# Patient Record
Sex: Female | Born: 1940 | Race: White | Hispanic: No | Smoking: Former smoker
Health system: Southern US, Community
[De-identification: ages and names within clinical notes are randomized; demographics above are authoritative.]

## PROBLEM LIST (undated history)

## (undated) DIAGNOSIS — D696 Thrombocytopenia, unspecified: Secondary | ICD-10-CM

## (undated) DIAGNOSIS — R0989 Other specified symptoms and signs involving the circulatory and respiratory systems: Secondary | ICD-10-CM

## (undated) DIAGNOSIS — B353 Tinea pedis: Secondary | ICD-10-CM

## (undated) DIAGNOSIS — L408 Other psoriasis: Secondary | ICD-10-CM

## (undated) DIAGNOSIS — B354 Tinea corporis: Secondary | ICD-10-CM

## (undated) DIAGNOSIS — R7989 Other specified abnormal findings of blood chemistry: Secondary | ICD-10-CM

## (undated) DIAGNOSIS — J029 Acute pharyngitis, unspecified: Secondary | ICD-10-CM

## (undated) DIAGNOSIS — H9190 Unspecified hearing loss, unspecified ear: Secondary | ICD-10-CM

## (undated) DIAGNOSIS — K5732 Diverticulitis of large intestine without perforation or abscess without bleeding: Secondary | ICD-10-CM

## (undated) DIAGNOSIS — M1990 Unspecified osteoarthritis, unspecified site: Secondary | ICD-10-CM

## (undated) DIAGNOSIS — R1011 Right upper quadrant pain: Secondary | ICD-10-CM

## (undated) DIAGNOSIS — R945 Abnormal results of liver function studies: Secondary | ICD-10-CM

## (undated) DIAGNOSIS — M7660 Achilles tendinitis, unspecified leg: Secondary | ICD-10-CM

## (undated) DIAGNOSIS — E119 Type 2 diabetes mellitus without complications: Secondary | ICD-10-CM

## (undated) DIAGNOSIS — J0100 Acute maxillary sinusitis, unspecified: Secondary | ICD-10-CM

## (undated) DIAGNOSIS — IMO0002 Reserved for concepts with insufficient information to code with codable children: Secondary | ICD-10-CM

## (undated) DIAGNOSIS — I6529 Occlusion and stenosis of unspecified carotid artery: Secondary | ICD-10-CM

## (undated) DIAGNOSIS — E7800 Pure hypercholesterolemia, unspecified: Secondary | ICD-10-CM

## (undated) DIAGNOSIS — M62830 Muscle spasm of back: Secondary | ICD-10-CM

## (undated) DIAGNOSIS — M75100 Unspecified rotator cuff tear or rupture of unspecified shoulder, not specified as traumatic: Secondary | ICD-10-CM

## (undated) DIAGNOSIS — R51 Headache: Secondary | ICD-10-CM

## (undated) DIAGNOSIS — J329 Chronic sinusitis, unspecified: Secondary | ICD-10-CM

## (undated) DIAGNOSIS — I10 Essential (primary) hypertension: Secondary | ICD-10-CM

## (undated) DIAGNOSIS — R0609 Other forms of dyspnea: Secondary | ICD-10-CM

## (undated) DIAGNOSIS — R609 Edema, unspecified: Secondary | ICD-10-CM

## (undated) DIAGNOSIS — D649 Anemia, unspecified: Secondary | ICD-10-CM

## (undated) DIAGNOSIS — B372 Candidiasis of skin and nail: Secondary | ICD-10-CM

## (undated) HISTORY — DX: Unspecified rotator cuff tear or rupture of unspecified shoulder, not specified as traumatic: M75.100

## (undated) HISTORY — DX: Chronic sinusitis, unspecified: J32.9

## (undated) HISTORY — DX: Anemia, unspecified: D64.9

## (undated) HISTORY — DX: Acute maxillary sinusitis, unspecified: J01.00

## (undated) HISTORY — DX: Thrombocytopenia, unspecified: D69.6

## (undated) HISTORY — DX: Other specified symptoms and signs involving the circulatory and respiratory systems: R09.89

## (undated) HISTORY — DX: Other forms of dyspnea: R06.09

## (undated) HISTORY — DX: Headache: R51

## (undated) HISTORY — DX: Reserved for concepts with insufficient information to code with codable children: IMO0002

## (undated) HISTORY — DX: Candidiasis of skin and nail: B37.2

## (undated) HISTORY — DX: Unspecified osteoarthritis, unspecified site: M19.90

## (undated) HISTORY — DX: Achilles tendinitis, unspecified leg: M76.60

## (undated) HISTORY — DX: Acute pharyngitis, unspecified: J02.9

## (undated) HISTORY — DX: Tinea pedis: B35.3

## (undated) HISTORY — DX: Tinea corporis: B35.4

## (undated) HISTORY — DX: Muscle spasm of back: M62.830

## (undated) HISTORY — PX: TONSILLECTOMY: SUR1361

## (undated) HISTORY — DX: Essential (primary) hypertension: I10

## (undated) HISTORY — DX: Pure hypercholesterolemia, unspecified: E78.00

## (undated) HISTORY — DX: Other psoriasis: L40.8

## (undated) HISTORY — DX: Abnormal results of liver function studies: R94.5

## (undated) HISTORY — DX: Diverticulitis of large intestine without perforation or abscess without bleeding: K57.32

## (undated) HISTORY — DX: Other specified abnormal findings of blood chemistry: R79.89

## (undated) HISTORY — PX: CATARACT EXTRACTION, BILATERAL: SHX1313

## (undated) HISTORY — DX: Edema, unspecified: R60.9

## (undated) HISTORY — DX: Type 2 diabetes mellitus without complications: E11.9

## (undated) HISTORY — DX: Right upper quadrant pain: R10.11

## (undated) HISTORY — DX: Occlusion and stenosis of unspecified carotid artery: I65.29

---

## 1962-02-15 HISTORY — PX: APPENDECTOMY: SHX54

## 1970-02-15 HISTORY — PX: VAGINAL HYSTERECTOMY: SUR661

## 1976-02-16 HISTORY — PX: NOSE SURGERY: SHX723

## 1980-02-16 HISTORY — PX: CHOLECYSTECTOMY: SHX55

## 1990-02-15 HISTORY — PX: PARATHYROIDECTOMY: SHX19

## 2003-02-16 HISTORY — PX: CAROTID ENDARTERECTOMY: SUR193

## 2006-03-04 DIAGNOSIS — IMO0002 Reserved for concepts with insufficient information to code with codable children: Secondary | ICD-10-CM | POA: Insufficient documentation

## 2007-02-16 HISTORY — PX: CAROTID ENDARTERECTOMY: SUR193

## 2007-02-16 HISTORY — PX: COLONOSCOPY: SHX174

## 2007-11-02 ENCOUNTER — Ambulatory Visit: Payer: Self-pay | Admitting: Surgery

## 2007-12-12 IMAGING — CR DG CHEST 2V
2 series · 2 of 2 positions shown · non-contrast
Comparison: 

CLINICAL DATA: Pre admit for carotid stenosis

CHEST - 2 VIEW

[view not recorded (1 of 2)]
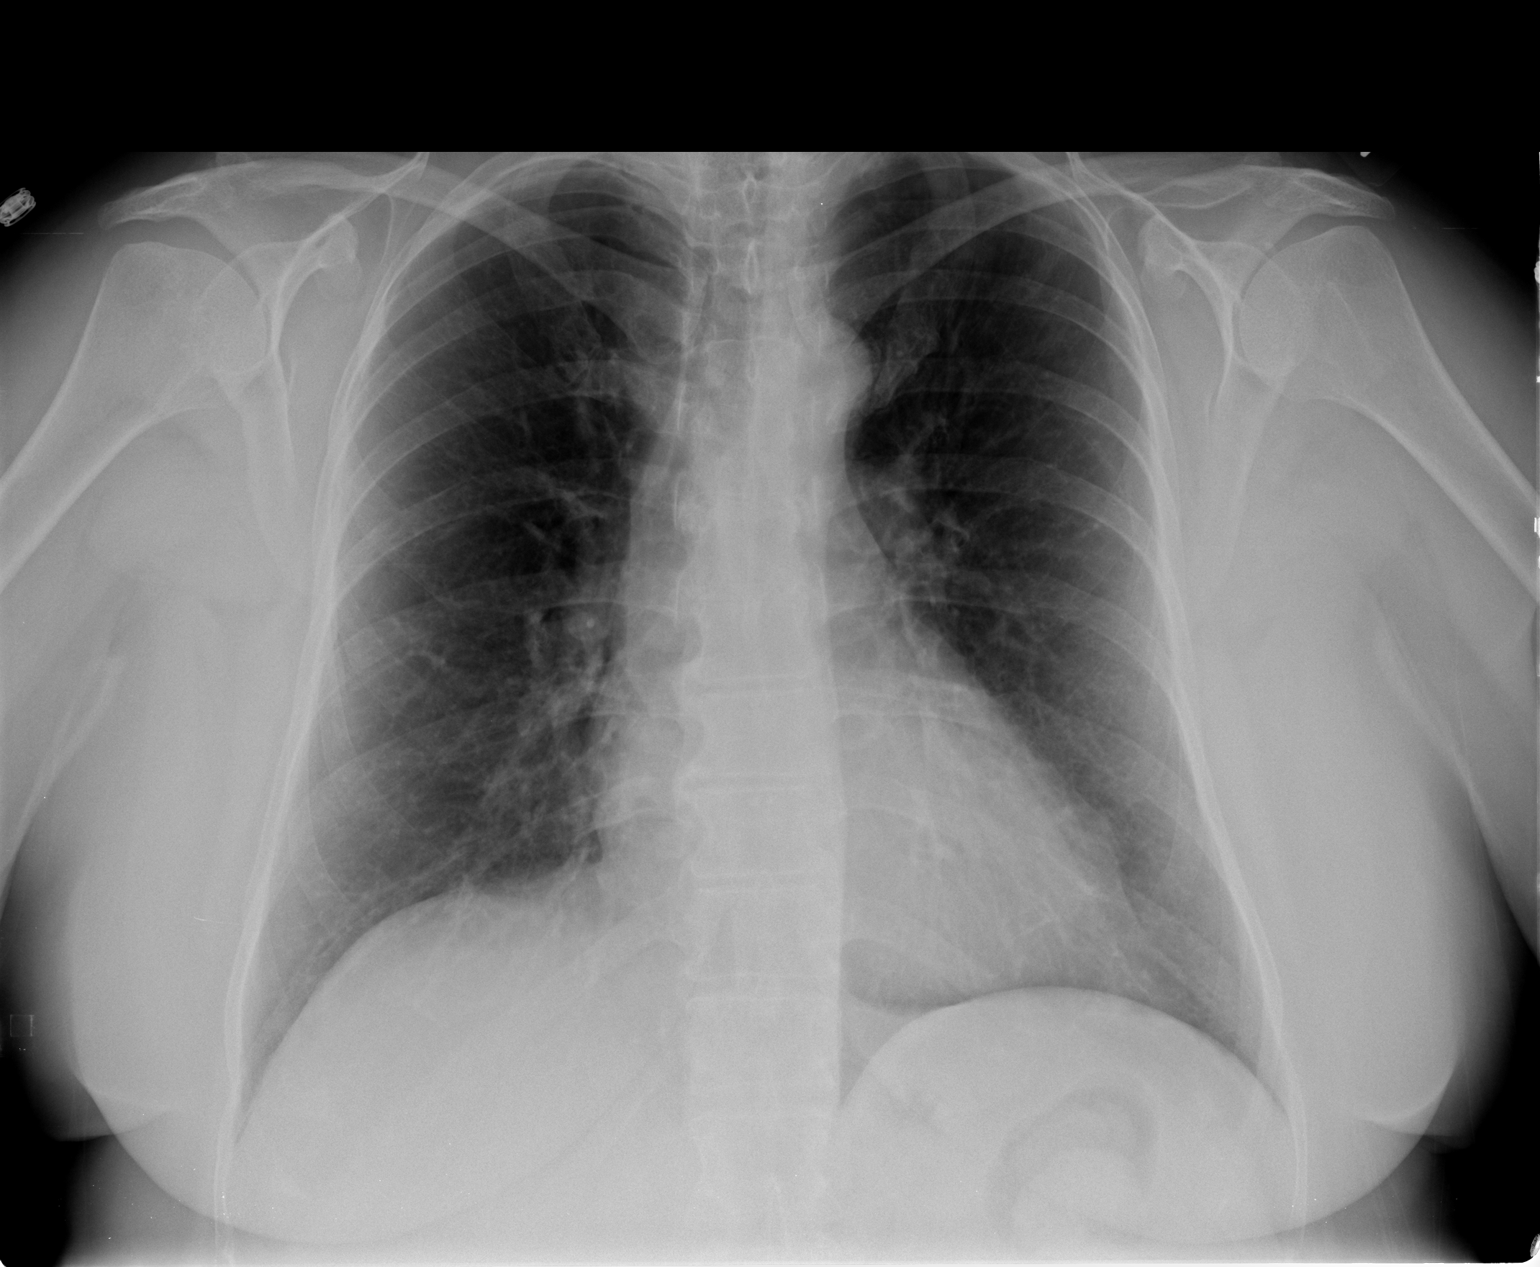

[view not recorded (2 of 2)]
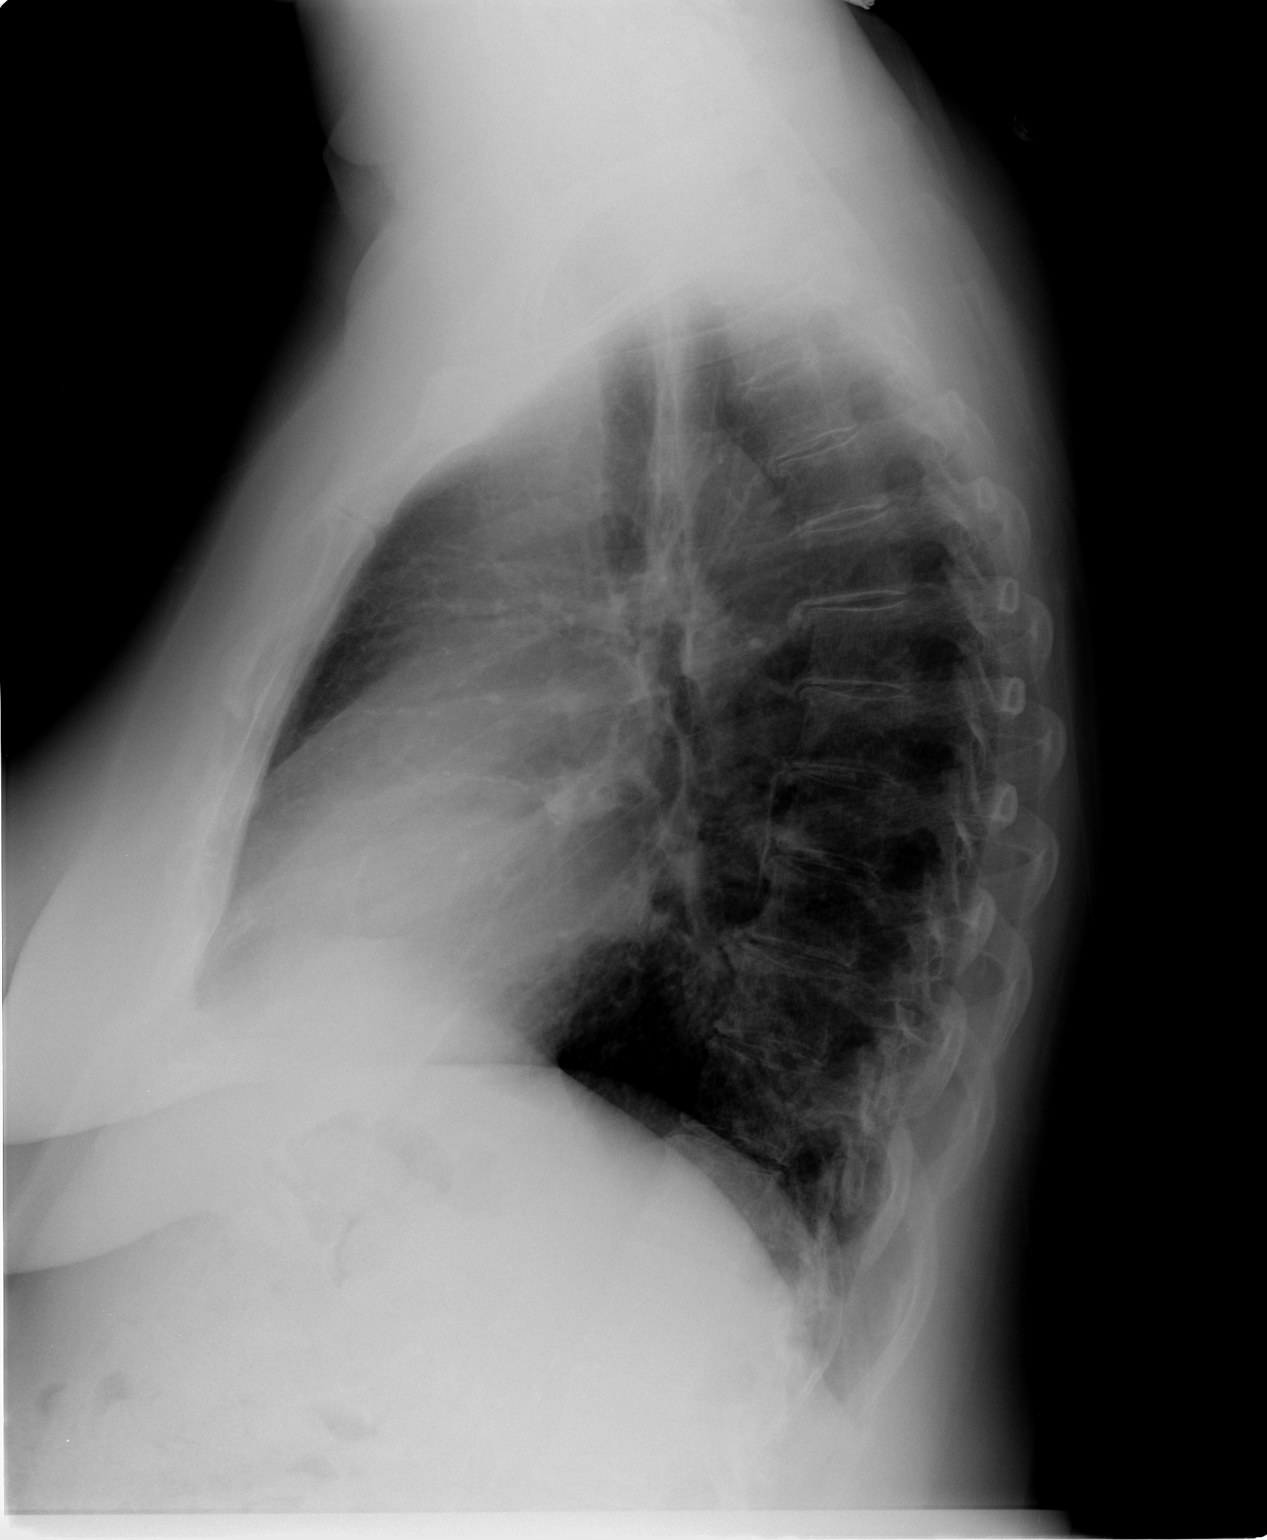

[2 of 2 positions shown; findings below may reference images not displayed]

FINDINGS: Heart size upper normal.  Lungs clear.  Osseous
structures intact with mild degenerative changes of the T-spine.
IMPRESSION: No active disease.

## 2007-12-15 ENCOUNTER — Inpatient Hospital Stay (HOSPITAL_COMMUNITY): Admission: RE | Admit: 2007-12-15 | Discharge: 2007-12-16 | Payer: Self-pay | Admitting: *Deleted

## 2007-12-15 ENCOUNTER — Encounter (INDEPENDENT_AMBULATORY_CARE_PROVIDER_SITE_OTHER): Payer: Self-pay | Admitting: *Deleted

## 2007-12-15 ENCOUNTER — Ambulatory Visit: Payer: Self-pay | Admitting: *Deleted

## 2008-01-04 ENCOUNTER — Ambulatory Visit: Payer: Self-pay | Admitting: *Deleted

## 2008-07-11 ENCOUNTER — Ambulatory Visit: Payer: Self-pay | Admitting: *Deleted

## 2008-12-16 ENCOUNTER — Ambulatory Visit: Payer: Self-pay | Admitting: Surgery

## 2009-03-06 HISTORY — PX: OTHER SURGICAL HISTORY: SHX169

## 2009-03-13 ENCOUNTER — Ambulatory Visit (HOSPITAL_COMMUNITY): Admission: RE | Admit: 2009-03-13 | Discharge: 2009-03-13 | Payer: Self-pay | Admitting: Internal Medicine

## 2009-03-13 IMAGING — XA IR US GUIDE VASC ACCESS RIGHT
1 series · 1 of 1 positions shown · non-contrast
Comparison: none

Clinical Data/Indication: Iron deficiency anemia

TUNNEL POWER PORT PLACEMENT WITH SUBCUTANEOUS POCKET UTILIZING
ULTRASOUND & FLOUROSCOPY
Sedation: Versed 2 mg, Fentanyl 50 mcg.
Total Moderate Sedation Time: 30 minutes.
Fluoroscopy Time: 0.6 minutes.
Procedure:  After written informed consent was obtained, patient
was placed in the supine position on angiographic table. The right
neck and chest was prepped and draped in a sterile fashion.
Lidocaine was utilized for local anesthesia.  The  vein was noted
to be patent initially with ultrasound.  Under sonographic
guidance, a micropuncture needle was inserted into the right IJ
vein (Ultrasound and fluoroscopic image documentation was
performed). The needle was removed over an 018 wire which was
exchanged for a Amplatz.  This was advanced into the IVC.  An 8-
French dilator was advanced over the Amplatz.
A small incision was made in the right upper chest over the
anterior right second rib.  Utilizing blunt dissection, a
subcutaneous pocket was created in the caudal direction. The pocket
was irrigated with a copious amount of sterile normal saline.  The
port catheter was tunneled from the chest incision, and out the
neck incision.  The reservoir was inserted into the subcutaneous
pocket and secured with two 3-0 Ethilon stitches.  A peel-away
sheath was advanced over the Amplatz wire.  The port catheter was
cut to measure length and inserted through the peel-away sheath.
The peel-away sheath was removed.  The chest incision was closed
with 3-0 Vicryl interrupted stitches for the subcutaneous tissue
and a running of 4-0 Vicryl subcuticular stitch for the skin.  The
neck incision was closed with a 4-0 Vicryl subcuticular stitch.
Derma-bond was applied to both surgical incisions.  The port
reservoir was flushed and instilled with heparinized saline.  No
complications.

[Series 1: fl  angio · 1 of 1 slices shown]
[im 1/1]
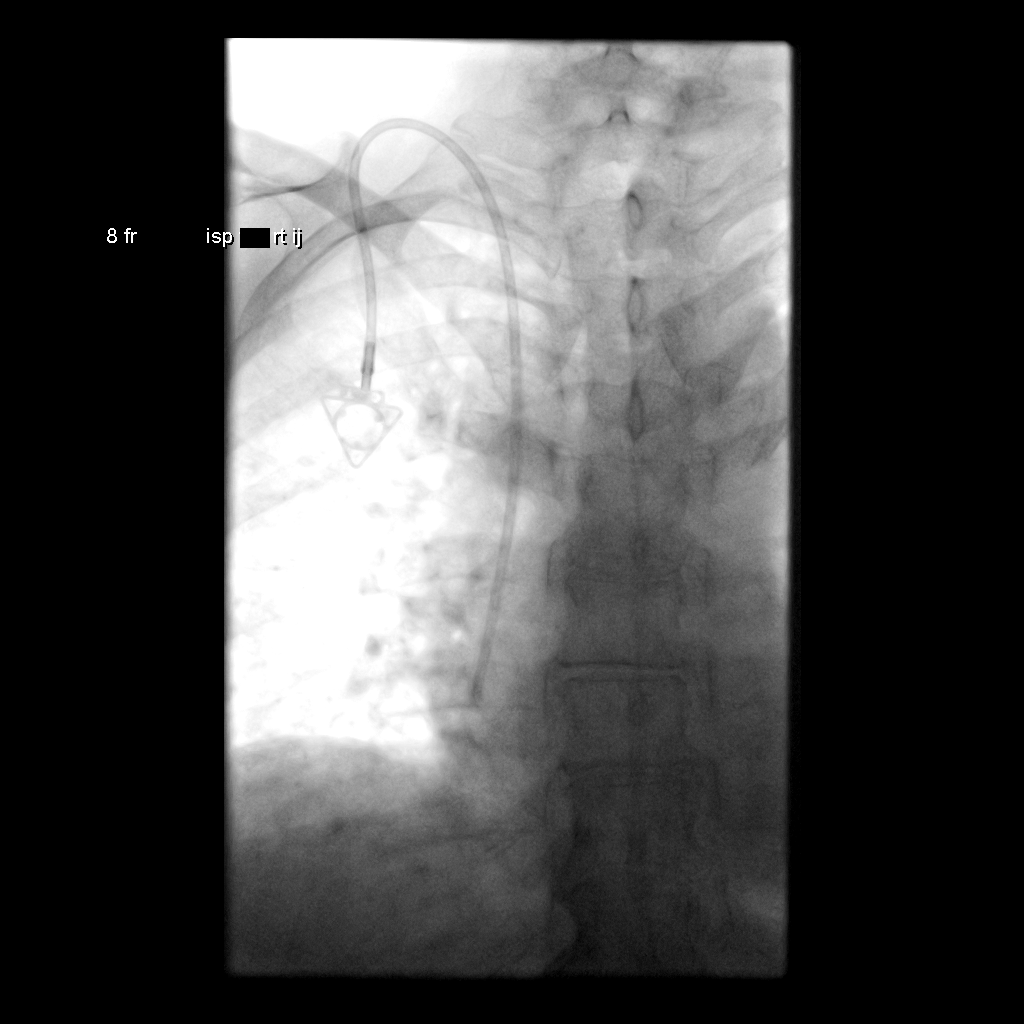

[1 of 1 positions shown; findings below may reference images not displayed]

FINDINGS: A right IJ vein Port-A-Cath is in place with its tip at
the cavoatrial junction.
IMPRESSION: Successful 8 French right internal jugular vein power port
placement with its tip at the SVC/RA junction.

## 2009-08-15 ENCOUNTER — Ambulatory Visit: Payer: Self-pay | Admitting: Vascular Surgery

## 2010-06-30 NOTE — Procedures (Signed)
CAROTID DUPLEX EXAM   INDICATION:  Follow-up evaluation of known carotid artery disease.   HISTORY:  Diabetes:  Yes  Cardiac:  No  Hypertension:  Medically controlled.  Smoking:  No  Previous Surgery:  Right carotid endarterectomy on December 15, 2007, by  Dr. Amedeo Plenty.  Left carotid endarterectomy in April of 2005.  CV History:  The patient had an episode of right eye amaurosis fugax in  September of 2009.  Amaurosis Fugax No, Paresthesias No, Hemiparesis No                                       RIGHT             LEFT  Brachial systolic pressure:         148               156  Brachial Doppler waveforms:         Triphasic         Triphasic  Vertebral direction of flow:        Antegrade         Antegrade  DUPLEX VELOCITIES (cm/sec)  CCA peak systolic                   66                892  ECA peak systolic                   159               119  ICA peak systolic                   79                91  ICA end diastolic                   28                34  PLAQUE MORPHOLOGY:                  None.             Mixed.  PLAQUE AMOUNT:                      None.             Minimal.  PLAQUE LOCATION:                    None.             Distal CCA and  proximal ICA.   IMPRESSION:  1. No right internal carotid artery stenosis status post      endarterectomy.  2. A 20-39% left internal carotid artery stenosis status post      endarterectomy.   ___________________________________________  P. Drucie Opitz, M.D.   MC/MEDQ  D:  07/11/2008  T:  07/11/2008  Job:  [id number]

## 2010-06-30 NOTE — H&P (Signed)
NAMEJULIANNE, Jeanette Chang NO.:  [id number]   MEDICAL RECORD NO.:  [id number]          PATIENT TYPE:  INP   LOCATION:  6712                         FACILITY:  [facility]   PHYSICIAN:  Dorothea Glassman, M.D.    DATE OF BIRTH:  12-23-1940   DATE OF ADMISSION:  12/15/2007  DATE OF DISCHARGE:                              HISTORY & PHYSICAL   ADMITTING PHYSICIAN:  Dorothea Glassman, MD   PRIMARY CARE PHYSICIAN:  Monico Blitz, MD, [place], [place].   ADMISSION DIAGNOSIS:  Severe right internal carotid artery stenosis.   HISTORY:  Jeanette Chang is a 70 year old female who underwent left  carotid endarterectomy in [place], [place], in 2005.  She recently  suffered an episode of right eye visual disturbance approximately 6  weeks ago, lasting for approximately 10 minutes.  She underwent a  carotid Doppler in [place] revealing a severe right internal carotid artery  stenosis.  Repeat Doppler in the [facility] office verified velocities of  316/104 cm/sec.  No history of documented stroke.  No further visual  disturbance.  No sensory or motor deficit.  She is on aspirin 81 mg  daily.   Risk factors for atherosclerotic disease include type 2 diabetes,  hypertension, hyperlipidemia, and family history.  She has a history of  smoking and having discontinued in 1994.   PAST MEDICAL HISTORY:  1. Type 2 diabetes.  2. Hypertension.  3. Hyperlipidemia.  4. Left carotid endarterectomy.   MEDICATIONS:  1. Metformin HCl 1000 mg b.i.d.  2. Zetia 10 mg daily.  3. Glimepiride 4 mg daily.  4. Aspirin 81 mg daily.  5. Atenolol 50 mg daily.  6. Lotrel 5/40 one capsule daily.  7. Fexofenadine 180 mg p.r.n.   SOCIAL HISTORY:  The patient is married.  She has 5 children. who are  all now grown.  She is a retired [employer] manager.  Does not smoke at present.  Discontinued tobacco use in 1994.  No regular alcohol use.   REVIEW OF SYSTEMS:  The patient does note occasional headaches and  arthritic joint  discomfort.   FAMILY HISTORY:  Mother died at age 85 of an MI.  Father died at age 63  of an MI, he had a history of hypertension and morbid obesity.   PHYSICAL EXAMINATION:  GENERAL:  Well-appearing 70 year old female.  Moderate obesity.  Alert and oriented in no distress.  VITAL SIGNS:  BP 120/70, pulse 80 per minute, respirations are 18 per  minute.  HEENT:  Unremarkable.  NECK:  Supple.  No thyromegaly or adenopathy.  CHEST:  Equal air entry bilaterally.  Distant breath sounds.  No rales  or rhonchi.  Clear to percussion.  CARDIOVASCULAR:  No carotid bruits.  Normal heart sounds without  murmurs.  Regular rate and rhythm.  No gallops or rubs.  ABDOMEN:  Soft, nontender.  No masses, organomegaly.  Normal bowel  sounds without bruits.  EXTREMITIES:  No ankle edema.  Full range of motion.  NEUROLOGIC:  Cranial nerves intact.  Strength equal bilaterally.  1+  reflexes.  Normal gait.  Normal sensation.  SKIN:  No rash or ulceration.   IMPRESSION:  1. Moderate-to-severe right internal carotid artery stenosis with      recent symptoms of amaurosis fugax.  2. Hypertension.  3. Hyperlipidemia.  4. Type 2 diabetes.   RECOMMENDATION:  The patient is scheduled to undergo right carotid  endarterectomy on December 15, 2007, at [facility].  Aspirin  increased to 325 mg daily.   Chronic medical conditions including diabetes, hypertension,  hyperlipidemia, well controlled.      Dorothea Glassman, M.D.  Electronically Signed     PGH/MEDQ  D:  12/15/2007  T:  12/16/2007  Job:  [id number]   cc:   Monico Blitz, MD

## 2010-06-30 NOTE — Procedures (Signed)
CAROTID DUPLEX EXAM   INDICATION:  Followup known carotid disease.   HISTORY:  Diabetes:  Yes.  Cardiac:  No.  Hypertension:  Yes.  Smoking:  Previously.  Previous Surgery:  Right carotid endarterectomy 11/2007, left carotid  endarterectomy 05/2003.  CV History:  Asymptomatic.  Amaurosis Fugax No, Paresthesias No, Hemiparesis No                                       RIGHT             LEFT  Brachial systolic pressure:         200               192  Brachial Doppler waveforms:         Normal            Normal  Vertebral direction of flow:        Antegrade         Antegrade  DUPLEX VELOCITIES (cm/sec)  CCA peak systolic                   92                92  ECA peak systolic                   94                037  ICA peak systolic                   79                72  ICA end diastolic                   18                25  PLAQUE MORPHOLOGY:  PLAQUE AMOUNT:                      None              None  PLAQUE LOCATION:   IMPRESSION:  1. Patent right and left carotids post carotid endarterectomy with no      evidence of narrowing or restenosis.  2. Antegrade flow in bilateral vertebral arteries.   ___________________________________________  Rosetta Posner, M.D.   NT/MEDQ  D:  08/15/2009  T:  08/15/2009  Job:  [id number]

## 2010-06-30 NOTE — Consult Note (Signed)
VASCULAR SURGERY CONSULTATION   Jeanette Chang, Jeanette Chang  DOB:  [birth year]                                       11/02/2007  CHART#:[id number]   REFERRING PHYSICIAN:  Monico Blitz, MD, [place], [place].   REFERRAL DIAGNOSIS:  Severe right internal carotid artery stenosis.   HISTORY:  The patient is a 70 year old female who underwent left carotid  endarterectomy in [place], [place], in 2005.  She suffered an  episode of right eye visual disturbance approximately 1 week ago lasting  less than 10 minutes.  She underwent a carotid Doppler evaluation at  [facility] revealing a severe right internal carotid artery  stenosis.  Repeat Doppler in this office verifies velocities of 316/104  cm/sec with moderate to severe right internal carotid artery plaque.  No  history of documented stroke.  No further visual disturbance.  She is on  aspirin 81 mg daily.   Risk factors for atherosclerotic vascular disease include type 2  diabetes, hypertension, hyperlipidemia and family history.  She also has  a history of smoking having discontinued in 1994.   PAST MEDICAL HISTORY:  1. Type 2 diabetes.  2. Hypertension.  3. Hyperlipidemia.  4. Status post left carotid endarterectomy.   MEDICATIONS:  1. Metformin HCl 1000 mg b.i.d.  2. Zetia 10 mg daily.  3. Glimepiride 4 mg daily.  4. Aspirin 81 mg daily.  5. Atenolol 50 mg daily.  6. Lotrel 5/40 one capsule daily.  7. Fexofenadine 180 mg p.r.n.   SOCIAL HISTORY:  The patient is married.  She has five children who are  grown.  She is a retired [employer] manager.  Does not smoke at present.  Discontinued  tobacco use in 1994.  No regular alcohol use.   FAMILY HISTORY:  Mother died, age 20, of an MI.  Father died, age 52, of  an MI, he had a history of hypertension and morbid obesity.   REVIEW OF SYSTEMS:  Refer to patient encounter form.  The patient does  note occasional headaches and arthritic joint discomfort.   PHYSICAL  EXAM:  General:  A well-appearing 70 year old female.  No acute  distress, alert and oriented.  Vital signs:  BP is 125/78 left arm,  133/84 right arm, heart rate is 79 per minute and regular, respirations  are 18 per minute.  HEENT:  Mouth and throat are clear.  Normocephalic.  Neck:  Supple.  No thyromegaly or adenopathy.  Chest:  Equal entry  bilaterally with distant breath sounds.  No rales or rhonchi.  Clear to  percussion.  Cardiovascular:  No carotid bruits.  Normal heart sounds  without murmurs.  Regular rate and rhythm.  No gallops or rubs.  Abdomen:  Soft, nontender.  No masses or organomegaly.  Normal bowel  sounds without bruits.  Extremities:  No ankle edema.  Full range of  motion.  Neurological:  Cranial nerves intact.  Strength equal  bilaterally.  1+ reflexes.  Normal gait.  Normal sensation.  Skin:  No  rash or ulceration.   IMPRESSION:  1. Moderate to severe right internal carotid artery stenosis with      recent symptoms of amaurosis fugax.  2. Hypertension.  3. Hyperlipidemia.  4. Type 2 diabetes.   RECOMMENDATIONS:  The patient will be scheduled to undergo right carotid  endarterectomy for reduction of further stroke risk.  She will increase  aspirin to 325 mg daily.   Chronic medical conditions including diabetes, hypertension and  hyperlipidemia well-controlled.   Dorothea Glassman, M.D.  Electronically Signed  PGH/MEDQ  D:  11/02/2007  T:  11/03/2007  Job:  [id number]

## 2010-06-30 NOTE — Assessment & Plan Note (Signed)
OFFICE VISIT   Jeanette Chang, Jeanette Chang  DOB:  10-May-1940                                       08/15/2009  CHART#:[id number]   This is a former patient of Dr. Amedeo Plenty.   CHIEF COMPLAINT:  Followup bilateral carotid endarterectomies.   HISTORY OF PRESENT ILLNESS:  The patient is a 70 year old woman who had  a left carotid endarterectomy in April of 2005 in [place] and a right  carotid endarterectomy in October of 2009 by Dr. Amedeo Plenty.  She states that  at the time of the left carotid she had left facial droop and had some  difficulty with her speech.  She is right hand dominant.  She has not  had any issues of TIA, amaurosis, facial droop or speech difficulties  since both carotids have been done and she had full recovery after her  left carotid endarterectomy.  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   PAST MEDICAL HISTORY:  She has a history of diabetes, high blood  pressure and high cholesterol.  She has some difficulty with anemia.  Otherwise her history is unchanged.   Her medications were reviewed today.   Allergies are to penicillin, codeine.  She was also taken off of  lovastatin for increases in her LFTs.   Vascular labs showed ICA peak systolic over end diastolic volumes of  02/40 and 72/25 right and left respectively.   PHYSICAL EXAM:  This is a pleasant well-developed, well-nourished woman  in no acute distress.  She is alert and oriented times 3.  Heart rate is  67, blood pressure 168/92 and sats were 98%.  Neurologically she was  intact.  She is alert and oriented times 3.  She had good and equal  strength of bilateral upper and lower extremities.  She had no facial  droop and no tongue deviation.   ASSESSMENT:  Stable status post right bilateral carotid endarterectomies  with low end-diastolic velocities and no symptoms.   PLAN:  Follow up in 1 year with carotid studies.   Wray Kearns, PA-C   Palmer. Scot Dock, M.D.  Electronically Signed   RR/MEDQ  D:  08/15/2009  T:  08/15/2009  Job:  [id number]

## 2010-06-30 NOTE — Procedures (Signed)
CAROTID DUPLEX EXAM   INDICATION:  Follow up of carotid disease.   HISTORY:  Diabetes:  Yes  Cardiac:  No  Hypertension:  Yes  Smoking:  No  Previous Surgery:  Right carotid endarterectomy October 2009, left  carotid endarterectomy April 2005  CV History:  Amaurosis Fugax No, Paresthesias No, Hemiparesis No                                       RIGHT             LEFT  Brachial systolic pressure:         142               139  Brachial Doppler waveforms:         Triphasic         Triphasic  Vertebral direction of flow:        Antegrade         Antegrade  DUPLEX VELOCITIES (cm/sec)  CCA peak systolic                   61                30  ECA peak systolic                   134               78  ICA peak systolic                   43                37  ICA end diastolic                   0.9               13  PLAQUE MORPHOLOGY:                  None              Mixed  PLAQUE AMOUNT:                      None              Mild  PLAQUE LOCATION:                    None              ICA   IMPRESSION:  A 20% to 39% stenosis in the left internal carotid artery.   ___________________________________________  V. Leia Alf, MD   CJ/MEDQ  D:  12/16/2008  T:  12/17/2008  Job:  [id number]

## 2010-06-30 NOTE — Procedures (Signed)
CAROTID DUPLEX EXAM   INDICATION:  Follow-up evaluation of known carotid artery disease.   HISTORY:  Diabetes:  Yes.  Cardiac:  No.  Hypertension:  Medically controlled.  Smoking:  No.  Previous Surgery:  Dr. Autumn Patty in [place] performed a left  carotid endarterectomy on the patient in April, 2005.  CV History:  Right eye amaurosis fugax one week ago, which lasted for  less than 10 minutes.  Amaurosis Fugax Yes, Paresthesias No, Hemiparesis No.                                       RIGHT             LEFT  Brachial systolic pressure:         150               154  Brachial Doppler waveforms:         Triphasic         Triphasic  Vertebral direction of flow:        Antegrade  DUPLEX VELOCITIES (cm/sec)  CCA peak systolic                   98  ECA peak systolic                   017  ICA peak systolic                   510  ICA end diastolic                   104  PLAQUE MORPHOLOGY:                  Calcified  PLAQUE AMOUNT:                      Moderate-to-severe  PLAQUE LOCATION:                    Proximal ICA   IMPRESSION:  1. Distal right internal carotid artery peak systolic velocity 52      cm/s, end-diastolic velocity 10 cm/s.  2. 60-79% right internal carotid artery stenosis (high end of range).   ___________________________________________  P. Drucie Opitz, M.D.   MC/MEDQ  D:  11/02/2007  T:  11/02/2007  Job:  [id number]

## 2010-06-30 NOTE — Op Note (Signed)
NAMERACQUELLE, HYSER              ACCOUNT NO.:  [id number]   MEDICAL RECORD NO.:  [id number]          PATIENT TYPE:  INP   LOCATION:  8756                         FACILITY:  [facility]   PHYSICIAN:  Dorothea Glassman, M.D.    DATE OF BIRTH:  1940/09/09   DATE OF PROCEDURE:  12/15/2007  DATE OF DISCHARGE:                               OPERATIVE REPORT   SURGEON:  Dorothea Glassman, MD   ASSISTANT:  Jacinta Shoe, PA   ANESTHETIC:  General endotracheal.   PREOPERATIVE DIAGNOSIS:  Severe right internal carotid artery stenosis.   POSTOPERATIVE DIAGNOSIS:  Severe right internal carotid artery stenosis.   PROCEDURES:  Right carotid endarterectomy and Dacron patch angioplasty.   CLINICAL NOTE:  Jeanette Chang is a 70 year old female with history of  left carotid endarterectomy carried out in [place].  She has now  relocated to [facility] and recent Doppler evaluation revealed a severe  right internal carotid artery stenosis.  Brought to the operating room  at this time for right carotid endarterectomy.  Risks of the operative  procedure reviewed with the patient, major bleeding and mortality of 1-  2%.  Complications include, but are not limited to MI, CVA, cranial  nerve injury, and death.   OPERATIVE PROCEDURE:  The patient was brought to the operating room in  stable condition.  Placed under general endotracheal anesthesia.  Foley  catheter arterial line in place.  Right neck prepped and draped in a  sterile fashion.   Curvilinear skin incision was made along the anterior border of the  right sternomastoid muscle.  Subcutaneous tissue divided with  electrocautery.  Platysma divided.  Deep dissection carried along the  anterior border of sternomastoid to expose the carotid vessels.  The  carotid bifurcation exposed.  Facial vein ligated with 2-0 silk and  divided.  The common carotid artery mobilized down the omohyoid muscle  and encircled with vessel loop.  The origin of the superior  thyroid and  external carotid were freed encircled with vessel loops.  The internal  carotid artery followed distally up to the posterior belly of the  digastric muscle.  The hypoglossal nerve identified, retracted, and  preserved.  The distal internal carotid artery encircled with a vessel  loop.   Evaluation of the carotid bifurcation revealed a plaque in the bulb  extending into the origin of the right internal carotid artery.  The  vessels were noted to be relatively small in caliber.   The patient was administered 7000 units of heparin intravenously.  Adequate circulation time permitted.  The carotid vessels controlled  with clamps.  Longitudinal arteriotomy made in the distal common carotid  artery.  The arteriotomy extended across the carotid bulb and up into  the internal carotid artery.  A large bulky plaque was present in the  bulb and this was resulting in a severe stenosis of the origin of the  right internal carotid artery.  Shunt was inserted.   The endarterectomy elevator used to remove the plaque.  The plaque  raised down and the common carotid artery was divided transversely with  L[place]  scissors.  The superior thyroid and external carotid were  endarterectomized using an eversion technique.  The distal internal  carotid artery plaque feathered out well.  Fragments of plaque removed  with fine forceps.  Site irrigated with heparin-saline solution.   A patch angioplasty endarterectomy site was then carried out with a  Finesse Dacron patch using running 6-0 Prolene suture.  At completion of  this, the shunt was removed.  All vessels were well flushed.  Initial  antegrade flow directed up the external carotid artery, the internal  carotid artery then released.  Excellent pulse and Doppler signal into  the distal internal carotid artery.   The patient tolerated the procedure well.  There were no apparent  complications.  The patient administered 50 mg of protamine   intravenously.  Adequate hemostasis was obtained.  Sponges and  instrument counts were correct.   Sternomastoid fascia closed with running 2-0 Vicryl suture.  Platysma  closed with running 3-0 Vicryl suture.  Skin closed with 4-0 Monocryl.  Dermabond applied.   Anesthesia reversed in the operating room.  The patient awakened  readily.  Neurologically intact.  Transferred to recovery room in stable  condition.      Dorothea Glassman, M.D.  Electronically Signed     PGH/MEDQ  D:  12/15/2007  T:  12/16/2007  Job:  [id number]

## 2010-06-30 NOTE — Assessment & Plan Note (Signed)
OFFICE VISIT   Jeanette, Chang  DOB:  11/16/1940                                       01/04/2008  CHART#:[id number]   The patient underwent right carotid endarterectomy for severe stenosis  December 15, 2007, at [facility].  Since discharge, no major  complaints.  Right neck incision healing well.  BP is 136/81, pulse 76,  respirations 16, temperature 97.6.  Cranial nerves intact.  Strength  equal bilaterally.  No carotid bruits.   The patient doing well following surgery.  We will plan follow-up in 6  months with carotid Doppler evaluation.   Dorothea Glassman, M.D.  Electronically Signed   PGH/MEDQ  D:  01/04/2008  T:  01/05/2008  Job:  [id number]   cc:   Monico Blitz, MD

## 2010-08-18 ENCOUNTER — Other Ambulatory Visit (INDEPENDENT_AMBULATORY_CARE_PROVIDER_SITE_OTHER): Payer: Medicare Other

## 2010-08-18 DIAGNOSIS — I6529 Occlusion and stenosis of unspecified carotid artery: Secondary | ICD-10-CM

## 2010-08-18 DIAGNOSIS — Z48812 Encounter for surgical aftercare following surgery on the circulatory system: Secondary | ICD-10-CM

## 2010-08-27 NOTE — Procedures (Unsigned)
CAROTID DUPLEX EXAM  INDICATION:  Bilateral carotid endarterectomies.  HISTORY: Diabetes:  Yes. Cardiac:  No. Hypertension:  Yes. Smoking:  Previous. Previous Surgery:  Right carotid endarterectomy on 12/15/2007, left carotid endarterectomy in April 2005 in [place]. CV History:  Currently asymptomatic. Amaurosis Fugax No, Paresthesias No, Hemiparesis No.                                      RIGHT             LEFT Brachial systolic pressure:         142               144 Brachial Doppler waveforms:         Normal            Normal Vertebral direction of flow:        Antegrade         Antegrade DUPLEX VELOCITIES (cm/sec) CCA peak systolic                   87                92 ECA peak systolic                   101               240 ICA peak systolic                   99                67 ICA end diastolic                   19                18 PLAQUE MORPHOLOGY:                  Heterogenous PLAQUE AMOUNT:                      Mild              None PLAQUE LOCATION:                    CCA  IMPRESSION: 1. Patent bilateral carotid endarterectomy sites with no     hemodynamically significant stenosis of the bilateral internal     carotid arteries. 2. No significant change noted when compared to the previous     examination on 08/15/2009.  ___________________________________________ Rosetta Posner, M.D.  CH/MEDQ  D:  08/20/2010  T:  08/20/2010  Job:  [id number]

## 2010-09-24 ENCOUNTER — Encounter (INDEPENDENT_AMBULATORY_CARE_PROVIDER_SITE_OTHER): Payer: Self-pay

## 2010-09-24 ENCOUNTER — Encounter (INDEPENDENT_AMBULATORY_CARE_PROVIDER_SITE_OTHER): Payer: Self-pay | Admitting: *Deleted

## 2010-10-29 ENCOUNTER — Ambulatory Visit (INDEPENDENT_AMBULATORY_CARE_PROVIDER_SITE_OTHER): Payer: Medicare Other | Admitting: Internal Medicine

## 2010-11-17 LAB — COMPREHENSIVE METABOLIC PANEL
ALT: 47 — ABNORMAL HIGH
Albumin: 4.2
Calcium: 9.7
Chloride: 107
Creatinine, Ser: 0.81
GFR calc Af Amer: >60
Glucose, Bld: 69 — ABNORMAL LOW
Potassium: 4.3

## 2010-11-17 LAB — GLUCOSE, CAPILLARY
Glucose-Capillary: 188 — ABNORMAL HIGH
Glucose-Capillary: 97
Glucose-Capillary: 99

## 2010-11-17 LAB — URINALYSIS, ROUTINE W REFLEX MICROSCOPIC
Glucose, UA: NEGATIVE
Hgb urine dipstick: NEGATIVE
Nitrite: NEGATIVE
Protein, ur: NEGATIVE
Specific Gravity, Urine: 1.02
pH: 5.5

## 2010-11-17 LAB — CBC
HCT: 29.2 — ABNORMAL LOW
Hemoglobin: 9.8 — ABNORMAL LOW
MCHC: 32.6
MCV: 87.7
Platelets: 119 — ABNORMAL LOW
Platelets: 68 — ABNORMAL LOW
RBC: 4.64
RDW: 16.5 — ABNORMAL HIGH
WBC: 6.1

## 2010-11-17 LAB — BASIC METABOLIC PANEL
Calcium: 8.2 — ABNORMAL LOW
Creatinine, Ser: 0.78
GFR calc Af Amer: >60
GFR calc non Af Amer: >60
Glucose, Bld: 129 — ABNORMAL HIGH
Sodium: 140

## 2010-11-17 LAB — PROTIME-INR: INR: 1

## 2011-07-07 ENCOUNTER — Encounter: Payer: Medicare Other | Admitting: Internal Medicine

## 2011-07-07 DIAGNOSIS — D696 Thrombocytopenia, unspecified: Secondary | ICD-10-CM

## 2011-07-07 DIAGNOSIS — Z452 Encounter for adjustment and management of vascular access device: Secondary | ICD-10-CM

## 2011-07-07 DIAGNOSIS — D693 Immune thrombocytopenic purpura: Secondary | ICD-10-CM

## 2011-07-07 DIAGNOSIS — D649 Anemia, unspecified: Secondary | ICD-10-CM

## 2011-08-17 ENCOUNTER — Encounter: Payer: Self-pay | Admitting: Neurosurgery

## 2011-08-17 DIAGNOSIS — Z452 Encounter for adjustment and management of vascular access device: Secondary | ICD-10-CM

## 2011-08-17 DIAGNOSIS — D696 Thrombocytopenia, unspecified: Secondary | ICD-10-CM

## 2011-08-18 ENCOUNTER — Other Ambulatory Visit: Payer: Self-pay | Admitting: *Deleted

## 2011-08-18 ENCOUNTER — Ambulatory Visit (INDEPENDENT_AMBULATORY_CARE_PROVIDER_SITE_OTHER): Payer: Medicare Other | Admitting: Neurosurgery

## 2011-08-18 ENCOUNTER — Ambulatory Visit (INDEPENDENT_AMBULATORY_CARE_PROVIDER_SITE_OTHER): Payer: Medicare Other | Admitting: *Deleted

## 2011-08-18 ENCOUNTER — Encounter: Payer: Self-pay | Admitting: Neurosurgery

## 2011-08-18 VITALS — BP 120/75 | HR 76 | Resp 16 | Ht 63.0 in | Wt 172.5 lb

## 2011-08-18 DIAGNOSIS — I6529 Occlusion and stenosis of unspecified carotid artery: Secondary | ICD-10-CM

## 2011-08-18 DIAGNOSIS — Z48812 Encounter for surgical aftercare following surgery on the circulatory system: Secondary | ICD-10-CM

## 2011-08-18 NOTE — Progress Notes (Signed)
[facility] [place] Carotid Office Note  CC: Annual carotid duplex Referring Physician: Early  History of Present Illness: 71 year old female patient of Dr. Donnetta Hutching who status post right CEA in 2009 and left CEA 2005. The patient reports no signs or symptoms of CVA, TIA, amaurosis fugax or any neural deficit. The patient also denies any new medical diagnoses or recent surgeries.  Past Medical History  Diagnosis Date  . Abnormal LFTs   . RUQ pain   . Thrombocytopenia, unspecified   . Other psoriasis   . Other specified disorders of rotator cuff syndrome of shoulder and allied disorders   . Anemia, unspecified   . 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 Unspecified essential hypertension   . Osteoarthrosis, unspecified whether generalized or localized, unspecified site   . Type II or unspecified type diabetes mellitus without mention of complication, not stated as uncontrolled   . Pure hypercholesterolemia   . Spasm of back muscles   . Diverticulitis of colon (without mention of hemorrhage)   . Dermatophytosis of the body   . Urinary incontinence   . Malaise and fatigue   . Acute pharyngitis   . Pain in joint   . Acute maxillary sinusitis   . Candidiasis of skin and nails   . Other dyspnea and respiratory abnormality   . Unspecified sinusitis (chronic)   . Dermatophytosis of foot   . Edema   . Achilles bursitis or tendinitis   . Headache     ROS: [x]  Positive   [ ]  Denies    General: [ ]  Weight loss, [ ]  Fever, [ ]  chills Neurologic: [ ]  Dizziness, [ ]  Blackouts, [ ]  Seizure [ ]  Stroke, [ ]  "Mini stroke", [ ]  Slurred speech, [ ]  Temporary blindness; [ ]  weakness in arms or legs, [ ]  Hoarseness Cardiac: [ ]  Chest pain/pressure, [ ]  Shortness of breath at rest [ ]  Shortness of breath with exertion, [ ]  Atrial fibrillation or irregular heartbeat Vascular: [ ]  Pain in legs with walking, [ ]  Pain in legs at rest, [ ]   Pain in legs at night,  [ ]  Non-healing ulcer, [ ]  Blood clot in vein/DVT,   Pulmonary: [ ]  Home oxygen, [ ]  Productive cough, [ ]  Coughing up blood, [ ]  Asthma,  [ ]  Wheezing Musculoskeletal:  [ ]  Arthritis, [ ]  Low back pain, [ ]  Joint pain Hematologic: [ ]  Easy Bruising, [ ]  Anemia; [ ]  Hepatitis Gastrointestinal: [ ]  Blood in stool, [ ]  Gastroesophageal Reflux/heartburn, [ ]  Trouble swallowing Urinary: [ ]  chronic Kidney disease, [ ]  on HD - [ ]  MWF or [ ]  TTHS, [ ]  Burning with urination, [ ]  Difficulty urinating Skin: [ ]  Rashes, [ ]  Wounds Psychological: [ ]  Anxiety, [ ]  Depression   Social History History  Substance Use Topics  . Smoking status: Former Smoker    Types: Cigarettes  . Smokeless tobacco: Former Systems developer    Quit date: 12/16/1992  . Alcohol Use: 0.6 oz/week    1 Glasses of wine per week    Family History Family History  Problem Relation Age of Onset  . Heart attack Mother   . Cancer Mother   . Heart attack Father   . Cancer Sister     Allergies  Allergen Reactions  . Actos (Pioglitazone Hydrochloride)   . Amaryl   . Codeine   . Niacin And Related   . Penicillins   .  Red Yeast Rice   . Sanctura (Trospium Chloride)   . Toviaz (Fesoterodine Fumarate)   . Ultram (Tramadol Hcl)     Current Outpatient Prescriptions  Medication Sig Dispense Refill  . amLODipine (NORVASC) 2.5 MG tablet Take 2.5 mg by mouth daily.      Marland Kitchen aspirin 81 MG tablet Take 81 mg by mouth daily.        Marland Kitchen atenolol (TENORMIN) 50 MG tablet Take 50 mg by mouth daily.        . clobetasol ointment (TEMOVATE) 0.05 % Apply 0.05 % topically as needed.      . doxepin (SINEQUAN) 10 MG capsule Take 10 mg by mouth at bedtime.      Marland Kitchen glimepiride (AMARYL) 4 MG tablet Take 4 mg by mouth daily.      Marland Kitchen lisinopril (PRINIVIL,ZESTRIL) 20 MG tablet Take 20 mg by mouth daily.        . metFORMIN (GLUCOPHAGE) 500 MG tablet Take 500 mg by mouth 2 (two) times daily with a meal.        . Omega-3 Fat  Ac-Cholecalciferol (MINICAPS VITAMIN-D/OMEGA-3) [phone] MG-UNIT CAPS Take 1,000 mg by mouth daily.      Marland Kitchen triamcinolone cream (KENALOG) 0.1 % Apply 0.1 % topically Twice daily.      . fish oil-omega-3 fatty acids 1000 MG capsule Take 2 g by mouth daily.        . Misc Natural Products (OSTEO BI-FLEX ADV DOUBLE ST PO) Take by mouth.        Marland Kitchen omeprazole (PRILOSEC) 20 MG capsule Take 20 mg by mouth daily.        . pantoprazole (PROTONIX) 40 MG tablet Take 40 mg by mouth daily.          Physical Examination  Filed Vitals:   08/18/11 1339  BP: 120/75  Pulse: 76  Resp:     Body mass index is 30.56 kg/(m^2).  General:  WDWN in NAD Gait: Normal HEENT: WNL Eyes: Pupils equal Pulmonary: normal non-labored breathing , without Rales, rhonchi,  wheezing Cardiac: RRR, without  Murmurs, rubs or gallops; Abdomen: soft, NT, no masses Skin: no rashes, ulcers noted  Vascular Exam Pulses: 3+ radial pulses bilaterally Carotid bruits: Carotid pulses to auscultation no bruits are heard Extremities without ischemic changes, no Gangrene , no cellulitis; no open wounds;  Musculoskeletal: no muscle wasting or atrophy   Neurologic: A&O X 3; Appropriate Affect ; SENSATION: normal; MOTOR FUNCTION:  moving all extremities equally. Speech is fluent/normal  Non-Invasive Vascular Imaging CAROTID DUPLEX 08/18/2011  Right ICA 0 - 19% stenosis Left ICA 0 - 19% stenosis   ASSESSMENT/PLAN: Asymptomatic patient status post bilateral CEA with minimal residual plaque. The patient will followup here in one year with repeat carotid duplex, her questions were encouraged and answered, she is in agreement with this plan. Everlean Patterson ANP   Clinic MD: Bridgett Larsson on call

## 2011-08-31 NOTE — Procedures (Unsigned)
CAROTID DUPLEX EXAM  INDICATION:  Follow up bilateral CEA.  HISTORY: Diabetes:  Yes. Cardiac:  No. Hypertension:  Yes. Smoking:  Previous. Previous Surgery:  Right CEA 11/2007; left CEA 05/2003. CV History: Amaurosis Fugax No, Paresthesias No, Hemiparesis No                                      RIGHT             LEFT Brachial systolic pressure:         118               126 Brachial Doppler waveforms:         WNL               WNL Vertebral direction of flow:        Antegrade         Antegrade DUPLEX VELOCITIES (cm/sec) CCA peak systolic                   101               90 ECA peak systolic                   144               93 ICA peak systolic                   78                64 ICA end diastolic                   23                21 PLAQUE MORPHOLOGY:                  Heterogeneous     Heterogeneous PLAQUE AMOUNT:                      Minimal           Minimal PLAQUE LOCATION:                    CEA/ECA           CEA/ECA   IMPRESSION: 1. Patent bilateral carotid endarterectomy with minimal plaque     observed. 2. Bilateral vertebral arteries are within normal limits.        ___________________________________________ Rosetta Posner, M.D.  LT/MEDQ  D:  08/18/2011  T:  08/18/2011  Job:  [id number]

## 2011-09-29 DIAGNOSIS — Z452 Encounter for adjustment and management of vascular access device: Secondary | ICD-10-CM

## 2011-09-29 DIAGNOSIS — D696 Thrombocytopenia, unspecified: Secondary | ICD-10-CM

## 2011-11-03 DIAGNOSIS — Z452 Encounter for adjustment and management of vascular access device: Secondary | ICD-10-CM

## 2011-11-03 DIAGNOSIS — D473 Essential (hemorrhagic) thrombocythemia: Secondary | ICD-10-CM

## 2011-12-22 DIAGNOSIS — D693 Immune thrombocytopenic purpura: Secondary | ICD-10-CM

## 2011-12-22 DIAGNOSIS — D696 Thrombocytopenia, unspecified: Secondary | ICD-10-CM

## 2011-12-22 DIAGNOSIS — D649 Anemia, unspecified: Secondary | ICD-10-CM

## 2011-12-22 DIAGNOSIS — Z452 Encounter for adjustment and management of vascular access device: Secondary | ICD-10-CM

## 2012-02-02 DIAGNOSIS — D696 Thrombocytopenia, unspecified: Secondary | ICD-10-CM

## 2012-02-02 DIAGNOSIS — Z452 Encounter for adjustment and management of vascular access device: Secondary | ICD-10-CM

## 2012-03-09 DIAGNOSIS — D693 Immune thrombocytopenic purpura: Secondary | ICD-10-CM

## 2012-03-09 DIAGNOSIS — Z452 Encounter for adjustment and management of vascular access device: Secondary | ICD-10-CM

## 2012-03-29 DIAGNOSIS — D696 Thrombocytopenia, unspecified: Secondary | ICD-10-CM

## 2012-03-29 DIAGNOSIS — Z452 Encounter for adjustment and management of vascular access device: Secondary | ICD-10-CM

## 2012-04-11 ENCOUNTER — Other Ambulatory Visit: Payer: Self-pay | Admitting: Internal Medicine

## 2012-04-14 ENCOUNTER — Other Ambulatory Visit (HOSPITAL_COMMUNITY): Payer: Self-pay | Admitting: Diagnostic Radiology

## 2012-04-14 ENCOUNTER — Ambulatory Visit
Admission: RE | Admit: 2012-04-14 | Discharge: 2012-04-14 | Disposition: A | Discharge status: Home / Self Care | Payer: Medicare Other | Source: Ambulatory Visit | Attending: Internal Medicine | Admitting: Internal Medicine

## 2012-04-25 DIAGNOSIS — D696 Thrombocytopenia, unspecified: Secondary | ICD-10-CM

## 2012-04-25 DIAGNOSIS — K739 Chronic hepatitis, unspecified: Secondary | ICD-10-CM

## 2012-04-27 ENCOUNTER — Encounter: Payer: Medicare Other | Admitting: Internal Medicine

## 2012-05-10 DIAGNOSIS — Z452 Encounter for adjustment and management of vascular access device: Secondary | ICD-10-CM

## 2012-05-10 DIAGNOSIS — D696 Thrombocytopenia, unspecified: Secondary | ICD-10-CM

## 2012-08-22 ENCOUNTER — Ambulatory Visit: Payer: Medicare Other | Admitting: Neurosurgery

## 2012-08-22 ENCOUNTER — Other Ambulatory Visit (INDEPENDENT_AMBULATORY_CARE_PROVIDER_SITE_OTHER): Payer: Medicare Other | Admitting: *Deleted

## 2012-08-22 DIAGNOSIS — I6529 Occlusion and stenosis of unspecified carotid artery: Secondary | ICD-10-CM

## 2012-08-22 DIAGNOSIS — Z48812 Encounter for surgical aftercare following surgery on the circulatory system: Secondary | ICD-10-CM

## 2012-08-23 ENCOUNTER — Other Ambulatory Visit: Payer: Self-pay | Admitting: *Deleted

## 2012-08-23 DIAGNOSIS — Z48812 Encounter for surgical aftercare following surgery on the circulatory system: Secondary | ICD-10-CM

## 2012-08-24 ENCOUNTER — Encounter: Payer: Self-pay | Admitting: Vascular Surgery

## 2013-08-24 HISTORY — PX: TUMOR EXCISION: SHX421

## 2013-08-28 ENCOUNTER — Other Ambulatory Visit (HOSPITAL_COMMUNITY): Payer: Medicare Other

## 2013-08-28 ENCOUNTER — Ambulatory Visit: Payer: Medicare Other | Admitting: Vascular Surgery

## 2013-09-11 ENCOUNTER — Other Ambulatory Visit (HOSPITAL_COMMUNITY): Payer: Medicare Other

## 2013-09-11 ENCOUNTER — Ambulatory Visit: Payer: Medicare Other | Admitting: Vascular Surgery

## 2013-10-01 ENCOUNTER — Encounter: Payer: Self-pay | Admitting: Vascular Surgery

## 2013-10-01 ENCOUNTER — Other Ambulatory Visit: Payer: Self-pay | Admitting: Vascular Surgery

## 2013-10-01 DIAGNOSIS — I6529 Occlusion and stenosis of unspecified carotid artery: Secondary | ICD-10-CM

## 2013-10-01 DIAGNOSIS — Z48812 Encounter for surgical aftercare following surgery on the circulatory system: Secondary | ICD-10-CM

## 2013-10-02 ENCOUNTER — Encounter: Payer: Self-pay | Admitting: Vascular Surgery

## 2013-10-02 ENCOUNTER — Ambulatory Visit (INDEPENDENT_AMBULATORY_CARE_PROVIDER_SITE_OTHER): Payer: Medicare Other | Admitting: Vascular Surgery

## 2013-10-02 ENCOUNTER — Ambulatory Visit (HOSPITAL_COMMUNITY)
Admission: RE | Admit: 2013-10-02 | Discharge: 2013-10-02 | Disposition: A | Discharge status: Home / Self Care | Payer: Medicare Other | Source: Ambulatory Visit | Attending: Vascular Surgery | Admitting: Vascular Surgery

## 2013-10-02 VITALS — BP 141/50 | HR 66 | Resp 16 | Ht 63.0 in | Wt 160.0 lb

## 2013-10-02 DIAGNOSIS — Z48812 Encounter for surgical aftercare following surgery on the circulatory system: Secondary | ICD-10-CM | POA: Insufficient documentation

## 2013-10-02 DIAGNOSIS — I6529 Occlusion and stenosis of unspecified carotid artery: Secondary | ICD-10-CM

## 2013-10-02 NOTE — Progress Notes (Signed)
Patient name: Jeanette Chang MRN: [id number] DOB: 1940/03/05 Sex: female   Referred by: Manuella Ghazi  Reason for referral:  Chief Complaint  Patient presents with  . Re-evaluation    1 yr  CAROTID  f/u with vascular lab duplex.      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 She is status post left carotid endarterectomy in the [place] and right carotid endarterectomy by Dr. Amedeo Plenty in 2009. She has remained asymptomatic from this. 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 She denies any focal neurologic deficits.  Past Medical History  Diagnosis Date  . Abnormal LFTs   . RUQ pain   . Thrombocytopenia, unspecified   . Other psoriasis   . Other specified disorders of rotator cuff syndrome of shoulder and allied disorders   . Anemia, unspecified   . 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 Unspecified essential hypertension   . Osteoarthrosis, unspecified whether generalized or localized, unspecified site   . Type II or unspecified type diabetes mellitus without mention of complication, not stated as uncontrolled   . Pure hypercholesterolemia   . Spasm of back muscles   . Diverticulitis of colon (without mention of hemorrhage)   . Dermatophytosis of the body   . Urinary incontinence   . Malaise and fatigue   . Acute pharyngitis   . Pain in joint   . Acute maxillary sinusitis   . Candidiasis of skin and nails   . Other dyspnea and respiratory abnormality   . Unspecified sinusitis (chronic)   . Dermatophytosis of foot   . Edema   . Achilles bursitis or tendinitis   . ZYSAYTKZ(601.0)     Past Surgical History  Procedure Laterality Date  . Tonsillectomy    . Parathyroidectomy  1992  . Colonoscopy  09  . Nose surgery  1978  . Appendectomy  1964  . Vaginal hysterectomy  1972  . Cholecystectomy  1982    Gall Bladder    . Carotid endarterectomy  2005    Left CEA  . Carotid endarterectomy  2009    Right CEA  . Power port  03/06/2009    Right upper chest   . Tumor excision Right August 24, 2013    [facility] Dr. Ellen Henri, ENT    History   Social History  . Marital Status: Married    Spouse Name: N/A    Number of Children: N/A  . Years of Education: N/A   Occupational History  . Not on file.   Social History Main Topics  . Smoking status: Former Smoker    Types: Cigarettes  . Smokeless tobacco: Former Systems developer    Quit date: 12/16/1992  . Alcohol Use: 0.6 oz/week    1 Glasses of wine per week  . Drug Use: No  . Sexual Activity: Not on file   Other Topics Concern  . Not on file   Social History Narrative  . No narrative on file    Family History  Problem Relation Age of Onset  . Heart attack Mother   . Cancer Mother   . Heart attack Father   . Cancer Sister     Allergies as of 10/02/2013 - Review Complete 10/02/2013  Allergen Reaction Noted  . Actos [pioglitazone hydrochloride]  09/24/2010  . Amaryl  09/24/2010  . Codeine  09/24/2010  .  Niacin and related  09/24/2010  . Penicillins  09/24/2010  . Red yeast rice  09/24/2010  . Sanctura [trospium chloride]  09/24/2010  . Toviaz [fesoterodine fumarate]  09/24/2010  . Ultram [tramadol hcl]  09/24/2010    Current Outpatient Prescriptions on File Prior to Visit  Medication Sig Dispense Refill  . amLODipine (NORVASC) 2.5 MG tablet Take 2.5 mg by mouth daily.      Marland Kitchen aspirin 81 MG tablet Take 81 mg by mouth daily.        Marland Kitchen atenolol (TENORMIN) 50 MG tablet Take 50 mg by mouth daily.        . clobetasol ointment (TEMOVATE) 0.05 % Apply 0.05 % topically as needed.      . doxepin (SINEQUAN) 10 MG capsule Take 10 mg by mouth at bedtime.      . fish oil-omega-3 fatty acids 1000 MG capsule Take 2 g by mouth daily.        Marland Kitchen glimepiride (AMARYL) 4 MG tablet Take 4 mg by mouth daily.      Marland Kitchen lisinopril (PRINIVIL,ZESTRIL)  20 MG tablet Take 20 mg by mouth daily.        . metFORMIN (GLUCOPHAGE) 500 MG tablet Take 500 mg by mouth 2 (two) times daily with a meal.        . Misc Natural Products (OSTEO BI-FLEX ADV DOUBLE ST PO) Take by mouth.        . Omega-3 Fat Ac-Cholecalciferol (MINICAPS VITAMIN-D/OMEGA-3) [phone] MG-UNIT CAPS Take 1,000 mg by mouth daily.      . pantoprazole (PROTONIX) 40 MG tablet Take 40 mg by mouth daily.        Marland Kitchen triamcinolone cream (KENALOG) 0.1 % Apply 0.1 % topically Twice daily.      Marland Kitchen omeprazole (PRILOSEC) 20 MG capsule Take 20 mg by mouth daily.         No current facility-administered medications on file prior to visit.       PHYSICAL EXAMINATION:  General: The patient is a well-nourished female, in no acute distress. 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  There is no significant extremity pain. Neurologic: No focal weakness or paresthesias are detected, Skin: There are no ulcer or rashes noted. Psychiatric: The patient has normal affect. Cardiovascular: There is a regular rate and rhythm without significant murmur appreciated. Carotid arteries without bruits bilaterally. Well-healed left neck incision. Recent right neck incision tumor removal  [facility] Vascular Lab Studies:  Ordered and Independently Reviewed widely patent endarterectomy bilaterally with no evidence of recurrent stenosis  Impression and Plan:  Stable ultrasound followup after extensive prior carotid surgery. We'll continue usual activity we will see her on a yearly basis repeat carotid duplex. She'll notify should she develop any neurologic deficits    Khayden Herzberg [facility] [place] Office: [phone]

## 2013-10-03 NOTE — Addendum Note (Signed)
Addended by: Mena Goes on: 10/03/2013 09:44 AM   Modules accepted: Orders

## 2014-06-20 DIAGNOSIS — K766 Portal hypertension: Secondary | ICD-10-CM | POA: Insufficient documentation

## 2014-10-08 ENCOUNTER — Encounter (HOSPITAL_COMMUNITY): Payer: Medicare Other

## 2014-10-08 ENCOUNTER — Ambulatory Visit: Payer: Medicare Other | Admitting: Family

## 2014-10-28 ENCOUNTER — Encounter: Payer: Self-pay | Admitting: Family

## 2014-10-29 ENCOUNTER — Encounter: Payer: Self-pay | Admitting: Family

## 2014-10-29 ENCOUNTER — Ambulatory Visit (INDEPENDENT_AMBULATORY_CARE_PROVIDER_SITE_OTHER): Payer: Medicare Other | Admitting: Family

## 2014-10-29 ENCOUNTER — Ambulatory Visit (HOSPITAL_COMMUNITY)
Admission: RE | Admit: 2014-10-29 | Discharge: 2014-10-29 | Disposition: A | Discharge status: Home / Self Care | Payer: Medicare Other | Source: Ambulatory Visit | Attending: Family | Admitting: Family

## 2014-10-29 VITALS — BP 133/69 | HR 60 | Temp 97.4°F | Resp 16 | Ht 63.5 in | Wt 160.0 lb

## 2014-10-29 DIAGNOSIS — Z87891 Personal history of nicotine dependence: Secondary | ICD-10-CM

## 2014-10-29 DIAGNOSIS — I6523 Occlusion and stenosis of bilateral carotid arteries: Secondary | ICD-10-CM | POA: Diagnosis not present

## 2014-10-29 DIAGNOSIS — Z9889 Other specified postprocedural states: Secondary | ICD-10-CM

## 2014-10-29 DIAGNOSIS — Z48812 Encounter for surgical aftercare following surgery on the circulatory system: Secondary | ICD-10-CM | POA: Diagnosis not present

## 2014-10-29 NOTE — Progress Notes (Signed)
Filed Vitals:   10/29/14 1022 10/29/14 1025 10/29/14 1026  BP: 153/63 136/60 133/69  Pulse: 60 60 60  Temp: 97.4 F (36.3 C)    Resp: 16    Height: 5' 3.5" (1.613 m)    Weight: 160 lb (72.576 kg)    SpO2: 98%

## 2014-10-29 NOTE — Patient Instructions (Signed)
Stroke Prevention Some medical conditions and behaviors are associated with an increased chance of having a stroke. You may prevent a stroke by making healthy choices and managing medical conditions. HOW CAN I REDUCE MY RISK OF HAVING A STROKE?   Stay physically active. Get at least 30 minutes of activity on most or all days.  Do not smoke. It may also be helpful to avoid exposure to secondhand smoke.  Limit alcohol use. Moderate alcohol use is considered to be:  No more than 2 drinks per day for men.  No more than 1 drink per day for nonpregnant women.  Eat healthy foods. This involves:  Eating 5 or more servings of fruits and vegetables a day.  Making dietary changes that address high blood pressure (hypertension), high cholesterol, diabetes, or obesity.  Manage your cholesterol levels.  Making food choices that are high in fiber and low in saturated fat, trans fat, and cholesterol may control cholesterol levels.  Take any prescribed medicines to control cholesterol as directed by your health care provider.  Manage your diabetes.  Controlling your carbohydrate and sugar intake is recommended to manage diabetes.  Take any prescribed medicines to control diabetes as directed by your health care provider.  Control your hypertension.  Making food choices that are low in salt (sodium), saturated fat, trans fat, and cholesterol is recommended to manage hypertension.  Take any prescribed medicines to control hypertension as directed by your health care provider.  Maintain a healthy weight.  Reducing calorie intake and making food choices that are low in sodium, saturated fat, trans fat, and cholesterol are recommended to manage weight.  Stop drug abuse.  Avoid taking birth control pills.  Talk to your health care provider about the risks of taking birth control pills if you are over 35 years old, smoke, get migraines, or have ever had a blood clot.  Get evaluated for sleep  disorders (sleep apnea).  Talk to your health care provider about getting a sleep evaluation if you snore a lot or have excessive sleepiness.  Take medicines only as directed by your health care provider.  For some people, aspirin or blood thinners (anticoagulants) are helpful in reducing the risk of forming abnormal blood clots that can lead to stroke. If you have the irregular heart rhythm of atrial fibrillation, you should be on a blood thinner unless there is a good reason you cannot take them.  Understand all your medicine instructions.  Make sure that other conditions (such as anemia or atherosclerosis) are addressed. SEEK IMMEDIATE MEDICAL CARE IF:   You have sudden weakness or numbness of the face, arm, or leg, especially on one side of the body.  Your face or eyelid droops to one side.  You have sudden confusion.  You have trouble speaking (aphasia) or understanding.  You have sudden trouble seeing in one or both eyes.  You have sudden trouble walking.  You have dizziness.  You have a loss of balance or coordination.  You have a sudden, severe headache with no known cause.  You have new chest pain or an irregular heartbeat. Any of these symptoms may represent a serious problem that is an emergency. Do not wait to see if the symptoms will go away. Get medical help at once. Call your local emergency services (911 in [place] Do not drive yourself to the hospital. Document Released: 03/11/2004 Document Revised: 06/18/2013 Document Reviewed: 08/04/2012 [employer] Patient Information 2015 [employer]. This information is not intended to replace advice given   to you by your health care provider. Make sure you discuss any questions you have with your health care provider.  

## 2014-10-29 NOTE — Progress Notes (Signed)
Established Carotid Patient   History of Present Illness  Jeanette Chang is a 74 y.o. female patient of Dr. Donnetta Hutching who is status post left carotid endarterectomy in [place] in 2005, and right carotid endarterectomy by Dr. Amedeo Plenty in 2009. She has remained asymptomatic from this. July of 2015 she had a right neck tumor resection and had a slow recovery from this, she has mild aphasia and partial loss of right eye vision since this which she states is improving.  She states she may have had a TIA years ago as manifested by a headache, but she also reports recurrent right sided headaches. She denies any history of hemiparesis.  She has a history of recurrent benign colon polyps. Her mother had a history of colon cancer. Her main concerns are bursitis in her right shoulder and arthritis in both hips.  Her hips pain limits her walking, she denies claudication type symptoms, denies non healing wounds.   Dr. 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   She also has psoriasis.   The patient denies New Medical or Surgical History.  Pt Diabetic: yes, states her home glucose readings are 70-200, possibly she thinks her last A1C was 7.0 Pt smoker: former smoker, quit in 1994  Pt meds include: Statin : no, statins cause severe headaches and severe weakness ASA: yes Other anticoagulants/antiplatelets: no   Past Medical History  Diagnosis Date  . Abnormal LFTs   . RUQ pain   . Thrombocytopenia, unspecified   . Other psoriasis   . Other specified disorders of rotator cuff syndrome of shoulder and allied disorders   . Anemia, unspecified   . 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 Unspecified essential hypertension   . Osteoarthrosis, unspecified whether generalized or localized, unspecified site   . Type II or unspecified type diabetes mellitus without mention of complication, not stated as uncontrolled   . Pure hypercholesterolemia   . Spasm of  back muscles   . Diverticulitis of colon (without mention of hemorrhage)   . Dermatophytosis of the body   . Urinary incontinence   . Malaise and fatigue   . Acute pharyngitis   . Pain in joint   . Acute maxillary sinusitis   . Candidiasis of skin and nails   . Other dyspnea and respiratory abnormality   . Unspecified sinusitis (chronic)   . Dermatophytosis of foot   . Edema   . Achilles bursitis or tendinitis   . [id number](785.8)     Social History Social History  Substance Use Topics  . Smoking status: Former Smoker    Types: Cigarettes  . Smokeless tobacco: Former Systems developer    Quit date: 12/16/1992  . Alcohol Use: 0.6 oz/week    1 Glasses of wine per week    Family History Family History  Problem Relation Age of Onset  . Heart attack Mother   . Cancer Mother   . Heart attack Father   . Cancer Sister     Surgical History Past Surgical History  Procedure Laterality Date  . Tonsillectomy    . Parathyroidectomy  1992  . Colonoscopy  09  . Nose surgery  1978  . Appendectomy  1964  . Vaginal hysterectomy  1972  . Cholecystectomy  1982    Gall Bladder  . Carotid endarterectomy  2005    Left CEA  . Carotid endarterectomy  2009    Right CEA  . Power port  03/06/2009    Right  upper chest   . Tumor excision Right August 24, 2013    [facility] Dr. Ellen Henri, ENT    Allergies  Allergen Reactions  . Actos [Pioglitazone Hydrochloride]   . Amaryl   . Codeine   . Niacin And Related   . Penicillins   . Red Yeast Rice   . Sanctura [Trospium Chloride]   . Statins Other (See Comments)    Severe headache,  Hips and leg weakness that cause patient not to be able to function as per patient.  Lisbeth Ply [Fesoterodine Fumarate]   . Ultram [Tramadol Hcl]     Current Outpatient Prescriptions  Medication Sig Dispense Refill  . aspirin 81 MG tablet Take 81 mg by mouth daily.      Marland Kitchen atenolol (TENORMIN) 50 MG tablet Take 50 mg by mouth daily.      .  Canagliflozin (INVOKANA) 300 MG TABS Take by mouth daily.    . clobetasol ointment (TEMOVATE) 0.05 % Apply 0.05 % topically as needed.    Marland Kitchen glimepiride (AMARYL) 4 MG tablet Take 4 mg by mouth daily.    Marland Kitchen lisinopril (PRINIVIL,ZESTRIL) 20 MG tablet Take 20 mg by mouth daily.      . metFORMIN (GLUCOPHAGE) 500 MG tablet Take 500 mg by mouth 2 (two) times daily with a meal.      . triamcinolone cream (KENALOG) 0.1 % Apply 0.1 % topically Twice daily.    Marland Kitchen amLODipine (NORVASC) 2.5 MG tablet Take 2.5 mg by mouth daily.    Marland Kitchen doxepin (SINEQUAN) 10 MG capsule Take 10 mg by mouth at bedtime.    . fish oil-omega-3 fatty acids 1000 MG capsule Take 2 g by mouth daily.      . Misc Natural Products (OSTEO BI-FLEX ADV DOUBLE ST PO) Take by mouth.      . Omega-3 Fat Ac-Cholecalciferol (MINICAPS VITAMIN-D/OMEGA-3) [phone] MG-UNIT CAPS Take 1,000 mg by mouth daily.    Marland Kitchen omeprazole (PRILOSEC) 20 MG capsule Take 20 mg by mouth daily.      . pantoprazole (PROTONIX) 40 MG tablet Take 40 mg by mouth daily.       No current facility-administered medications for this visit.    Review of Systems : See HPI for pertinent positives and negatives.  Physical Examination  Filed Vitals:   10/29/14 1022 10/29/14 1025 10/29/14 1026  BP: 153/63 136/60 133/69  Pulse: 60 60 60  Temp: 97.4 F (36.3 C)    Resp: 16    Height: 5' 3.5" (1.613 m)    Weight: 160 lb (72.576 kg)    SpO2: 98%     Body mass index is 27.89 kg/(m^2).  General: WDWN female in NAD GAIT: normal, slightly antalgic Eyes: PERRLA Pulmonary:  Non-labored, CTAB, no  Rales, no rhonchi, & no wheezing.  Cardiac: regular rhythm,  no detected murmur.  VASCULAR EXAM Carotid Bruits Right Left   Negative Negative    Aorta is not palpable. Radial pulses are 2+ palpable and equal.  LE Pulses Right Left       POPLITEAL  not palpable    not palpable       POSTERIOR TIBIAL  not palpable   not palpable        DORSALIS PEDIS      ANTERIOR TIBIAL  palpable   palpable     Gastrointestinal: soft, nontender, BS WNL, no r/g, small incisional hernia (from cholecystectomy).  Musculoskeletal: no muscle atrophy/wasting. M/S 5/5 in upper extremities, 4/5 in LE's, extremities without ischemic changes. Psoriasis patches on elbows. Feet are dry, pruritis, slightly irritated appearing on soles and left toes.  Neurologic: A&O X 3; Appropriate Affect, Speech is normal CN 2-12 intact, pain and light touch intact in extremities, motor exam as listed above.   Non-Invasive Vascular Imaging CAROTID DUPLEX 10/29/2014   CEREBROVASCULAR DUPLEX EVALUATION    INDICATION: Carotid artery disease    PREVIOUS INTERVENTION(S): Right carotid endarterectomy 11/2007; Left carotid endarterectomy 05/2003; Right neck tumor removal 08/2013    DUPLEX EXAM: Carotid duplex    RIGHT  LEFT  Peak Systolic Velocities (cm/s) End Diastolic Velocities (cm/s) Plaque LOCATION Peak Systolic Velocities (cm/s) End Diastolic Velocities (cm/s) Plaque  139 13 - CCA PROXIMAL 108 18 HT  87 17 HT CCA MID 96 22 -  67 12 HT CCA DISTAL 79 17 HT  92 8 - ECA 133 11 -  92 19 - ICA PROXIMAL 87 18 -  73 22 - ICA MID 90 25 -  84 25 - ICA DISTAL 105 30 -    N/A ICA / CCA Ratio (PSV) NA  Antegrade Vertebral Flow Antegrade  - Brachial Systolic Pressure (mmHg) -  Triphasic Brachial Artery Waveforms Triphasic    Plaque Morphology:  HM = Homogeneous, HT = Heterogeneous, CP = Calcific Plaque, SP = Smooth Plaque, IP = Irregular Plaque  ADDITIONAL FINDINGS:     IMPRESSION: 1. Patent bilateral carotid endarterectomy without evidence for restenosis.    Compared to the previous exam:  No significant change since exam of 10/02/2013      Assessment: Trinidy Masterson is a 74 y.o. female who is status post left carotid endarterectomy in [place] in 2005, and right carotid  endarterectomy by Dr. Amedeo Plenty in 2009.  July of 2015 she had a right neck tumor resection and had a slow recovery from this, she has mild aphasia and partial loss of right eye vision since this which she states is improving.  She states she may have had a TIA years ago as manifested by a headache, but she also has recurrent right sided headaches. She denies any history of hemiparesis. Today's carotid Duplex suggests patent bilateral carotid endarterectomy without evidence for restenosis. No significant change since exam of 10/02/2013.    Plan: Follow-up in 1 year with Carotid Duplex.   I discussed in depth with the patient the nature of atherosclerosis, and emphasized the importance of maximal medical management including strict control of blood pressure, blood glucose, and lipid levels, obtaining regular exercise, and continued cessation of smoking.  The patient is aware that without maximal medical management the underlying atherosclerotic disease process will progress, limiting the benefit of any interventions. The patient was given information about stroke prevention and what symptoms should prompt the patient to seek immediate medical care. Thank you for allowing [place] to participate in this patient's care.  Clemon Chambers, RN, MSN, FNP-C [facility] Office: [phone]  Clinic Physician: Bridgett Larsson  10/29/2014 10:38 AM

## 2015-01-02 DIAGNOSIS — R5382 Chronic fatigue, unspecified: Secondary | ICD-10-CM

## 2015-01-02 DIAGNOSIS — D8989 Other specified disorders involving the immune mechanism, not elsewhere classified: Secondary | ICD-10-CM | POA: Insufficient documentation

## 2015-01-02 DIAGNOSIS — G9332 Myalgic encephalomyelitis/chronic fatigue syndrome: Secondary | ICD-10-CM | POA: Insufficient documentation

## 2015-01-02 DIAGNOSIS — M1990 Unspecified osteoarthritis, unspecified site: Secondary | ICD-10-CM | POA: Insufficient documentation

## 2015-02-27 DIAGNOSIS — M5416 Radiculopathy, lumbar region: Secondary | ICD-10-CM | POA: Diagnosis not present

## 2015-02-27 DIAGNOSIS — Z6827 Body mass index (BMI) 27.0-27.9, adult: Secondary | ICD-10-CM | POA: Diagnosis not present

## 2015-03-05 ENCOUNTER — Other Ambulatory Visit: Payer: Self-pay | Admitting: Neurosurgery

## 2015-03-05 DIAGNOSIS — M5416 Radiculopathy, lumbar region: Secondary | ICD-10-CM

## 2015-03-06 DIAGNOSIS — I10 Essential (primary) hypertension: Secondary | ICD-10-CM | POA: Insufficient documentation

## 2015-03-06 DIAGNOSIS — D509 Iron deficiency anemia, unspecified: Secondary | ICD-10-CM | POA: Insufficient documentation

## 2015-03-06 DIAGNOSIS — I7090 Unspecified atherosclerosis: Secondary | ICD-10-CM | POA: Insufficient documentation

## 2015-03-06 DIAGNOSIS — E119 Type 2 diabetes mellitus without complications: Secondary | ICD-10-CM | POA: Insufficient documentation

## 2015-03-06 DIAGNOSIS — K769 Liver disease, unspecified: Secondary | ICD-10-CM | POA: Insufficient documentation

## 2015-03-07 ENCOUNTER — Ambulatory Visit
Admission: RE | Admit: 2015-03-07 | Discharge: 2015-03-07 | Disposition: A | Discharge status: Home / Self Care | Payer: Medicare Other | Source: Ambulatory Visit | Attending: Neurosurgery | Admitting: Neurosurgery

## 2015-03-07 DIAGNOSIS — M5416 Radiculopathy, lumbar region: Secondary | ICD-10-CM

## 2015-03-07 DIAGNOSIS — M47817 Spondylosis without myelopathy or radiculopathy, lumbosacral region: Secondary | ICD-10-CM | POA: Diagnosis not present

## 2015-03-07 IMAGING — XA DG EPIDURAL/NERVE ROOT
1 series · 1 of 1 positions shown · non-contrast
Comparison: none

CLINICAL DATA: Lumbosacral spondylosis without myelopathy

[Series 1: ortho standard · 1 of 1 slices shown]
[im 1/1]
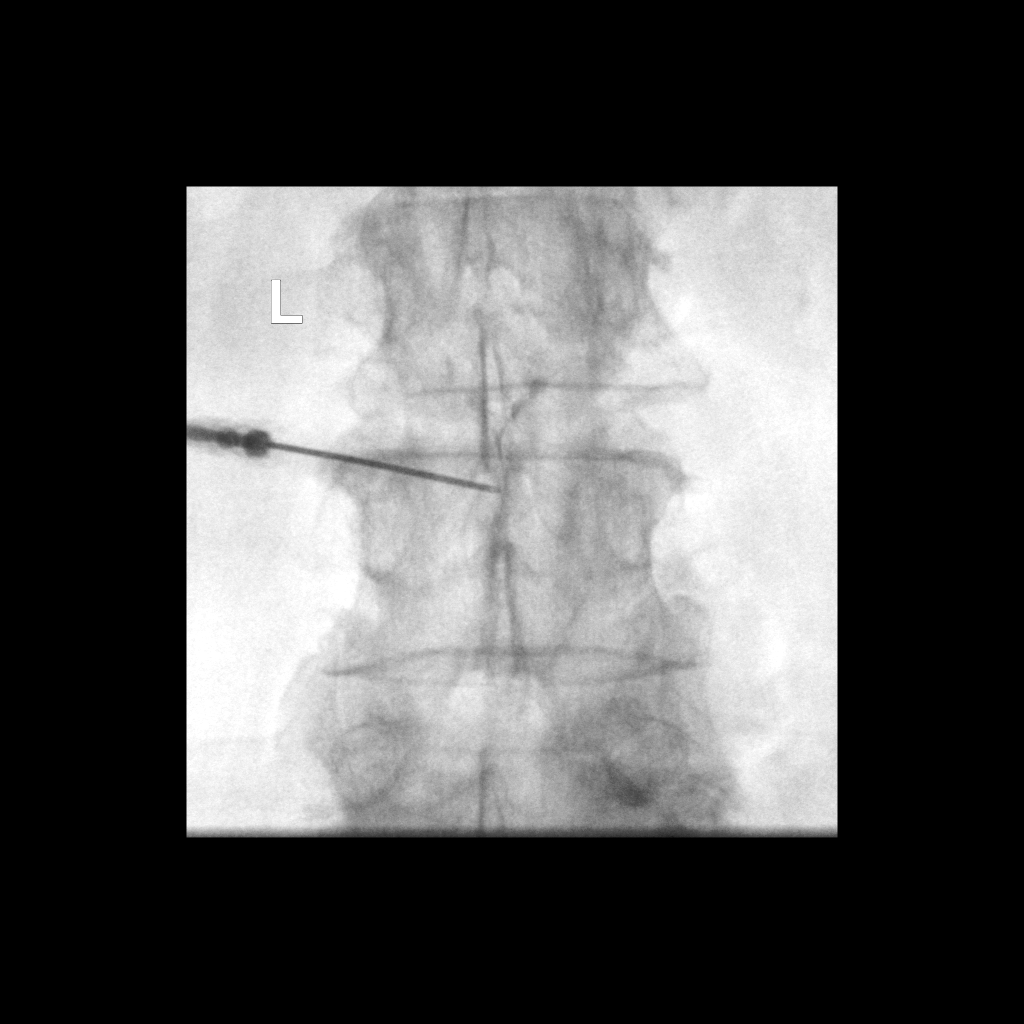

[1 of 1 positions shown; findings below may reference images not displayed]

FLUOROSCOPY TIME:  21 seconds

PROCEDURE:
LUMBAR EPIDURAL

The procedure, risks, benefits, and alternatives were explained to
the patient. Questions regarding the procedure were encouraged and
answered. The patient understands and consents to the procedure.

LUMBAR EPIDURAL INJECTION: An interlaminar approach was performed on
the left at L3-4. The overlying skin was cleansed and anesthetized.
A 20 gauge Crawford epidural needle was advanced using
loss-of-resistance technique.

DIAGNOSTIC EPIDURAL INJECTION: Injection of Omnipaque 180 shows a
good epidural pattern with spread above and below the level of
needle placement, primarily on the left. No vascular opacification
is seen.

THERAPEUTIC EPIDURAL INJECTION: 120 mg of Depo-Medrol mixed with 4
cc 1% lidocaine were instilled. The procedure was well-tolerated,
and the patient was discharged thirty minutes following the
injection in good condition.

COMPLICATIONS:
None
IMPRESSION: Technically successful epidural injection on the left at L3-4 # 1.

## 2015-03-07 MED ORDER — METHYLPREDNISOLONE ACETATE 40 MG/ML INJ SUSP (RADIOLOG
120.0000 mg | Freq: Once | INTRAMUSCULAR | Status: AC
  Administered 2015-03-07: 120 mg via EPIDURAL

## 2015-03-07 MED ORDER — IOHEXOL 180 MG/ML  SOLN
1.0000 mL | Freq: Once | INTRAMUSCULAR | Status: AC | PRN
  Administered 2015-03-07: 1 mL via EPIDURAL

## 2015-03-07 NOTE — Discharge Instructions (Signed)

## 2015-03-24 DIAGNOSIS — M5416 Radiculopathy, lumbar region: Secondary | ICD-10-CM | POA: Diagnosis not present

## 2015-03-24 DIAGNOSIS — Z6827 Body mass index (BMI) 27.0-27.9, adult: Secondary | ICD-10-CM | POA: Diagnosis not present

## 2015-04-18 DIAGNOSIS — E7800 Pure hypercholesterolemia, unspecified: Secondary | ICD-10-CM | POA: Diagnosis not present

## 2015-04-18 DIAGNOSIS — I639 Cerebral infarction, unspecified: Secondary | ICD-10-CM | POA: Diagnosis not present

## 2015-04-18 DIAGNOSIS — E1142 Type 2 diabetes mellitus with diabetic polyneuropathy: Secondary | ICD-10-CM | POA: Diagnosis not present

## 2015-04-18 DIAGNOSIS — Z87891 Personal history of nicotine dependence: Secondary | ICD-10-CM | POA: Diagnosis not present

## 2015-04-18 DIAGNOSIS — Z6829 Body mass index (BMI) 29.0-29.9, adult: Secondary | ICD-10-CM | POA: Diagnosis not present

## 2015-05-06 DIAGNOSIS — Z85828 Personal history of other malignant neoplasm of skin: Secondary | ICD-10-CM | POA: Diagnosis not present

## 2015-05-06 DIAGNOSIS — L400 Psoriasis vulgaris: Secondary | ICD-10-CM | POA: Diagnosis not present

## 2015-05-09 DIAGNOSIS — D696 Thrombocytopenia, unspecified: Secondary | ICD-10-CM | POA: Diagnosis not present

## 2015-05-09 DIAGNOSIS — R5382 Chronic fatigue, unspecified: Secondary | ICD-10-CM | POA: Diagnosis not present

## 2015-05-09 DIAGNOSIS — E611 Iron deficiency: Secondary | ICD-10-CM | POA: Diagnosis not present

## 2015-05-09 DIAGNOSIS — D732 Chronic congestive splenomegaly: Secondary | ICD-10-CM | POA: Diagnosis not present

## 2015-05-09 DIAGNOSIS — K769 Liver disease, unspecified: Secondary | ICD-10-CM | POA: Diagnosis not present

## 2015-05-21 DIAGNOSIS — M5416 Radiculopathy, lumbar region: Secondary | ICD-10-CM | POA: Diagnosis not present

## 2015-05-21 DIAGNOSIS — Z6827 Body mass index (BMI) 27.0-27.9, adult: Secondary | ICD-10-CM | POA: Diagnosis not present

## 2015-05-28 ENCOUNTER — Other Ambulatory Visit: Payer: Self-pay | Admitting: Neurosurgery

## 2015-05-28 DIAGNOSIS — M5416 Radiculopathy, lumbar region: Secondary | ICD-10-CM

## 2015-05-30 ENCOUNTER — Other Ambulatory Visit: Payer: Medicare Other

## 2015-06-03 ENCOUNTER — Ambulatory Visit
Admission: RE | Admit: 2015-06-03 | Discharge: 2015-06-03 | Disposition: A | Discharge status: Home / Self Care | Payer: Medicare Other | Source: Ambulatory Visit | Attending: Neurosurgery | Admitting: Neurosurgery

## 2015-06-03 DIAGNOSIS — M5416 Radiculopathy, lumbar region: Secondary | ICD-10-CM

## 2015-06-03 DIAGNOSIS — M47817 Spondylosis without myelopathy or radiculopathy, lumbosacral region: Secondary | ICD-10-CM | POA: Diagnosis not present

## 2015-06-03 IMAGING — XA DG EPIDUROGRAM S+I
1 series · 1 of 1 positions shown · non-contrast
Comparison: none

CLINICAL DATA: Lumbosacral spondylosis without myelopathy

[Series 1: ortho standard · 1 of 1 slices shown]
[im 1/1]
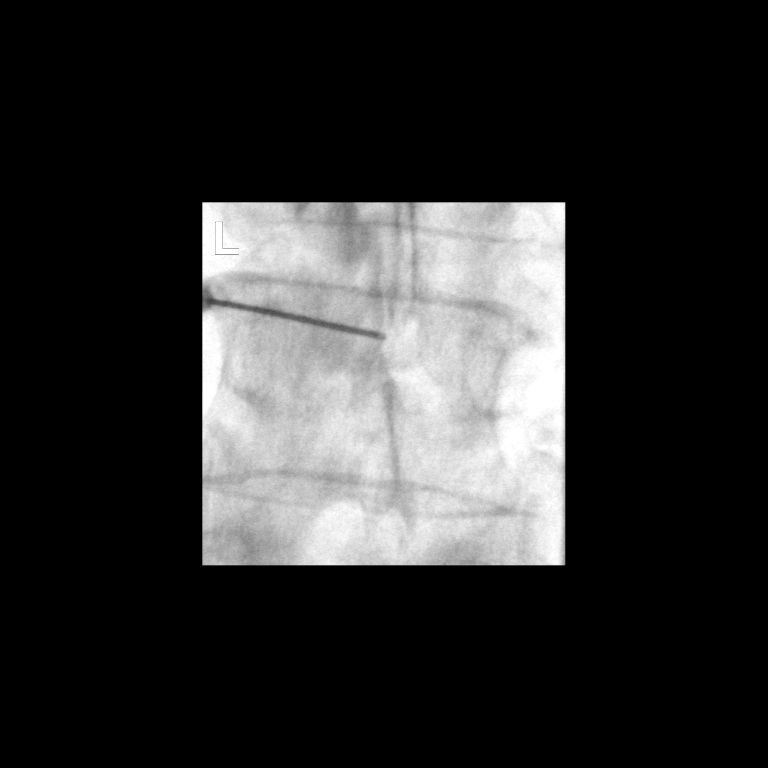

[1 of 1 positions shown; findings below may reference images not displayed]

FLUOROSCOPY TIME:  13 seconds

PROCEDURE:
LUMBAR EPIDURAL

The procedure, risks, benefits, and alternatives were explained to
the patient. Questions regarding the procedure were encouraged and
answered. The patient understands and consents to the procedure.

LUMBAR EPIDURAL INJECTION: An interlaminar approach was performed on
the left at L3-4. The overlying skin was cleansed and anesthetized.
A 20 gauge epidural needle was advanced using loss-of-resistance
technique.

DIAGNOSTIC EPIDURAL INJECTION: Injection of Omnipaque 180 shows a
good epidural pattern with spread above and below the level of
needle placement, primarily on the left. No vascular opacification
is seen.

THERAPEUTIC EPIDURAL INJECTION: 120 mg of Depo-Medrol mixed with 4
cc 1% lidocaine were instilled. The procedure was well-tolerated,
and the patient was discharged thirty minutes following the
injection in good condition.

COMPLICATIONS:
None
IMPRESSION: Technically successful epidural injection on the left at L3-4.

## 2015-06-03 MED ORDER — METHYLPREDNISOLONE ACETATE 40 MG/ML INJ SUSP (RADIOLOG
120.0000 mg | Freq: Once | INTRAMUSCULAR | Status: AC
  Administered 2015-06-03: 120 mg via EPIDURAL

## 2015-06-03 MED ORDER — IOHEXOL 180 MG/ML  SOLN
1.0000 mL | Freq: Once | INTRAMUSCULAR | Status: AC | PRN
  Administered 2015-06-03: 1 mL via EPIDURAL

## 2015-06-06 DIAGNOSIS — E1142 Type 2 diabetes mellitus with diabetic polyneuropathy: Secondary | ICD-10-CM | POA: Diagnosis not present

## 2015-06-06 DIAGNOSIS — I10 Essential (primary) hypertension: Secondary | ICD-10-CM | POA: Diagnosis not present

## 2015-06-06 DIAGNOSIS — E7800 Pure hypercholesterolemia, unspecified: Secondary | ICD-10-CM | POA: Diagnosis not present

## 2015-06-06 DIAGNOSIS — M545 Low back pain: Secondary | ICD-10-CM | POA: Diagnosis not present

## 2015-06-11 DIAGNOSIS — E119 Type 2 diabetes mellitus without complications: Secondary | ICD-10-CM | POA: Diagnosis not present

## 2015-06-11 DIAGNOSIS — I10 Essential (primary) hypertension: Secondary | ICD-10-CM | POA: Diagnosis not present

## 2015-06-30 DIAGNOSIS — I10 Essential (primary) hypertension: Secondary | ICD-10-CM | POA: Diagnosis not present

## 2015-06-30 DIAGNOSIS — E119 Type 2 diabetes mellitus without complications: Secondary | ICD-10-CM | POA: Diagnosis not present

## 2015-07-22 ENCOUNTER — Other Ambulatory Visit: Payer: Self-pay | Admitting: Neurosurgery

## 2015-07-22 DIAGNOSIS — M5416 Radiculopathy, lumbar region: Secondary | ICD-10-CM

## 2015-07-30 ENCOUNTER — Ambulatory Visit
Admission: RE | Admit: 2015-07-30 | Discharge: 2015-07-30 | Disposition: A | Discharge status: Home / Self Care | Payer: Medicare Other | Source: Ambulatory Visit | Attending: Neurosurgery | Admitting: Neurosurgery

## 2015-07-30 DIAGNOSIS — M5416 Radiculopathy, lumbar region: Secondary | ICD-10-CM

## 2015-07-30 IMAGING — XA DG EPIDUROGRAM S+I
1 series · 1 of 1 positions shown · non-contrast
Comparison: none

CLINICAL DATA: Lumbosacral spondylosis without myelopathy. Lumbar
radiculopathy. Left-sided low back pain radiating into the left
lower extremity. Prior epidural injection provided essentially
complete relief with pain returning 2 weeks ago.

[Series 1: ortho standard · 1 of 1 slices shown]
[im 1/1]
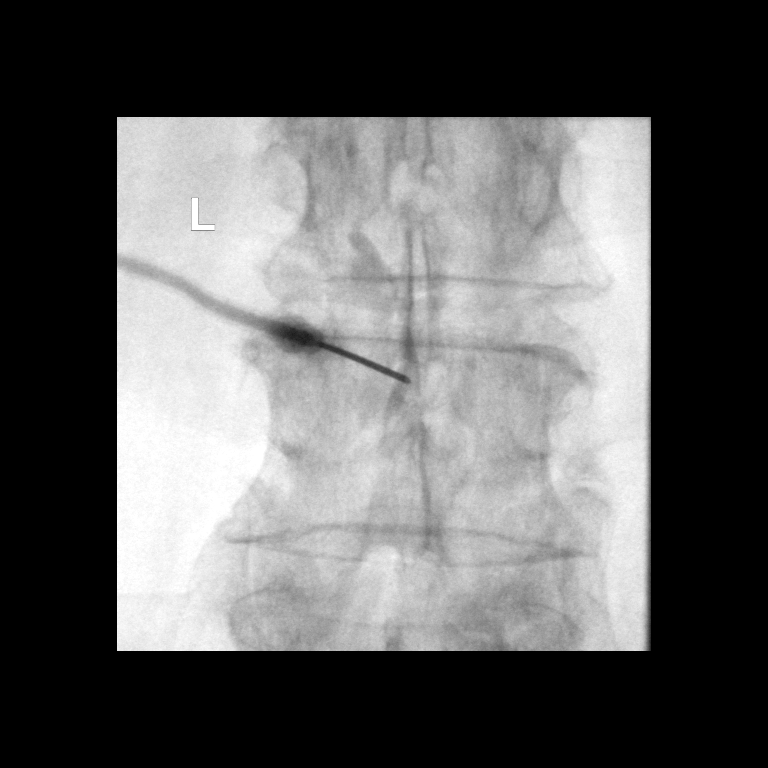

[1 of 1 positions shown; findings below may reference images not displayed]

FLUOROSCOPY TIME:  Radiation Exposure Index (as provided by the
fluoroscopic device): 18.87 microGray*m^2

Fluoroscopy Time (in minutes and seconds):  10 seconds

PROCEDURE:
The procedure, risks, benefits, and alternatives were explained to
the patient. Questions regarding the procedure were encouraged and
answered. The patient understands and consents to the procedure.

LUMBAR EPIDURAL INJECTION:

An interlaminar approach was performed on the left at L3-4. The
overlying skin was cleansed and anesthetized. A 3.5 inch 20 gauge
epidural needle was advanced using loss-of-resistance technique.

DIAGNOSTIC EPIDURAL INJECTION:

Injection of Isovue-M 200 shows a good epidural pattern with spread
above and below the level of needle placement, primarily on the
left. No vascular opacification is seen.

THERAPEUTIC EPIDURAL INJECTION:

120 mg of Depo-Medrol mixed with 3 mL of 1% lidocaine were
instilled. The procedure was well-tolerated, and the patient was
discharged thirty minutes following the injection in good condition.

COMPLICATIONS:
None
IMPRESSION: Technically successful lumbar interlaminar epidural injection on the
left at L3-4.

## 2015-07-30 MED ORDER — METHYLPREDNISOLONE ACETATE 40 MG/ML INJ SUSP (RADIOLOG
120.0000 mg | Freq: Once | INTRAMUSCULAR | Status: AC
  Administered 2015-07-30: 120 mg via EPIDURAL

## 2015-07-30 MED ORDER — IOPAMIDOL (ISOVUE-M 200) INJECTION 41%
1.0000 mL | Freq: Once | INTRAMUSCULAR | Status: AC
  Administered 2015-07-30: 1 mL via EPIDURAL

## 2015-07-30 NOTE — Discharge Instructions (Signed)

## 2015-08-06 DIAGNOSIS — M545 Low back pain: Secondary | ICD-10-CM | POA: Diagnosis not present

## 2015-08-06 DIAGNOSIS — E1142 Type 2 diabetes mellitus with diabetic polyneuropathy: Secondary | ICD-10-CM | POA: Diagnosis not present

## 2015-08-06 DIAGNOSIS — E7800 Pure hypercholesterolemia, unspecified: Secondary | ICD-10-CM | POA: Diagnosis not present

## 2015-08-06 DIAGNOSIS — I10 Essential (primary) hypertension: Secondary | ICD-10-CM | POA: Diagnosis not present

## 2015-08-21 DIAGNOSIS — E119 Type 2 diabetes mellitus without complications: Secondary | ICD-10-CM | POA: Diagnosis not present

## 2015-08-21 DIAGNOSIS — I10 Essential (primary) hypertension: Secondary | ICD-10-CM | POA: Diagnosis not present

## 2015-09-10 DIAGNOSIS — R5383 Other fatigue: Secondary | ICD-10-CM | POA: Diagnosis not present

## 2015-09-10 DIAGNOSIS — Z299 Encounter for prophylactic measures, unspecified: Secondary | ICD-10-CM | POA: Diagnosis not present

## 2015-09-10 DIAGNOSIS — Z7189 Other specified counseling: Secondary | ICD-10-CM | POA: Diagnosis not present

## 2015-09-10 DIAGNOSIS — Z6827 Body mass index (BMI) 27.0-27.9, adult: Secondary | ICD-10-CM | POA: Diagnosis not present

## 2015-09-10 DIAGNOSIS — R21 Rash and other nonspecific skin eruption: Secondary | ICD-10-CM | POA: Diagnosis not present

## 2015-09-10 DIAGNOSIS — E7800 Pure hypercholesterolemia, unspecified: Secondary | ICD-10-CM | POA: Diagnosis not present

## 2015-09-10 DIAGNOSIS — E1142 Type 2 diabetes mellitus with diabetic polyneuropathy: Secondary | ICD-10-CM | POA: Diagnosis not present

## 2015-09-10 DIAGNOSIS — Z1389 Encounter for screening for other disorder: Secondary | ICD-10-CM | POA: Diagnosis not present

## 2015-09-10 DIAGNOSIS — Z1211 Encounter for screening for malignant neoplasm of colon: Secondary | ICD-10-CM | POA: Diagnosis not present

## 2015-09-10 DIAGNOSIS — Z0000 Encounter for general adult medical examination without abnormal findings: Secondary | ICD-10-CM | POA: Diagnosis not present

## 2015-09-11 DIAGNOSIS — R5383 Other fatigue: Secondary | ICD-10-CM | POA: Diagnosis not present

## 2015-09-11 DIAGNOSIS — E559 Vitamin D deficiency, unspecified: Secondary | ICD-10-CM | POA: Diagnosis not present

## 2015-09-11 DIAGNOSIS — E1142 Type 2 diabetes mellitus with diabetic polyneuropathy: Secondary | ICD-10-CM | POA: Diagnosis not present

## 2015-09-11 DIAGNOSIS — E7800 Pure hypercholesterolemia, unspecified: Secondary | ICD-10-CM | POA: Diagnosis not present

## 2015-09-24 DIAGNOSIS — H25813 Combined forms of age-related cataract, bilateral: Secondary | ICD-10-CM | POA: Diagnosis not present

## 2015-09-24 DIAGNOSIS — H353131 Nonexudative age-related macular degeneration, bilateral, early dry stage: Secondary | ICD-10-CM | POA: Diagnosis not present

## 2015-09-24 DIAGNOSIS — E113293 Type 2 diabetes mellitus with mild nonproliferative diabetic retinopathy without macular edema, bilateral: Secondary | ICD-10-CM | POA: Diagnosis not present

## 2015-10-23 DIAGNOSIS — E2839 Other primary ovarian failure: Secondary | ICD-10-CM | POA: Diagnosis not present

## 2015-10-27 DIAGNOSIS — R5383 Other fatigue: Secondary | ICD-10-CM | POA: Diagnosis not present

## 2015-10-27 DIAGNOSIS — D509 Iron deficiency anemia, unspecified: Secondary | ICD-10-CM | POA: Diagnosis not present

## 2015-10-30 ENCOUNTER — Other Ambulatory Visit: Payer: Self-pay | Admitting: Neurosurgery

## 2015-10-30 ENCOUNTER — Encounter: Payer: Self-pay | Admitting: Family

## 2015-10-30 DIAGNOSIS — M5416 Radiculopathy, lumbar region: Secondary | ICD-10-CM

## 2015-11-04 ENCOUNTER — Ambulatory Visit (HOSPITAL_COMMUNITY)
Admission: RE | Admit: 2015-11-04 | Discharge: 2015-11-04 | Disposition: A | Discharge status: Home / Self Care | Payer: Medicare Other | Source: Ambulatory Visit | Attending: Family | Admitting: Family

## 2015-11-04 ENCOUNTER — Ambulatory Visit (INDEPENDENT_AMBULATORY_CARE_PROVIDER_SITE_OTHER): Payer: Medicare Other | Admitting: Family

## 2015-11-04 ENCOUNTER — Encounter: Payer: Self-pay | Admitting: Family

## 2015-11-04 VITALS — BP 124/80 | HR 73 | Temp 97.5°F | Resp 16 | Ht 63.5 in | Wt 153.0 lb

## 2015-11-04 DIAGNOSIS — Z48812 Encounter for surgical aftercare following surgery on the circulatory system: Secondary | ICD-10-CM | POA: Insufficient documentation

## 2015-11-04 DIAGNOSIS — Z9889 Other specified postprocedural states: Secondary | ICD-10-CM | POA: Insufficient documentation

## 2015-11-04 DIAGNOSIS — Z87891 Personal history of nicotine dependence: Secondary | ICD-10-CM | POA: Insufficient documentation

## 2015-11-04 DIAGNOSIS — B353 Tinea pedis: Secondary | ICD-10-CM | POA: Diagnosis not present

## 2015-11-04 DIAGNOSIS — I6523 Occlusion and stenosis of bilateral carotid arteries: Secondary | ICD-10-CM

## 2015-11-04 DIAGNOSIS — L400 Psoriasis vulgaris: Secondary | ICD-10-CM | POA: Diagnosis not present

## 2015-11-04 DIAGNOSIS — Z85828 Personal history of other malignant neoplasm of skin: Secondary | ICD-10-CM | POA: Diagnosis not present

## 2015-11-04 LAB — VAS US CAROTID
LCCADDIAS: -17 cm/s
LCCAPSYS: 85 cm/s
LEFT ECA DIAS: -10 cm/s
LICADDIAS: -22 cm/s
LICADSYS: -84 cm/s
LICAPDIAS: -16 cm/s
LICAPSYS: -72 cm/s
Left CCA dist sys: -76 cm/s
Left CCA prox dias: 15 cm/s
RCCAPSYS: 116 cm/s
RIGHT CCA MID DIAS: -8 cm/s
RIGHT ECA DIAS: -7 cm/s
Right CCA prox dias: 11 cm/s
Right cca dist sys: -91 cm/s

## 2015-11-04 NOTE — Progress Notes (Signed)
Chief Complaint: Follow up Extracranial Carotid Artery Stenosis   History of Present Illness  Jeanette Chang is a 75 y.o. female patient of Dr. Donnetta Hutching who is status post left carotid endarterectomy in [place] in 2005, and right carotid endarterectomy by Dr. Amedeo Plenty in 2009. She has remained asymptomatic from this. July of 2015 she had a right neck tumor resection and had a slow recovery from this, she has mild aphasia and partial loss of right eye vision since this which she states is improving.  She states she may have had a TIA years ago as manifested by a headache, but she also reports recurrent right sided headaches. She denies any history of hemiparesis.  She has a history of recurrent benign colon polyps. Her mother had a history of colon cancer. Her main concerns are bursitis in her right shoulder and arthritis in both hips.  Her hips pain limits her walking, she denies claudication type symptoms, denies non healing wounds.  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   Dr. 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   She also has psoriasis.   Pt Diabetic: yes, states her fasting home glucose readings are 70-120, possibly she thinks her last A1C was 7.0 Pt smoker: former smoker, quit in 1994  Pt meds include: Statin : no, statins cause severe headaches and severe weakness ASA: yes Other anticoagulants/antiplatelets: no    Past Medical History:  Diagnosis Date  . Abnormal LFTs   . Achilles bursitis or tendinitis   . Acute maxillary sinusitis   . Acute pharyngitis   . Anemia, unspecified   . Candidiasis of skin and nails   . Dermatophytosis of foot   . Dermatophytosis of the body   . Diverticulitis of colon (without mention of hemorrhage)   . Edema   . Headache(784.0)   . Malaise and fatigue   . 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  Osteoarthrosis, unspecified whether generalized or localized, unspecified site   . Other dyspnea and respiratory abnormality   . Other psoriasis   . Other specified disorders of rotator cuff syndrome of shoulder and allied disorders   . Pain in joint   . Pure hypercholesterolemia   . RUQ pain   . Spasm of back muscles   . Thrombocytopenia, unspecified ([place])   . Type II or unspecified type diabetes mellitus without mention of complication, not stated as uncontrolled   . Unspecified essential hypertension   . Unspecified sinusitis (chronic)   . Urinary incontinence     Social History Social History  Substance Use Topics  . Smoking status: Former Smoker    Types: Cigarettes  . Smokeless tobacco: Former Systems developer    Quit date: 12/16/1992  . Alcohol use 0.6 oz/week    1 Glasses of wine per week    Family History Family History  Problem Relation Age of Onset  . Heart attack Mother   . Cancer Mother   . Heart attack Father   . Cancer Sister     Surgical History Past Surgical History:  Procedure Laterality Date  . APPENDECTOMY  1964  . CAROTID ENDARTERECTOMY  2005   Left CEA  . CAROTID ENDARTERECTOMY  2009   Right CEA  . CHOLECYSTECTOMY  1982   Gall Bladder  . COLONOSCOPY  09  . NOSE SURGERY  1978  . PARATHYROIDECTOMY  1992  . Power  port  03/06/2009   Right upper chest   . TONSILLECTOMY    . TUMOR EXCISION Right August 24, 2013   [facility] Dr. Ellen Henri, ENT  . VAGINAL HYSTERECTOMY  1972    Allergies  Allergen Reactions  . Codeine Nausea And Vomiting and Rash  . Niacin And Related Other (See Comments)    Headaches, myalgias  . Red Yeast Rice Other (See Comments)    Headaches, myalgias  . Actos [Pioglitazone Hydrochloride]   . Amaryl   . Sanctura [Trospium Chloride]   . Statins Other (See Comments)    Severe headache,  Hips and leg weakness that cause patient not to be able to function as per patient.  Lisbeth Ply [Fesoterodine Fumarate]   .  Penicillins Rash  . Ultram [Tramadol Hcl] Nausea And Vomiting    Current Outpatient Prescriptions  Medication Sig Dispense Refill  . acetaminophen (TYLENOL) 500 MG tablet Take by mouth.    Marland Kitchen aspirin 81 MG tablet Take 81 mg by mouth daily.      Marland Kitchen atenolol (TENORMIN) 50 MG tablet Take 50 mg by mouth daily.      . Canagliflozin (INVOKANA) 300 MG TABS Take by mouth daily.    . clobetasol ointment (TEMOVATE) 0.05 % Apply 0.05 % topically as needed.    . folic acid (FOLVITE) 1 MG tablet Take by mouth.    Marland Kitchen glimepiride (AMARYL) 4 MG tablet Take 4 mg by mouth daily.    Marland Kitchen HYDROcodone-acetaminophen (NORCO) 7.5-325 MG tablet Take by mouth.    Marland Kitchen lisinopril (PRINIVIL,ZESTRIL) 40 MG tablet Take 40 mg by mouth daily.    . metFORMIN (GLUCOPHAGE) 500 MG tablet Take 500 mg by mouth 2 (two) times daily with a meal.      . Multiple Vitamins-Minerals (PRESERVISION AREDS 2+MULTI VIT PO) Take by mouth.    . Omega-3 Fat Ac-Cholecalciferol (MINICAPS VITAMIN-D/OMEGA-3) [phone] MG-UNIT CAPS Take 1,000 mg by mouth daily.    Marland Kitchen triamcinolone cream (KENALOG) 0.1 % Apply 0.1 % topically Twice daily.    Marland Kitchen amLODipine (NORVASC) 2.5 MG tablet Take 2.5 mg by mouth daily.    Marland Kitchen doxepin (SINEQUAN) 10 MG capsule Take 10 mg by mouth at bedtime.    . fish oil-omega-3 fatty acids 1000 MG capsule Take 2 g by mouth daily.      . Misc Natural Products (OSTEO BI-FLEX ADV DOUBLE ST PO) Take by mouth.      Marland Kitchen omeprazole (PRILOSEC) 20 MG capsule Take 20 mg by mouth daily.      . pantoprazole (PROTONIX) 40 MG tablet Take 40 mg by mouth daily.       No current facility-administered medications for this visit.     Review of Systems : See HPI for pertinent positives and negatives.  Physical Examination  Vitals:   11/04/15 1530 11/04/15 1539  BP: 120/80 124/80  Pulse: 73   Resp: 16   Temp: 97.5 F (36.4 C)   TempSrc: Oral   SpO2: 98%   Weight: 153 lb (69.4 kg)   Height: 5' 3.5" (1.613 m)    Body mass index is 26.68  kg/m.  General: WDWN female in NAD GAIT: normal, slightly antalgic Eyes: PERRLA Pulmonary: Respirations are non-labored, CTAB, good air movement in all fields.   Cardiac: regular rhythm, no detected murmur.  VASCULAR EXAM Carotid Bruits Right Left   Negative Negative    Aorta is not palpable. Radial pulses are 2+ palpable and equal.  LE Pulses Right Left       POPLITEAL  not palpable  not palpable       POSTERIOR TIBIAL  1+ palpable  2+ palpable       DORSALIS PEDIS      ANTERIOR TIBIAL  2+ palpable  2+ palpable    Gastrointestinal: soft, nontender, BS WNL, no r/g, small incisional hernia (from cholecystectomy).  Musculoskeletal: no muscle atrophy/wasting. M/S 5/5 in upper extremities, 4/5 in LE's, extremities without ischemic changes. Psoriasis patches on elbows. Left foot is dry, pruritic, slightly irritated appearing on the sole and left toes.  Neurologic: A&O X 3; Appropriate Affect, Speech is normal and loud, loquacious.  CN 2-12 intact, pain and light touch intact in extremities, motor exam as listed above.    Assessment: Jeanette Chang is a 75 y.o. female who is status post left carotid endarterectomy in [place] in 2005, and right carotid endarterectomy by Dr. Amedeo Plenty in 2009.  July of 2015 she had a right neck tumor resection and had a slow recovery from this, she has mild aphasia and partial loss of right eye vision since this which she states is improving.  She states she may have had a TIA years ago as manifested by a headache, but she also has recurrent right sided headaches. She denies any history of hemiparesis. She has had no subsequent neurological events.   DATA Today's carotid Duplex suggests patent bilateral carotid endarterectomy without evidence for restenosis. No significant change since exams of 10/02/2013 and  10/29/14.   Plan: Follow-up in 1 year with Carotid Duplex scan.   I discussed in depth with the patient the nature of atherosclerosis, and emphasized the importance of maximal medical management including strict control of blood pressure, blood glucose, and lipid levels, obtaining regular exercise, and continued cessation of smoking.  The patient is aware that without maximal medical management the underlying atherosclerotic disease process will progress, limiting the benefit of any interventions. The patient was given information about stroke prevention and what symptoms should prompt the patient to seek immediate medical care. Thank you for allowing [place] to participate in this patient's care.  Clemon Chambers, RN, MSN, FNP-C [facility] [place] Office: [phone]  Clinic Physician: Early  11/04/15 3:43 PM

## 2015-11-04 NOTE — Patient Instructions (Signed)
Stroke Prevention Some medical conditions and behaviors are associated with an increased chance of having a stroke. You may prevent a stroke by making healthy choices and managing medical conditions. HOW CAN I REDUCE MY RISK OF HAVING A STROKE?   Stay physically active. Get at least 30 minutes of activity on most or all days.  Do not smoke. It may also be helpful to avoid exposure to secondhand smoke.  Limit alcohol use. Moderate alcohol use is considered to be:  No more than 2 drinks per day for men.  No more than 1 drink per day for nonpregnant women.  Eat healthy foods. This involves:  Eating 5 or more servings of fruits and vegetables a day.  Making dietary changes that address high blood pressure (hypertension), high cholesterol, diabetes, or obesity.  Manage your cholesterol levels.  Making food choices that are high in fiber and low in saturated fat, trans fat, and cholesterol may control cholesterol levels.  Take any prescribed medicines to control cholesterol as directed by your health care provider.  Manage your diabetes.  Controlling your carbohydrate and sugar intake is recommended to manage diabetes.  Take any prescribed medicines to control diabetes as directed by your health care provider.  Control your hypertension.  Making food choices that are low in salt (sodium), saturated fat, trans fat, and cholesterol is recommended to manage hypertension.  Ask your health care provider if you need treatment to lower your blood pressure. Take any prescribed medicines to control hypertension as directed by your health care provider.  If you are 18-39 years of age, have your blood pressure checked every 3-5 years. If you are 40 years of age or older, have your blood pressure checked every year.  Maintain a healthy weight.  Reducing calorie intake and making food choices that are low in sodium, saturated fat, trans fat, and cholesterol are recommended to manage  weight.  Stop drug abuse.  Avoid taking birth control pills.  Talk to your health care provider about the risks of taking birth control pills if you are over 35 years old, smoke, get migraines, or have ever had a blood clot.  Get evaluated for sleep disorders (sleep apnea).  Talk to your health care provider about getting a sleep evaluation if you snore a lot or have excessive sleepiness.  Take medicines only as directed by your health care provider.  For some people, aspirin or blood thinners (anticoagulants) are helpful in reducing the risk of forming abnormal blood clots that can lead to stroke. If you have the irregular heart rhythm of atrial fibrillation, you should be on a blood thinner unless there is a good reason you cannot take them.  Understand all your medicine instructions.  Make sure that other conditions (such as anemia or atherosclerosis) are addressed. SEEK IMMEDIATE MEDICAL CARE IF:   You have sudden weakness or numbness of the face, arm, or leg, especially on one side of the body.  Your face or eyelid droops to one side.  You have sudden confusion.  You have trouble speaking (aphasia) or understanding.  You have sudden trouble seeing in one or both eyes.  You have sudden trouble walking.  You have dizziness.  You have a loss of balance or coordination.  You have a sudden, severe headache with no known cause.  You have new chest pain or an irregular heartbeat. Any of these symptoms may represent a serious problem that is an emergency. Do not wait to see if the symptoms will   go away. Get medical help at once. Call your local emergency services (911 in [place] Do not drive yourself to the hospital.   This information is not intended to replace advice given to you by your health care provider. Make sure you discuss any questions you have with your health care provider.   Document Released: 03/11/2004 Document Revised: 02/22/2014 Document Reviewed:  08/04/2012 Elsevier Interactive Patient Education 2016 [employer]  

## 2015-11-06 ENCOUNTER — Ambulatory Visit
Admission: RE | Admit: 2015-11-06 | Discharge: 2015-11-06 | Disposition: A | Discharge status: Home / Self Care | Payer: Medicare Other | Source: Ambulatory Visit | Attending: Neurosurgery | Admitting: Neurosurgery

## 2015-11-06 DIAGNOSIS — M47817 Spondylosis without myelopathy or radiculopathy, lumbosacral region: Secondary | ICD-10-CM | POA: Diagnosis not present

## 2015-11-06 DIAGNOSIS — M5416 Radiculopathy, lumbar region: Secondary | ICD-10-CM

## 2015-11-06 IMAGING — XA Imaging study
1 series · 1 of 1 positions shown · non-contrast
Comparison: none

CLINICAL DATA: Spondylosis without myelopathy. Left hip, thigh and
knee pain. Good response to previous L3-4 injections. Specific
request for repeat left L3-4 epidural.

[Series 1: ortho standard · 1 of 1 slices shown]
[im 1/1]
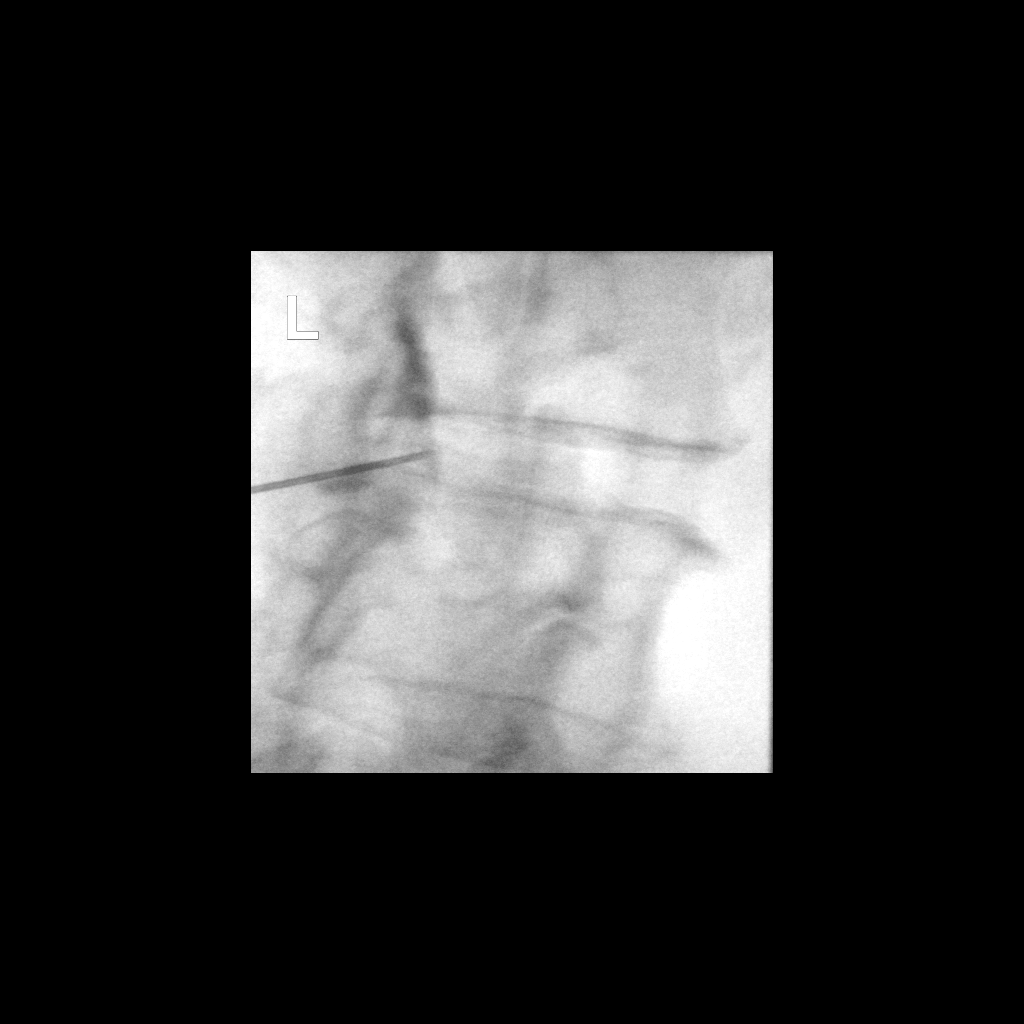

[1 of 1 positions shown; findings below may reference images not displayed]

FLUOROSCOPY TIME:  0 minutes 24 seconds. 34.17 micro gray meter
squared

PROCEDURE:
The procedure, risks, benefits, and alternatives were explained to
the patient. Questions regarding the procedure were encouraged and
answered. The patient understands and consents to the procedure.

LUMBAR EPIDURAL INJECTION:

An interlaminar approach was performed on left at L3-4. The
overlying skin was cleansed and anesthetized. A 20 gauge epidural
needle was advanced using loss-of-resistance technique.

DIAGNOSTIC EPIDURAL INJECTION:

Injection of Isovue-M 200 shows a good epidural pattern with spread
above and below the level of needle placement, primarily on the left
no vascular opacification is seen.

THERAPEUTIC EPIDURAL INJECTION:

One hundred twenty Mg of Depo-Medrol mixed with 2.5 cc 1% lidocaine
were instilled. The procedure was well-tolerated, and the patient
was discharged thirty minutes following the injection in good
condition.

COMPLICATIONS:
None
IMPRESSION: Technically successful epidural injection on the left at L3-4 .

## 2015-11-06 MED ORDER — IOPAMIDOL (ISOVUE-M 200) INJECTION 41%
1.0000 mL | Freq: Once | INTRAMUSCULAR | Status: AC
  Administered 2015-11-06: 1 mL via EPIDURAL

## 2015-11-06 MED ORDER — METHYLPREDNISOLONE ACETATE 40 MG/ML INJ SUSP (RADIOLOG
120.0000 mg | Freq: Once | INTRAMUSCULAR | Status: AC
  Administered 2015-11-06: 120 mg via EPIDURAL

## 2015-11-06 NOTE — Discharge Instructions (Signed)

## 2015-11-19 DIAGNOSIS — B353 Tinea pedis: Secondary | ICD-10-CM | POA: Diagnosis not present

## 2015-12-04 DIAGNOSIS — Z23 Encounter for immunization: Secondary | ICD-10-CM | POA: Diagnosis not present

## 2015-12-11 DIAGNOSIS — E7800 Pure hypercholesterolemia, unspecified: Secondary | ICD-10-CM | POA: Diagnosis not present

## 2015-12-11 DIAGNOSIS — Z299 Encounter for prophylactic measures, unspecified: Secondary | ICD-10-CM | POA: Diagnosis not present

## 2015-12-11 DIAGNOSIS — Z6827 Body mass index (BMI) 27.0-27.9, adult: Secondary | ICD-10-CM | POA: Diagnosis not present

## 2015-12-11 DIAGNOSIS — E1142 Type 2 diabetes mellitus with diabetic polyneuropathy: Secondary | ICD-10-CM | POA: Diagnosis not present

## 2015-12-11 DIAGNOSIS — I10 Essential (primary) hypertension: Secondary | ICD-10-CM | POA: Diagnosis not present

## 2015-12-11 DIAGNOSIS — M545 Low back pain: Secondary | ICD-10-CM | POA: Diagnosis not present

## 2015-12-12 DIAGNOSIS — I10 Essential (primary) hypertension: Secondary | ICD-10-CM | POA: Diagnosis not present

## 2015-12-12 DIAGNOSIS — E119 Type 2 diabetes mellitus without complications: Secondary | ICD-10-CM | POA: Diagnosis not present

## 2016-01-13 DIAGNOSIS — E119 Type 2 diabetes mellitus without complications: Secondary | ICD-10-CM | POA: Diagnosis not present

## 2016-01-13 DIAGNOSIS — I10 Essential (primary) hypertension: Secondary | ICD-10-CM | POA: Diagnosis not present

## 2016-01-15 DIAGNOSIS — E1142 Type 2 diabetes mellitus with diabetic polyneuropathy: Secondary | ICD-10-CM | POA: Diagnosis not present

## 2016-01-15 DIAGNOSIS — Z299 Encounter for prophylactic measures, unspecified: Secondary | ICD-10-CM | POA: Diagnosis not present

## 2016-01-15 DIAGNOSIS — Z6827 Body mass index (BMI) 27.0-27.9, adult: Secondary | ICD-10-CM | POA: Diagnosis not present

## 2016-01-15 DIAGNOSIS — I10 Essential (primary) hypertension: Secondary | ICD-10-CM | POA: Diagnosis not present

## 2016-01-15 DIAGNOSIS — H8390 Unspecified disease of inner ear, unspecified ear: Secondary | ICD-10-CM | POA: Diagnosis not present

## 2016-01-22 NOTE — Addendum Note (Signed)
Addended by: Lianne Cure A on: 01/22/2016 08:58 AM   Modules accepted: Orders

## 2016-02-10 DIAGNOSIS — I10 Essential (primary) hypertension: Secondary | ICD-10-CM | POA: Diagnosis not present

## 2016-02-10 DIAGNOSIS — E119 Type 2 diabetes mellitus without complications: Secondary | ICD-10-CM | POA: Diagnosis not present

## 2016-02-17 DIAGNOSIS — Z1231 Encounter for screening mammogram for malignant neoplasm of breast: Secondary | ICD-10-CM | POA: Diagnosis not present

## 2016-03-08 DIAGNOSIS — I10 Essential (primary) hypertension: Secondary | ICD-10-CM | POA: Diagnosis not present

## 2016-03-08 DIAGNOSIS — E119 Type 2 diabetes mellitus without complications: Secondary | ICD-10-CM | POA: Diagnosis not present

## 2016-03-17 DIAGNOSIS — E1142 Type 2 diabetes mellitus with diabetic polyneuropathy: Secondary | ICD-10-CM | POA: Diagnosis not present

## 2016-03-17 DIAGNOSIS — I639 Cerebral infarction, unspecified: Secondary | ICD-10-CM | POA: Diagnosis not present

## 2016-03-17 DIAGNOSIS — Z299 Encounter for prophylactic measures, unspecified: Secondary | ICD-10-CM | POA: Diagnosis not present

## 2016-03-17 DIAGNOSIS — D473 Essential (hemorrhagic) thrombocythemia: Secondary | ICD-10-CM | POA: Diagnosis not present

## 2016-03-17 DIAGNOSIS — D696 Thrombocytopenia, unspecified: Secondary | ICD-10-CM | POA: Diagnosis not present

## 2016-03-17 DIAGNOSIS — Z87891 Personal history of nicotine dependence: Secondary | ICD-10-CM | POA: Diagnosis not present

## 2016-03-17 DIAGNOSIS — Z713 Dietary counseling and surveillance: Secondary | ICD-10-CM | POA: Diagnosis not present

## 2016-03-17 DIAGNOSIS — Z6828 Body mass index (BMI) 28.0-28.9, adult: Secondary | ICD-10-CM | POA: Diagnosis not present

## 2016-04-12 DIAGNOSIS — E119 Type 2 diabetes mellitus without complications: Secondary | ICD-10-CM | POA: Diagnosis not present

## 2016-04-12 DIAGNOSIS — I10 Essential (primary) hypertension: Secondary | ICD-10-CM | POA: Diagnosis not present

## 2016-05-12 IMAGING — US US ABDOMEN COMPLETE
1 series · 14 of 25 positions shown · non-contrast
Comparison: CT [DATE].

CLINICAL DATA: Thrombocytopenia.  Fatty liver.

EXAM:
ABDOMEN ULTRASOUND COMPLETE

[Series 1: us abdomen complete · 0.19mm/px · 14 of 105 slices shown]
[im 1/105]
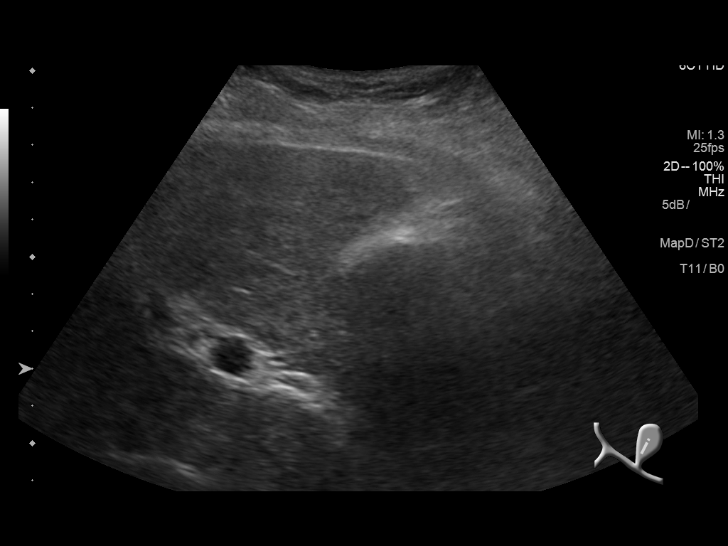
[im 9/105]
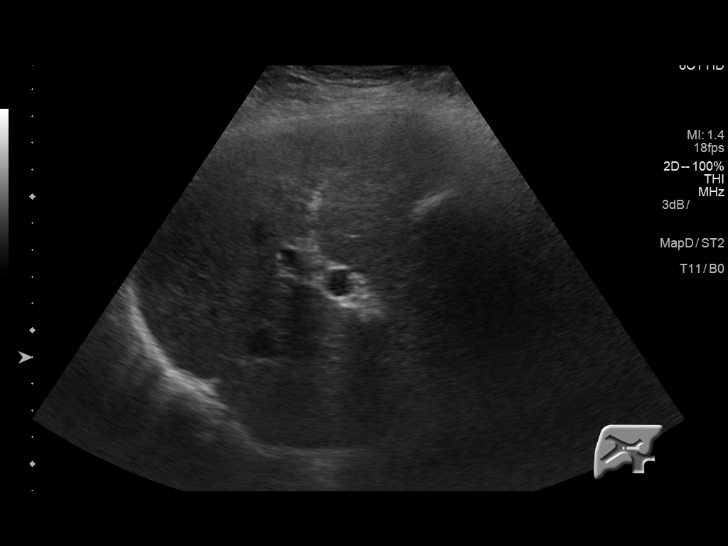
[im 18/105]
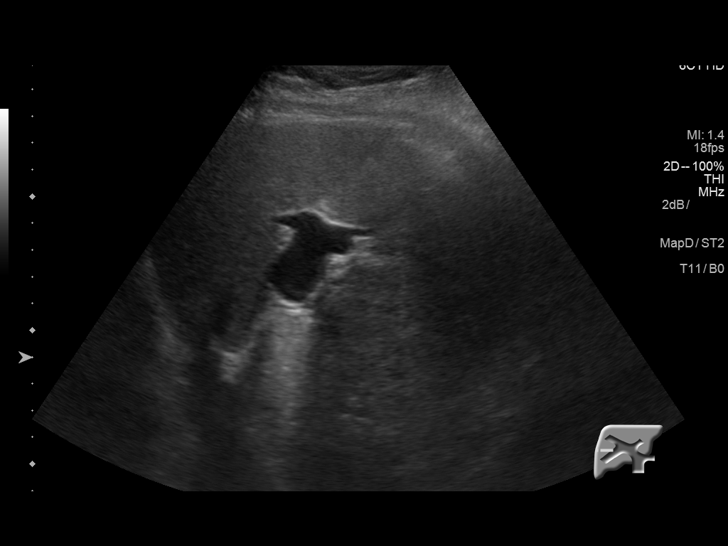
[im 27/105]
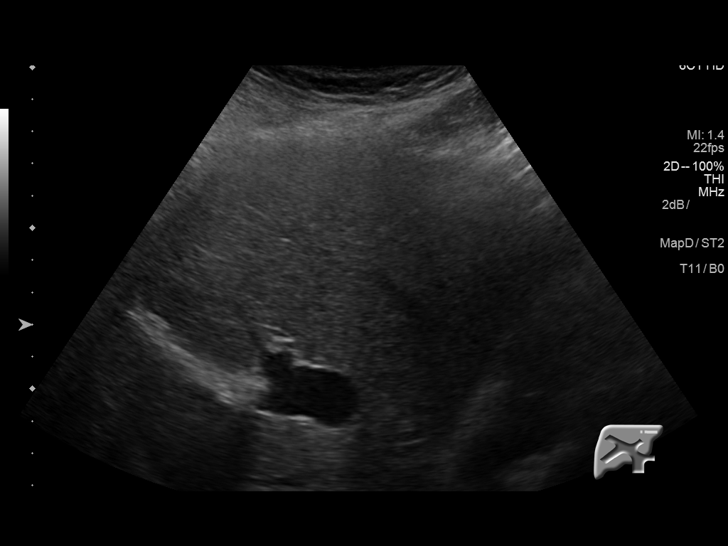
[im 35/105]
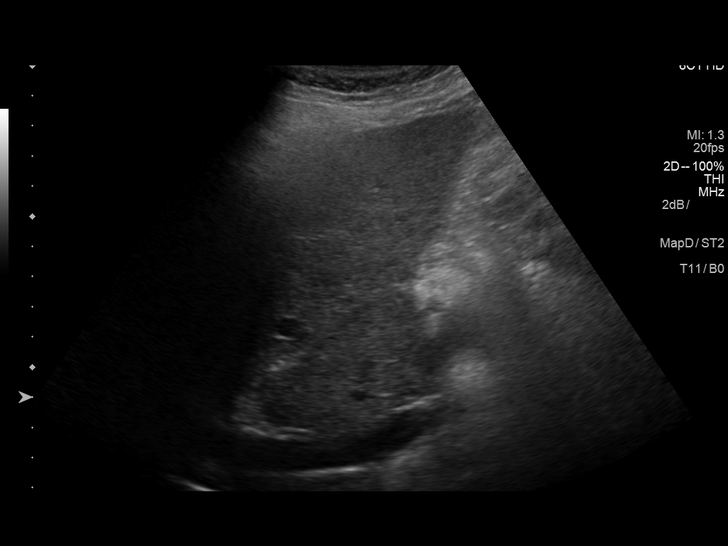
[im 40/105]
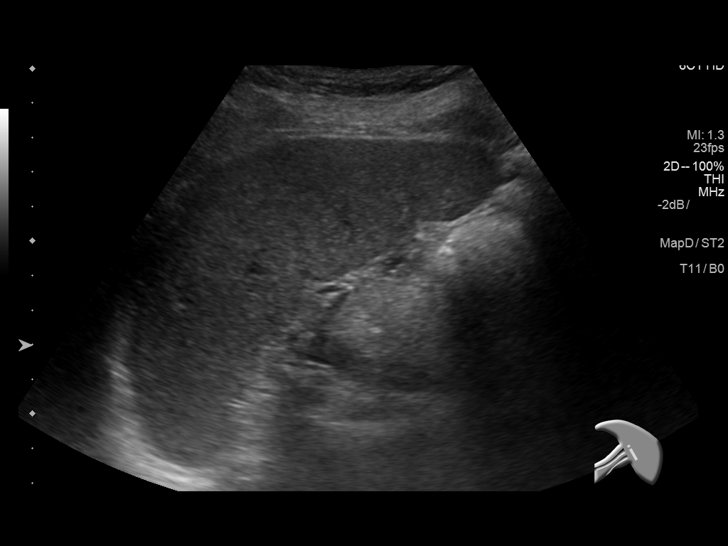
[im 48/105]
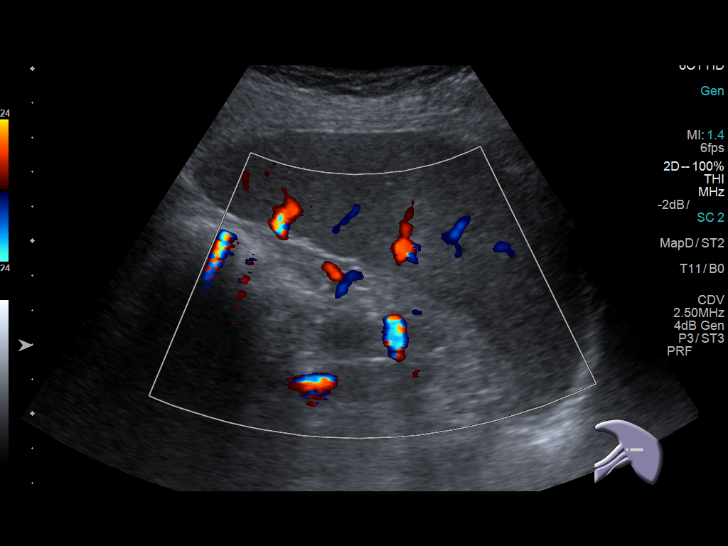
[im 57/105]
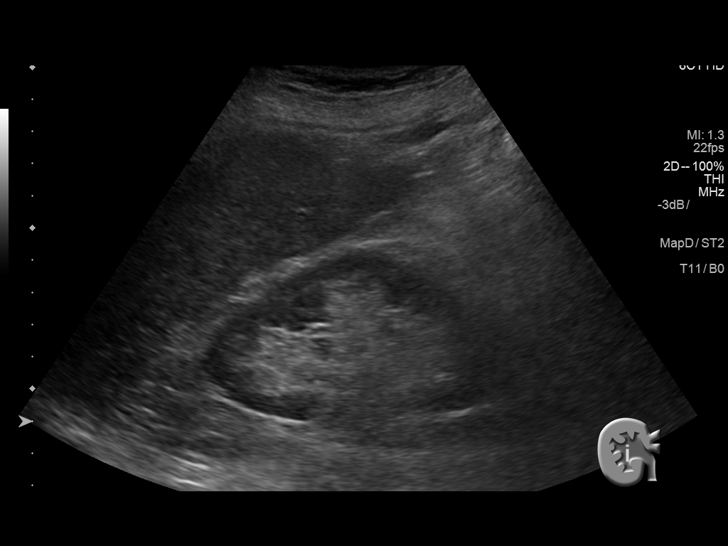
[im 66/105]
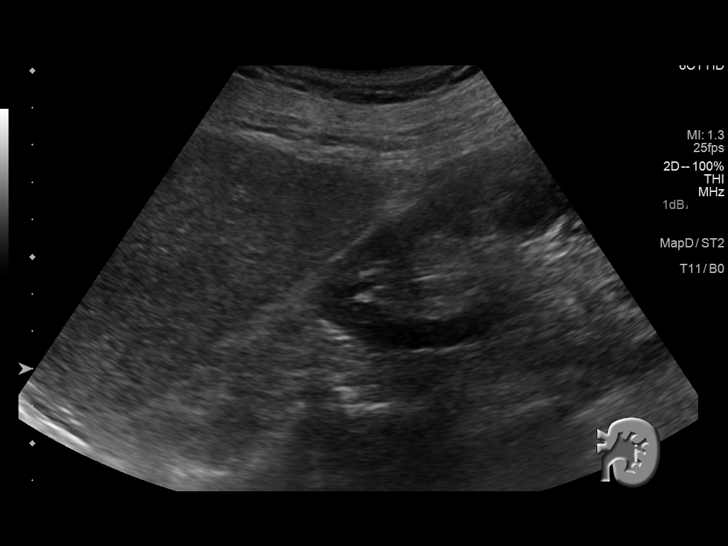
[im 70/105]
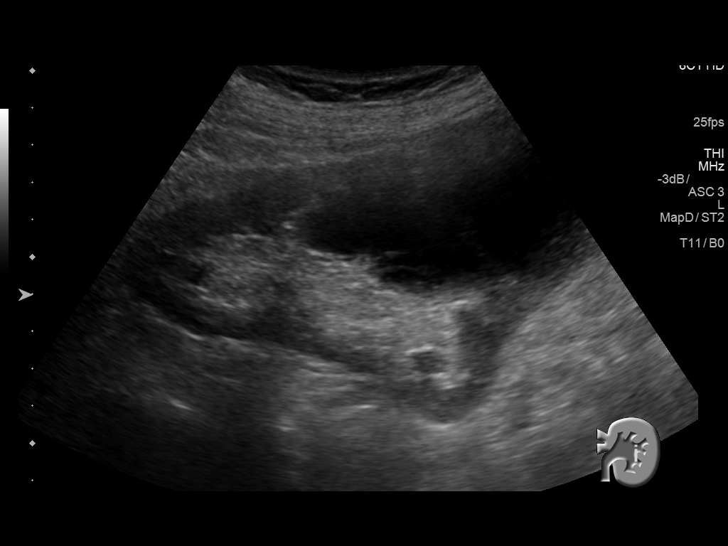
[im 79/105]
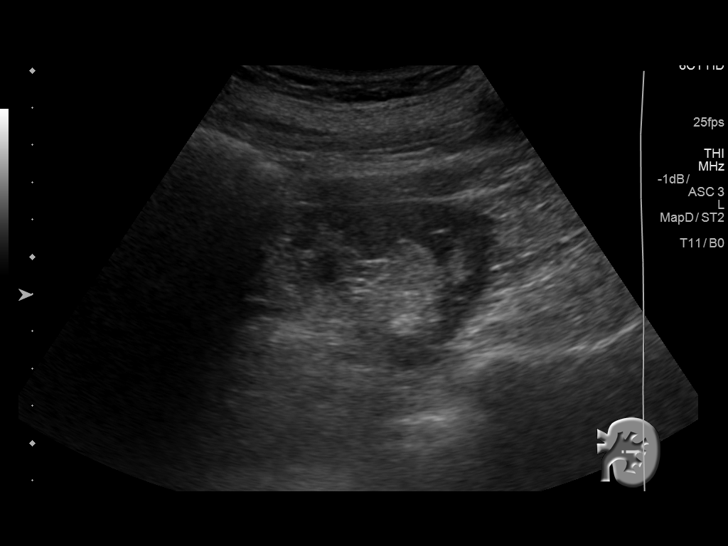
[im 87/105]
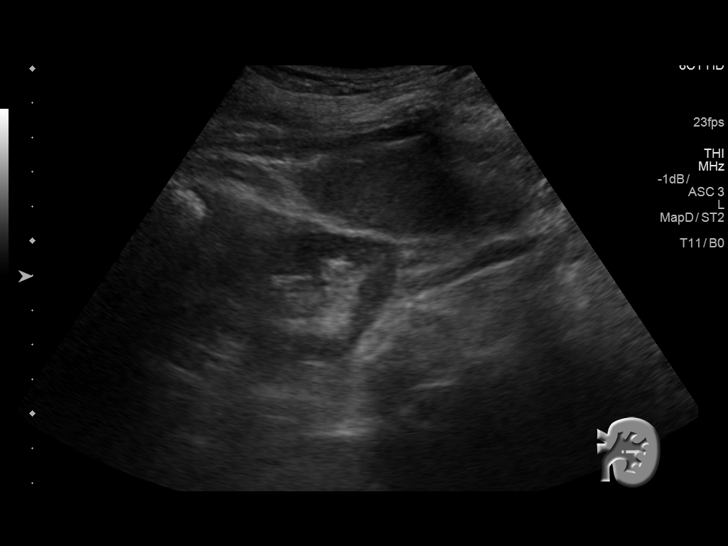
[im 96/105]
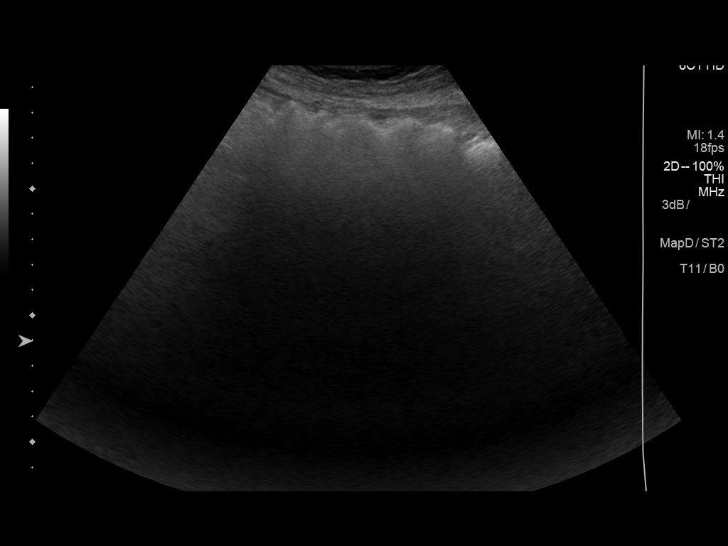
[im 105/105]
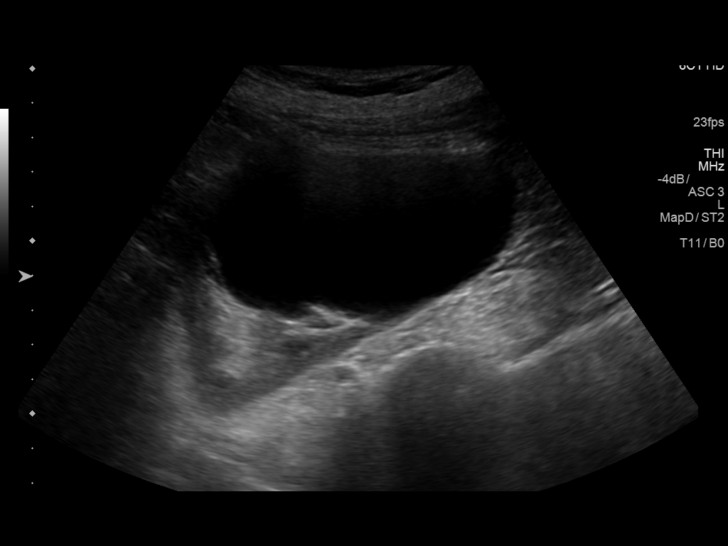

[14 of 25 positions shown; findings below may reference images not displayed]

FINDINGS: Gallbladder: Cholecystectomy.

Common bile duct: Diameter: 4.6 mm

Liver: Increased echogenicity consistent fatty infiltration and/or
hepatocellular disease. Portal vein is patent on color Doppler
imaging with normal direction of blood flow towards the liver.

IVC: No abnormality visualized.

Pancreas: Visualized portion unremarkable.

Spleen: Size and appearance within normal limits.

Right Kidney: Length: 10.2 cm. Echogenicity within normal limits. No
mass or hydronephrosis visualized.

Left Kidney: Length: 10.6 cm. Echogenicity within normal limits. No
hydronephrosis visualized. Large 12.4 cm exophytic cyst with mild
septation appears unchanged from prior exam.

Abdominal aorta: No aneurysm visualized.

Other findings: None.
IMPRESSION: 1. Cholecystectomy. No biliary distention. Slightly echogenic liver
suggesting fatty infiltration.

2. Large exophytic left renal cyst with mild septation, unchanged
from prior CT.

## 2016-05-19 DIAGNOSIS — L570 Actinic keratosis: Secondary | ICD-10-CM | POA: Diagnosis not present

## 2016-05-19 DIAGNOSIS — Z85828 Personal history of other malignant neoplasm of skin: Secondary | ICD-10-CM | POA: Diagnosis not present

## 2016-05-19 DIAGNOSIS — L400 Psoriasis vulgaris: Secondary | ICD-10-CM | POA: Diagnosis not present

## 2016-05-19 DIAGNOSIS — D485 Neoplasm of uncertain behavior of skin: Secondary | ICD-10-CM | POA: Diagnosis not present

## 2016-05-20 DIAGNOSIS — E1165 Type 2 diabetes mellitus with hyperglycemia: Secondary | ICD-10-CM | POA: Diagnosis not present

## 2016-05-20 DIAGNOSIS — E7800 Pure hypercholesterolemia, unspecified: Secondary | ICD-10-CM | POA: Diagnosis not present

## 2016-05-20 DIAGNOSIS — Z87891 Personal history of nicotine dependence: Secondary | ICD-10-CM | POA: Diagnosis not present

## 2016-05-20 DIAGNOSIS — Z6827 Body mass index (BMI) 27.0-27.9, adult: Secondary | ICD-10-CM | POA: Diagnosis not present

## 2016-05-20 DIAGNOSIS — I639 Cerebral infarction, unspecified: Secondary | ICD-10-CM | POA: Diagnosis not present

## 2016-05-20 DIAGNOSIS — I6529 Occlusion and stenosis of unspecified carotid artery: Secondary | ICD-10-CM | POA: Diagnosis not present

## 2016-05-20 DIAGNOSIS — D473 Essential (hemorrhagic) thrombocythemia: Secondary | ICD-10-CM | POA: Diagnosis not present

## 2016-05-20 DIAGNOSIS — Z299 Encounter for prophylactic measures, unspecified: Secondary | ICD-10-CM | POA: Diagnosis not present

## 2016-05-20 DIAGNOSIS — D696 Thrombocytopenia, unspecified: Secondary | ICD-10-CM | POA: Diagnosis not present

## 2016-05-20 DIAGNOSIS — L409 Psoriasis, unspecified: Secondary | ICD-10-CM | POA: Diagnosis not present

## 2016-05-20 DIAGNOSIS — E1142 Type 2 diabetes mellitus with diabetic polyneuropathy: Secondary | ICD-10-CM | POA: Diagnosis not present

## 2016-05-20 DIAGNOSIS — I10 Essential (primary) hypertension: Secondary | ICD-10-CM | POA: Diagnosis not present

## 2016-06-01 ENCOUNTER — Other Ambulatory Visit: Payer: Self-pay | Admitting: Physician Assistant

## 2016-06-01 DIAGNOSIS — M5416 Radiculopathy, lumbar region: Secondary | ICD-10-CM

## 2016-06-02 ENCOUNTER — Ambulatory Visit
Admission: RE | Admit: 2016-06-02 | Discharge: 2016-06-02 | Disposition: A | Discharge status: Home / Self Care | Payer: Medicare Other | Source: Ambulatory Visit | Attending: Physician Assistant | Admitting: Physician Assistant

## 2016-06-02 DIAGNOSIS — M5416 Radiculopathy, lumbar region: Secondary | ICD-10-CM

## 2016-06-02 DIAGNOSIS — M5126 Other intervertebral disc displacement, lumbar region: Secondary | ICD-10-CM | POA: Diagnosis not present

## 2016-06-02 IMAGING — XA Imaging study
1 series · 1 of 1 positions shown · non-contrast
Comparison: none

CLINICAL DATA: Lumbosacral spondylosis without myelopathy. LEFT leg
radicular symptoms. LEFT L3-4 disc protrusion.

[Series 1: ortho standard · 1 of 1 slices shown]
[im 1/1]
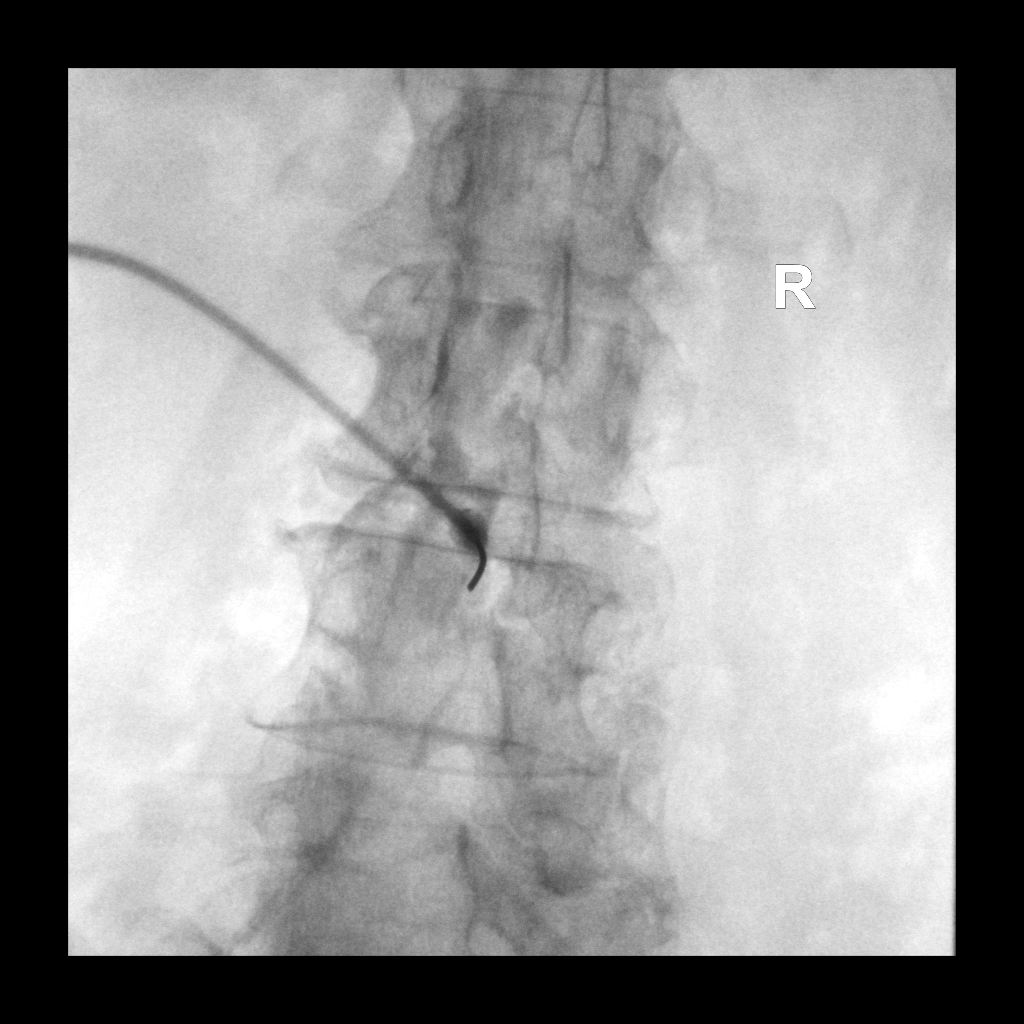

[1 of 1 positions shown; findings below may reference images not displayed]

FLUOROSCOPY TIME:  13 seconds corresponding to a Dose Area Product
of 34.4 sent ?Gy*m2

PROCEDURE:
The procedure, risks, benefits, and alternatives were explained to
the patient. Questions regarding the procedure were encouraged and
answered. The patient understands and consents to the procedure.

LUMBAR EPIDURAL INJECTION:

An interlaminar approach was performed on the LEFT at L3-L4. The
overlying skin was cleansed and anesthetized. A 20 gauge epidural
needle was advanced using loss-of-resistance technique.

DIAGNOSTIC EPIDURAL INJECTION:

Injection of Isovue-M 200 shows a good epidural pattern with spread
above and below the level of needle placement, primarily on the
LEFT; no vascular opacification is seen.

THERAPEUTIC EPIDURAL INJECTION:

120.0 Mg of Depo-Medrol mixed with 2 mL 1% lidocaine were instilled.
The procedure was well-tolerated, and the patient was discharged
thirty minutes following the injection in good condition.

COMPLICATIONS:
None.
IMPRESSION: Technically successful epidural injection on the LEFT L3-4 # 1.

## 2016-06-02 MED ORDER — METHYLPREDNISOLONE ACETATE 40 MG/ML INJ SUSP (RADIOLOG
120.0000 mg | Freq: Once | INTRAMUSCULAR | Status: AC
  Administered 2016-06-02: 120 mg via EPIDURAL

## 2016-06-02 MED ORDER — IOPAMIDOL (ISOVUE-M 200) INJECTION 41%
1.0000 mL | Freq: Once | INTRAMUSCULAR | Status: AC
  Administered 2016-06-02: 1 mL via EPIDURAL

## 2016-06-02 NOTE — Discharge Instructions (Signed)

## 2016-06-07 DIAGNOSIS — Z6826 Body mass index (BMI) 26.0-26.9, adult: Secondary | ICD-10-CM | POA: Diagnosis not present

## 2016-06-07 DIAGNOSIS — I10 Essential (primary) hypertension: Secondary | ICD-10-CM | POA: Diagnosis not present

## 2016-06-07 DIAGNOSIS — M5416 Radiculopathy, lumbar region: Secondary | ICD-10-CM | POA: Diagnosis not present

## 2016-06-09 DIAGNOSIS — E119 Type 2 diabetes mellitus without complications: Secondary | ICD-10-CM | POA: Diagnosis not present

## 2016-06-09 DIAGNOSIS — I10 Essential (primary) hypertension: Secondary | ICD-10-CM | POA: Diagnosis not present

## 2016-06-17 DIAGNOSIS — E1165 Type 2 diabetes mellitus with hyperglycemia: Secondary | ICD-10-CM | POA: Diagnosis not present

## 2016-06-17 DIAGNOSIS — Z299 Encounter for prophylactic measures, unspecified: Secondary | ICD-10-CM | POA: Diagnosis not present

## 2016-06-17 DIAGNOSIS — I639 Cerebral infarction, unspecified: Secondary | ICD-10-CM | POA: Diagnosis not present

## 2016-06-17 DIAGNOSIS — I6529 Occlusion and stenosis of unspecified carotid artery: Secondary | ICD-10-CM | POA: Diagnosis not present

## 2016-06-17 DIAGNOSIS — I10 Essential (primary) hypertension: Secondary | ICD-10-CM | POA: Diagnosis not present

## 2016-06-17 DIAGNOSIS — D473 Essential (hemorrhagic) thrombocythemia: Secondary | ICD-10-CM | POA: Diagnosis not present

## 2016-06-17 DIAGNOSIS — E7800 Pure hypercholesterolemia, unspecified: Secondary | ICD-10-CM | POA: Diagnosis not present

## 2016-06-17 DIAGNOSIS — Z6827 Body mass index (BMI) 27.0-27.9, adult: Secondary | ICD-10-CM | POA: Diagnosis not present

## 2016-06-17 DIAGNOSIS — E1142 Type 2 diabetes mellitus with diabetic polyneuropathy: Secondary | ICD-10-CM | POA: Diagnosis not present

## 2016-06-17 DIAGNOSIS — L409 Psoriasis, unspecified: Secondary | ICD-10-CM | POA: Diagnosis not present

## 2016-06-17 DIAGNOSIS — M1712 Unilateral primary osteoarthritis, left knee: Secondary | ICD-10-CM | POA: Diagnosis not present

## 2016-09-17 DIAGNOSIS — Z6827 Body mass index (BMI) 27.0-27.9, adult: Secondary | ICD-10-CM | POA: Diagnosis not present

## 2016-09-17 DIAGNOSIS — Z7189 Other specified counseling: Secondary | ICD-10-CM | POA: Diagnosis not present

## 2016-09-17 DIAGNOSIS — E559 Vitamin D deficiency, unspecified: Secondary | ICD-10-CM | POA: Diagnosis not present

## 2016-09-17 DIAGNOSIS — I10 Essential (primary) hypertension: Secondary | ICD-10-CM | POA: Diagnosis not present

## 2016-09-17 DIAGNOSIS — E1165 Type 2 diabetes mellitus with hyperglycemia: Secondary | ICD-10-CM | POA: Diagnosis not present

## 2016-09-17 DIAGNOSIS — Z79899 Other long term (current) drug therapy: Secondary | ICD-10-CM | POA: Diagnosis not present

## 2016-09-17 DIAGNOSIS — Z299 Encounter for prophylactic measures, unspecified: Secondary | ICD-10-CM | POA: Diagnosis not present

## 2016-09-17 DIAGNOSIS — Z0000 Encounter for general adult medical examination without abnormal findings: Secondary | ICD-10-CM | POA: Diagnosis not present

## 2016-09-17 DIAGNOSIS — E1142 Type 2 diabetes mellitus with diabetic polyneuropathy: Secondary | ICD-10-CM | POA: Diagnosis not present

## 2016-09-17 DIAGNOSIS — E7800 Pure hypercholesterolemia, unspecified: Secondary | ICD-10-CM | POA: Diagnosis not present

## 2016-09-17 DIAGNOSIS — R5383 Other fatigue: Secondary | ICD-10-CM | POA: Diagnosis not present

## 2016-09-17 DIAGNOSIS — Z1389 Encounter for screening for other disorder: Secondary | ICD-10-CM | POA: Diagnosis not present

## 2016-09-20 DIAGNOSIS — Z79899 Other long term (current) drug therapy: Secondary | ICD-10-CM | POA: Diagnosis not present

## 2016-09-20 DIAGNOSIS — D649 Anemia, unspecified: Secondary | ICD-10-CM | POA: Diagnosis not present

## 2016-09-20 DIAGNOSIS — E559 Vitamin D deficiency, unspecified: Secondary | ICD-10-CM | POA: Diagnosis not present

## 2016-09-20 DIAGNOSIS — R5383 Other fatigue: Secondary | ICD-10-CM | POA: Diagnosis not present

## 2016-09-20 DIAGNOSIS — E7800 Pure hypercholesterolemia, unspecified: Secondary | ICD-10-CM | POA: Diagnosis not present

## 2016-09-27 DIAGNOSIS — D473 Essential (hemorrhagic) thrombocythemia: Secondary | ICD-10-CM | POA: Diagnosis not present

## 2016-09-27 DIAGNOSIS — Z299 Encounter for prophylactic measures, unspecified: Secondary | ICD-10-CM | POA: Diagnosis not present

## 2016-09-27 DIAGNOSIS — I6529 Occlusion and stenosis of unspecified carotid artery: Secondary | ICD-10-CM | POA: Diagnosis not present

## 2016-09-27 DIAGNOSIS — E1165 Type 2 diabetes mellitus with hyperglycemia: Secondary | ICD-10-CM | POA: Diagnosis not present

## 2016-09-27 DIAGNOSIS — I10 Essential (primary) hypertension: Secondary | ICD-10-CM | POA: Diagnosis not present

## 2016-09-27 DIAGNOSIS — Z6827 Body mass index (BMI) 27.0-27.9, adult: Secondary | ICD-10-CM | POA: Diagnosis not present

## 2016-09-27 DIAGNOSIS — I639 Cerebral infarction, unspecified: Secondary | ICD-10-CM | POA: Diagnosis not present

## 2016-09-27 DIAGNOSIS — E1142 Type 2 diabetes mellitus with diabetic polyneuropathy: Secondary | ICD-10-CM | POA: Diagnosis not present

## 2016-09-27 DIAGNOSIS — E7800 Pure hypercholesterolemia, unspecified: Secondary | ICD-10-CM | POA: Diagnosis not present

## 2016-09-27 DIAGNOSIS — D509 Iron deficiency anemia, unspecified: Secondary | ICD-10-CM | POA: Diagnosis not present

## 2016-10-07 DIAGNOSIS — D509 Iron deficiency anemia, unspecified: Secondary | ICD-10-CM | POA: Diagnosis not present

## 2016-10-07 DIAGNOSIS — E119 Type 2 diabetes mellitus without complications: Secondary | ICD-10-CM | POA: Diagnosis not present

## 2016-10-07 DIAGNOSIS — D696 Thrombocytopenia, unspecified: Secondary | ICD-10-CM | POA: Diagnosis not present

## 2016-10-07 DIAGNOSIS — D709 Neutropenia, unspecified: Secondary | ICD-10-CM | POA: Diagnosis not present

## 2016-10-07 DIAGNOSIS — Z6826 Body mass index (BMI) 26.0-26.9, adult: Secondary | ICD-10-CM | POA: Diagnosis not present

## 2016-10-07 DIAGNOSIS — Z87891 Personal history of nicotine dependence: Secondary | ICD-10-CM | POA: Diagnosis not present

## 2016-10-07 DIAGNOSIS — I10 Essential (primary) hypertension: Secondary | ICD-10-CM | POA: Diagnosis not present

## 2016-10-21 DIAGNOSIS — H25813 Combined forms of age-related cataract, bilateral: Secondary | ICD-10-CM | POA: Diagnosis not present

## 2016-10-21 DIAGNOSIS — E119 Type 2 diabetes mellitus without complications: Secondary | ICD-10-CM | POA: Diagnosis not present

## 2016-10-21 DIAGNOSIS — H353131 Nonexudative age-related macular degeneration, bilateral, early dry stage: Secondary | ICD-10-CM | POA: Diagnosis not present

## 2016-10-22 DIAGNOSIS — D696 Thrombocytopenia, unspecified: Secondary | ICD-10-CM | POA: Diagnosis not present

## 2016-10-22 DIAGNOSIS — D709 Neutropenia, unspecified: Secondary | ICD-10-CM | POA: Diagnosis not present

## 2016-10-27 DIAGNOSIS — E1165 Type 2 diabetes mellitus with hyperglycemia: Secondary | ICD-10-CM | POA: Diagnosis not present

## 2016-10-27 DIAGNOSIS — Z299 Encounter for prophylactic measures, unspecified: Secondary | ICD-10-CM | POA: Diagnosis not present

## 2016-10-27 DIAGNOSIS — E1142 Type 2 diabetes mellitus with diabetic polyneuropathy: Secondary | ICD-10-CM | POA: Diagnosis not present

## 2016-10-27 DIAGNOSIS — I639 Cerebral infarction, unspecified: Secondary | ICD-10-CM | POA: Diagnosis not present

## 2016-10-27 DIAGNOSIS — I10 Essential (primary) hypertension: Secondary | ICD-10-CM | POA: Diagnosis not present

## 2016-10-27 DIAGNOSIS — D473 Essential (hemorrhagic) thrombocythemia: Secondary | ICD-10-CM | POA: Diagnosis not present

## 2016-10-27 DIAGNOSIS — Z6827 Body mass index (BMI) 27.0-27.9, adult: Secondary | ICD-10-CM | POA: Diagnosis not present

## 2016-10-27 DIAGNOSIS — I6529 Occlusion and stenosis of unspecified carotid artery: Secondary | ICD-10-CM | POA: Diagnosis not present

## 2016-10-27 DIAGNOSIS — E7800 Pure hypercholesterolemia, unspecified: Secondary | ICD-10-CM | POA: Diagnosis not present

## 2016-11-03 DIAGNOSIS — I10 Essential (primary) hypertension: Secondary | ICD-10-CM | POA: Diagnosis not present

## 2016-11-03 DIAGNOSIS — E119 Type 2 diabetes mellitus without complications: Secondary | ICD-10-CM | POA: Diagnosis not present

## 2016-11-10 DIAGNOSIS — L400 Psoriasis vulgaris: Secondary | ICD-10-CM | POA: Diagnosis not present

## 2016-11-10 DIAGNOSIS — Z85828 Personal history of other malignant neoplasm of skin: Secondary | ICD-10-CM | POA: Diagnosis not present

## 2016-11-16 ENCOUNTER — Ambulatory Visit (HOSPITAL_COMMUNITY)
Admission: RE | Admit: 2016-11-16 | Discharge: 2016-11-16 | Disposition: A | Discharge status: Home / Self Care | Payer: Medicare Other | Source: Ambulatory Visit | Attending: Family | Admitting: Family

## 2016-11-16 ENCOUNTER — Ambulatory Visit (INDEPENDENT_AMBULATORY_CARE_PROVIDER_SITE_OTHER): Payer: Medicare Other | Admitting: Family

## 2016-11-16 ENCOUNTER — Encounter: Payer: Self-pay | Admitting: Family

## 2016-11-16 VITALS — BP 131/69 | HR 69 | Temp 98.6°F | Resp 20 | Ht 63.5 in | Wt 147.0 lb

## 2016-11-16 DIAGNOSIS — Z9889 Other specified postprocedural states: Secondary | ICD-10-CM | POA: Insufficient documentation

## 2016-11-16 DIAGNOSIS — I6523 Occlusion and stenosis of bilateral carotid arteries: Secondary | ICD-10-CM

## 2016-11-16 DIAGNOSIS — Z87891 Personal history of nicotine dependence: Secondary | ICD-10-CM | POA: Diagnosis not present

## 2016-11-16 LAB — VAS US CAROTID
LEFT ECA DIAS: -17 cm/s
LICADDIAS: -25 cm/s
LICAPDIAS: 18 cm/s
Left CCA dist dias: -16 cm/s
Left CCA dist sys: -62 cm/s
Left CCA prox dias: 18 cm/s
Left CCA prox sys: 108 cm/s
Left ICA dist sys: -75 cm/s
Left ICA prox sys: 88 cm/s
RCCAPDIAS: -17 cm/s
RIGHT CCA MID DIAS: 21 cm/s
RIGHT ECA DIAS: -13 cm/s
Right CCA prox sys: -110 cm/s
Right cca dist sys: -88 cm/s

## 2016-11-16 NOTE — Patient Instructions (Signed)
Stroke Prevention Some medical conditions and behaviors are associated with an increased chance of having a stroke. You may prevent a stroke by making healthy choices and managing medical conditions. How can I reduce my risk of having a stroke?  Stay physically active. Get at least 30 minutes of activity on most or all days.  Do not smoke. It may also be helpful to avoid exposure to secondhand smoke.  Limit alcohol use. Moderate alcohol use is considered to be: ? No more than 2 drinks per day for men. ? No more than 1 drink per day for nonpregnant women.  Eat healthy foods. This involves: ? Eating 5 or more servings of fruits and vegetables a day. ? Making dietary changes that address high blood pressure (hypertension), high cholesterol, diabetes, or obesity.  Manage your cholesterol levels. ? Making food choices that are high in fiber and low in saturated fat, trans fat, and cholesterol may control cholesterol levels. ? Take any prescribed medicines to control cholesterol as directed by your health care provider.  Manage your diabetes. ? Controlling your carbohydrate and sugar intake is recommended to manage diabetes. ? Take any prescribed medicines to control diabetes as directed by your health care provider.  Control your hypertension. ? Making food choices that are low in salt (sodium), saturated fat, trans fat, and cholesterol is recommended to manage hypertension. ? Ask your health care provider if you need treatment to lower your blood pressure. Take any prescribed medicines to control hypertension as directed by your health care provider. ? If you are 18-39 years of age, have your blood pressure checked every 3-5 years. If you are 40 years of age or older, have your blood pressure checked every year.  Maintain a healthy weight. ? Reducing calorie intake and making food choices that are low in sodium, saturated fat, trans fat, and cholesterol are recommended to manage  weight.  Stop drug abuse.  Avoid taking birth control pills. ? Talk to your health care provider about the risks of taking birth control pills if you are over 35 years old, smoke, get migraines, or have ever had a blood clot.  Get evaluated for sleep disorders (sleep apnea). ? Talk to your health care provider about getting a sleep evaluation if you snore a lot or have excessive sleepiness.  Take medicines only as directed by your health care provider. ? For some people, aspirin or blood thinners (anticoagulants) are helpful in reducing the risk of forming abnormal blood clots that can lead to stroke. If you have the irregular heart rhythm of atrial fibrillation, you should be on a blood thinner unless there is a good reason you cannot take them. ? Understand all your medicine instructions.  Make sure that other conditions (such as anemia or atherosclerosis) are addressed. Get help right away if:  You have sudden weakness or numbness of the face, arm, or leg, especially on one side of the body.  Your face or eyelid droops to one side.  You have sudden confusion.  You have trouble speaking (aphasia) or understanding.  You have sudden trouble seeing in one or both eyes.  You have sudden trouble walking.  You have dizziness.  You have a loss of balance or coordination.  You have a sudden, severe headache with no known cause.  You have new chest pain or an irregular heartbeat. Any of these symptoms may represent a serious problem that is an emergency. Do not wait to see if the symptoms will go away.   Get medical help at once. Call your local emergency services (911 in [place] Do not drive yourself to the hospital. This information is not intended to replace advice given to you by your health care provider. Make sure you discuss any questions you have with your health care provider. Document Released: 03/11/2004 Document Revised: 07/10/2015 Document Reviewed: 08/04/2012 Elsevier  Interactive Patient Education  2017 [employer]     Preventing Cerebrovascular Disease Arteries are blood vessels that carry blood that contains oxygen from the heart to all parts of the body. Cerebrovascular disease affects arteries that supply the brain. Any condition that blocks or disrupts blood flow to the brain can cause cerebrovascular disease. Brain cells that lose blood supply start to die within minutes (stroke). Stroke is the main danger of cerebrovascular disease. Atherosclerosis and high blood pressure are common causes of cerebrovascular disease. Atherosclerosis is narrowing and hardening of an artery that results when fat, cholesterol, calcium, or other substances (plaque) build up inside an artery. Plaque reduces blood flow through the artery. High blood pressure increases the risk of bleeding inside the brain. Making diet and lifestyle changes to prevent atherosclerosis and high blood pressure lowers your risk of cerebrovascular disease. What nutrition changes can be made?  Eat more fruits, vegetables, and whole grains.  Reduce how much saturated fat you eat. To do this, eat less red meat and fewer full-fat dairy products.  Eat healthy proteins instead of red meat. Healthy proteins include: ? Fish. Eat fish that contains heart-healthy omega-3 fatty acids, twice a week. Examples include salmon, albacore tuna, mackerel, and herring. ? Chicken. ? Nuts. ? Low-fat or nonfat yogurt.  Avoid processed meats, like bacon and lunchmeat.  Avoid foods that contain: ? A lot of sugar, such as sweets and drinks with added sugar. ? A lot of salt (sodium). Avoid adding extra salt to your food, as told by your health care provider. ? Trans fats, such as margarine and baked goods. Trans fats may be listed as "partially hydrogenated oils" on food labels.  Check food labels to see how much sodium, sugar, and trans fats are in foods.  Use vegetable oils that contain low amounts of  saturated fat, such as olive oil or canola oil. What lifestyle changes can be made?  Drink alcohol in moderation. This means no more than 1 drink a day for nonpregnant women and 2 drinks a day for men. One drink equals 12 oz of beer, 5 oz of wine, or 1 oz of hard liquor.  If you are overweight, ask your health care provider to recommend a weight-loss plan for you. Losing 5-10 lb (2.2-4.5 kg) can reduce your risk of diabetes, atherosclerosis, and high blood pressure.  Exercise for 30?60 minutes on most days, or as much as told by your health care provider. ? Do moderate-intensity exercise, such as brisk walking, bicycling, and water aerobics. Ask your health care provider which activities are safe for you.  Do not use any products that contain nicotine or tobacco, such as cigarettes and e-cigarettes. If you need help quitting, ask your health care provider. Why are these changes important? Making these changes lowers your risk of many diseases that can cause cerebrovascular disease and stroke. Stroke is a leading cause of death and disability. Making these changes also improves your overall health and quality of life. What can I do to lower my risk? The following factors make you more likely to develop cerebrovascular disease:  Being overweight.  Smoking.  Being physically inactive.    Eating a high-fat diet.  Having certain health conditions, such as: ? Diabetes. ? High blood pressure. ? Heart disease. ? Atherosclerosis. ? High cholesterol. ? Sickle cell disease.  Talk with your health care provider about your risk for cerebrovascular disease. Work with your health care provider to control diseases that you have that may contribute to cerebrovascular disease. Your health care provider may prescribe medicines to help prevent major causes of cerebrovascular disease. Where to find more information: Learn more about preventing cerebrovascular disease from:  National Heart, Lung, and  Blood Institute: www.nhlbi.nih.gov/health/health-topics/topics/stroke  Centers for Disease Control and Prevention: cdc.gov/stroke/about.htm  Summary  Cerebrovascular disease can lead to a stroke.  Atherosclerosis and high blood pressure are major causes of cerebrovascular disease.  Making diet and lifestyle changes can reduce your risk of cerebrovascular disease.  Work with your health care provider to get your risk factors under control to reduce your risk of cerebrovascular disease. This information is not intended to replace advice given to you by your health care provider. Make sure you discuss any questions you have with your health care provider. Document Released: 02/16/2015 Document Revised: 08/22/2015 Document Reviewed: 02/16/2015 Elsevier Interactive Patient Education  2018 [employer]  

## 2016-11-16 NOTE — Progress Notes (Signed)
Chief Complaint: Follow up Extracranial Carotid Artery Stenosis   History of Present Illness  Jeanette Chang is a 76 y.o. female patient of Dr. Donnetta Hutching who isstatus post left carotid endarterectomy in [place] in 2005, and right carotid endarterectomy by Dr. Amedeo Plenty in 2009. She has remained asymptomatic from this. July of 2015 she had a right neck tumor resection and had a slow recovery from this, she has mild aphasia and partial loss of right eye vision since this which she states is improving.  She states she may have had a TIA years ago as manifested by a headache, but she also reports recurrent right sided headaches. She denies any history of hemiparesis.  She has a history of recurrent benign colon polyps. Her mother had a history of colon cancer. Her main concerns are bursitis in her right shoulder and arthritis in both hips.  Her hips pain limits her walking, she denies claudication type symptoms, denies non healing wounds.  She has known HNP with radiculopathy sx's, states ESI's have helped her pain significantly.  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   She sees Dr. Tasia Catchings and Dr. Lonia Chimera at [facility] for thrombocytopenia, leukopenia, and IDA.   She also has psoriasis.   Pt Diabetic: yes, states her fasting home glucose readings are 70-120, possibly she thinks her last A1C was 6.5 Pt smoker: former smoker, quit in 1994  Pt meds include: Statin : no, statins cause severe headaches and severe weakness ASA: yes Other anticoagulants/antiplatelets: no   Past Medical History:  Diagnosis Date  . Abnormal LFTs   . Achilles bursitis or tendinitis   . Acute maxillary sinusitis   . Acute pharyngitis   . Anemia, unspecified   . Candidiasis of skin and nails   . Dermatophytosis of foot   . Dermatophytosis of the body   . Diverticulitis of colon (without mention of hemorrhage)(562.11)   . Edema   . Headache(784.0)   . Malaise and fatigue   . Occlusion and  stenosis of carotid artery without mention of cerebral infarction   . Osteoarthrosis, unspecified whether generalized or localized, unspecified site   . Other dyspnea and respiratory abnormality   . Other psoriasis   . Other specified disorders of rotator cuff syndrome of shoulder and allied disorders   . Pain in joint   . Pure hypercholesterolemia   . RUQ pain   . Spasm of back muscles   . Thrombocytopenia, unspecified ([place])   . Type II or unspecified type diabetes mellitus without mention of complication, not stated as uncontrolled   . Unspecified essential hypertension   . Unspecified sinusitis (chronic)   . Urinary incontinence     Social History Social History  Substance Use Topics  . Smoking status: Former Smoker    Types: Cigarettes  . Smokeless tobacco: Former Systems developer    Quit date: 12/16/1992  . Alcohol use 0.6 oz/week    1 Glasses of wine per week    Family History Family History  Problem Relation Age of Onset  . Heart attack Mother   . Cancer Mother   . Heart attack Father   . Cancer Sister     Surgical History Past Surgical History:  Procedure Laterality Date  . APPENDECTOMY  1964  . CAROTID ENDARTERECTOMY  2005   Left CEA  . CAROTID ENDARTERECTOMY  2009   Right CEA  . CHOLECYSTECTOMY  1982   Gall Bladder  . COLONOSCOPY  09  . NOSE SURGERY  1978  .  PARATHYROIDECTOMY  1992  . Power port  03/06/2009   Right upper chest   . TONSILLECTOMY    . TUMOR EXCISION Right August 24, 2013   [facility] Dr. Ellen Henri, ENT  . VAGINAL HYSTERECTOMY  1972    Allergies  Allergen Reactions  . Codeine Nausea And Vomiting and Rash  . Niacin And Related Other (See Comments)    Headaches, myalgias  . Red Yeast Rice Other (See Comments)    Headaches, myalgias  . Actos [Pioglitazone Hydrochloride]   . Amaryl   . Sanctura [Trospium Chloride]   . Statins Other (See Comments)    Severe headache,  Hips and leg weakness that cause patient not to be able  to function as per patient.  Lisbeth Ply [Fesoterodine Fumarate]   . Penicillins Rash  . Ultram [Tramadol Hcl] Nausea And Vomiting    Current Outpatient Prescriptions  Medication Sig Dispense Refill  . aspirin 81 MG tablet Take 81 mg by mouth daily.      Marland Kitchen atenolol (TENORMIN) 50 MG tablet Take 50 mg by mouth daily.      . Canagliflozin (INVOKANA) 300 MG TABS Take by mouth daily.    . clobetasol ointment (TEMOVATE) 0.05 % Apply 0.05 % topically as needed.    . folic acid (FOLVITE) 1 MG tablet Take by mouth.    Marland Kitchen glimepiride (AMARYL) 4 MG tablet Take 4 mg by mouth daily.    Marland Kitchen lisinopril (PRINIVIL,ZESTRIL) 40 MG tablet Take 40 mg by mouth daily.    . metFORMIN (GLUCOPHAGE) 500 MG tablet Take 500 mg by mouth 2 (two) times daily with a meal.      . acetaminophen (TYLENOL) 500 MG tablet Take by mouth.    Marland Kitchen amLODipine (NORVASC) 2.5 MG tablet Take 2.5 mg by mouth daily.    Marland Kitchen HYDROcodone-acetaminophen (NORCO) 7.5-325 MG tablet Take by mouth.    . triamcinolone cream (KENALOG) 0.1 % Apply 0.1 % topically Twice daily.     No current facility-administered medications for this visit.     Review of Systems : See HPI for pertinent positives and negatives.  Physical Examination  Vitals:   11/16/16 1104 11/16/16 1109  BP: (!) 144/72 131/69  Pulse: 69   Resp: 20   Temp: 98.6 F (37 C)   TempSrc: Oral   SpO2: 95%   Weight: 147 lb (66.7 kg)   Height: 5' 3.5" (1.613 m)    Body mass index is 25.63 kg/m.  General: WDWN female in NAD GAIT:normal, slightly antalgic Eyes: PERRLA Pulmonary: Respirations are non-labored, CTAB, good air movement in all fields.   Cardiac: regular rhythm, no detected murmur.  VASCULAR EXAM Carotid Bruits Right Left   Negative Negative   Abdominal aortic pulse is not palpable. Radial pulses are 2+ palpable and equal.   LE Pulses Right Left  POPLITEAL not palpable not palpable  POSTERIOR TIBIAL 2+ palpable 2+  palpable  DORSALIS PEDIS ANTERIOR TIBIAL 1+ palpable 1+ palpable    Gastrointestinal: soft, nontender, BS WNL, no r/g, small incisional hernia (from cholecystectomy).  Musculoskeletal: no muscle atrophy/wasting. M/S 5/5 in upper extremities, 4/5 in LE's, extremities without ischemic changes. Psoriasis patches on elbows. Left foot is dry, pruritic, slightly irritated appearing on the sole and left toes.  Neurologic: A&O X 3; Appropriate Affect, speech is normal and loud, loquacious. CN 2-12 intact, pain and light touch intact in extremities, motor exam as listed. above    Assessment: Jeanette Chang is a 76 y.o. female wwho  isstatus post left carotid endarterectomy in [place] in 2005, and right carotid endarterectomy by Dr. Amedeo Plenty in 2009. July of 2015 she had a right neck tumor resection and had a slow recovery from this, she has mild aphasia and partial loss of right eye vision since this which she states is improving.  She states she may have had a TIA years ago as manifested by a headache, but she also has recurrent right sided headaches. She denies any history of hemiparesis. She has had no subsequent neurological events.   DATA Carotid Duplex (11/16/16): Patent bilateral carotid endarterectomy without evidence for restenosis. No significant stenosis of the bilateral ECA or CCA.  Bilateral vertebral artery flow is antegrade.  Bilateral subclavian artery waveforms are normal.  No significant change since exams of 10/02/2013, 10/29/14, and 11/04/15.   Plan: Follow-up in 18 months with Carotid Duplex scan.   I discussed in depth with the patient the nature of atherosclerosis, and emphasized the importance of maximal medical management including strict control of blood pressure, blood glucose, and lipid levels, obtaining regular exercise, and continued cessation of smoking.  The patient is aware that without maximal medical management the underlying atherosclerotic  disease process will progress, limiting the benefit of any interventions. The patient was given information about stroke prevention and what symptoms should prompt the patient to seek immediate medical care. Thank you for allowing [place] to participate in this patient's care.  Clemon Chambers, RN, MSN, FNP-C [facility] Office: [phone]  Clinic Physician: Early  11/16/16 11:25 AM

## 2016-11-24 DIAGNOSIS — E119 Type 2 diabetes mellitus without complications: Secondary | ICD-10-CM | POA: Diagnosis not present

## 2016-11-24 DIAGNOSIS — I10 Essential (primary) hypertension: Secondary | ICD-10-CM | POA: Diagnosis not present

## 2016-11-29 DIAGNOSIS — Z23 Encounter for immunization: Secondary | ICD-10-CM | POA: Diagnosis not present

## 2016-12-02 DIAGNOSIS — D709 Neutropenia, unspecified: Secondary | ICD-10-CM | POA: Diagnosis not present

## 2016-12-02 DIAGNOSIS — D696 Thrombocytopenia, unspecified: Secondary | ICD-10-CM | POA: Diagnosis not present

## 2016-12-06 DIAGNOSIS — R5382 Chronic fatigue, unspecified: Secondary | ICD-10-CM | POA: Diagnosis not present

## 2016-12-06 DIAGNOSIS — D509 Iron deficiency anemia, unspecified: Secondary | ICD-10-CM | POA: Diagnosis not present

## 2016-12-06 DIAGNOSIS — E1169 Type 2 diabetes mellitus with other specified complication: Secondary | ICD-10-CM | POA: Diagnosis not present

## 2016-12-06 DIAGNOSIS — E538 Deficiency of other specified B group vitamins: Secondary | ICD-10-CM | POA: Diagnosis not present

## 2016-12-06 DIAGNOSIS — Z6827 Body mass index (BMI) 27.0-27.9, adult: Secondary | ICD-10-CM | POA: Diagnosis not present

## 2016-12-06 DIAGNOSIS — K769 Liver disease, unspecified: Secondary | ICD-10-CM | POA: Diagnosis not present

## 2016-12-06 DIAGNOSIS — D72819 Decreased white blood cell count, unspecified: Secondary | ICD-10-CM | POA: Diagnosis not present

## 2016-12-06 DIAGNOSIS — M1990 Unspecified osteoarthritis, unspecified site: Secondary | ICD-10-CM | POA: Diagnosis not present

## 2016-12-06 DIAGNOSIS — D696 Thrombocytopenia, unspecified: Secondary | ICD-10-CM | POA: Diagnosis not present

## 2016-12-06 DIAGNOSIS — R161 Splenomegaly, not elsewhere classified: Secondary | ICD-10-CM | POA: Diagnosis not present

## 2016-12-09 DIAGNOSIS — I2510 Atherosclerotic heart disease of native coronary artery without angina pectoris: Secondary | ICD-10-CM | POA: Diagnosis not present

## 2016-12-09 DIAGNOSIS — K760 Fatty (change of) liver, not elsewhere classified: Secondary | ICD-10-CM | POA: Diagnosis not present

## 2016-12-09 DIAGNOSIS — R161 Splenomegaly, not elsewhere classified: Secondary | ICD-10-CM | POA: Diagnosis not present

## 2016-12-09 DIAGNOSIS — I700 Atherosclerosis of aorta: Secondary | ICD-10-CM | POA: Diagnosis not present

## 2016-12-19 DIAGNOSIS — E538 Deficiency of other specified B group vitamins: Secondary | ICD-10-CM | POA: Insufficient documentation

## 2016-12-20 DIAGNOSIS — M160 Bilateral primary osteoarthritis of hip: Secondary | ICD-10-CM | POA: Diagnosis not present

## 2016-12-20 DIAGNOSIS — E611 Iron deficiency: Secondary | ICD-10-CM | POA: Diagnosis not present

## 2016-12-20 DIAGNOSIS — E538 Deficiency of other specified B group vitamins: Secondary | ICD-10-CM | POA: Diagnosis not present

## 2016-12-20 DIAGNOSIS — Z7982 Long term (current) use of aspirin: Secondary | ICD-10-CM | POA: Diagnosis not present

## 2016-12-20 DIAGNOSIS — K7460 Unspecified cirrhosis of liver: Secondary | ICD-10-CM | POA: Diagnosis not present

## 2016-12-20 DIAGNOSIS — E1151 Type 2 diabetes mellitus with diabetic peripheral angiopathy without gangrene: Secondary | ICD-10-CM | POA: Diagnosis not present

## 2016-12-20 DIAGNOSIS — G2581 Restless legs syndrome: Secondary | ICD-10-CM | POA: Diagnosis not present

## 2016-12-20 DIAGNOSIS — R161 Splenomegaly, not elsewhere classified: Secondary | ICD-10-CM | POA: Diagnosis not present

## 2016-12-20 DIAGNOSIS — I10 Essential (primary) hypertension: Secondary | ICD-10-CM | POA: Diagnosis not present

## 2016-12-20 DIAGNOSIS — Z6827 Body mass index (BMI) 27.0-27.9, adult: Secondary | ICD-10-CM | POA: Diagnosis not present

## 2016-12-20 DIAGNOSIS — N3943 Post-void dribbling: Secondary | ICD-10-CM | POA: Diagnosis not present

## 2016-12-20 DIAGNOSIS — I679 Cerebrovascular disease, unspecified: Secondary | ICD-10-CM | POA: Diagnosis not present

## 2016-12-20 DIAGNOSIS — D72819 Decreased white blood cell count, unspecified: Secondary | ICD-10-CM | POA: Diagnosis not present

## 2016-12-20 DIAGNOSIS — I6529 Occlusion and stenosis of unspecified carotid artery: Secondary | ICD-10-CM | POA: Diagnosis not present

## 2016-12-20 DIAGNOSIS — D696 Thrombocytopenia, unspecified: Secondary | ICD-10-CM | POA: Diagnosis not present

## 2016-12-20 DIAGNOSIS — Z87891 Personal history of nicotine dependence: Secondary | ICD-10-CM | POA: Diagnosis not present

## 2016-12-20 DIAGNOSIS — E1142 Type 2 diabetes mellitus with diabetic polyneuropathy: Secondary | ICD-10-CM | POA: Diagnosis not present

## 2016-12-20 DIAGNOSIS — R5382 Chronic fatigue, unspecified: Secondary | ICD-10-CM | POA: Diagnosis not present

## 2016-12-20 DIAGNOSIS — K7581 Nonalcoholic steatohepatitis (NASH): Secondary | ICD-10-CM | POA: Diagnosis not present

## 2016-12-20 DIAGNOSIS — Z7984 Long term (current) use of oral hypoglycemic drugs: Secondary | ICD-10-CM | POA: Diagnosis not present

## 2016-12-20 DIAGNOSIS — L409 Psoriasis, unspecified: Secondary | ICD-10-CM | POA: Diagnosis not present

## 2016-12-20 DIAGNOSIS — Z1159 Encounter for screening for other viral diseases: Secondary | ICD-10-CM | POA: Diagnosis not present

## 2016-12-20 DIAGNOSIS — Z136 Encounter for screening for cardiovascular disorders: Secondary | ICD-10-CM | POA: Diagnosis not present

## 2016-12-20 DIAGNOSIS — Z7289 Other problems related to lifestyle: Secondary | ICD-10-CM | POA: Diagnosis not present

## 2016-12-22 DIAGNOSIS — E119 Type 2 diabetes mellitus without complications: Secondary | ICD-10-CM | POA: Diagnosis not present

## 2016-12-22 DIAGNOSIS — I10 Essential (primary) hypertension: Secondary | ICD-10-CM | POA: Diagnosis not present

## 2016-12-27 ENCOUNTER — Other Ambulatory Visit (HOSPITAL_COMMUNITY)
Admission: RE | Admit: 2016-12-27 | Discharge: 2016-12-27 | Disposition: A | Discharge status: Home / Self Care | Payer: Medicare Other | Source: Ambulatory Visit | Attending: Hematology and Oncology | Admitting: Hematology and Oncology

## 2016-12-27 DIAGNOSIS — D7589 Other specified diseases of blood and blood-forming organs: Secondary | ICD-10-CM | POA: Diagnosis not present

## 2016-12-27 DIAGNOSIS — R161 Splenomegaly, not elsewhere classified: Secondary | ICD-10-CM | POA: Diagnosis not present

## 2016-12-27 DIAGNOSIS — D696 Thrombocytopenia, unspecified: Secondary | ICD-10-CM | POA: Diagnosis not present

## 2016-12-27 DIAGNOSIS — N6011 Diffuse cystic mastopathy of right breast: Secondary | ICD-10-CM | POA: Insufficient documentation

## 2016-12-27 DIAGNOSIS — D72819 Decreased white blood cell count, unspecified: Secondary | ICD-10-CM | POA: Diagnosis not present

## 2016-12-27 DIAGNOSIS — R921 Mammographic calcification found on diagnostic imaging of breast: Secondary | ICD-10-CM | POA: Diagnosis not present

## 2016-12-27 IMAGING — US US ABDOMEN COMPLETE
1 series · 13 of 25 positions shown · non-contrast
Comparison: Abdominal ultrasound [DATE] and abdominal
and pelvic CT scan [DATE] and abdominal ultrasound [DATE].

CLINICAL DATA: Nonalcoholic steatohepatitis, cirrhosis, previous
cholecystectomy.

EXAM:
ABDOMEN ULTRASOUND COMPLETE

[Series 1: us abdomen complete · 0.22mm/px · 13 of 112 slices shown]
[im 1/112]
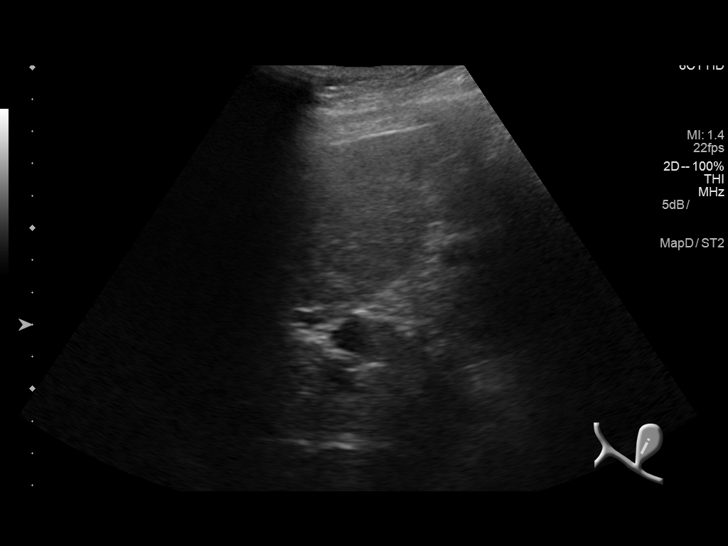
[im 10/112]
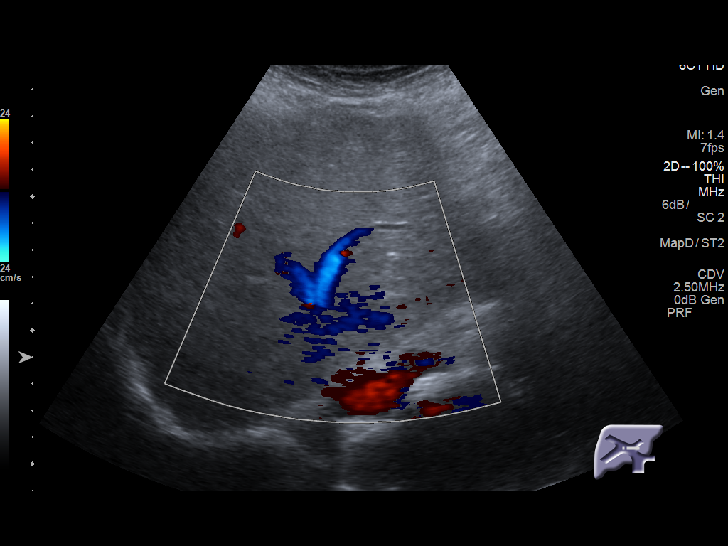
[im 19/112]
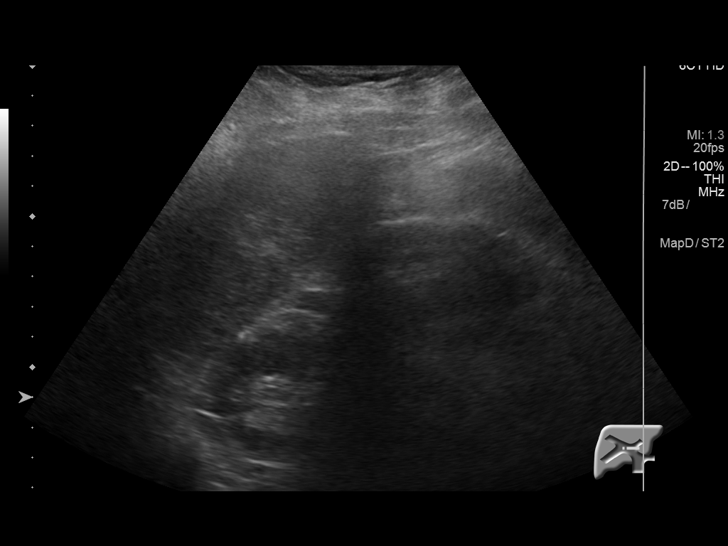
[im 28/112]
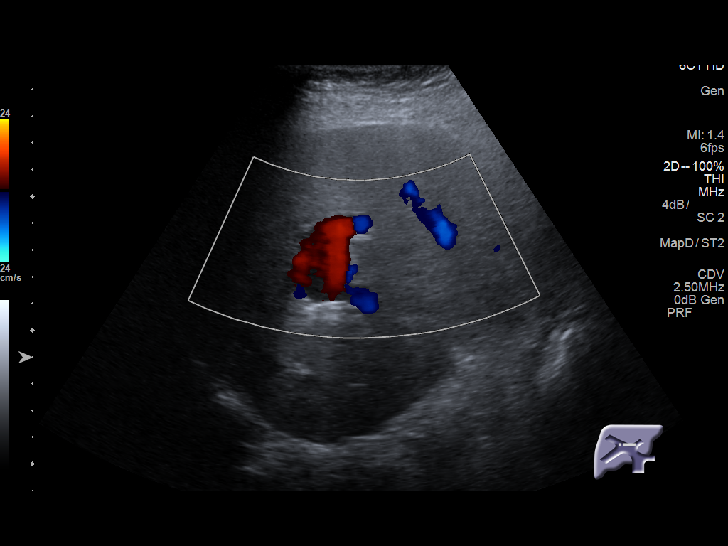
[im 38/112]
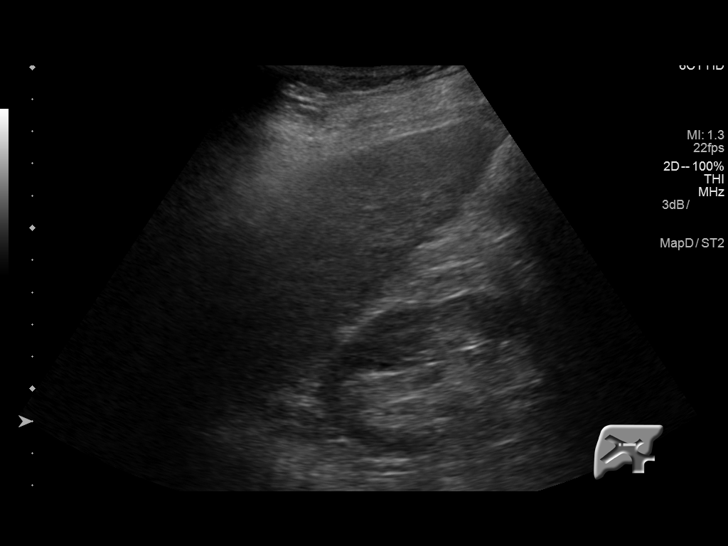
[im 47/112]
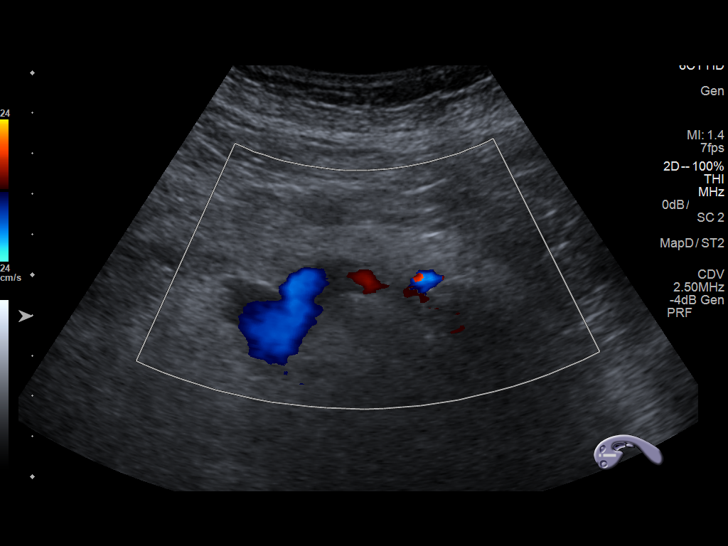
[im 56/112]
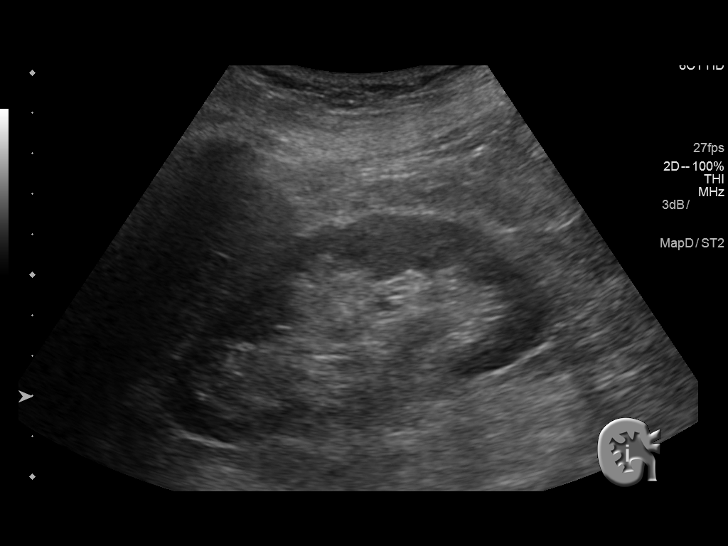
[im 65/112]
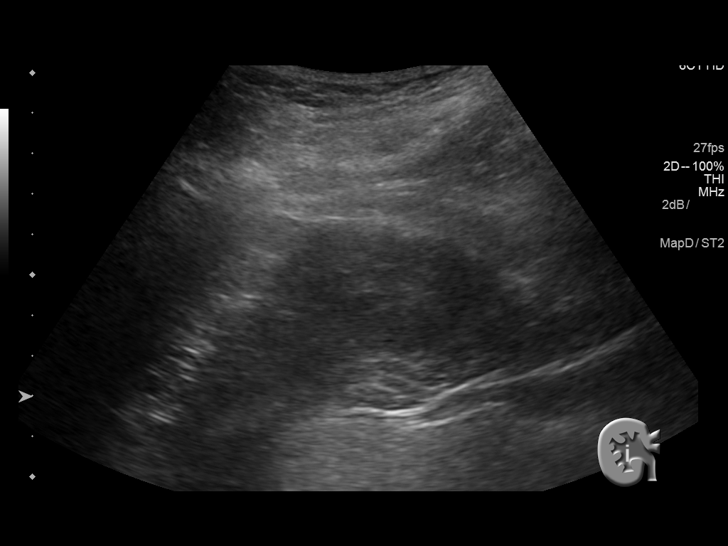
[im 75/112]
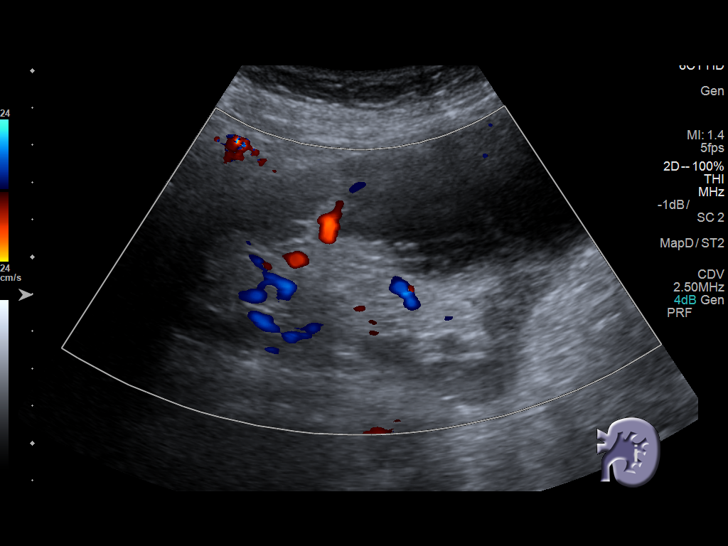
[im 84/112]
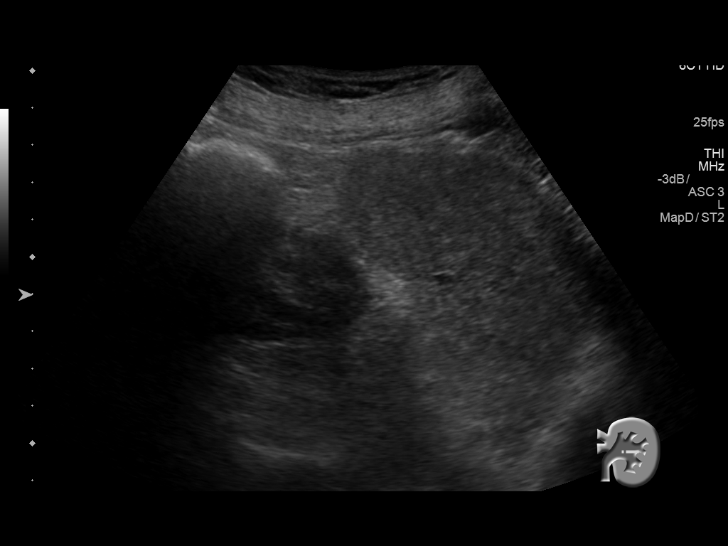
[im 93/112]
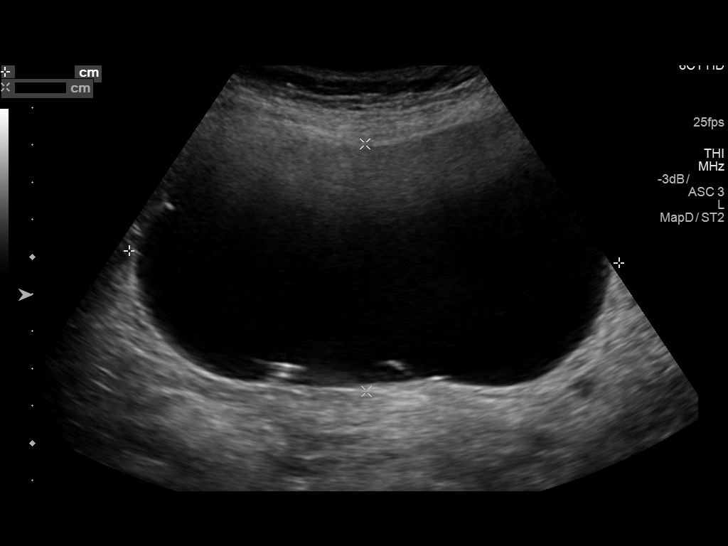
[im 102/112]
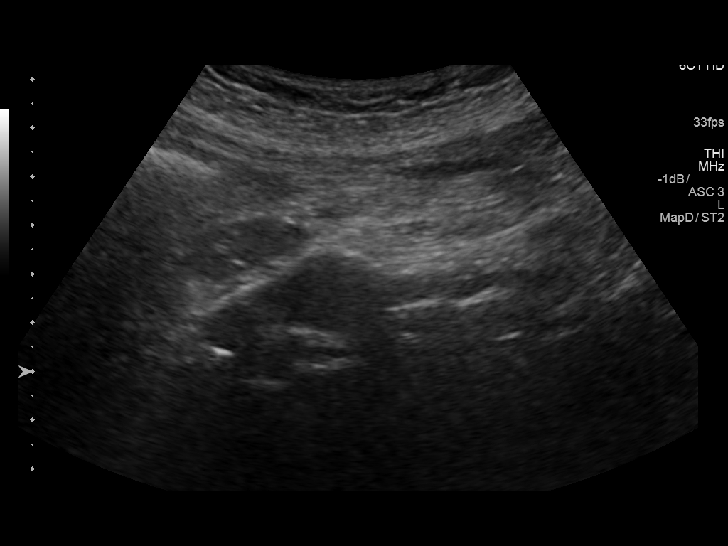
[im 112/112]
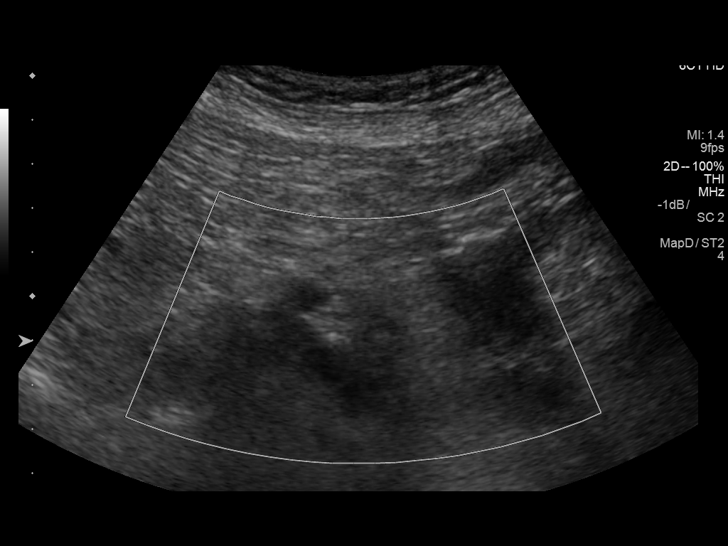

[13 of 25 positions shown; findings below may reference images not displayed]

FINDINGS: Gallbladder: The gallbladder is surgically absent.

Common bile duct: Diameter: 3.8 mm

Liver: The hepatic echotexture is increased diffusely. The surface
contour is minimally irregular. There is no discrete mass nor ductal
dilation. Portal vein is patent on color Doppler imaging with normal
direction of blood flow towards the liver.

IVC: Visualization of the IVC is limited.

Pancreas: Visualization of the pancreatic head and tail is limited.
The pancreatic body is grossly normal.

Spleen: The spleen exhibits normal echotexture and measures 11.5 cm
in length.

Right Kidney: Length: 10.4 cm. Echogenicity within normal limits. No
mass or hydronephrosis visualized.

Left Kidney: Length: 10.4 cm. There is a large cyst arising from the
mid and lower pole of the left kidney. A few internal echoes are
observed. This is similar to that seen on the previous study. The
cyst measures 3.2 x 6.6 x 11.1 cm. There is no hydronephrosis.

Abdominal aorta: The abdominal aorta exhibits a tapering caliber
measuring 2.4 cm proximally and 2.1 cm distally.

Other findings: None.
IMPRESSION: Increased hepatic echotexture compatible with hepatocellular
disease. Subtle surface contour irregularity is consistent with
cirrhosis. No suspicious hepatic masses are observed. No ascites or
splenomegaly.

Large partially septated cystic structure arising from the mid and
lower pole of the left kidney appears stable.

## 2017-01-07 DIAGNOSIS — I10 Essential (primary) hypertension: Secondary | ICD-10-CM | POA: Diagnosis not present

## 2017-01-07 DIAGNOSIS — E7800 Pure hypercholesterolemia, unspecified: Secondary | ICD-10-CM | POA: Diagnosis not present

## 2017-01-07 DIAGNOSIS — Z6826 Body mass index (BMI) 26.0-26.9, adult: Secondary | ICD-10-CM | POA: Diagnosis not present

## 2017-01-07 DIAGNOSIS — E1165 Type 2 diabetes mellitus with hyperglycemia: Secondary | ICD-10-CM | POA: Diagnosis not present

## 2017-01-07 DIAGNOSIS — E538 Deficiency of other specified B group vitamins: Secondary | ICD-10-CM | POA: Diagnosis not present

## 2017-01-07 DIAGNOSIS — Z299 Encounter for prophylactic measures, unspecified: Secondary | ICD-10-CM | POA: Diagnosis not present

## 2017-01-11 DIAGNOSIS — D509 Iron deficiency anemia, unspecified: Secondary | ICD-10-CM | POA: Diagnosis not present

## 2017-01-12 DIAGNOSIS — E538 Deficiency of other specified B group vitamins: Secondary | ICD-10-CM | POA: Diagnosis not present

## 2017-01-12 DIAGNOSIS — K7460 Unspecified cirrhosis of liver: Secondary | ICD-10-CM | POA: Diagnosis not present

## 2017-01-12 DIAGNOSIS — E1169 Type 2 diabetes mellitus with other specified complication: Secondary | ICD-10-CM | POA: Diagnosis not present

## 2017-01-12 DIAGNOSIS — D696 Thrombocytopenia, unspecified: Secondary | ICD-10-CM | POA: Diagnosis not present

## 2017-01-12 DIAGNOSIS — K769 Liver disease, unspecified: Secondary | ICD-10-CM | POA: Diagnosis not present

## 2017-01-12 DIAGNOSIS — Z6827 Body mass index (BMI) 27.0-27.9, adult: Secondary | ICD-10-CM | POA: Diagnosis not present

## 2017-01-12 DIAGNOSIS — D509 Iron deficiency anemia, unspecified: Secondary | ICD-10-CM | POA: Diagnosis not present

## 2017-01-12 DIAGNOSIS — K219 Gastro-esophageal reflux disease without esophagitis: Secondary | ICD-10-CM | POA: Diagnosis not present

## 2017-01-12 DIAGNOSIS — R5382 Chronic fatigue, unspecified: Secondary | ICD-10-CM | POA: Diagnosis not present

## 2017-01-13 ENCOUNTER — Encounter (HOSPITAL_COMMUNITY): Payer: Self-pay

## 2017-01-19 DIAGNOSIS — D509 Iron deficiency anemia, unspecified: Secondary | ICD-10-CM | POA: Diagnosis not present

## 2017-02-02 ENCOUNTER — Encounter (INDEPENDENT_AMBULATORY_CARE_PROVIDER_SITE_OTHER): Payer: Self-pay | Admitting: Internal Medicine

## 2017-02-03 LAB — CHROMOSOME ANALYSIS, BONE MARROW

## 2017-02-07 DIAGNOSIS — I10 Essential (primary) hypertension: Secondary | ICD-10-CM | POA: Diagnosis not present

## 2017-02-07 DIAGNOSIS — E119 Type 2 diabetes mellitus without complications: Secondary | ICD-10-CM | POA: Diagnosis not present

## 2017-02-10 ENCOUNTER — Ambulatory Visit (INDEPENDENT_AMBULATORY_CARE_PROVIDER_SITE_OTHER): Payer: Medicare Other | Admitting: Internal Medicine

## 2017-02-10 ENCOUNTER — Encounter (INDEPENDENT_AMBULATORY_CARE_PROVIDER_SITE_OTHER): Payer: Self-pay

## 2017-02-10 ENCOUNTER — Encounter (INDEPENDENT_AMBULATORY_CARE_PROVIDER_SITE_OTHER): Payer: Self-pay | Admitting: *Deleted

## 2017-02-10 ENCOUNTER — Encounter (INDEPENDENT_AMBULATORY_CARE_PROVIDER_SITE_OTHER): Payer: Self-pay | Admitting: Internal Medicine

## 2017-02-10 VITALS — BP 130/62 | HR 66 | Temp 98.1°F | Ht 66.0 in | Wt 144.1 lb

## 2017-02-10 DIAGNOSIS — Z23 Encounter for immunization: Secondary | ICD-10-CM | POA: Diagnosis not present

## 2017-02-10 DIAGNOSIS — I6523 Occlusion and stenosis of bilateral carotid arteries: Secondary | ICD-10-CM

## 2017-02-10 DIAGNOSIS — K760 Fatty (change of) liver, not elsewhere classified: Secondary | ICD-10-CM

## 2017-02-10 DIAGNOSIS — D696 Thrombocytopenia, unspecified: Secondary | ICD-10-CM

## 2017-02-10 DIAGNOSIS — D508 Other iron deficiency anemias: Secondary | ICD-10-CM

## 2017-02-10 NOTE — Progress Notes (Addendum)
Subjective:    Patient ID: Jeanette Chang, female    DOB: [birth year], 76 y.o.   MRN: [id number]  HPI Referred by [facility]  (Dr Denyse Amass) for  IDA, GERD.  Hx of same and underwent Given's capsule in 2011. Her appetite  is good. She denies GERD. No weight loss. She says she can eat anything she wants.  She has a BM daily.  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 Followed the [facility] at [facility] [place].  Hx of diabetes since the 90s. Per Dr. Jacquiline Doe, has a hx of cirrhosis/NAFLD.  Recently underwent a bone marrow aspirate (per records) which revealed mildly hypercellular bone marrow with normal trilineage hematopoesis. Abundant  Megakaryocytes with generally normal morphology. No significant dyspoiesis or increase in immature elements identified. The findings are non specific and likely secondary in nature.   Received an iron transfusion 1st of this month. She states she get periodic iron infusions.         12/24/2016 Hepatitis B and C negative.   01/11/2017 Ferritin 17, H and H 11.5 and 37.7, MCV 92.2 Platelet ct 69.   12/09/2016 CT abdomen/pelvis with CM: There is moderate splenomegaly present. No splenic lesion is seen.  Some thickening the mucosa of the distal esophagus could be due to reflux esophagitis or possible small hernia. Some nodularity of the contours of the liver may indicate cirrhosis.  03/26/2014 Colonoscopy (Dr. Anthony Sar): family hx of colon cancer and personal hx of colon polyps. Normal colonoscopy.   07/20/2009 Small bowel Given Capsule: IDA and heme positive stool Antral gastritis with single gastric erosion. Multiple punctate telangiectasia scattered thru out the small bowel along with a scar.    Review of Systems Past Medical History:  Diagnosis Date  . Abnormal LFTs   . Achilles bursitis or tendinitis   . Acute maxillary sinusitis   . Acute pharyngitis   . Anemia, unspecified   . Candidiasis of  skin and nails   . Dermatophytosis of foot   . Dermatophytosis of the body   . Diverticulitis of colon (without mention of hemorrhage)(562.11)   . Edema   . Headache(784.0)   . Malaise and fatigue   . 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 Osteoarthrosis, unspecified whether generalized or localized, unspecified site   . Other dyspnea and respiratory abnormality   . Other psoriasis   . Other specified disorders of rotator cuff syndrome of shoulder and allied disorders   . Pain in joint   . Pure hypercholesterolemia   . RUQ pain   . Spasm of back muscles   . Thrombocytopenia, unspecified ([place])   . Type II or unspecified type diabetes mellitus without mention of complication, not stated as uncontrolled   . Unspecified essential hypertension   . Unspecified sinusitis (chronic)   . Urinary incontinence     Past Surgical History:  Procedure Laterality Date  . APPENDECTOMY  1964  . CAROTID ENDARTERECTOMY  2005   Left CEA  . CAROTID ENDARTERECTOMY  2009   Right CEA  . CHOLECYSTECTOMY  1982   Gall Bladder  . COLONOSCOPY  09  . NOSE SURGERY  1978  . PARATHYROIDECTOMY  1992  . Power port  03/06/2009   Right upper chest   . TONSILLECTOMY    . TUMOR EXCISION Right August 24, 2013   [facility] Dr. Ellen Henri, ENT  . VAGINAL HYSTERECTOMY  1972    Allergies  Allergen Reactions  . Codeine Nausea And Vomiting and Rash  . Niacin And Related Other (See Comments)    Headaches, myalgias  . Red Yeast Rice Other (See Comments)    Headaches, myalgias  . Actos [Pioglitazone Hydrochloride]   . Amaryl   . Sanctura [Trospium Chloride]   . Statins Other (See Comments)    Severe headache,  Hips and leg weakness that cause patient not to be able to function as per patient.  Lisbeth Ply [Fesoterodine Fumarate]   . Penicillins Rash  . Ultram [Tramadol Hcl] Nausea And Vomiting    Current Outpatient Medications on File Prior to  Visit  Medication Sig Dispense Refill  . acetaminophen (TYLENOL) 500 MG tablet Take by mouth.    Marland Kitchen aspirin 81 MG tablet Take 81 mg by mouth daily.      Marland Kitchen atenolol (TENORMIN) 50 MG tablet Take 50 mg by mouth daily.      . canagliflozin (INVOKANA) 300 MG TABS tablet Take 300 mg by mouth daily before breakfast.    . Canagliflozin (INVOKANA) 300 MG TABS Take by mouth daily.    . clobetasol ointment (TEMOVATE) 0.05 % Apply 0.05 % topically as needed.    . folic acid (FOLVITE) 1 MG tablet Take by mouth 2 (two) times daily.     Marland Kitchen glimepiride (AMARYL) 4 MG tablet Take 4 mg by mouth daily.    Marland Kitchen HYDROcodone-acetaminophen (NORCO) 7.5-325 MG tablet Take by mouth.    Marland Kitchen lisinopril (PRINIVIL,ZESTRIL) 40 MG tablet Take 40 mg by mouth daily.    . metFORMIN (GLUCOPHAGE) 500 MG tablet Take 500 mg by mouth 2 (two) times daily with a meal.       No current facility-administered medications on file prior to visit.         Objective:   Physical Exam Blood pressure 130/62, pulse 66, temperature 98.1 F (36.7 C), height 5' 6"  (1.676 m), weight 144 lb 1.6 oz (65.4 kg). Alert and oriented. Skin warm and dry. Oral mucosa is moist.   . Sclera anicteric, conjunctivae is pink. Thyroid not enlarged. No cervical lymphadenopathy. Lungs clear. Heart regular rate and rhythm.  Abdomen is soft. Bowel sounds are positive. No hepatomegaly. No abdominal masses felt. No tenderness.  No edema to lower extremities         Assessment & Plan:  IDA.  CBC, Iron studies. Thrombocytopenia. CBC. Hepatic function.  Further recommendations to follow.

## 2017-02-10 NOTE — Patient Instructions (Signed)
Labs, US abdomen.

## 2017-02-11 LAB — CBC WITH DIFFERENTIAL/PLATELET
Basophils Absolute: 21 cells/uL (ref 0–200)
Basophils Relative: 0.7 %
Eosinophils Absolute: 51 cells/uL (ref 15–500)
Eosinophils Relative: 1.7 %
HEMATOCRIT: 37.3 % (ref 35.0–45.0)
HEMOGLOBIN: 12.5 g/dL (ref 11.7–15.5)
LYMPHS ABS: 1257 {cells}/uL (ref 850–3900)
MCH: 29.2 pg (ref 27.0–33.0)
MCHC: 33.5 g/dL (ref 32.0–36.0)
MCV: 87.1 fL (ref 80.0–100.0)
MPV: 11.8 fL (ref 7.5–12.5)
Monocytes Relative: 6.3 %
NEUTROS PCT: 49.4 %
Neutro Abs: 1482 cells/uL — ABNORMAL LOW (ref 1500–7800)
Platelets: 70 10*3/uL — ABNORMAL LOW (ref 140–400)
RBC: 4.28 10*6/uL (ref 3.80–5.10)
RDW: 17 % — ABNORMAL HIGH (ref 11.0–15.0)
Total Lymphocyte: 41.9 %
WBC: 3 10*3/uL — AB (ref 3.8–10.8)
WBCMIX: 189 {cells}/uL — AB (ref 200–950)

## 2017-02-11 LAB — HEPATIC FUNCTION PANEL
AG RATIO: 1.6 (calc) (ref 1.0–2.5)
ALBUMIN MSPROF: 4.2 g/dL (ref 3.6–5.1)
ALT: 31 U/L — AB (ref 6–29)
AST: 40 U/L — ABNORMAL HIGH (ref 10–35)
Alkaline phosphatase (APISO): 73 U/L (ref 33–130)
BILIRUBIN INDIRECT: 0.4 mg/dL (ref 0.2–1.2)
Bilirubin, Direct: 0.1 mg/dL (ref 0.0–0.2)
GLOBULIN: 2.6 g/dL (ref 1.9–3.7)
TOTAL PROTEIN: 6.8 g/dL (ref 6.1–8.1)
Total Bilirubin: 0.5 mg/dL (ref 0.2–1.2)

## 2017-02-11 LAB — IRON, TOTAL/TOTAL IRON BINDING CAP
%SAT: 24 % (ref 11–50)
Iron: 91 ug/dL (ref 45–160)
TIBC: 374 ug/dL (ref 250–450)

## 2017-02-11 LAB — FERRITIN: Ferritin: 148 ng/mL (ref 20–288)

## 2017-02-17 ENCOUNTER — Ambulatory Visit (HOSPITAL_COMMUNITY)
Admission: RE | Admit: 2017-02-17 | Discharge: 2017-02-17 | Disposition: A | Discharge status: Home / Self Care | Payer: Medicare Other | Source: Ambulatory Visit | Attending: Internal Medicine | Admitting: Internal Medicine

## 2017-02-17 DIAGNOSIS — N281 Cyst of kidney, acquired: Secondary | ICD-10-CM | POA: Diagnosis not present

## 2017-02-17 DIAGNOSIS — K760 Fatty (change of) liver, not elsewhere classified: Secondary | ICD-10-CM | POA: Diagnosis not present

## 2017-02-17 DIAGNOSIS — R932 Abnormal findings on diagnostic imaging of liver and biliary tract: Secondary | ICD-10-CM | POA: Diagnosis not present

## 2017-02-17 DIAGNOSIS — Z9049 Acquired absence of other specified parts of digestive tract: Secondary | ICD-10-CM | POA: Diagnosis not present

## 2017-02-17 DIAGNOSIS — D696 Thrombocytopenia, unspecified: Secondary | ICD-10-CM | POA: Diagnosis not present

## 2017-02-21 DIAGNOSIS — D509 Iron deficiency anemia, unspecified: Secondary | ICD-10-CM | POA: Diagnosis not present

## 2017-02-21 DIAGNOSIS — E538 Deficiency of other specified B group vitamins: Secondary | ICD-10-CM | POA: Diagnosis not present

## 2017-02-21 DIAGNOSIS — D696 Thrombocytopenia, unspecified: Secondary | ICD-10-CM | POA: Diagnosis not present

## 2017-02-23 DIAGNOSIS — Z6826 Body mass index (BMI) 26.0-26.9, adult: Secondary | ICD-10-CM | POA: Diagnosis not present

## 2017-02-23 DIAGNOSIS — E538 Deficiency of other specified B group vitamins: Secondary | ICD-10-CM | POA: Diagnosis not present

## 2017-02-23 DIAGNOSIS — D509 Iron deficiency anemia, unspecified: Secondary | ICD-10-CM | POA: Diagnosis not present

## 2017-02-24 ENCOUNTER — Telehealth (INDEPENDENT_AMBULATORY_CARE_PROVIDER_SITE_OTHER): Payer: Self-pay | Admitting: Internal Medicine

## 2017-02-24 ENCOUNTER — Other Ambulatory Visit (INDEPENDENT_AMBULATORY_CARE_PROVIDER_SITE_OTHER): Payer: Self-pay | Admitting: Internal Medicine

## 2017-02-24 DIAGNOSIS — D508 Other iron deficiency anemias: Secondary | ICD-10-CM

## 2017-02-24 NOTE — Telephone Encounter (Signed)
Jeanette Chang, EGD. I have spoken with patient.

## 2017-02-24 NOTE — Telephone Encounter (Signed)
error 

## 2017-02-25 ENCOUNTER — Encounter (INDEPENDENT_AMBULATORY_CARE_PROVIDER_SITE_OTHER): Payer: Self-pay | Admitting: *Deleted

## 2017-02-25 DIAGNOSIS — D649 Anemia, unspecified: Secondary | ICD-10-CM | POA: Insufficient documentation

## 2017-02-25 NOTE — Telephone Encounter (Signed)
EGD sch'd 04/07/17 at 1255 (1150 ), patient aware, instructions mailed

## 2017-03-02 ENCOUNTER — Other Ambulatory Visit: Payer: Self-pay

## 2017-03-02 ENCOUNTER — Encounter (HOSPITAL_COMMUNITY): Payer: Self-pay | Admitting: *Deleted

## 2017-03-02 ENCOUNTER — Encounter (HOSPITAL_COMMUNITY)
Admission: RE | Disposition: A | Discharge status: Home / Self Care | Payer: Self-pay | Source: Ambulatory Visit | Attending: Internal Medicine

## 2017-03-02 ENCOUNTER — Ambulatory Visit (HOSPITAL_COMMUNITY)
Admission: RE | Admit: 2017-03-02 | Discharge: 2017-03-02 | Disposition: A | Discharge status: Home / Self Care | Payer: Medicare Other | Source: Ambulatory Visit | Attending: Internal Medicine | Admitting: Internal Medicine

## 2017-03-02 DIAGNOSIS — K760 Fatty (change of) liver, not elsewhere classified: Secondary | ICD-10-CM | POA: Diagnosis not present

## 2017-03-02 DIAGNOSIS — I10 Essential (primary) hypertension: Secondary | ICD-10-CM | POA: Diagnosis not present

## 2017-03-02 DIAGNOSIS — Z79899 Other long term (current) drug therapy: Secondary | ICD-10-CM | POA: Insufficient documentation

## 2017-03-02 DIAGNOSIS — E119 Type 2 diabetes mellitus without complications: Secondary | ICD-10-CM | POA: Diagnosis not present

## 2017-03-02 DIAGNOSIS — Z7982 Long term (current) use of aspirin: Secondary | ICD-10-CM | POA: Diagnosis not present

## 2017-03-02 DIAGNOSIS — I8510 Secondary esophageal varices without bleeding: Secondary | ICD-10-CM | POA: Diagnosis not present

## 2017-03-02 DIAGNOSIS — D649 Anemia, unspecified: Secondary | ICD-10-CM | POA: Insufficient documentation

## 2017-03-02 DIAGNOSIS — K7460 Unspecified cirrhosis of liver: Secondary | ICD-10-CM | POA: Diagnosis not present

## 2017-03-02 DIAGNOSIS — M1990 Unspecified osteoarthritis, unspecified site: Secondary | ICD-10-CM | POA: Diagnosis not present

## 2017-03-02 DIAGNOSIS — K766 Portal hypertension: Secondary | ICD-10-CM | POA: Diagnosis not present

## 2017-03-02 DIAGNOSIS — I8500 Esophageal varices without bleeding: Secondary | ICD-10-CM | POA: Diagnosis not present

## 2017-03-02 DIAGNOSIS — Z7984 Long term (current) use of oral hypoglycemic drugs: Secondary | ICD-10-CM | POA: Diagnosis not present

## 2017-03-02 DIAGNOSIS — Z87891 Personal history of nicotine dependence: Secondary | ICD-10-CM | POA: Insufficient documentation

## 2017-03-02 DIAGNOSIS — E7800 Pure hypercholesterolemia, unspecified: Secondary | ICD-10-CM | POA: Diagnosis not present

## 2017-03-02 DIAGNOSIS — D509 Iron deficiency anemia, unspecified: Secondary | ICD-10-CM | POA: Diagnosis not present

## 2017-03-02 DIAGNOSIS — Z880 Allergy status to penicillin: Secondary | ICD-10-CM | POA: Insufficient documentation

## 2017-03-02 DIAGNOSIS — K317 Polyp of stomach and duodenum: Secondary | ICD-10-CM | POA: Insufficient documentation

## 2017-03-02 DIAGNOSIS — K2950 Unspecified chronic gastritis without bleeding: Secondary | ICD-10-CM | POA: Diagnosis not present

## 2017-03-02 DIAGNOSIS — Z888 Allergy status to other drugs, medicaments and biological substances status: Secondary | ICD-10-CM | POA: Diagnosis not present

## 2017-03-02 DIAGNOSIS — D508 Other iron deficiency anemias: Secondary | ICD-10-CM

## 2017-03-02 DIAGNOSIS — K3189 Other diseases of stomach and duodenum: Secondary | ICD-10-CM | POA: Insufficient documentation

## 2017-03-02 DIAGNOSIS — K7581 Nonalcoholic steatohepatitis (NASH): Secondary | ICD-10-CM

## 2017-03-02 DIAGNOSIS — Z86010 Personal history of colonic polyps: Secondary | ICD-10-CM | POA: Insufficient documentation

## 2017-03-02 DIAGNOSIS — Z885 Allergy status to narcotic agent status: Secondary | ICD-10-CM | POA: Insufficient documentation

## 2017-03-02 HISTORY — PX: BIOPSY: SHX5522

## 2017-03-02 HISTORY — PX: POLYPECTOMY: SHX5525

## 2017-03-02 HISTORY — PX: ESOPHAGOGASTRODUODENOSCOPY: SHX5428

## 2017-03-02 LAB — GLUCOSE, CAPILLARY: GLUCOSE-CAPILLARY: 99 mg/dL (ref 65–99)

## 2017-03-02 SURGERY — EGD (ESOPHAGOGASTRODUODENOSCOPY)
Anesthesia: Moderate Sedation

## 2017-03-02 MED ORDER — MEPERIDINE HCL 50 MG/ML IJ SOLN
INTRAMUSCULAR | Status: AC
  Filled 2017-03-02: qty 1

## 2017-03-02 MED ORDER — MEPERIDINE HCL 50 MG/ML IJ SOLN
INTRAMUSCULAR | Status: DC | PRN
Start: 2017-03-02 — End: 2017-03-02
  Administered 2017-03-02 (×2): 25 mg via INTRAVENOUS

## 2017-03-02 MED ORDER — SODIUM CHLORIDE 0.9 % IV SOLN
INTRAVENOUS | Status: DC
  Administered 2017-03-02: 1000 mL via INTRAVENOUS

## 2017-03-02 MED ORDER — MIDAZOLAM HCL 5 MG/5ML IJ SOLN
INTRAMUSCULAR | Status: DC | PRN
  Administered 2017-03-02: 2 mg via INTRAVENOUS
  Administered 2017-03-02: 1 mg via INTRAVENOUS
  Administered 2017-03-02: 2 mg via INTRAVENOUS

## 2017-03-02 MED ORDER — MIDAZOLAM HCL 5 MG/5ML IJ SOLN
INTRAMUSCULAR | Status: AC
  Filled 2017-03-02: qty 10

## 2017-03-02 MED ORDER — STERILE WATER FOR IRRIGATION IR SOLN
Status: DC | PRN
  Administered 2017-03-02: 12:00:00

## 2017-03-02 MED ORDER — LIDOCAINE VISCOUS 2 % MT SOLN
OROMUCOSAL | Status: AC
  Filled 2017-03-02: qty 15

## 2017-03-02 MED ORDER — LIDOCAINE VISCOUS 2 % MT SOLN
OROMUCOSAL | Status: DC | PRN
  Administered 2017-03-02: 4 mL via OROMUCOSAL

## 2017-03-02 NOTE — Op Note (Signed)
[facility] Patient Name: Jeanette Chang Procedure Date: 03/02/2017 11:20 AM MRN: [id number] Date of Birth: 1940-02-16 Attending MD: Hildred Laser , MD CSN: [id number] Age: 77 Admit Type: Outpatient Procedure:                Upper GI endoscopy Indications:              Iron deficiency anemia Providers:                Hildred Laser, MD, Otis Peak B. Sharon Seller, RN, Nelma Rothman, Technician, Randa Spike, Technician Referring MD:             Henreitta Leber, MD and Monico Blitz, MD Medicines:                Lidocaine spray, Meperidine 50 mg IV, Midazolam 6                            mg IV Complications:            No immediate complications. Estimated Blood Loss:     Estimated blood loss: none. Estimated blood loss                            was minimal. Procedure:                Pre-Anesthesia Assessment:                           - Prior to the procedure, a History and Physical                            was performed, and patient medications and                            allergies were reviewed. The patient's tolerance of                            previous anesthesia was also reviewed. The risks                            and benefits of the procedure and the sedation                            options and risks were discussed with the patient.                            All questions were answered, and informed consent                            was obtained. Prior Anticoagulants: The patient                            last took aspirin 3 days prior to the procedure.  ASA Grade Assessment: II - A patient with mild                            systemic disease. After reviewing the risks and                            benefits, the patient was deemed in satisfactory                            condition to undergo the procedure.                           After obtaining informed consent, the endoscope was   passed under direct vision. Throughout the                            procedure, the patient's blood pressure, pulse, and                            oxygen saturations were monitored continuously. The                            [id number] ([id number]) scope was introduced through the                            mouth, and advanced to the second part of duodenum.                            The upper GI endoscopy was accomplished without                            difficulty. The patient tolerated the procedure                            well. Scope In: 12:02:35 PM Scope Out: 12:22:00 PM Total Procedure Duration: 0 hours 19 minutes 25 seconds  Findings:      The proximal esophagus and mid esophagus were normal.      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      The Z-line was regular and was found 38 cm from the incisors.      Mild portal hypertensive gastropathy was found in the entire examined       stomach.      Three small semi-sessile polyps with no bleeding and no stigmata of       recent bleeding were found in the gastric fundus and in the gastric       body. The polyp was removed with a hot snare. Resection and retrieval       were complete. The pathology specimen was placed into Bottle Number 2.      A single 10 mm semi-sessile polyp with no bleeding and no stigmata of       recent bleeding was found in the prepyloric region of the stomach. The       polyp was removed with a hot snare. Resection and retrieval were       complete. The pathology specimen was  placed into Bottle Number 1.      Patchy mucosal changes characterized by {skip}Raised white plaques. were       found in the gastric antrum and in the prepyloric region of the stomach.       Biopsies were taken with a cold forceps for histology. The pathology       specimen was placed into Bottle Number 3.      The duodenal bulb and second portion of the duodenum were normal. Impression:               - Normal proximal  esophagus and mid esophagus.                           - Grade I esophageal varix. Single column.                           - Z-line regular, 38 cm from the incisors.                           - Portal hypertensive gastropathy.                           - Three gastric polyps. Resected and retrieved.                            Polyp at gastric body with ulceration.                           - A single gastric polyp. Resected and retrieved.                           - {skip}Raised white plaques. mucosa in the antrum                            and prepyloric region of the stomach. Biopsied.                           - Normal duodenal bulb and second portion of the                            duodenum.                           Comment:She may be losing blood from UGI tract.                           Antral mucosal changes appear to be due to                            intestinal metaplasia. Moderate Sedation:      Moderate (conscious) sedation was administered by the endoscopy nurse       and supervised by the endoscopist. The following parameters were       monitored: oxygen saturation, heart rate, blood pressure, CO2       capnography and response to care. Total physician intraservice time was       71  minutes. Recommendation:           - Patient has a contact number available for                            emergencies. The signs and symptoms of potential                            delayed complications were discussed with the                            patient. Return to normal activities tomorrow.                            Written discharge instructions were provided to the                            patient.                           - Resume previous diet today.                           - Continue present medications.                           - No aspirin, ibuprofen, naproxen, or other                            non-steroidal anti-inflammatory drugs for 7 days                             after polyp removal.                           - Await pathology results.                           - Repeat upper endoscopy in 1 year.                           - AFP.                           - Return to GI office in 6 months. Procedure Code(s):        --- Professional ---                           [phone], Esophagogastroduodenoscopy, flexible,                            transoral; with removal of tumor(s), polyp(s), or                            other lesion(s) by snare technique                           99152, Moderate  sedation services provided by the                            same physician or other qualified health care                            professional performing the diagnostic or                            therapeutic service that the sedation supports,                            requiring the presence of an independent trained                            observer to assist in the monitoring of the                            patient's level of consciousness and physiological                            status; initial 15 minutes of intraservice time,                            patient age 26 years or older                           [phone], Moderate sedation services; each additional                            15 minutes intraservice time Diagnosis Code(s):        --- Professional ---                           I85.00, Esophageal varices without bleeding                           K76.6, Portal hypertension                           K31.89, Other diseases of stomach and duodenum                           K31.7, Polyp of stomach and duodenum                           D50.9, Iron deficiency anemia, unspecified CPT copyright 2016 American Medical Association. All rights reserved. The codes documented in this report are preliminary and upon coder review may  be revised to meet current compliance requirements. Hildred Laser, MD Hildred Laser, MD 03/02/2017 12:43:16 PM This  report has been signed electronically. Number of Addenda: 0

## 2017-03-02 NOTE — Discharge Instructions (Signed)
Moderate Conscious Sedation, Adult, Care After These instructions provide you with information about caring for yourself after your procedure. Your health care provider may also give you more specific instructions. Your treatment has been planned according to current medical practices, but problems sometimes occur. Call your health care provider if you have any problems or questions after your procedure. What can I expect after the procedure? After your procedure, it is common:  To feel sleepy for several hours.  To feel clumsy and have poor balance for several hours.  To have poor judgment for several hours.  To vomit if you eat too soon.  Follow these instructions at home: For at least 24 hours after the procedure:   Do not: ? Participate in activities where you could fall or become injured. ? Drive. ? Use heavy machinery. ? Drink alcohol. ? Take sleeping pills or medicines that cause drowsiness. ? Make important decisions or sign legal documents. ? Take care of children on your own.  Rest. Eating and drinking  Follow the diet recommended by your health care provider.  If you vomit: ? Drink water, juice, or soup when you can drink without vomiting. ? Make sure you have little or no nausea before eating solid foods. General instructions  Have a responsible adult stay with you until you are awake and alert.  Take over-the-counter and prescription medicines only as told by your health care provider.  If you smoke, do not smoke without supervision.  Keep all follow-up visits as told by your health care provider. This is important. Contact a health care provider if:  You keep feeling nauseous or you keep vomiting.  You feel light-headed.  You develop a rash.  You have a fever. Get help right away if:  You have trouble breathing. This information is not intended to replace advice given to you by your health care provider. Make sure you discuss any questions you have  with your health care provider. Document Released: 11/22/2012 Document Revised: 07/07/2015 Document Reviewed: 05/24/2015 Elsevier Interactive Patient Education  2018 [employer]. Esophagogastroduodenoscopy, Care After Refer to this sheet in the next few weeks. These instructions provide you with information about caring for yourself after your procedure. Your health care provider may also give you more specific instructions. Your treatment has been planned according to current medical practices, but problems sometimes occur. Call your health care provider if you have any problems or questions after your procedure. What can I expect after the procedure? After the procedure, it is common to have:  A sore throat.  Nausea.  Bloating.  Dizziness.  Fatigue.  Follow these instructions at home:  Do not eat or drink anything until the numbing medicine (local anesthetic) has worn off and your gag reflex has returned. You will know that the local anesthetic has worn off when you can swallow comfortably.  Do not drive for 24 hours if you received a medicine to help you relax (sedative).  If your health care provider took a tissue sample for testing during the procedure, make sure to get your test results. This is your responsibility. Ask your health care provider or the department performing the test when your results will be ready.  Keep all follow-up visits as told by your health care provider. This is important. Contact a health care provider if:  You cannot stop coughing.  You are not urinating.  You are urinating less than usual. Get help right away if:  You have trouble swallowing.  You cannot eat  or drink.  You have throat or chest pain that gets worse.  You are dizzy or light-headed.  You faint.  You have nausea or vomiting.  You have chills.  You have a fever.  You have severe abdominal pain.  You have black, tarry, or bloody stools. This information is not  intended to replace advice given to you by your health care provider. Make sure you discuss any questions you have with your health care provider. Document Released: 01/19/2012 Document Revised: 07/10/2015 Document Reviewed: 12/26/2014 Elsevier Interactive Patient Education  2018 [employer]. No aspirin or NSAIDs for 1 week. Resume other medications as before. Resume usual diet. No driving for 24 hours. Physician will call with results of blood test and biopsy.

## 2017-03-02 NOTE — H&P (Signed)
Jeanette Chang is an 77 y.o. female.   Chief Complaint: Patient is here for EGD. HPI: Female who has a history of iron deficiency anemia and was last evaluate 8 years ago.  She was found to have iron deficiency anemia again.  She has responded to iron infusion.  She denies melena or rectal bleeding hematemesis.  Her stool was guaiac negative.  She also denies vaginal bleeding or hematuria. She history of fatty liver.  Abdominopelvic CT from October 2018 revealed nodularity to liver and splenomegaly.  She was felt to have cirrhosis based on liver contour. He is on low-dose aspirin but does not take other OTC NSAIDs.  She denies heartburn or dysphagia.  She also denies abdominal pain. He has a history of colonic polyps.  She had colonoscopy in February 2016 by Dr. Anthony Sar and was normal. History is negative for CRC.  Past Medical History:  Diagnosis Date  . Cirrhosis secondary to NAFLD   .    Marland Kitchen    .    . Anemia, IDA   . Candidiasis of skin and nails   . Dermatophytosis of foot   . Dermatophytosis of the body   . Diverticulitis of colon (without mention of hemorrhage)(562.11)   . Edema   . Headache(784.0)   . Malaise and fatigue   . 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 Osteoarthrosis, unspecified whether generalized or localized, unspecified site   . Other dyspnea and respiratory abnormality   . Other psoriasis   . Other specified disorders of rotator cuff syndrome of shoulder and allied disorders   . Pain in joint   . Pure hypercholesterolemia   . RUQ pain   . Spasm of back muscles   . Thrombocytopenia, unspecified ([place])   . Type II or unspecified type diabetes mellitus without mention of complication, not stated as uncontrolled   . Unspecified essential hypertension   . Unspecified sinusitis (chronic)   . Urinary incontinence     Past Surgical History:  Procedure Laterality Date  . APPENDECTOMY  1964  . CAROTID ENDARTERECTOMY   2005   Left CEA  . CAROTID ENDARTERECTOMY  2009   Right CEA  . CHOLECYSTECTOMY  1982   Gall Bladder  . COLONOSCOPY  09  . NOSE SURGERY  1978  . PARATHYROIDECTOMY  1992  . Power port  03/06/2009   Right upper chest   . TONSILLECTOMY    . TUMOR EXCISION Right August 24, 2013   [facility] Dr. Ellen Henri, ENT  . VAGINAL HYSTERECTOMY  1972    Family History  Problem Relation Age of Onset  . Heart attack Mother   . Cancer Mother   . Heart attack Father   . Cancer Sister    Social History:  reports that she has quit smoking. Her smoking use included cigarettes. She quit smokeless tobacco use about 24 years ago. She reports that she drinks about 0.6 oz of alcohol per week. She reports that she does not use drugs.  Allergies:  Allergies  Allergen Reactions  . Codeine Nausea And Vomiting and Rash  . Monascus Purpureus Went Yeast Other (See Comments)    Headaches, myalgias  . Niacin And Related Other (See Comments)    Headaches, myalgias  . Red Yeast Rice Other (See Comments)    Headaches, myalgias  . Actos [Pioglitazone Hydrochloride] Other (See Comments)    Severe headache  . Amaryl Nausea Only and Other (See Comments)  Severe headache  . Sanctura [Trospium Chloride] Nausea Only and Other (See Comments)    Severe headache  . Statins Other (See Comments)    Severe headache,  Hips and leg weakness that cause patient not to be able to function as per patient.  Lisbeth Ply [Fesoterodine Fumarate] Other (See Comments)    Cramps, severe headache  . Penicillins Swelling, Rash and Other (See Comments)    Has patient had a PCN reaction causing immediate rash, facial/tongue/throat swelling, SOB or lightheadedness with hypotension: No Has patient had a PCN reaction causing severe rash involving mucus membranes or skin necrosis: No Has patient had a PCN reaction that required hospitalization: Yes Has patient had a PCN reaction occurring within the last 10 years: No If  all of the above answers are "NO", then may proceed with Cephalosporin use.   Marland Kitchen Ultram [Tramadol Hcl] Nausea And Vomiting    Medications Prior to Admission  Medication Sig Dispense Refill  . acetaminophen (TYLENOL) 500 MG tablet Take 1,000 mg by mouth every 6 (six) hours as needed for moderate pain or headache.     Marland Kitchen aspirin 81 MG tablet Take 81 mg by mouth at bedtime.     Marland Kitchen atenolol (TENORMIN) 50 MG tablet Take 50 mg by mouth daily.      Marland Kitchen augmented betamethasone dipropionate (DIPROLENE-AF) 0.05 % ointment Apply 1 application topically 2 (two) times daily as needed (for psoriasis).   0  . canagliflozin (INVOKANA) 300 MG TABS tablet Take 300 mg by mouth daily before breakfast.    . cyanocobalamin (,VITAMIN B-12,) 1000 MCG/ML injection Inject 1,000 mcg into the muscle every 30 (thirty) days.    . folic acid (FOLVITE) 1 MG tablet Take 1 mg by mouth 2 (two) times daily.     Marland Kitchen glimepiride (AMARYL) 4 MG tablet Take 4 mg by mouth daily.    Marland Kitchen HYDROcodone-acetaminophen (NORCO) 7.5-325 MG tablet Take 1 tablet by mouth daily as needed for severe pain.     Marland Kitchen lisinopril (PRINIVIL,ZESTRIL) 40 MG tablet Take 40 mg by mouth daily.    . metFORMIN (GLUCOPHAGE) 500 MG tablet Take 1,000 mg by mouth 2 (two) times daily with a meal.     . ZINC OXIDE EX Apply 1 application topically every other day.      Results for orders placed or performed during the hospital encounter of 03/02/17 (from the past 48 hour(s))  Glucose, capillary     Status: None   Collection Time: 03/02/17 10:23 AM  Result Value Ref Range   Glucose-Capillary 99 65 - 99 mg/dL   No results found.  ROS  Blood pressure (!) 168/78, pulse 66, temperature 97.6 F (36.4 C), temperature source Oral, resp. rate 16, height 5' 1"  (1.549 m), weight 140 lb 3.2 oz (63.6 kg), SpO2 99 %. Physical Exam  Constitutional: She appears well-developed and well-nourished.  HENT:  Mouth/Throat: Oropharynx is clear and moist.  Eyes: Conjunctivae are normal. No  scleral icterus.  Neck: No thyromegaly present.  Cardiovascular: Normal rate, regular rhythm and normal heart sounds.  No murmur heard. Respiratory: Effort normal and breath sounds normal.  GI:  Abdomen is full.  Soft.  Liver edge easily palpable and firm.  Spleen is not palpable.  Musculoskeletal: She exhibits no edema.  Lymphadenopathy:    She has no cervical adenopathy.  Neurological: She is alert.  Skin: Skin is warm and dry.     Assessment/Plan History of iron deficiency anemia. Cirrhosis secondary to NAFLD. Diagnostic EGD.  [employer]  Laural Golden, MD 03/02/2017, 11:49 AM

## 2017-03-08 ENCOUNTER — Encounter (HOSPITAL_COMMUNITY): Payer: Self-pay | Admitting: Internal Medicine

## 2017-03-18 ENCOUNTER — Other Ambulatory Visit (INDEPENDENT_AMBULATORY_CARE_PROVIDER_SITE_OTHER): Payer: Self-pay | Admitting: *Deleted

## 2017-03-18 DIAGNOSIS — K769 Liver disease, unspecified: Secondary | ICD-10-CM

## 2017-03-18 DIAGNOSIS — D508 Other iron deficiency anemias: Secondary | ICD-10-CM

## 2017-03-18 DIAGNOSIS — K7581 Nonalcoholic steatohepatitis (NASH): Secondary | ICD-10-CM

## 2017-03-18 DIAGNOSIS — D696 Thrombocytopenia, unspecified: Secondary | ICD-10-CM

## 2017-03-18 DIAGNOSIS — K7460 Unspecified cirrhosis of liver: Secondary | ICD-10-CM

## 2017-03-18 LAB — AFP TUMOR MARKER

## 2017-03-21 ENCOUNTER — Other Ambulatory Visit (HOSPITAL_COMMUNITY)
Admission: RE | Admit: 2017-03-21 | Discharge: 2017-03-21 | Disposition: A | Discharge status: Home / Self Care | Payer: Medicare Other | Source: Ambulatory Visit | Attending: Internal Medicine | Admitting: Internal Medicine

## 2017-03-21 DIAGNOSIS — D508 Other iron deficiency anemias: Secondary | ICD-10-CM | POA: Diagnosis not present

## 2017-03-21 NOTE — Progress Notes (Signed)
Patient is scheduled to return on 09/27/17 at 9:30. Patient is aware.

## 2017-03-22 DIAGNOSIS — R928 Other abnormal and inconclusive findings on diagnostic imaging of breast: Secondary | ICD-10-CM | POA: Diagnosis not present

## 2017-03-22 DIAGNOSIS — Z1231 Encounter for screening mammogram for malignant neoplasm of breast: Secondary | ICD-10-CM | POA: Diagnosis not present

## 2017-03-22 LAB — AFP TUMOR MARKER: AFP, SERUM, TUMOR MARKER: 2.6 ng/mL (ref 0.0–8.3)

## 2017-03-28 ENCOUNTER — Encounter (INDEPENDENT_AMBULATORY_CARE_PROVIDER_SITE_OTHER): Payer: Self-pay | Admitting: *Deleted

## 2017-04-01 DIAGNOSIS — D509 Iron deficiency anemia, unspecified: Secondary | ICD-10-CM | POA: Diagnosis not present

## 2017-04-01 DIAGNOSIS — K7460 Unspecified cirrhosis of liver: Secondary | ICD-10-CM | POA: Diagnosis not present

## 2017-04-01 DIAGNOSIS — D696 Thrombocytopenia, unspecified: Secondary | ICD-10-CM | POA: Diagnosis not present

## 2017-04-01 DIAGNOSIS — E538 Deficiency of other specified B group vitamins: Secondary | ICD-10-CM | POA: Diagnosis not present

## 2017-04-06 DIAGNOSIS — E538 Deficiency of other specified B group vitamins: Secondary | ICD-10-CM | POA: Diagnosis not present

## 2017-04-06 DIAGNOSIS — Z6826 Body mass index (BMI) 26.0-26.9, adult: Secondary | ICD-10-CM | POA: Diagnosis not present

## 2017-04-06 DIAGNOSIS — D500 Iron deficiency anemia secondary to blood loss (chronic): Secondary | ICD-10-CM | POA: Diagnosis not present

## 2017-04-06 DIAGNOSIS — R928 Other abnormal and inconclusive findings on diagnostic imaging of breast: Secondary | ICD-10-CM | POA: Diagnosis not present

## 2017-04-06 DIAGNOSIS — D732 Chronic congestive splenomegaly: Secondary | ICD-10-CM | POA: Diagnosis not present

## 2017-04-12 DIAGNOSIS — Z87891 Personal history of nicotine dependence: Secondary | ICD-10-CM | POA: Diagnosis not present

## 2017-04-12 DIAGNOSIS — D473 Essential (hemorrhagic) thrombocythemia: Secondary | ICD-10-CM | POA: Diagnosis not present

## 2017-04-12 DIAGNOSIS — Z6826 Body mass index (BMI) 26.0-26.9, adult: Secondary | ICD-10-CM | POA: Diagnosis not present

## 2017-04-12 DIAGNOSIS — I10 Essential (primary) hypertension: Secondary | ICD-10-CM | POA: Diagnosis not present

## 2017-04-12 DIAGNOSIS — I639 Cerebral infarction, unspecified: Secondary | ICD-10-CM | POA: Diagnosis not present

## 2017-04-12 DIAGNOSIS — D509 Iron deficiency anemia, unspecified: Secondary | ICD-10-CM | POA: Diagnosis not present

## 2017-04-12 DIAGNOSIS — E1142 Type 2 diabetes mellitus with diabetic polyneuropathy: Secondary | ICD-10-CM | POA: Diagnosis not present

## 2017-04-12 DIAGNOSIS — K7460 Unspecified cirrhosis of liver: Secondary | ICD-10-CM | POA: Diagnosis not present

## 2017-04-12 DIAGNOSIS — Z299 Encounter for prophylactic measures, unspecified: Secondary | ICD-10-CM | POA: Diagnosis not present

## 2017-04-12 DIAGNOSIS — E1165 Type 2 diabetes mellitus with hyperglycemia: Secondary | ICD-10-CM | POA: Diagnosis not present

## 2017-04-27 DIAGNOSIS — Z299 Encounter for prophylactic measures, unspecified: Secondary | ICD-10-CM | POA: Diagnosis not present

## 2017-04-27 DIAGNOSIS — E1165 Type 2 diabetes mellitus with hyperglycemia: Secondary | ICD-10-CM | POA: Diagnosis not present

## 2017-04-27 DIAGNOSIS — I10 Essential (primary) hypertension: Secondary | ICD-10-CM | POA: Diagnosis not present

## 2017-04-27 DIAGNOSIS — Z87891 Personal history of nicotine dependence: Secondary | ICD-10-CM | POA: Diagnosis not present

## 2017-04-27 DIAGNOSIS — E7800 Pure hypercholesterolemia, unspecified: Secondary | ICD-10-CM | POA: Diagnosis not present

## 2017-04-27 DIAGNOSIS — E1142 Type 2 diabetes mellitus with diabetic polyneuropathy: Secondary | ICD-10-CM | POA: Diagnosis not present

## 2017-04-27 DIAGNOSIS — Z6826 Body mass index (BMI) 26.0-26.9, adult: Secondary | ICD-10-CM | POA: Diagnosis not present

## 2017-04-27 DIAGNOSIS — D473 Essential (hemorrhagic) thrombocythemia: Secondary | ICD-10-CM | POA: Diagnosis not present

## 2017-04-27 DIAGNOSIS — Z6825 Body mass index (BMI) 25.0-25.9, adult: Secondary | ICD-10-CM | POA: Diagnosis not present

## 2017-04-27 DIAGNOSIS — J329 Chronic sinusitis, unspecified: Secondary | ICD-10-CM | POA: Diagnosis not present

## 2017-04-29 DIAGNOSIS — D500 Iron deficiency anemia secondary to blood loss (chronic): Secondary | ICD-10-CM | POA: Diagnosis not present

## 2017-05-04 DIAGNOSIS — D500 Iron deficiency anemia secondary to blood loss (chronic): Secondary | ICD-10-CM | POA: Diagnosis not present

## 2017-05-04 DIAGNOSIS — E538 Deficiency of other specified B group vitamins: Secondary | ICD-10-CM | POA: Diagnosis not present

## 2017-05-04 DIAGNOSIS — Z6826 Body mass index (BMI) 26.0-26.9, adult: Secondary | ICD-10-CM | POA: Diagnosis not present

## 2017-06-12 IMAGING — US ULTRASOUND ABDOMEN COMPLETE
1 series · 14 of 25 positions shown · non-contrast
Comparison: CT abdomen and pelvis [DATE]. Ultrasound of the
abdomen [DATE] and [DATE].

CLINICAL DATA: History of cirrhosis.

EXAM:
ABDOMEN ULTRASOUND COMPLETE

[Series 1: ultrasound abdomen complete · 0.14mm/px · 14 of 150 slices shown]
[im 1/150]
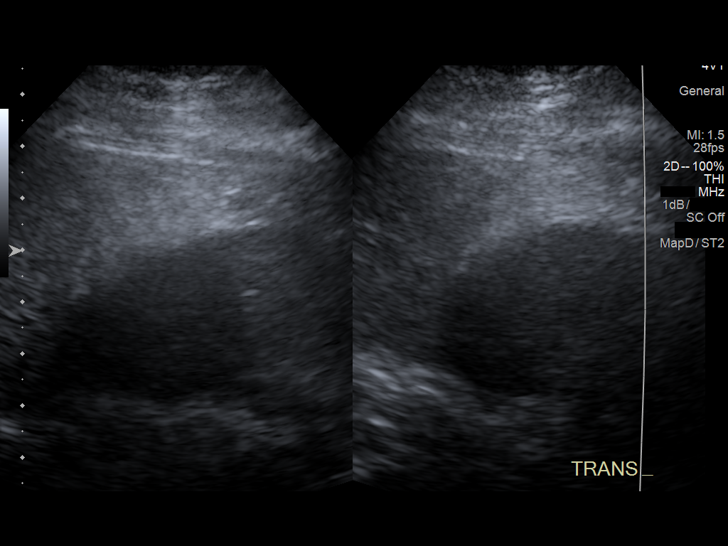
[im 13/150]
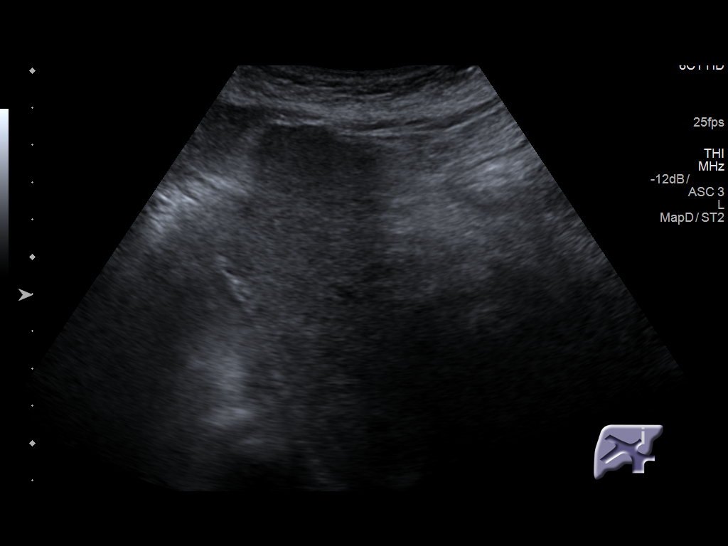
[im 25/150]
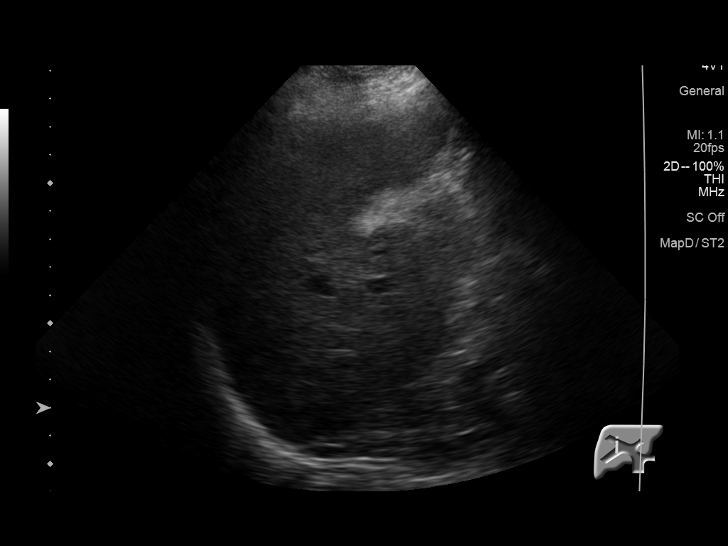
[im 38/150]
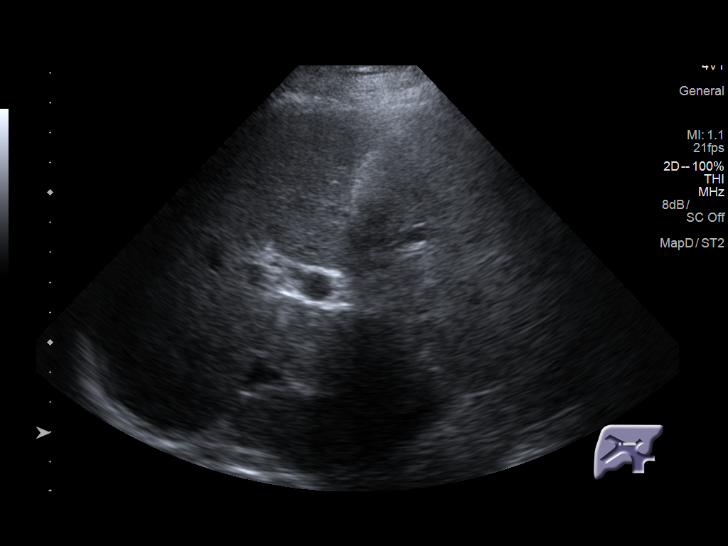
[im 50/150]
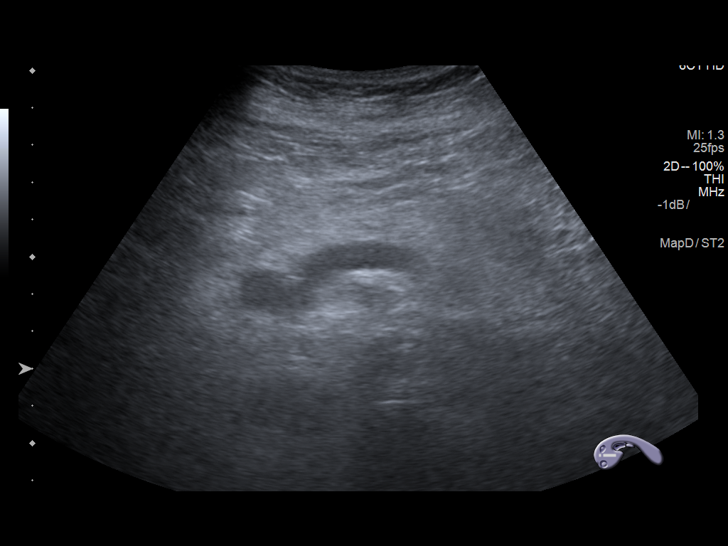
[im 56/150]
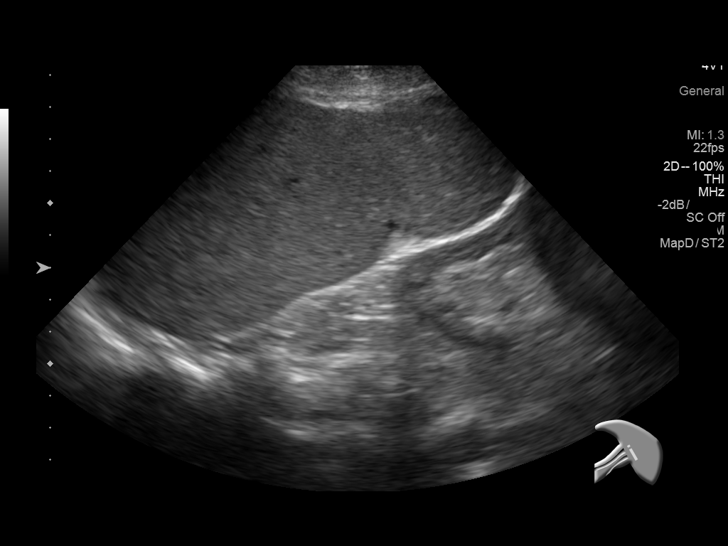
[im 69/150]
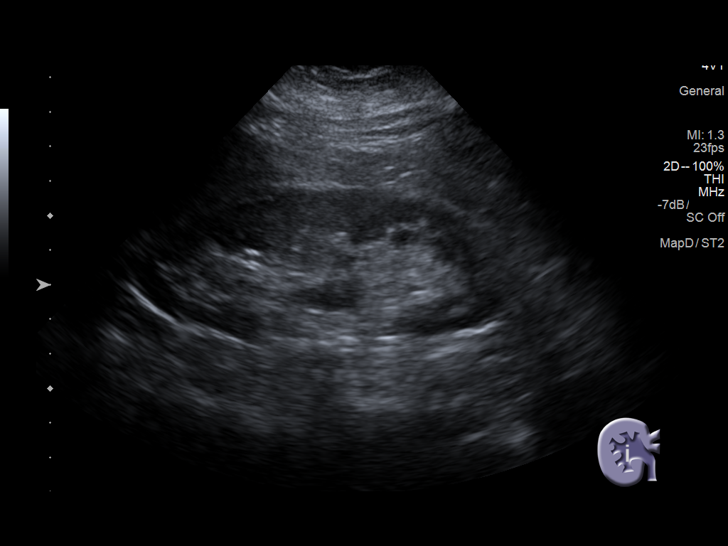
[im 81/150]
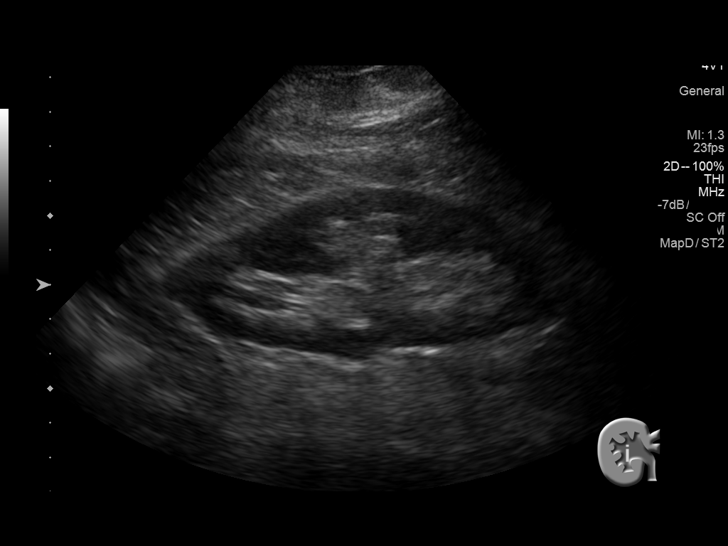
[im 94/150]
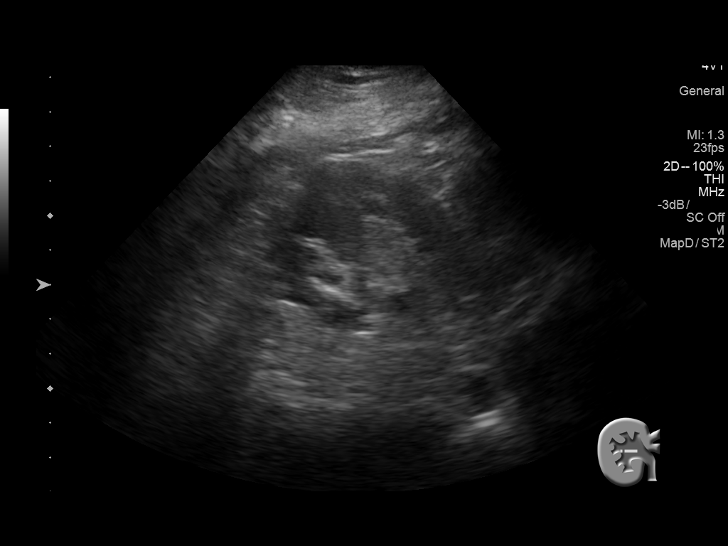
[im 100/150]
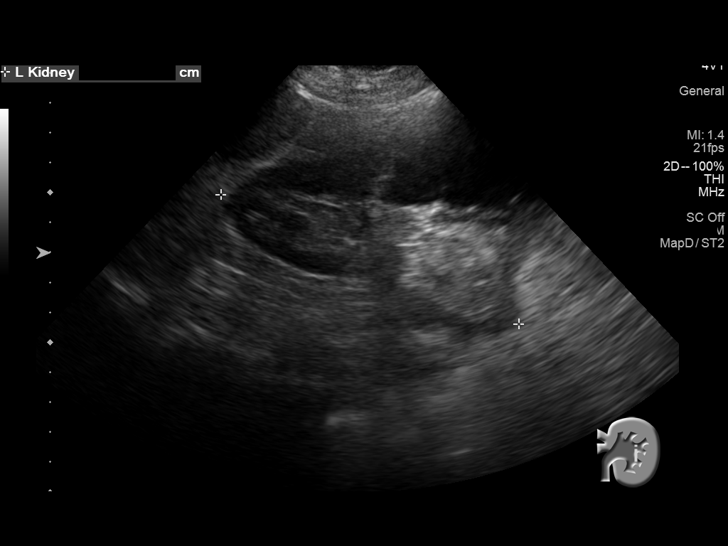
[im 112/150]
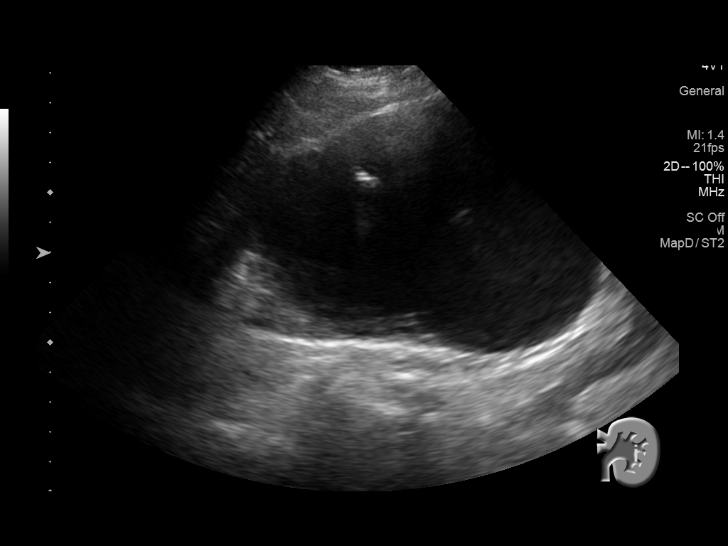
[im 125/150]
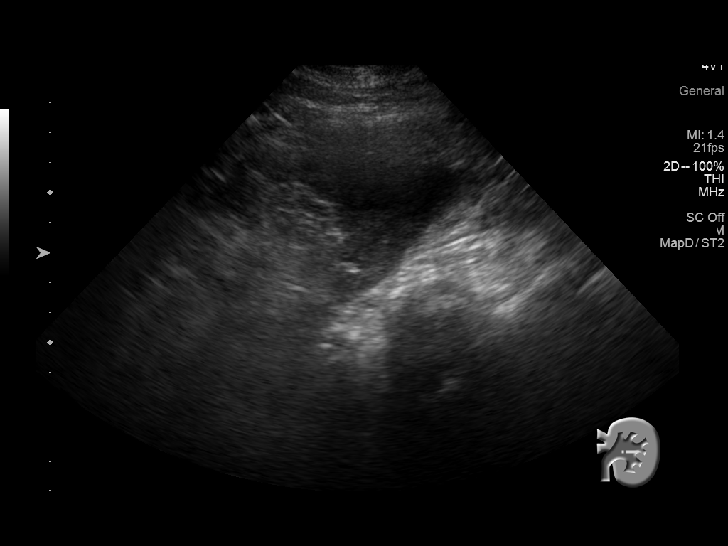
[im 137/150]
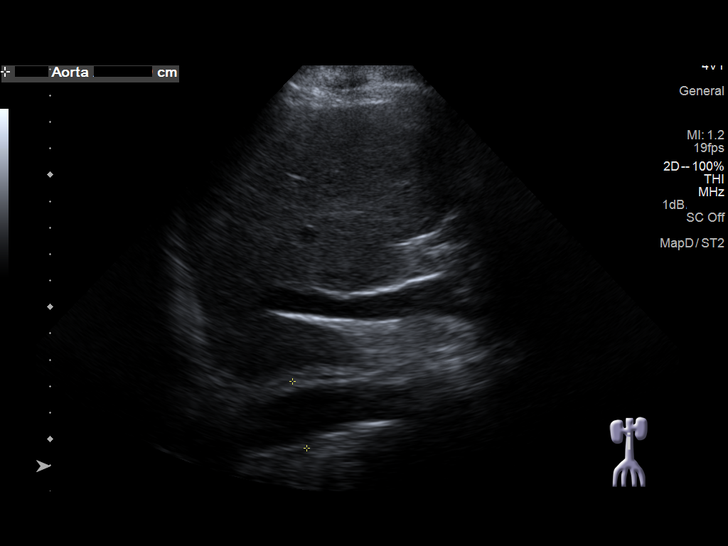
[im 150/150]
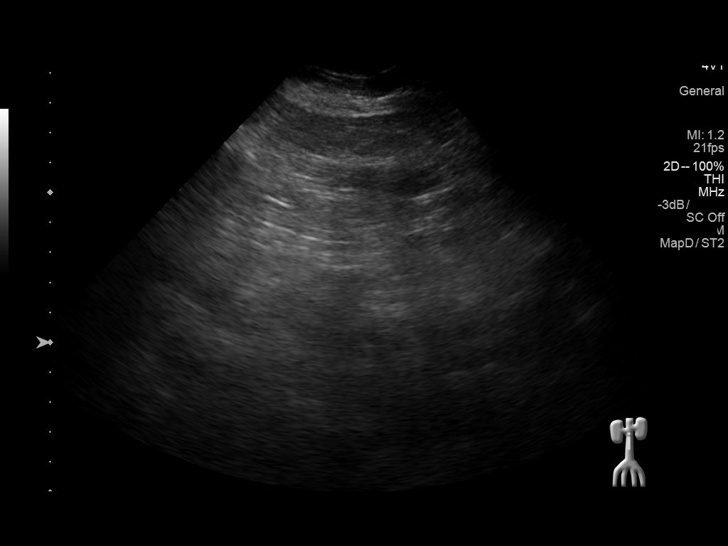

[14 of 25 positions shown; findings below may reference images not displayed]

FINDINGS: Gallbladder: Removed.

Common bile duct: Diameter: 0.8 cm.

Liver: No focal lesion. Nodular border of the liver consistent with
cirrhosis is noted. Portal vein is patent on color Doppler imaging
with normal direction of blood flow towards the liver.

IVC: No abnormality visualized.

Pancreas: Visualized portion unremarkable.

Spleen: Spleen volume is mildly increased at 488.4 cc. Normal is 411
cc. No focal lesion.

Right Kidney: Length: 11.4 cm. Echogenicity within normal limits. No
mass or hydronephrosis visualized.

Left Kidney: Length: 11.2 cm. Echogenicity within normal limits.
Large cystic lesion measuring 13 cm in diameter is unchanged. No
solid mass or hydronephrosis visualized.

Abdominal aorta: No aneurysm visualized.

Other findings: None.
IMPRESSION: Cirrhotic liver without focal lesion.  No ascites.

Mild splenomegaly is new since the most recent exam.

## 2017-06-13 DIAGNOSIS — E538 Deficiency of other specified B group vitamins: Secondary | ICD-10-CM | POA: Diagnosis not present

## 2017-06-13 DIAGNOSIS — D500 Iron deficiency anemia secondary to blood loss (chronic): Secondary | ICD-10-CM | POA: Diagnosis not present

## 2017-06-15 DIAGNOSIS — D500 Iron deficiency anemia secondary to blood loss (chronic): Secondary | ICD-10-CM | POA: Diagnosis not present

## 2017-06-15 DIAGNOSIS — E538 Deficiency of other specified B group vitamins: Secondary | ICD-10-CM | POA: Diagnosis not present

## 2017-06-15 DIAGNOSIS — D696 Thrombocytopenia, unspecified: Secondary | ICD-10-CM | POA: Diagnosis not present

## 2017-06-23 DIAGNOSIS — D509 Iron deficiency anemia, unspecified: Secondary | ICD-10-CM | POA: Diagnosis not present

## 2017-07-14 DIAGNOSIS — E1142 Type 2 diabetes mellitus with diabetic polyneuropathy: Secondary | ICD-10-CM | POA: Diagnosis not present

## 2017-07-14 DIAGNOSIS — D473 Essential (hemorrhagic) thrombocythemia: Secondary | ICD-10-CM | POA: Diagnosis not present

## 2017-07-14 DIAGNOSIS — K7460 Unspecified cirrhosis of liver: Secondary | ICD-10-CM | POA: Diagnosis not present

## 2017-07-14 DIAGNOSIS — Z6825 Body mass index (BMI) 25.0-25.9, adult: Secondary | ICD-10-CM | POA: Diagnosis not present

## 2017-07-14 DIAGNOSIS — E1165 Type 2 diabetes mellitus with hyperglycemia: Secondary | ICD-10-CM | POA: Diagnosis not present

## 2017-07-14 DIAGNOSIS — Z299 Encounter for prophylactic measures, unspecified: Secondary | ICD-10-CM | POA: Diagnosis not present

## 2017-07-14 DIAGNOSIS — I10 Essential (primary) hypertension: Secondary | ICD-10-CM | POA: Diagnosis not present

## 2017-07-25 DIAGNOSIS — D500 Iron deficiency anemia secondary to blood loss (chronic): Secondary | ICD-10-CM | POA: Diagnosis not present

## 2017-07-25 DIAGNOSIS — D696 Thrombocytopenia, unspecified: Secondary | ICD-10-CM | POA: Diagnosis not present

## 2017-07-25 DIAGNOSIS — E538 Deficiency of other specified B group vitamins: Secondary | ICD-10-CM | POA: Diagnosis not present

## 2017-07-27 DIAGNOSIS — Z636 Dependent relative needing care at home: Secondary | ICD-10-CM | POA: Insufficient documentation

## 2017-07-27 DIAGNOSIS — D500 Iron deficiency anemia secondary to blood loss (chronic): Secondary | ICD-10-CM | POA: Diagnosis not present

## 2017-07-27 DIAGNOSIS — Z6826 Body mass index (BMI) 26.0-26.9, adult: Secondary | ICD-10-CM | POA: Diagnosis not present

## 2017-07-27 DIAGNOSIS — K7460 Unspecified cirrhosis of liver: Secondary | ICD-10-CM | POA: Diagnosis not present

## 2017-07-27 DIAGNOSIS — E538 Deficiency of other specified B group vitamins: Secondary | ICD-10-CM | POA: Diagnosis not present

## 2017-09-01 DIAGNOSIS — D500 Iron deficiency anemia secondary to blood loss (chronic): Secondary | ICD-10-CM | POA: Diagnosis not present

## 2017-09-01 DIAGNOSIS — K7460 Unspecified cirrhosis of liver: Secondary | ICD-10-CM | POA: Diagnosis not present

## 2017-09-01 DIAGNOSIS — Z636 Dependent relative needing care at home: Secondary | ICD-10-CM | POA: Diagnosis not present

## 2017-09-01 DIAGNOSIS — E538 Deficiency of other specified B group vitamins: Secondary | ICD-10-CM | POA: Diagnosis not present

## 2017-09-27 ENCOUNTER — Encounter (INDEPENDENT_AMBULATORY_CARE_PROVIDER_SITE_OTHER): Payer: Self-pay | Admitting: Internal Medicine

## 2017-09-27 ENCOUNTER — Encounter (INDEPENDENT_AMBULATORY_CARE_PROVIDER_SITE_OTHER): Payer: Self-pay | Admitting: *Deleted

## 2017-09-27 ENCOUNTER — Ambulatory Visit (INDEPENDENT_AMBULATORY_CARE_PROVIDER_SITE_OTHER): Payer: Medicare Other | Admitting: Internal Medicine

## 2017-09-27 VITALS — BP 130/66 | HR 68 | Temp 98.1°F | Resp 18 | Ht 66.0 in | Wt 140.4 lb

## 2017-09-27 DIAGNOSIS — K7581 Nonalcoholic steatohepatitis (NASH): Secondary | ICD-10-CM

## 2017-09-27 DIAGNOSIS — K7460 Unspecified cirrhosis of liver: Secondary | ICD-10-CM | POA: Diagnosis not present

## 2017-09-27 DIAGNOSIS — D500 Iron deficiency anemia secondary to blood loss (chronic): Secondary | ICD-10-CM | POA: Diagnosis not present

## 2017-09-27 NOTE — Progress Notes (Signed)
Presenting complaint;  Follow-up for chronic liver disease.  Patient also has iron deficiency anemia.  Database and subjective:  Patient is 77 year old Caucasian female who has history of fatty liver dating back to late 1980s.  She says her physician had thought about doing a liver biopsy but she never had one.  She was diagnosed with cirrhosis by Dr. Jacquiline Doe few years ago.  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 She also has a history of iron deficiency anemia for at least 8 years.  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  She states she is doing well.  She had hemoglobin by Dr. Federico Flake of [facility] of about 3 weeks ago and it was normal. She is receiving iron infusion every 6 to 8 weeks.  She has fair appetite.  She has lost 4 pounds since December 2018.  Her bowels move daily.  She denies melena or rectal bleeding nausea vomiting heartburn or dysphagia. She was diagnosed with diabetes mellitus in 1986. She says her back pain has gotten much better since she had epidural in March 2018.  Since then she only has taken for pain pills.  She does not take OTC NSAIDs.  She tells me her younger sister was diagnosed with hemochromatosis she undergoes phlebotomy on a regular basis.  She had genetic testing by Dr. Jacquiline Doe few years ago and was negative.  Current Medications: Outpatient Encounter Medications as of 09/27/2017  Medication Sig  . acetaminophen (TYLENOL) 500 MG tablet Take 1,000 mg by mouth every 6 (six) hours as needed for moderate pain or headache.   Marland Kitchen aspirin 81 MG tablet Take 1 tablet (81 mg total) by mouth at bedtime.  Marland Kitchen atenolol (TENORMIN) 50 MG tablet Take 50 mg by mouth daily.    Marland Kitchen augmented betamethasone dipropionate (DIPROLENE-AF) 0.05 % ointment Apply 1 application topically 2 (two) times daily as needed (for  psoriasis).   . cyanocobalamin (,VITAMIN B-12,) 1000 MCG/ML injection Inject 1,000 mcg into the muscle every 30 (thirty) days.  . dapagliflozin propanediol (FARXIGA) 10 MG TABS tablet Take 10 mg by mouth daily.   . folic acid (FOLVITE) 1 MG tablet Take 1 mg by mouth 2 (two) times daily.   Marland Kitchen glimepiride (AMARYL) 4 MG tablet Take 4 mg by mouth daily.  Marland Kitchen HYDROcodone-acetaminophen (NORCO) 7.5-325 MG tablet Take 1 tablet by mouth daily as needed for severe pain.   Marland Kitchen lisinopril (PRINIVIL,ZESTRIL) 40 MG tablet Take 40 mg by mouth daily.  . metFORMIN (GLUCOPHAGE) 500 MG tablet Take 1,000 mg by mouth 2 (two) times daily with a meal.   . ZINC OXIDE EX Apply 1 application topically every other day.  . [DISCONTINUED] canagliflozin (INVOKANA) 300 MG TABS tablet Take 300 mg by mouth daily before breakfast.   No facility-administered encounter medications on file as of 09/27/2017.      Objective: Blood pressure 130/66, pulse 68, temperature 98.1 F (36.7 C), temperature source Oral, resp. rate 18, height 5' 6"  (1.676 m), weight 140 lb 6.4 oz (63.7 kg). Patient is alert and in no acute distress. She does not have asterixis. Conjunctiva is pink. Sclera is nonicteric Oropharyngeal mucosa is normal. No neck masses or thyromegaly noted. She has long scar on the right side from parotid gland surgery along with left carotid endarterectomy scar and collar scar from thyroid surgery. Cardiac exam with regular rhythm normal S1  and S2. No murmur or gallop noted. Lungs are clear to auscultation. Abdomen abdomen is symmetrical.  On palpation is soft.  Spleen is not palpable.  Liver edge is firm below the right costal margin.  Shifting dullness absent. No LE edema or clubbing noted.  Labs/studies Results: Lab data from 09/01/2017 WBC 3.6, H&H 12.5 and 37.2 and platelet count 57K.  Serum ferritin 50.  LFTs from 04/01/2017 Bilirubin 0.5, AP 68, AST 36, ALT 34, total protein 7.3 and albumin  4.21.  Assessment:  #1.  Cirrhosis secondary to NASH.  She has well preserved hepatic function.  She has received hepatitis A and B vaccination.  She had a EGD in January this year revealing single small varix and mild changes of portal hypertensive gastropathy.  She is due for [facility] screening. It is interesting to note that her sister has hemochromatosis and she was screened years ago and testing was negative.  #2.  History of iron deficiency anemia back to 2010.  Recent H&H normal.  She is receiving iron infusion under supervision by Dr. Federico Flake of [facility].  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  Last colonoscopy was in February 2016 and was negative for colonic polyps.  She has a history of colonic polyps.  She also have impaired iron absorption.  #3.  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   Plan:  Patient will have AFP with blood work in 1 month. We will schedule patient for abdominal ultrasound. Patient will call if she has melena or rectal bleeding. She will return for office visit in 6 months.

## 2017-09-27 NOTE — Patient Instructions (Signed)
Abdominal ultrasound to be scheduled. Physician will call with results of blood tests when completed.

## 2017-10-04 ENCOUNTER — Ambulatory Visit (HOSPITAL_COMMUNITY)
Admission: RE | Admit: 2017-10-04 | Discharge: 2017-10-04 | Disposition: A | Discharge status: Home / Self Care | Payer: Medicare Other | Source: Ambulatory Visit | Attending: Internal Medicine | Admitting: Internal Medicine

## 2017-10-04 DIAGNOSIS — N281 Cyst of kidney, acquired: Secondary | ICD-10-CM | POA: Diagnosis not present

## 2017-10-04 DIAGNOSIS — K7460 Unspecified cirrhosis of liver: Secondary | ICD-10-CM | POA: Diagnosis not present

## 2017-10-04 DIAGNOSIS — K7581 Nonalcoholic steatohepatitis (NASH): Secondary | ICD-10-CM | POA: Insufficient documentation

## 2017-10-07 ENCOUNTER — Other Ambulatory Visit (INDEPENDENT_AMBULATORY_CARE_PROVIDER_SITE_OTHER): Payer: Self-pay | Admitting: *Deleted

## 2017-10-07 ENCOUNTER — Encounter (INDEPENDENT_AMBULATORY_CARE_PROVIDER_SITE_OTHER): Payer: Self-pay | Admitting: *Deleted

## 2017-10-07 DIAGNOSIS — K7581 Nonalcoholic steatohepatitis (NASH): Principal | ICD-10-CM

## 2017-10-07 DIAGNOSIS — K7460 Unspecified cirrhosis of liver: Secondary | ICD-10-CM

## 2017-10-27 DIAGNOSIS — D500 Iron deficiency anemia secondary to blood loss (chronic): Secondary | ICD-10-CM | POA: Diagnosis not present

## 2017-10-27 DIAGNOSIS — R5383 Other fatigue: Secondary | ICD-10-CM | POA: Diagnosis not present

## 2017-10-27 DIAGNOSIS — E559 Vitamin D deficiency, unspecified: Secondary | ICD-10-CM | POA: Diagnosis not present

## 2017-10-27 DIAGNOSIS — Z7984 Long term (current) use of oral hypoglycemic drugs: Secondary | ICD-10-CM | POA: Diagnosis not present

## 2017-10-27 DIAGNOSIS — Z299 Encounter for prophylactic measures, unspecified: Secondary | ICD-10-CM | POA: Diagnosis not present

## 2017-10-27 DIAGNOSIS — Z1211 Encounter for screening for malignant neoplasm of colon: Secondary | ICD-10-CM | POA: Diagnosis not present

## 2017-10-27 DIAGNOSIS — D473 Essential (hemorrhagic) thrombocythemia: Secondary | ICD-10-CM | POA: Diagnosis not present

## 2017-10-27 DIAGNOSIS — E7800 Pure hypercholesterolemia, unspecified: Secondary | ICD-10-CM | POA: Diagnosis not present

## 2017-10-27 DIAGNOSIS — E1151 Type 2 diabetes mellitus with diabetic peripheral angiopathy without gangrene: Secondary | ICD-10-CM | POA: Diagnosis not present

## 2017-10-27 DIAGNOSIS — K7460 Unspecified cirrhosis of liver: Secondary | ICD-10-CM | POA: Diagnosis not present

## 2017-10-27 DIAGNOSIS — E1142 Type 2 diabetes mellitus with diabetic polyneuropathy: Secondary | ICD-10-CM | POA: Diagnosis not present

## 2017-10-27 DIAGNOSIS — Z1331 Encounter for screening for depression: Secondary | ICD-10-CM | POA: Diagnosis not present

## 2017-10-27 DIAGNOSIS — I6529 Occlusion and stenosis of unspecified carotid artery: Secondary | ICD-10-CM | POA: Diagnosis not present

## 2017-10-27 DIAGNOSIS — K7581 Nonalcoholic steatohepatitis (NASH): Secondary | ICD-10-CM | POA: Diagnosis not present

## 2017-10-27 DIAGNOSIS — Z0000 Encounter for general adult medical examination without abnormal findings: Secondary | ICD-10-CM | POA: Diagnosis not present

## 2017-10-27 DIAGNOSIS — R5382 Chronic fatigue, unspecified: Secondary | ICD-10-CM | POA: Diagnosis not present

## 2017-10-27 DIAGNOSIS — Z1339 Encounter for screening examination for other mental health and behavioral disorders: Secondary | ICD-10-CM | POA: Diagnosis not present

## 2017-10-27 DIAGNOSIS — Z636 Dependent relative needing care at home: Secondary | ICD-10-CM | POA: Diagnosis not present

## 2017-10-27 DIAGNOSIS — E538 Deficiency of other specified B group vitamins: Secondary | ICD-10-CM | POA: Diagnosis not present

## 2017-10-27 DIAGNOSIS — K769 Liver disease, unspecified: Secondary | ICD-10-CM | POA: Diagnosis not present

## 2017-10-27 DIAGNOSIS — E1165 Type 2 diabetes mellitus with hyperglycemia: Secondary | ICD-10-CM | POA: Diagnosis not present

## 2017-10-27 DIAGNOSIS — Z7189 Other specified counseling: Secondary | ICD-10-CM | POA: Diagnosis not present

## 2017-10-27 DIAGNOSIS — D696 Thrombocytopenia, unspecified: Secondary | ICD-10-CM | POA: Diagnosis not present

## 2017-10-27 DIAGNOSIS — Z79899 Other long term (current) drug therapy: Secondary | ICD-10-CM | POA: Diagnosis not present

## 2017-10-27 DIAGNOSIS — Z6825 Body mass index (BMI) 25.0-25.9, adult: Secondary | ICD-10-CM | POA: Diagnosis not present

## 2017-10-27 DIAGNOSIS — I10 Essential (primary) hypertension: Secondary | ICD-10-CM | POA: Diagnosis not present

## 2017-10-28 DIAGNOSIS — K7581 Nonalcoholic steatohepatitis (NASH): Secondary | ICD-10-CM | POA: Diagnosis not present

## 2017-10-28 DIAGNOSIS — K7460 Unspecified cirrhosis of liver: Secondary | ICD-10-CM | POA: Diagnosis not present

## 2017-10-31 LAB — AFP TUMOR MARKER: AFP TUMOR MARKER: 2.2 ng/mL

## 2017-11-07 ENCOUNTER — Other Ambulatory Visit (INDEPENDENT_AMBULATORY_CARE_PROVIDER_SITE_OTHER): Payer: Self-pay | Admitting: *Deleted

## 2017-11-07 DIAGNOSIS — K7581 Nonalcoholic steatohepatitis (NASH): Secondary | ICD-10-CM

## 2017-11-07 DIAGNOSIS — K7460 Unspecified cirrhosis of liver: Secondary | ICD-10-CM

## 2017-11-23 DIAGNOSIS — Z23 Encounter for immunization: Secondary | ICD-10-CM | POA: Diagnosis not present

## 2017-12-20 DIAGNOSIS — K769 Liver disease, unspecified: Secondary | ICD-10-CM | POA: Diagnosis not present

## 2017-12-20 DIAGNOSIS — D500 Iron deficiency anemia secondary to blood loss (chronic): Secondary | ICD-10-CM | POA: Diagnosis not present

## 2017-12-20 DIAGNOSIS — D696 Thrombocytopenia, unspecified: Secondary | ICD-10-CM | POA: Diagnosis not present

## 2017-12-27 DIAGNOSIS — Z636 Dependent relative needing care at home: Secondary | ICD-10-CM | POA: Diagnosis not present

## 2017-12-27 DIAGNOSIS — K7460 Unspecified cirrhosis of liver: Secondary | ICD-10-CM | POA: Diagnosis not present

## 2017-12-27 DIAGNOSIS — D508 Other iron deficiency anemias: Secondary | ICD-10-CM | POA: Diagnosis not present

## 2017-12-27 DIAGNOSIS — Z6826 Body mass index (BMI) 26.0-26.9, adult: Secondary | ICD-10-CM | POA: Diagnosis not present

## 2017-12-27 DIAGNOSIS — E538 Deficiency of other specified B group vitamins: Secondary | ICD-10-CM | POA: Diagnosis not present

## 2017-12-30 DIAGNOSIS — E2839 Other primary ovarian failure: Secondary | ICD-10-CM | POA: Diagnosis not present

## 2018-01-04 DIAGNOSIS — D509 Iron deficiency anemia, unspecified: Secondary | ICD-10-CM | POA: Diagnosis not present

## 2018-01-11 DIAGNOSIS — D509 Iron deficiency anemia, unspecified: Secondary | ICD-10-CM | POA: Diagnosis not present

## 2018-02-01 DIAGNOSIS — Z299 Encounter for prophylactic measures, unspecified: Secondary | ICD-10-CM | POA: Diagnosis not present

## 2018-02-01 DIAGNOSIS — Z6826 Body mass index (BMI) 26.0-26.9, adult: Secondary | ICD-10-CM | POA: Diagnosis not present

## 2018-02-01 DIAGNOSIS — R011 Cardiac murmur, unspecified: Secondary | ICD-10-CM | POA: Diagnosis not present

## 2018-02-01 DIAGNOSIS — K7460 Unspecified cirrhosis of liver: Secondary | ICD-10-CM | POA: Diagnosis not present

## 2018-02-01 DIAGNOSIS — I10 Essential (primary) hypertension: Secondary | ICD-10-CM | POA: Diagnosis not present

## 2018-02-01 DIAGNOSIS — E1142 Type 2 diabetes mellitus with diabetic polyneuropathy: Secondary | ICD-10-CM | POA: Diagnosis not present

## 2018-02-01 DIAGNOSIS — E1165 Type 2 diabetes mellitus with hyperglycemia: Secondary | ICD-10-CM | POA: Diagnosis not present

## 2018-02-13 DIAGNOSIS — R011 Cardiac murmur, unspecified: Secondary | ICD-10-CM | POA: Diagnosis not present

## 2018-03-10 ENCOUNTER — Encounter (INDEPENDENT_AMBULATORY_CARE_PROVIDER_SITE_OTHER): Payer: Self-pay | Admitting: *Deleted

## 2018-03-13 ENCOUNTER — Encounter (INDEPENDENT_AMBULATORY_CARE_PROVIDER_SITE_OTHER): Payer: Self-pay | Admitting: *Deleted

## 2018-03-13 ENCOUNTER — Other Ambulatory Visit (INDEPENDENT_AMBULATORY_CARE_PROVIDER_SITE_OTHER): Payer: Self-pay | Admitting: *Deleted

## 2018-03-13 DIAGNOSIS — K7581 Nonalcoholic steatohepatitis (NASH): Secondary | ICD-10-CM

## 2018-03-13 DIAGNOSIS — K7460 Unspecified cirrhosis of liver: Secondary | ICD-10-CM

## 2018-03-15 ENCOUNTER — Other Ambulatory Visit (INDEPENDENT_AMBULATORY_CARE_PROVIDER_SITE_OTHER): Payer: Self-pay | Admitting: *Deleted

## 2018-03-15 DIAGNOSIS — K7469 Other cirrhosis of liver: Secondary | ICD-10-CM

## 2018-03-15 DIAGNOSIS — K7581 Nonalcoholic steatohepatitis (NASH): Secondary | ICD-10-CM

## 2018-03-20 ENCOUNTER — Ambulatory Visit (HOSPITAL_COMMUNITY)
Admission: RE | Admit: 2018-03-20 | Discharge: 2018-03-20 | Disposition: A | Discharge status: Home / Self Care | Payer: Medicare Other | Source: Ambulatory Visit | Attending: Internal Medicine | Admitting: Internal Medicine

## 2018-03-20 DIAGNOSIS — K7460 Unspecified cirrhosis of liver: Secondary | ICD-10-CM | POA: Diagnosis not present

## 2018-03-20 DIAGNOSIS — K7469 Other cirrhosis of liver: Secondary | ICD-10-CM | POA: Insufficient documentation

## 2018-03-20 DIAGNOSIS — K7581 Nonalcoholic steatohepatitis (NASH): Secondary | ICD-10-CM | POA: Insufficient documentation

## 2018-03-23 DIAGNOSIS — K7460 Unspecified cirrhosis of liver: Secondary | ICD-10-CM | POA: Diagnosis not present

## 2018-03-23 DIAGNOSIS — R5382 Chronic fatigue, unspecified: Secondary | ICD-10-CM | POA: Diagnosis not present

## 2018-03-23 DIAGNOSIS — E538 Deficiency of other specified B group vitamins: Secondary | ICD-10-CM | POA: Diagnosis not present

## 2018-03-23 DIAGNOSIS — D508 Other iron deficiency anemias: Secondary | ICD-10-CM | POA: Diagnosis not present

## 2018-03-30 DIAGNOSIS — D696 Thrombocytopenia, unspecified: Secondary | ICD-10-CM | POA: Diagnosis not present

## 2018-03-30 DIAGNOSIS — D508 Other iron deficiency anemias: Secondary | ICD-10-CM | POA: Diagnosis not present

## 2018-03-30 DIAGNOSIS — K769 Liver disease, unspecified: Secondary | ICD-10-CM | POA: Diagnosis not present

## 2018-03-30 DIAGNOSIS — Z6826 Body mass index (BMI) 26.0-26.9, adult: Secondary | ICD-10-CM | POA: Diagnosis not present

## 2018-03-30 DIAGNOSIS — E538 Deficiency of other specified B group vitamins: Secondary | ICD-10-CM | POA: Diagnosis not present

## 2018-04-04 ENCOUNTER — Encounter (INDEPENDENT_AMBULATORY_CARE_PROVIDER_SITE_OTHER): Payer: Self-pay | Admitting: Internal Medicine

## 2018-04-04 ENCOUNTER — Ambulatory Visit (INDEPENDENT_AMBULATORY_CARE_PROVIDER_SITE_OTHER): Payer: Medicare Other | Admitting: Internal Medicine

## 2018-04-04 VITALS — BP 134/67 | HR 57 | Temp 97.9°F | Resp 18 | Ht 66.0 in | Wt 142.1 lb

## 2018-04-04 DIAGNOSIS — D500 Iron deficiency anemia secondary to blood loss (chronic): Secondary | ICD-10-CM

## 2018-04-04 DIAGNOSIS — K7460 Unspecified cirrhosis of liver: Secondary | ICD-10-CM

## 2018-04-04 NOTE — Patient Instructions (Signed)
Next ultrasound would be in 6 months. Please call office if you have tarry stool or rectal bleeding.

## 2018-04-04 NOTE — Progress Notes (Signed)
Presenting complaint;  Follow-up for cirrhosis and iron deficiency anemia.  Database and subjective:  Patient is 78 year old Caucasian female who has history of iron deficiency anemia which is initially encountered about 9 years ago and more recently last year.  She also has a history of fatty liver and she was diagnosed with cirrhosis based on abdominal pelvic CT in October 2018 revealing nodular/cirrhotic liver and splenomegaly.   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  Polypectomy was performed and these polyps are hyperplastic.  Polyps are hyperplastic.  She also had prepyloric intestinal metaplasia.  It was felt she could have been losing blood from gastric polyps. She had abdominal ultrasound for HCC screening 2 weeks ago.  Patient says she is doing very well.  She had 2 infusions of iron back in December 2019 by Dr. Federico Flake.  She had blood work on 03/23/2018 and her hemoglobin was normal. She denies melena or rectal bleeding diarrhea or constipation.  She also denies abdominal pain.  She does not do scheduled walking or exercise but she stays busy.  She does housework and she also goes up and down the stairs few times a day. She says she has had vaccination for hepatitis A B as well as for herpes zoster and she also gets flu shot every year.  She states she had pertussis vaccine last year.   Current Medications: Outpatient Encounter Medications as of 04/04/2018  Medication Sig  . acetaminophen (TYLENOL) 500 MG tablet Take 1,000 mg by mouth every 6 (six) hours as needed for moderate pain or headache.   Marland Kitchen aspirin 81 MG tablet Take 1 tablet (81 mg total) by mouth at bedtime.  Marland Kitchen atenolol (TENORMIN) 50 MG tablet Take 50 mg by mouth daily.    . cyanocobalamin (,VITAMIN B-12,) 1000 MCG/ML injection Inject 1,000 mcg into the muscle every 30 (thirty) days.  . dapagliflozin propanediol (FARXIGA)  10 MG TABS tablet Take 10 mg by mouth daily.   Marland Kitchen glimepiride (AMARYL) 4 MG tablet Take 4 mg by mouth daily.  Marland Kitchen lisinopril (PRINIVIL,ZESTRIL) 40 MG tablet Take 40 mg by mouth daily.  . metFORMIN (GLUCOPHAGE) 500 MG tablet Take 1,000 mg by mouth 2 (two) times daily with a meal.   . ZINC OXIDE EX Apply 1 application topically every other day.  . folic acid (FOLVITE) 1 MG tablet Take 1 mg by mouth 2 (two) times daily.   . [DISCONTINUED] augmented betamethasone dipropionate (DIPROLENE-AF) 0.05 % ointment Apply 1 application topically 2 (two) times daily as needed (for psoriasis).   . [DISCONTINUED] HYDROcodone-acetaminophen (NORCO) 7.5-325 MG tablet Take 1 tablet by mouth daily as needed for severe pain.    No facility-administered encounter medications on file as of 04/04/2018.      Objective: Blood pressure 134/67, pulse (!) 57, temperature 97.9 F (36.6 C), temperature source Oral, resp. rate 18, height 5' 6"  (1.676 m), weight 142 lb 1.6 oz (64.5 kg). Patient is alert and in no acute distress. She does not have asterixis. She has scar in right submandibular region from prior parotidectomy Conjunctiva is pink. Sclera is nonicteric Oropharyngeal mucosa is normal. No neck masses or thyromegaly noted. Cardiac exam with regular rhythm normal S1 and S2. No murmur or gallop noted. Lungs are clear to auscultation. Abdomen is symmetrical soft and nontender.  Spleen is nonpalpable.  Liver edge is easily palpable below right costal margin and is firm. No LE  edema or clubbing noted.  Labs/studies Results:  Lab data from 03/23/2018 WBC 3.1, H&H 13.3 and 39.3 and platelet count 51K.   Serum sodium 142, potassium 4.0, chloride 106, CO2 26 BUN 14 and creatinine 0.69 Glucose 108 Serum calcium 9.7 Bilirubin 0.5 AP 74, AST 47 and ALT 35, total protein 6.8 and albumin 4.3.  AFP was 2.2 on 10/28/2017.  Ultrasound on 03/20/2018 revealed cirrhotic liver and mild splenomegaly but no focal abnormalities  involving the liver or ascites.   Assessment:  #1.  Cirrhosis secondary to NASH.  She remains with well-preserved hepatic synthetic function.  Recent ultrasound was negative for focal hepatic abnormalities or ascites.  Last EGD was in January 2019 revealing single column of grade 1 esophageal varix. She is up-to-date on hep a and B vaccination.  Transaminases are mildly elevated.  #2.  History of iron deficiency anemia felt to be due to GI bleed.  She could have bled from ulcerated gastric polyp which was removed 13 months ago.  Hemoglobin about 2 weeks ago was normal.  It remains to be seen if her hemoglobin can be maintained without iron infusion.  She is scheduled to have blood work by Dr. Avel Peace of in 3 months.  Plan:  Patient advised to call office if she has melena or rectal bleeding in which case we will proceed with small bowel given capsule study. Next ultrasound in 6 months. Office visit in 6 months.

## 2018-04-10 DIAGNOSIS — K7460 Unspecified cirrhosis of liver: Secondary | ICD-10-CM | POA: Diagnosis not present

## 2018-04-10 DIAGNOSIS — K7581 Nonalcoholic steatohepatitis (NASH): Secondary | ICD-10-CM | POA: Diagnosis not present

## 2018-04-11 LAB — AFP TUMOR MARKER: AFP-Tumor Marker: 2.8 ng/mL

## 2018-04-24 DIAGNOSIS — H353132 Nonexudative age-related macular degeneration, bilateral, intermediate dry stage: Secondary | ICD-10-CM | POA: Diagnosis not present

## 2018-05-16 DIAGNOSIS — Z6826 Body mass index (BMI) 26.0-26.9, adult: Secondary | ICD-10-CM | POA: Diagnosis not present

## 2018-05-16 DIAGNOSIS — E1142 Type 2 diabetes mellitus with diabetic polyneuropathy: Secondary | ICD-10-CM | POA: Diagnosis not present

## 2018-05-16 DIAGNOSIS — R51 Headache: Secondary | ICD-10-CM | POA: Diagnosis not present

## 2018-05-16 DIAGNOSIS — I10 Essential (primary) hypertension: Secondary | ICD-10-CM | POA: Diagnosis not present

## 2018-05-16 DIAGNOSIS — Z299 Encounter for prophylactic measures, unspecified: Secondary | ICD-10-CM | POA: Diagnosis not present

## 2018-05-16 DIAGNOSIS — Z87891 Personal history of nicotine dependence: Secondary | ICD-10-CM | POA: Diagnosis not present

## 2018-05-16 DIAGNOSIS — E1165 Type 2 diabetes mellitus with hyperglycemia: Secondary | ICD-10-CM | POA: Diagnosis not present

## 2018-06-26 DIAGNOSIS — Z01818 Encounter for other preprocedural examination: Secondary | ICD-10-CM | POA: Diagnosis not present

## 2018-06-26 DIAGNOSIS — H25812 Combined forms of age-related cataract, left eye: Secondary | ICD-10-CM | POA: Diagnosis not present

## 2018-06-26 DIAGNOSIS — H353132 Nonexudative age-related macular degeneration, bilateral, intermediate dry stage: Secondary | ICD-10-CM | POA: Diagnosis not present

## 2018-06-26 DIAGNOSIS — E538 Deficiency of other specified B group vitamins: Secondary | ICD-10-CM | POA: Diagnosis not present

## 2018-06-26 DIAGNOSIS — D508 Other iron deficiency anemias: Secondary | ICD-10-CM | POA: Diagnosis not present

## 2018-06-29 DIAGNOSIS — Z299 Encounter for prophylactic measures, unspecified: Secondary | ICD-10-CM | POA: Diagnosis not present

## 2018-06-29 DIAGNOSIS — E538 Deficiency of other specified B group vitamins: Secondary | ICD-10-CM | POA: Diagnosis not present

## 2018-06-29 DIAGNOSIS — E1165 Type 2 diabetes mellitus with hyperglycemia: Secondary | ICD-10-CM | POA: Diagnosis not present

## 2018-06-29 DIAGNOSIS — R5382 Chronic fatigue, unspecified: Secondary | ICD-10-CM | POA: Diagnosis not present

## 2018-06-29 DIAGNOSIS — I10 Essential (primary) hypertension: Secondary | ICD-10-CM | POA: Diagnosis not present

## 2018-06-29 DIAGNOSIS — D508 Other iron deficiency anemias: Secondary | ICD-10-CM | POA: Diagnosis not present

## 2018-06-29 DIAGNOSIS — K769 Liver disease, unspecified: Secondary | ICD-10-CM | POA: Diagnosis not present

## 2018-06-29 DIAGNOSIS — E1142 Type 2 diabetes mellitus with diabetic polyneuropathy: Secondary | ICD-10-CM | POA: Diagnosis not present

## 2018-06-29 DIAGNOSIS — Z6826 Body mass index (BMI) 26.0-26.9, adult: Secondary | ICD-10-CM | POA: Diagnosis not present

## 2018-06-29 DIAGNOSIS — I639 Cerebral infarction, unspecified: Secondary | ICD-10-CM | POA: Diagnosis not present

## 2018-06-29 DIAGNOSIS — D696 Thrombocytopenia, unspecified: Secondary | ICD-10-CM | POA: Diagnosis not present

## 2018-07-05 DIAGNOSIS — H2512 Age-related nuclear cataract, left eye: Secondary | ICD-10-CM | POA: Diagnosis not present

## 2018-07-05 DIAGNOSIS — H25812 Combined forms of age-related cataract, left eye: Secondary | ICD-10-CM | POA: Diagnosis not present

## 2018-07-19 DIAGNOSIS — H2511 Age-related nuclear cataract, right eye: Secondary | ICD-10-CM | POA: Diagnosis not present

## 2018-07-19 DIAGNOSIS — H25811 Combined forms of age-related cataract, right eye: Secondary | ICD-10-CM | POA: Diagnosis not present

## 2018-08-10 DIAGNOSIS — D508 Other iron deficiency anemias: Secondary | ICD-10-CM | POA: Diagnosis not present

## 2018-08-10 DIAGNOSIS — R5382 Chronic fatigue, unspecified: Secondary | ICD-10-CM | POA: Diagnosis not present

## 2018-08-21 DIAGNOSIS — Z299 Encounter for prophylactic measures, unspecified: Secondary | ICD-10-CM | POA: Diagnosis not present

## 2018-08-21 DIAGNOSIS — I10 Essential (primary) hypertension: Secondary | ICD-10-CM | POA: Diagnosis not present

## 2018-08-21 DIAGNOSIS — Z6826 Body mass index (BMI) 26.0-26.9, adult: Secondary | ICD-10-CM | POA: Diagnosis not present

## 2018-08-21 DIAGNOSIS — E1142 Type 2 diabetes mellitus with diabetic polyneuropathy: Secondary | ICD-10-CM | POA: Diagnosis not present

## 2018-08-21 DIAGNOSIS — E1165 Type 2 diabetes mellitus with hyperglycemia: Secondary | ICD-10-CM | POA: Diagnosis not present

## 2018-08-21 DIAGNOSIS — I6529 Occlusion and stenosis of unspecified carotid artery: Secondary | ICD-10-CM | POA: Diagnosis not present

## 2018-08-21 DIAGNOSIS — R609 Edema, unspecified: Secondary | ICD-10-CM | POA: Diagnosis not present

## 2018-08-29 DIAGNOSIS — M79604 Pain in right leg: Secondary | ICD-10-CM | POA: Diagnosis not present

## 2018-08-29 DIAGNOSIS — E1140 Type 2 diabetes mellitus with diabetic neuropathy, unspecified: Secondary | ICD-10-CM | POA: Diagnosis not present

## 2018-08-29 DIAGNOSIS — M79605 Pain in left leg: Secondary | ICD-10-CM | POA: Diagnosis not present

## 2018-09-22 DIAGNOSIS — D508 Other iron deficiency anemias: Secondary | ICD-10-CM | POA: Diagnosis not present

## 2018-09-22 DIAGNOSIS — R5382 Chronic fatigue, unspecified: Secondary | ICD-10-CM | POA: Diagnosis not present

## 2018-09-22 DIAGNOSIS — D72819 Decreased white blood cell count, unspecified: Secondary | ICD-10-CM | POA: Diagnosis not present

## 2018-09-25 ENCOUNTER — Encounter (INDEPENDENT_AMBULATORY_CARE_PROVIDER_SITE_OTHER): Payer: Self-pay | Admitting: *Deleted

## 2018-09-27 DIAGNOSIS — I6350 Cerebral infarction due to unspecified occlusion or stenosis of unspecified cerebral artery: Secondary | ICD-10-CM | POA: Diagnosis not present

## 2018-09-27 DIAGNOSIS — I63039 Cerebral infarction due to thrombosis of unspecified carotid artery: Secondary | ICD-10-CM | POA: Diagnosis not present

## 2018-09-29 DIAGNOSIS — D508 Other iron deficiency anemias: Secondary | ICD-10-CM | POA: Diagnosis not present

## 2018-09-29 DIAGNOSIS — E538 Deficiency of other specified B group vitamins: Secondary | ICD-10-CM | POA: Diagnosis not present

## 2018-09-29 DIAGNOSIS — Z6826 Body mass index (BMI) 26.0-26.9, adult: Secondary | ICD-10-CM | POA: Diagnosis not present

## 2018-09-29 DIAGNOSIS — K7460 Unspecified cirrhosis of liver: Secondary | ICD-10-CM | POA: Diagnosis not present

## 2018-10-03 ENCOUNTER — Encounter (INDEPENDENT_AMBULATORY_CARE_PROVIDER_SITE_OTHER): Payer: Self-pay | Admitting: *Deleted

## 2018-10-03 ENCOUNTER — Ambulatory Visit (INDEPENDENT_AMBULATORY_CARE_PROVIDER_SITE_OTHER): Payer: Medicare Other | Admitting: Internal Medicine

## 2018-10-03 ENCOUNTER — Encounter (INDEPENDENT_AMBULATORY_CARE_PROVIDER_SITE_OTHER): Payer: Self-pay | Admitting: Internal Medicine

## 2018-10-03 ENCOUNTER — Other Ambulatory Visit (INDEPENDENT_AMBULATORY_CARE_PROVIDER_SITE_OTHER): Payer: Self-pay | Admitting: *Deleted

## 2018-10-03 ENCOUNTER — Other Ambulatory Visit: Payer: Self-pay

## 2018-10-03 VITALS — BP 129/57 | HR 65 | Temp 98.3°F | Resp 18 | Ht 68.0 in | Wt 143.7 lb

## 2018-10-03 DIAGNOSIS — K7460 Unspecified cirrhosis of liver: Secondary | ICD-10-CM | POA: Diagnosis not present

## 2018-10-03 DIAGNOSIS — K7581 Nonalcoholic steatohepatitis (NASH): Secondary | ICD-10-CM | POA: Diagnosis not present

## 2018-10-03 NOTE — Progress Notes (Signed)
Presenting complaint;  Follow-up for cirrhosis. Patient has history of iron deficiency anemia.  Database and subjective:  Patient is 78 year old Caucasian female who has history of fatty liver who was diagnosed with cirrhosis in October 2018 after she had CT which revealed nodular cirrhotic liver and splenomegaly.  She had EGD which revealed single column of grade 1 esophageal variix, portal hypertensive gastropathy and 3 polyps 1 of which was ulcerated.  These polyps turned out to be hyperplastic.  She also had intestinal metaplasia at antrum. She has about 10-year history of iron deficiency anemia.  She has undergone work-up in the past.  She has been receiving parenteral iron infusion in the past but has not had any in about 6 months.  She is under care of Dr. Federico Flake of [facility] center in [facility].  She states she is doing well.  She said this is the best she has felt in a while.  She denies nausea vomiting abdominal pain melena or rectal bleeding.  Her stools are at times dark because she is on iron.  She does experience diarrhea with certain foods such as orange juice and scrambled eggs. She says she takes a B12 injection every month and she remains on p.o. iron along with B12 pills twice daily. She had cataract surgery earlier this year.  She had one eye done in May and the second 1 in July.  She is very pleased with the results. Patient tells me that she has a sister sick age 23 who lives in [place] who has cirrhosis and thought to have hemochromatosis but patient tells me that the physician now do not believe that she has hemochromatosis.  Current Medications: Outpatient Encounter Medications as of 10/03/2018  Medication Sig  . acetaminophen (TYLENOL) 500 MG tablet Take 1,000 mg by mouth every 6 (six) hours as needed for moderate pain or headache.   Marland Kitchen aspirin 81 MG tablet Take 1 tablet (81 mg total) by mouth at bedtime.  Marland Kitchen atenolol (TENORMIN) 50 MG tablet Take  50 mg by mouth daily.    . Cholecalciferol (VITAMIN D3 PO) Take 2,000 Units by mouth daily.  . cyanocobalamin (,VITAMIN B-12,) 1000 MCG/ML injection Inject 1,000 mcg into the muscle every 30 (thirty) days.  . Cyanocobalamin (VITAMIN B 12 PO) Take 100 mg by mouth 2 (two) times daily. Takes 1 by mouth in the morning and 1 by mouth in the evening.  . dapagliflozin propanediol (FARXIGA) 10 MG TABS tablet Take 10 mg by mouth daily.   Marland Kitchen glimepiride (AMARYL) 4 MG tablet Take 4 mg by mouth daily.  Marland Kitchen lisinopril (PRINIVIL,ZESTRIL) 40 MG tablet Take 40 mg by mouth daily.  . metFORMIN (GLUCOPHAGE) 500 MG tablet Take 1,000 mg by mouth 2 (two) times daily with a meal.   . OVER THE COUNTER MEDICATION Patient states that she takes OTC Iron by mouth 1 time daily.  Marland Kitchen ZINC OXIDE EX Apply 1 application topically every other day.  . [DISCONTINUED] folic acid (FOLVITE) 1 MG tablet Take 1 mg by mouth 2 (two) times daily.    No facility-administered encounter medications on file as of 10/03/2018.      Objective: Blood pressure (!) 129/57, pulse 65, temperature 98.3 F (36.8 C), temperature source Oral, resp. rate 18, height 5' 8"  (1.727 m), weight 143 lb 11.2 oz (65.2 kg). Patient is alert and in no acute distress. She is wearing facial mask. Asterixis absent. Conjunctiva is pink. Sclera is nonicteric Oropharyngeal mucosa is normal. No  neck masses or thyromegaly noted. Cardiac exam with regular rhythm normal S1 and S2.  She has faint systolic murmur best heard at aortic area. Lungs are clear to auscultation. Abdomen is symmetrical.  On palpation is soft.  Liver edge is easily palpable below right costal margin.  Liver edge is firm.  Spleen is nonpalpable.  Shifting dullness absent. No LE edema or clubbing noted.  Labs/studies Results: Lab data from 09/22/2018 WBC 4.2, H&H 12.7 and 38.0 and platelet count 60 7K. Serum iron 45.  BUN 18 creatinine 0.80 Glucose 115 Serum sodium 139, potassium 4.6, chloride  104, CO2 24 Serum calcium 10.3.  Bilirubin 0.4, AP 72, AST 39, ALT 36, total protein 6.8 and albumin 4.2.  AFP was 2.8 on 04/10/2018.  Assessment:  #1.  Cirrhosis secondary to nonalcoholic steatohepatitis.  Risk factors include diabetes mellitus.  She remains with well-preserved hepatic function.  She does have stigmata of portal hypertension in the form of splenomegaly and thrombocytopenia but she does not have ascites.  She is due for [facility] screening. She has mildly elevated ALT due to fatty liver.  Doubt other mechanisms.  #2.  History of iron deficiency anemia for several years.  Lately she has not required iron infusion and her H&H remains normal.  Plan:  Patient will have alpha-fetoprotein with her next blood work.  Paperwork provided to the patient. Abdominal ultrasound for [place] screening. Office visit in 6 months.

## 2018-10-03 NOTE — Patient Instructions (Signed)
Physician will call with results of blood test when completed.

## 2018-10-06 ENCOUNTER — Other Ambulatory Visit: Payer: Self-pay

## 2018-10-06 ENCOUNTER — Ambulatory Visit (HOSPITAL_COMMUNITY)
Admission: RE | Admit: 2018-10-06 | Discharge: 2018-10-06 | Disposition: A | Discharge status: Home / Self Care | Payer: Medicare Other | Source: Ambulatory Visit | Attending: Internal Medicine | Admitting: Internal Medicine

## 2018-10-06 DIAGNOSIS — K7460 Unspecified cirrhosis of liver: Secondary | ICD-10-CM | POA: Diagnosis not present

## 2018-10-06 DIAGNOSIS — N281 Cyst of kidney, acquired: Secondary | ICD-10-CM | POA: Diagnosis not present

## 2018-10-06 IMAGING — US ULTRASOUND ABDOMEN COMPLETE
1 series · 13 of 25 positions shown · non-contrast
Comparison: [DATE]

Correlation: CT abdomen and pelvis [DATE]

CLINICAL DATA: Cirrhosis

EXAM:
ABDOMEN ULTRASOUND COMPLETE

[Series 1: ultrasound abdomen complete · 0.24mm/px · 13 of 168 slices shown]
[im 1/168]
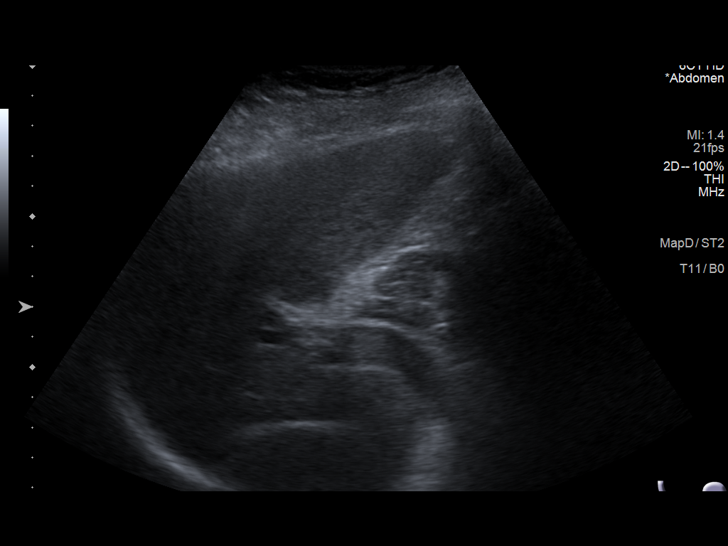
[im 14/168]
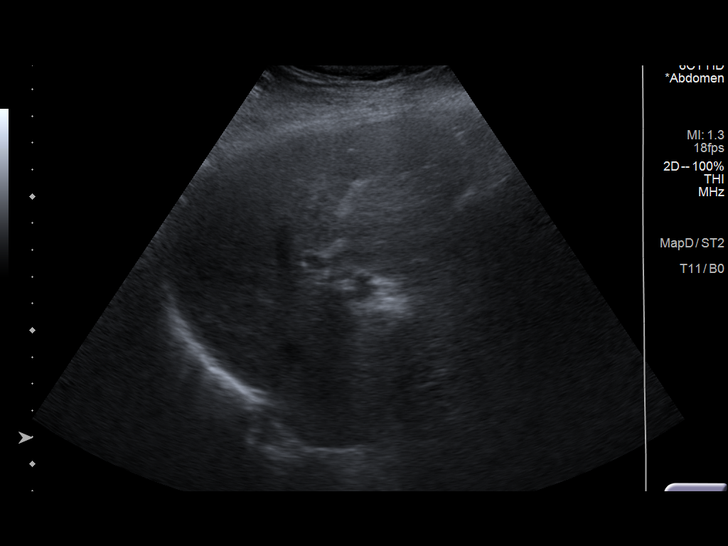
[im 28/168]
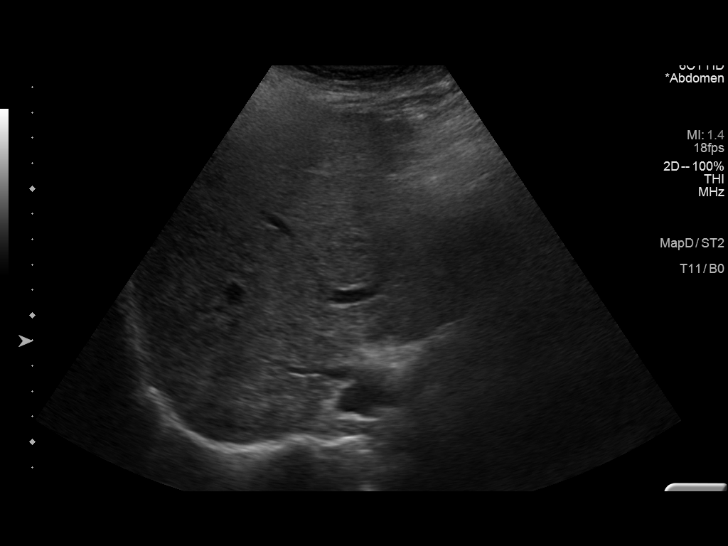
[im 42/168]
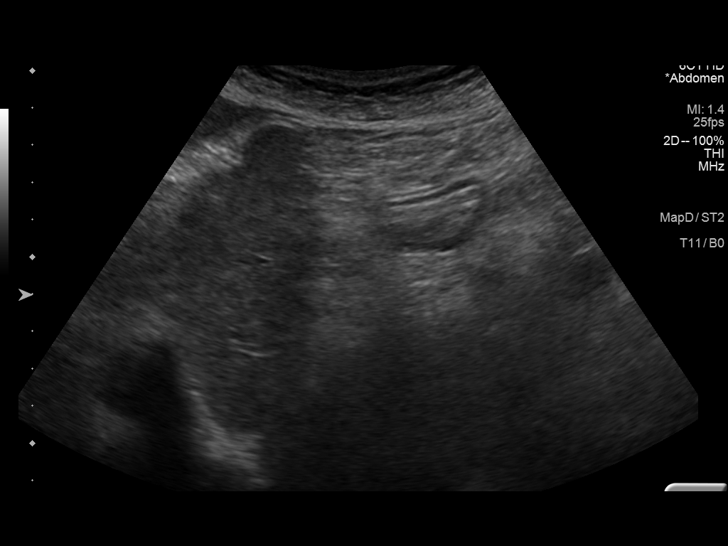
[im 56/168]
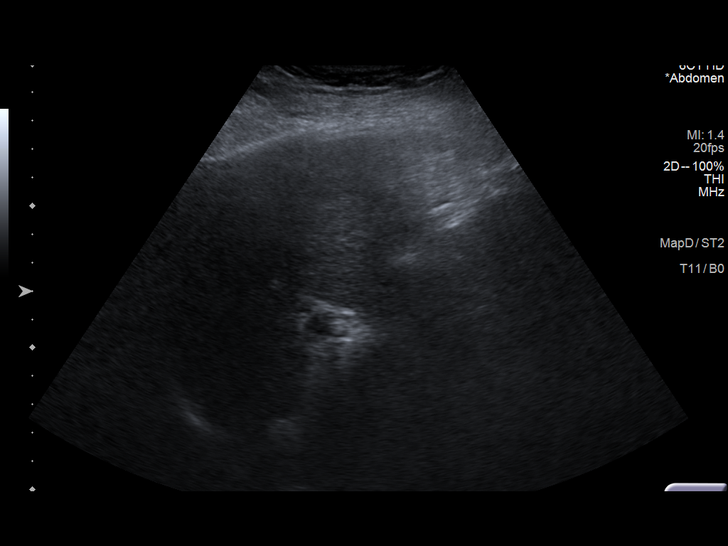
[im 70/168]
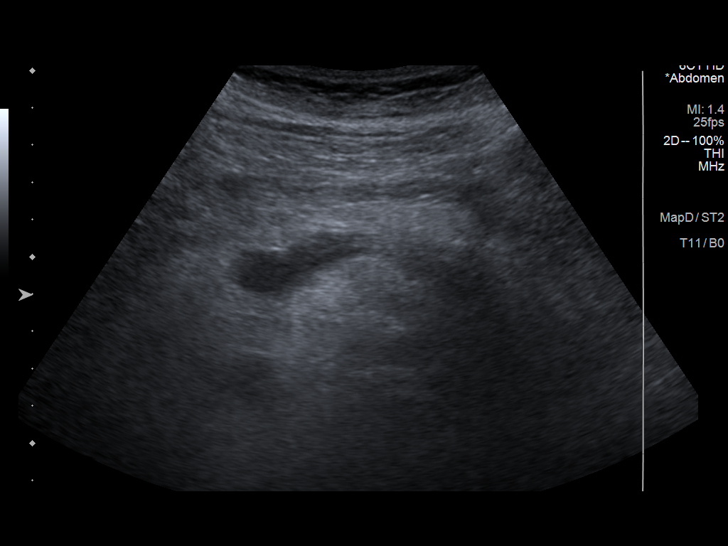
[im 84/168]
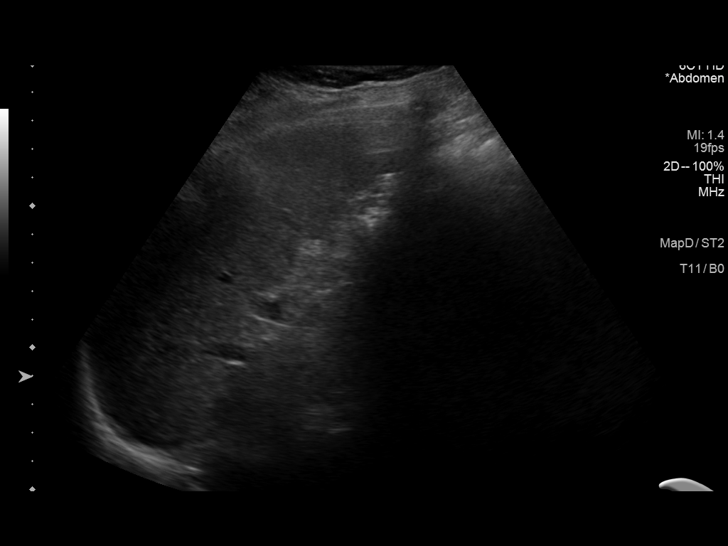
[im 98/168]
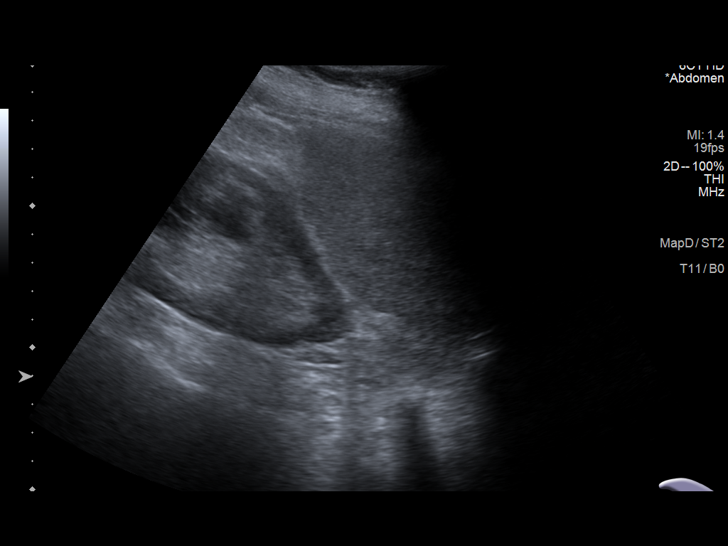
[im 112/168]
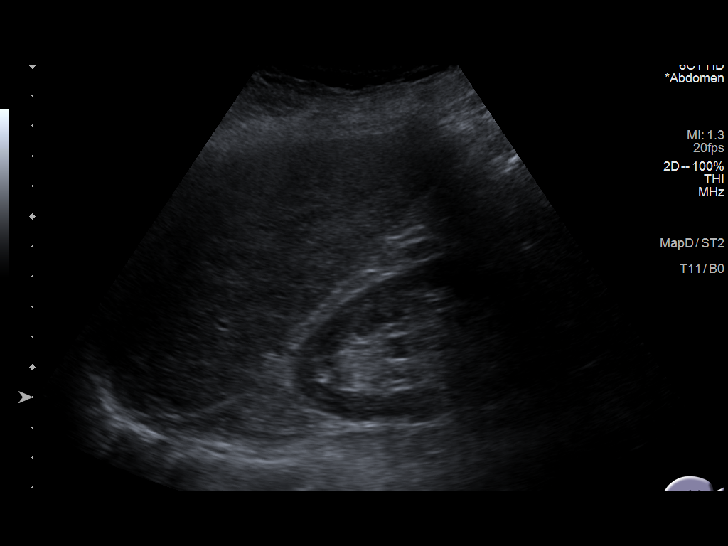
[im 126/168]
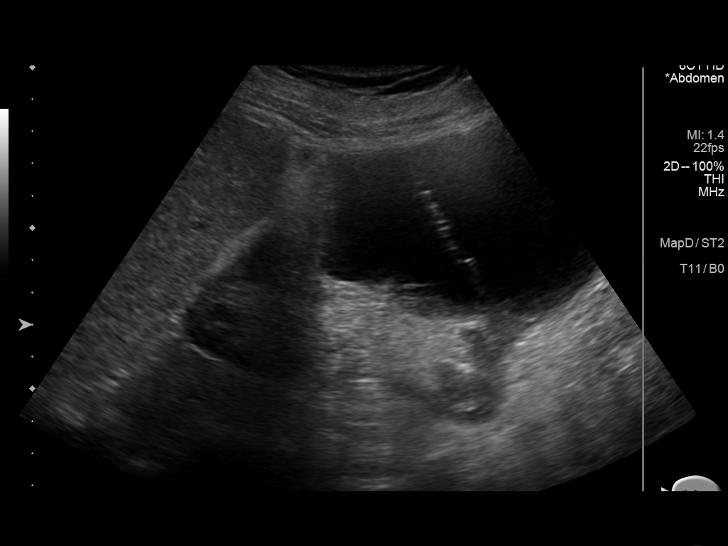
[im 140/168]
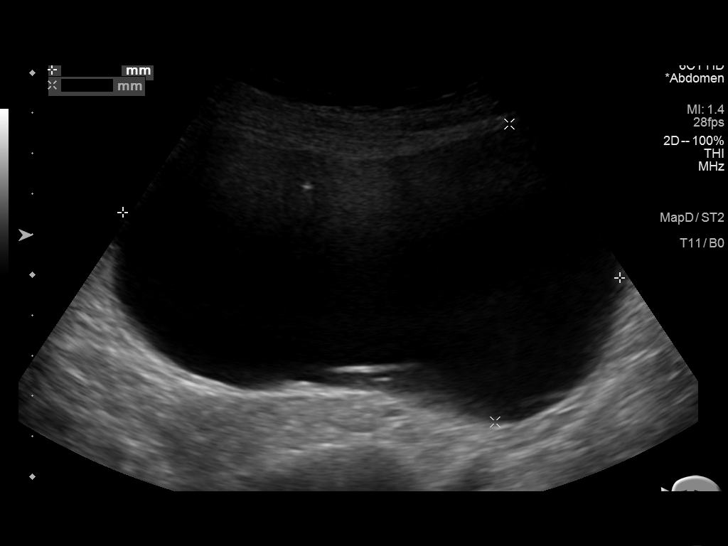
[im 154/168]
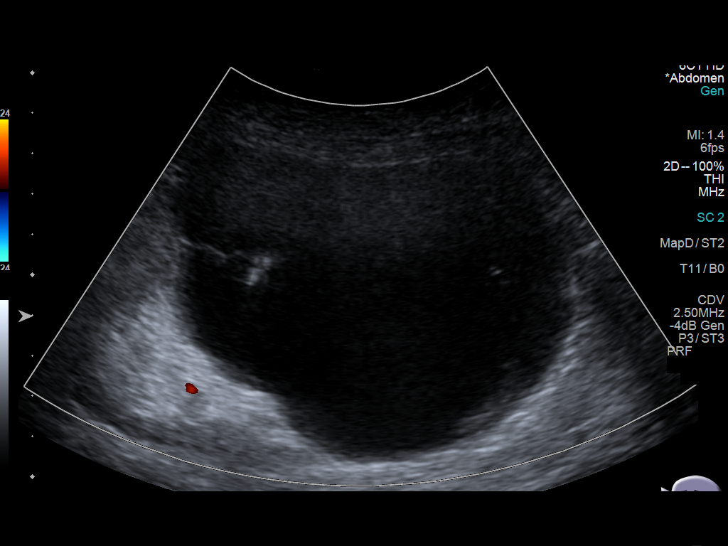
[im 168/168]
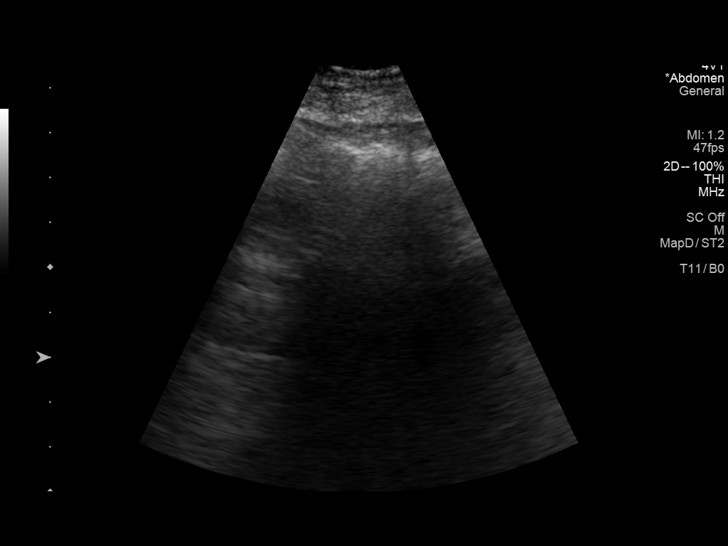

[13 of 25 positions shown; findings below may reference images not displayed]

FINDINGS: Gallbladder: Surgically absent

Common bile duct: Diameter: 9 mm diameter, previously 8 mm,
potentially normal post cholecystectomy.

Liver: Coarsened slightly increased echogenicity of the liver with
minimally nodular hepatic contour favoring cirrhosis. No focal
hepatic mass lesion identified. Portal vein is patent on color
Doppler imaging with normal direction of blood flow towards the
liver.

IVC: Intrahepatic portion normal appearance, remainder obscured by
bowel gas

Pancreas: Normal appearance

Spleen: Normal appearance, 9.4 cm length

Right Kidney: Length: 10.2 cm. Normal morphology without mass or
hydronephrosis.

Left Kidney: Length: 10.2 cm. Large septated cyst arising from mid
inferior LEFT kidney 12.4 x 7.4 x 10.0 cm; this measured 13.2 x
x 10.7 cm on the prior exam. One of the septations is slightly
irregular, also noted previously. On the CT exam from [W5] this
measured 11.7 x 9.3 x 9.4 cm. The report from an earlier CT from
[W5] indicated this measured 9.3 cm in greatest size

Abdominal aorta: Normal caliber of visualized portions. Distal aorta
obscured by bowel gas.

Other findings: No free fluid
IMPRESSION: 9 mm CBD post cholecystectomy, little changed from 8 mm on previous
exam, potentially physiologic, recommend correlation with LFTs.

Large complicated septated cyst at inferior pole LEFT kidney 12.4 cm
in greatest size, grossly unchanged.

## 2018-10-11 DIAGNOSIS — Z6826 Body mass index (BMI) 26.0-26.9, adult: Secondary | ICD-10-CM | POA: Diagnosis not present

## 2018-10-11 DIAGNOSIS — E1142 Type 2 diabetes mellitus with diabetic polyneuropathy: Secondary | ICD-10-CM | POA: Diagnosis not present

## 2018-10-11 DIAGNOSIS — Z299 Encounter for prophylactic measures, unspecified: Secondary | ICD-10-CM | POA: Diagnosis not present

## 2018-10-11 DIAGNOSIS — I10 Essential (primary) hypertension: Secondary | ICD-10-CM | POA: Diagnosis not present

## 2018-10-11 DIAGNOSIS — D473 Essential (hemorrhagic) thrombocythemia: Secondary | ICD-10-CM | POA: Diagnosis not present

## 2018-10-11 DIAGNOSIS — E1165 Type 2 diabetes mellitus with hyperglycemia: Secondary | ICD-10-CM | POA: Diagnosis not present

## 2018-11-03 DIAGNOSIS — Z7189 Other specified counseling: Secondary | ICD-10-CM | POA: Diagnosis not present

## 2018-11-03 DIAGNOSIS — E559 Vitamin D deficiency, unspecified: Secondary | ICD-10-CM | POA: Diagnosis not present

## 2018-11-03 DIAGNOSIS — E7800 Pure hypercholesterolemia, unspecified: Secondary | ICD-10-CM | POA: Diagnosis not present

## 2018-11-03 DIAGNOSIS — Z1339 Encounter for screening examination for other mental health and behavioral disorders: Secondary | ICD-10-CM | POA: Diagnosis not present

## 2018-11-03 DIAGNOSIS — Z6826 Body mass index (BMI) 26.0-26.9, adult: Secondary | ICD-10-CM | POA: Diagnosis not present

## 2018-11-03 DIAGNOSIS — Z299 Encounter for prophylactic measures, unspecified: Secondary | ICD-10-CM | POA: Diagnosis not present

## 2018-11-03 DIAGNOSIS — Z0000 Encounter for general adult medical examination without abnormal findings: Secondary | ICD-10-CM | POA: Diagnosis not present

## 2018-11-03 DIAGNOSIS — R5383 Other fatigue: Secondary | ICD-10-CM | POA: Diagnosis not present

## 2018-11-03 DIAGNOSIS — I10 Essential (primary) hypertension: Secondary | ICD-10-CM | POA: Diagnosis not present

## 2018-11-03 DIAGNOSIS — Z1331 Encounter for screening for depression: Secondary | ICD-10-CM | POA: Diagnosis not present

## 2018-11-03 DIAGNOSIS — Z1211 Encounter for screening for malignant neoplasm of colon: Secondary | ICD-10-CM | POA: Diagnosis not present

## 2018-11-13 DIAGNOSIS — Z299 Encounter for prophylactic measures, unspecified: Secondary | ICD-10-CM | POA: Diagnosis not present

## 2018-11-13 DIAGNOSIS — Z87891 Personal history of nicotine dependence: Secondary | ICD-10-CM | POA: Diagnosis not present

## 2018-11-13 DIAGNOSIS — K7460 Unspecified cirrhosis of liver: Secondary | ICD-10-CM | POA: Diagnosis not present

## 2018-11-13 DIAGNOSIS — Z6826 Body mass index (BMI) 26.0-26.9, adult: Secondary | ICD-10-CM | POA: Diagnosis not present

## 2018-11-13 DIAGNOSIS — I10 Essential (primary) hypertension: Secondary | ICD-10-CM | POA: Diagnosis not present

## 2018-11-13 DIAGNOSIS — I639 Cerebral infarction, unspecified: Secondary | ICD-10-CM | POA: Diagnosis not present

## 2018-11-13 DIAGNOSIS — R1031 Right lower quadrant pain: Secondary | ICD-10-CM | POA: Diagnosis not present

## 2018-11-15 DIAGNOSIS — Z23 Encounter for immunization: Secondary | ICD-10-CM | POA: Diagnosis not present

## 2018-12-05 DIAGNOSIS — Z299 Encounter for prophylactic measures, unspecified: Secondary | ICD-10-CM | POA: Diagnosis not present

## 2018-12-05 DIAGNOSIS — E1142 Type 2 diabetes mellitus with diabetic polyneuropathy: Secondary | ICD-10-CM | POA: Diagnosis not present

## 2018-12-05 DIAGNOSIS — Z6826 Body mass index (BMI) 26.0-26.9, adult: Secondary | ICD-10-CM | POA: Diagnosis not present

## 2018-12-05 DIAGNOSIS — E1165 Type 2 diabetes mellitus with hyperglycemia: Secondary | ICD-10-CM | POA: Diagnosis not present

## 2018-12-05 DIAGNOSIS — I10 Essential (primary) hypertension: Secondary | ICD-10-CM | POA: Diagnosis not present

## 2019-01-22 DIAGNOSIS — D72819 Decreased white blood cell count, unspecified: Secondary | ICD-10-CM | POA: Diagnosis not present

## 2019-01-22 DIAGNOSIS — R197 Diarrhea, unspecified: Secondary | ICD-10-CM | POA: Diagnosis not present

## 2019-01-22 DIAGNOSIS — D508 Other iron deficiency anemias: Secondary | ICD-10-CM | POA: Diagnosis not present

## 2019-01-22 DIAGNOSIS — E538 Deficiency of other specified B group vitamins: Secondary | ICD-10-CM | POA: Diagnosis not present

## 2019-01-25 ENCOUNTER — Telehealth (INDEPENDENT_AMBULATORY_CARE_PROVIDER_SITE_OTHER): Payer: Self-pay | Admitting: Internal Medicine

## 2019-01-25 DIAGNOSIS — E538 Deficiency of other specified B group vitamins: Secondary | ICD-10-CM | POA: Diagnosis not present

## 2019-01-25 DIAGNOSIS — D508 Other iron deficiency anemias: Secondary | ICD-10-CM | POA: Diagnosis not present

## 2019-01-25 DIAGNOSIS — Z6825 Body mass index (BMI) 25.0-25.9, adult: Secondary | ICD-10-CM | POA: Diagnosis not present

## 2019-01-25 DIAGNOSIS — D72819 Decreased white blood cell count, unspecified: Secondary | ICD-10-CM | POA: Diagnosis not present

## 2019-01-25 DIAGNOSIS — R197 Diarrhea, unspecified: Secondary | ICD-10-CM | POA: Diagnosis not present

## 2019-01-25 NOTE — Telephone Encounter (Signed)
Patient called stated she saw her hematologist and her took her off the iron pills - she is having an iron infusion on 12-17 - also stated her weight has gone from 147 to 136 lbs.  She has an appointment on 2/26 with Dr Laural Golden - stated her hematologist wanted her to see Dr Laural Golden asap.  -  Please advise

## 2019-01-29 NOTE — Telephone Encounter (Signed)
Reviewed with Dr.Rehman - he states that the patient may see one of the other providers or him. Please make appointment and let the patient know. Thank you.

## 2019-02-27 DIAGNOSIS — H353132 Nonexudative age-related macular degeneration, bilateral, intermediate dry stage: Secondary | ICD-10-CM | POA: Diagnosis not present

## 2019-02-27 DIAGNOSIS — E119 Type 2 diabetes mellitus without complications: Secondary | ICD-10-CM | POA: Diagnosis not present

## 2019-02-27 DIAGNOSIS — H43811 Vitreous degeneration, right eye: Secondary | ICD-10-CM | POA: Diagnosis not present

## 2019-03-02 DIAGNOSIS — D508 Other iron deficiency anemias: Secondary | ICD-10-CM | POA: Diagnosis not present

## 2019-03-02 DIAGNOSIS — K7460 Unspecified cirrhosis of liver: Secondary | ICD-10-CM | POA: Diagnosis not present

## 2019-03-02 DIAGNOSIS — R5382 Chronic fatigue, unspecified: Secondary | ICD-10-CM | POA: Diagnosis not present

## 2019-03-02 DIAGNOSIS — E538 Deficiency of other specified B group vitamins: Secondary | ICD-10-CM | POA: Diagnosis not present

## 2019-03-07 DIAGNOSIS — D500 Iron deficiency anemia secondary to blood loss (chronic): Secondary | ICD-10-CM | POA: Diagnosis not present

## 2019-03-07 DIAGNOSIS — D732 Chronic congestive splenomegaly: Secondary | ICD-10-CM | POA: Diagnosis not present

## 2019-03-07 DIAGNOSIS — K7460 Unspecified cirrhosis of liver: Secondary | ICD-10-CM | POA: Diagnosis not present

## 2019-03-07 DIAGNOSIS — E1169 Type 2 diabetes mellitus with other specified complication: Secondary | ICD-10-CM | POA: Diagnosis not present

## 2019-03-07 DIAGNOSIS — E538 Deficiency of other specified B group vitamins: Secondary | ICD-10-CM | POA: Diagnosis not present

## 2019-03-07 DIAGNOSIS — N281 Cyst of kidney, acquired: Secondary | ICD-10-CM | POA: Diagnosis not present

## 2019-03-07 DIAGNOSIS — K769 Liver disease, unspecified: Secondary | ICD-10-CM | POA: Diagnosis not present

## 2019-03-07 DIAGNOSIS — Z6825 Body mass index (BMI) 25.0-25.9, adult: Secondary | ICD-10-CM | POA: Diagnosis not present

## 2019-03-07 DIAGNOSIS — Z23 Encounter for immunization: Secondary | ICD-10-CM | POA: Diagnosis not present

## 2019-03-07 DIAGNOSIS — D696 Thrombocytopenia, unspecified: Secondary | ICD-10-CM | POA: Diagnosis not present

## 2019-03-12 DIAGNOSIS — Z23 Encounter for immunization: Secondary | ICD-10-CM | POA: Insufficient documentation

## 2019-03-13 ENCOUNTER — Encounter (INDEPENDENT_AMBULATORY_CARE_PROVIDER_SITE_OTHER): Payer: Self-pay | Admitting: *Deleted

## 2019-03-13 DIAGNOSIS — I10 Essential (primary) hypertension: Secondary | ICD-10-CM | POA: Diagnosis not present

## 2019-03-13 DIAGNOSIS — Z6826 Body mass index (BMI) 26.0-26.9, adult: Secondary | ICD-10-CM | POA: Diagnosis not present

## 2019-03-13 DIAGNOSIS — E1165 Type 2 diabetes mellitus with hyperglycemia: Secondary | ICD-10-CM | POA: Diagnosis not present

## 2019-03-13 DIAGNOSIS — M1712 Unilateral primary osteoarthritis, left knee: Secondary | ICD-10-CM | POA: Diagnosis not present

## 2019-03-13 DIAGNOSIS — D696 Thrombocytopenia, unspecified: Secondary | ICD-10-CM | POA: Diagnosis not present

## 2019-03-13 DIAGNOSIS — Z87891 Personal history of nicotine dependence: Secondary | ICD-10-CM | POA: Diagnosis not present

## 2019-03-13 DIAGNOSIS — Z299 Encounter for prophylactic measures, unspecified: Secondary | ICD-10-CM | POA: Diagnosis not present

## 2019-03-19 ENCOUNTER — Other Ambulatory Visit (INDEPENDENT_AMBULATORY_CARE_PROVIDER_SITE_OTHER): Payer: Self-pay | Admitting: *Deleted

## 2019-03-19 DIAGNOSIS — K7460 Unspecified cirrhosis of liver: Secondary | ICD-10-CM

## 2019-03-19 DIAGNOSIS — M25562 Pain in left knee: Secondary | ICD-10-CM | POA: Diagnosis not present

## 2019-03-29 DIAGNOSIS — M1712 Unilateral primary osteoarthritis, left knee: Secondary | ICD-10-CM | POA: Diagnosis not present

## 2019-03-29 DIAGNOSIS — I639 Cerebral infarction, unspecified: Secondary | ICD-10-CM | POA: Diagnosis not present

## 2019-03-29 DIAGNOSIS — I10 Essential (primary) hypertension: Secondary | ICD-10-CM | POA: Diagnosis not present

## 2019-03-29 DIAGNOSIS — Z87891 Personal history of nicotine dependence: Secondary | ICD-10-CM | POA: Diagnosis not present

## 2019-03-29 DIAGNOSIS — Z6826 Body mass index (BMI) 26.0-26.9, adult: Secondary | ICD-10-CM | POA: Diagnosis not present

## 2019-03-29 DIAGNOSIS — Z299 Encounter for prophylactic measures, unspecified: Secondary | ICD-10-CM | POA: Diagnosis not present

## 2019-04-05 ENCOUNTER — Other Ambulatory Visit: Payer: Self-pay

## 2019-04-05 ENCOUNTER — Ambulatory Visit (HOSPITAL_COMMUNITY)
Admission: RE | Admit: 2019-04-05 | Discharge: 2019-04-05 | Disposition: A | Discharge status: Home / Self Care | Payer: Medicare Other | Source: Ambulatory Visit | Attending: Internal Medicine | Admitting: Internal Medicine

## 2019-04-05 DIAGNOSIS — K7460 Unspecified cirrhosis of liver: Secondary | ICD-10-CM | POA: Diagnosis not present

## 2019-04-05 IMAGING — US US ABDOMEN COMPLETE
1 series · 13 of 25 positions shown · non-contrast
Comparison: [DATE]

CLINICAL DATA: Cirrhosis.  Prior cholecystectomy.

EXAM:
ABDOMEN ULTRASOUND COMPLETE

[Series 1: us abdomen complete · 13 of 85 slices shown]
[im 1/85]
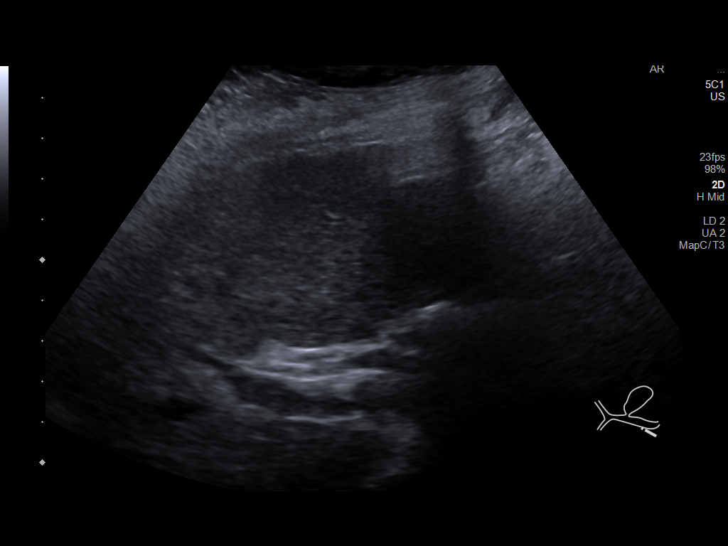
[im 8/85]
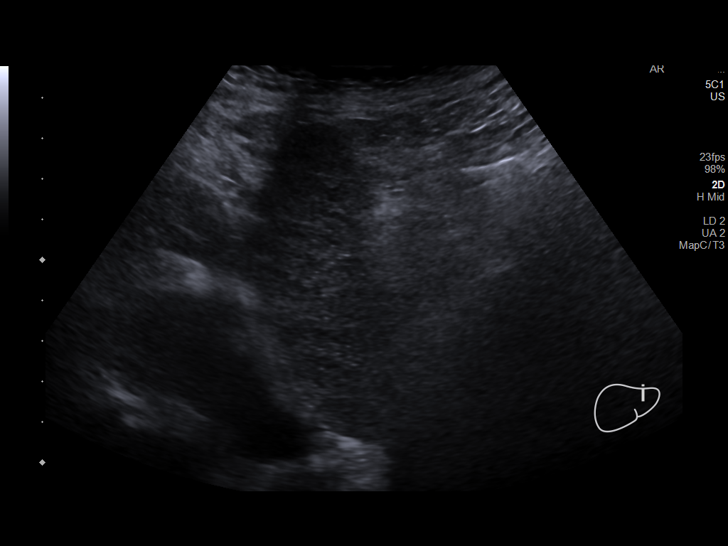
[im 15/85]
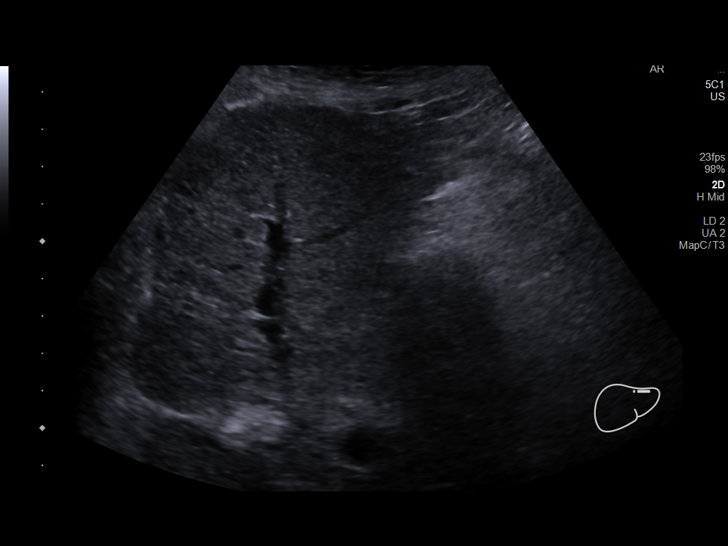
[im 22/85]
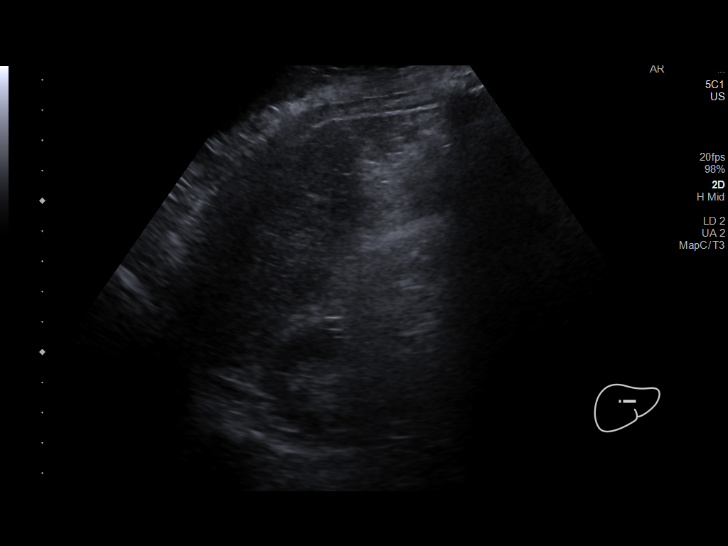
[im 29/85]
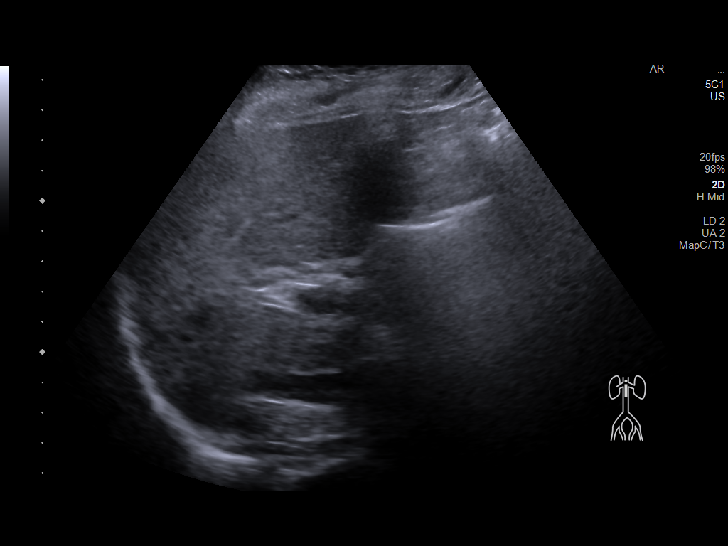
[im 36/85]
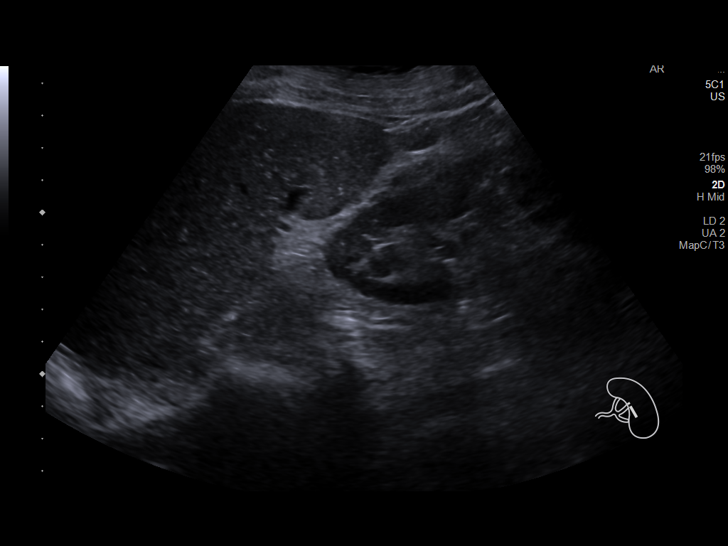
[im 43/85]
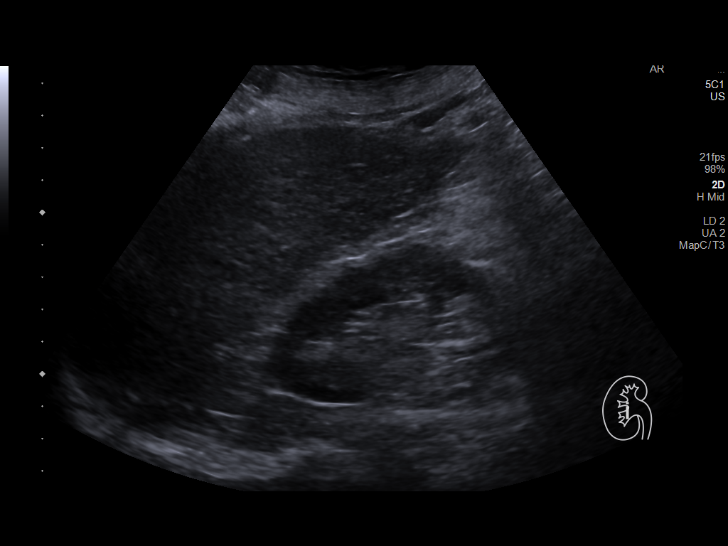
[im 50/85]
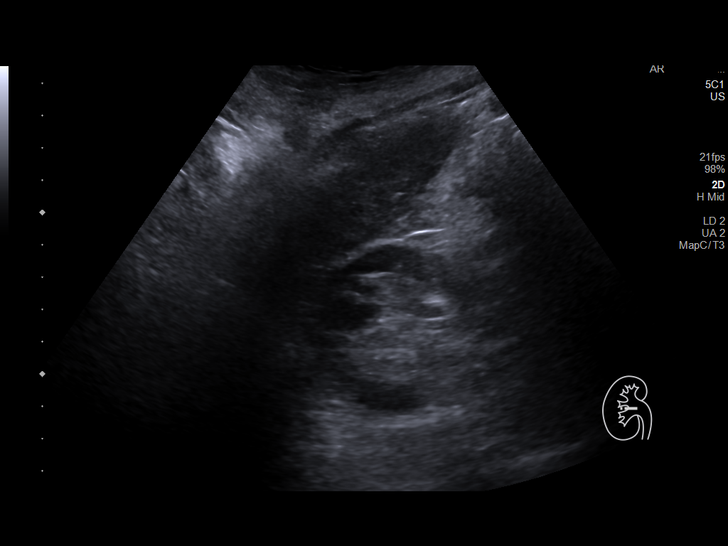
[im 57/85]
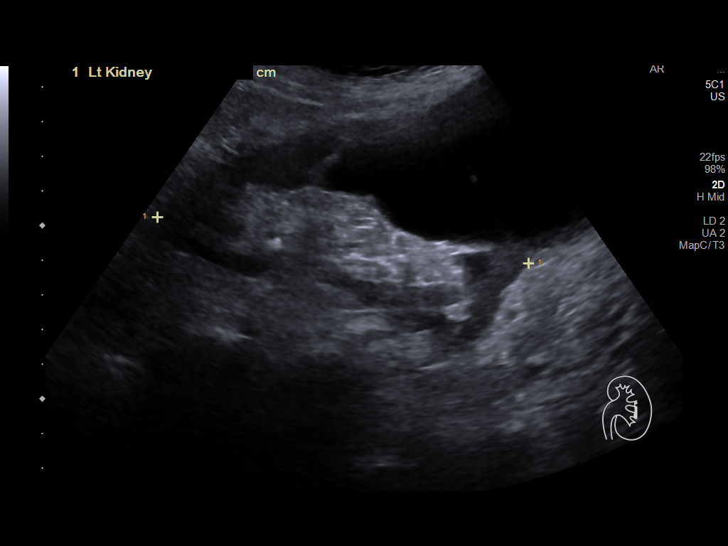
[im 64/85]
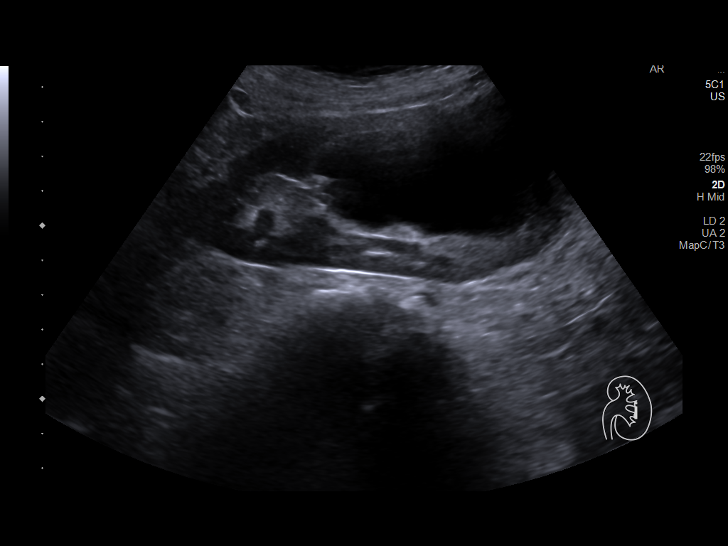
[im 71/85]
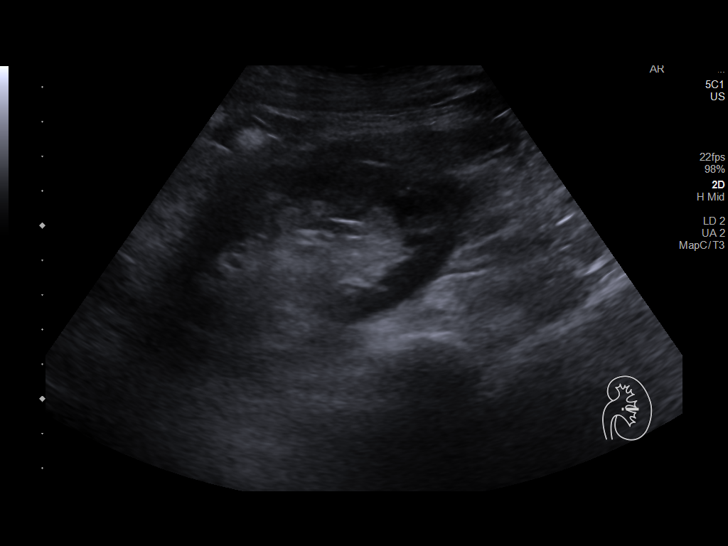
[im 78/85]
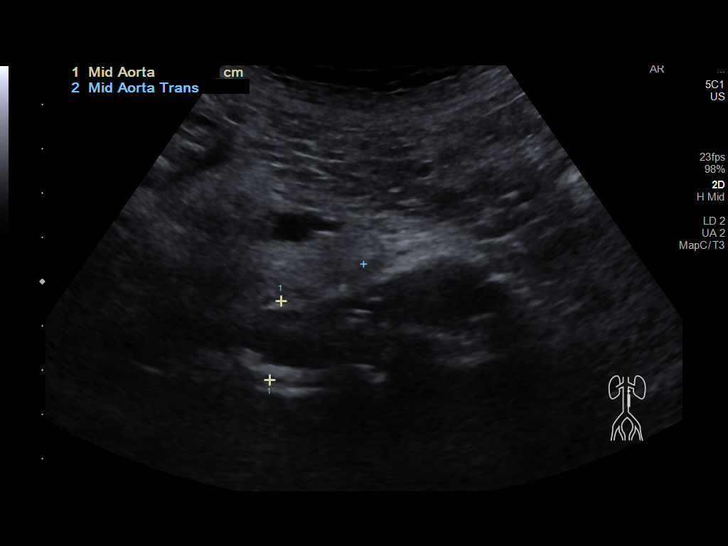
[im 85/85]
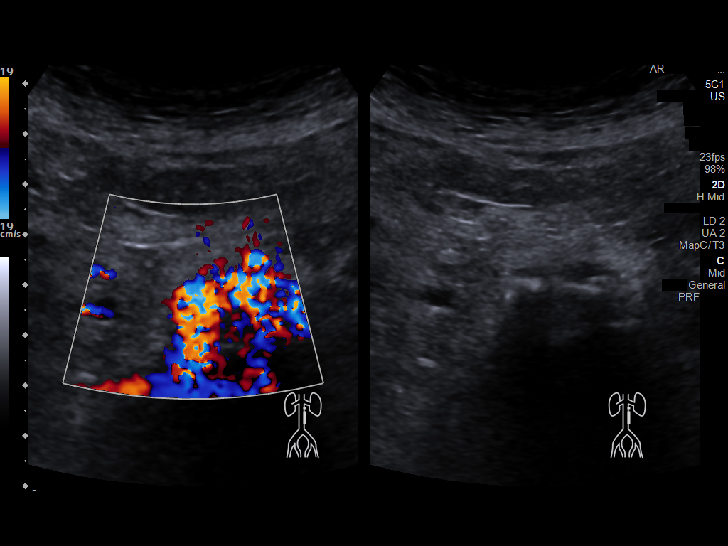

[13 of 25 positions shown; findings below may reference images not displayed]

FINDINGS: Gallbladder: Surgically absent.

Common bile duct: Diameter: 5 mm, within normal limits.

Liver: Diffusely coarsened hepatic echotexture and capsular
nodularity, consistent with hepatic cirrhosis. No liver mass
visualized. Portal vein is patent on color Doppler imaging with
normal direction of blood flow towards the liver.

IVC: No abnormality visualized.

Pancreas: Visualized portion unremarkable.

Spleen: Borderline splenomegaly, with splenic length of
approximately 13 cm.

Right Kidney: Length: 10.1 cm. Echogenicity within normal limits. No
mass or hydronephrosis visualized.

Left Kidney: Length: 10.8 cm. Echogenicity within normal limits. A
large is a cyst containing a few thin internal septations is seen in
the lower pole measuring 12.7 cm in maximum diameter. This remains
stable since previous study. No solid mass or hydronephrosis
visualized.

Abdominal aorta: No aneurysm visualized.

Other findings: No evidence of ascites.
IMPRESSION: Hepatic cirrhosis.  No hepatic mass visualized.

Borderline splenomegaly, suspicious for portal venous hypertension.
No evidence of ascites.

Prior cholecystectomy.  No evidence of biliary ductal dilatation.

Stable large mildly complicated left renal cyst. No evidence of
hydronephrosis.

## 2019-04-10 ENCOUNTER — Other Ambulatory Visit (INDEPENDENT_AMBULATORY_CARE_PROVIDER_SITE_OTHER): Payer: Self-pay | Admitting: *Deleted

## 2019-04-10 ENCOUNTER — Ambulatory Visit (INDEPENDENT_AMBULATORY_CARE_PROVIDER_SITE_OTHER): Payer: Medicare Other | Admitting: Internal Medicine

## 2019-04-10 ENCOUNTER — Encounter (INDEPENDENT_AMBULATORY_CARE_PROVIDER_SITE_OTHER): Payer: Self-pay | Admitting: Internal Medicine

## 2019-04-10 ENCOUNTER — Other Ambulatory Visit: Payer: Self-pay

## 2019-04-10 VITALS — BP 107/66 | HR 61 | Temp 97.4°F | Ht 63.0 in | Wt 134.1 lb

## 2019-04-10 DIAGNOSIS — D509 Iron deficiency anemia, unspecified: Secondary | ICD-10-CM | POA: Diagnosis not present

## 2019-04-10 DIAGNOSIS — K7460 Unspecified cirrhosis of liver: Secondary | ICD-10-CM | POA: Diagnosis not present

## 2019-04-10 DIAGNOSIS — K7581 Nonalcoholic steatohepatitis (NASH): Secondary | ICD-10-CM

## 2019-04-10 DIAGNOSIS — Z1211 Encounter for screening for malignant neoplasm of colon: Secondary | ICD-10-CM

## 2019-04-10 DIAGNOSIS — K7469 Other cirrhosis of liver: Secondary | ICD-10-CM

## 2019-04-10 DIAGNOSIS — Z800 Family history of malignant neoplasm of digestive organs: Secondary | ICD-10-CM

## 2019-04-10 DIAGNOSIS — D696 Thrombocytopenia, unspecified: Secondary | ICD-10-CM | POA: Diagnosis not present

## 2019-04-10 NOTE — Progress Notes (Signed)
Presenting complaint;  Follow-up for chronic liver disease.  Database and subjective:  Patient is 79 year old Caucasian female who has cirrhosis secondary to [place] who is here for scheduled visit.  She was last seen on 10/03/2018.  She states she is doing well.  She remains very active although she has had left knee issues and not walking as much.  She is watching her diet.  She feels she eats very hea lthy.  She has lost 9 pounds since her last visit.  He used to weigh over 180 pounds few years ago. She denies melena or rectal bleeding.  Her bowels move daily.  She also denies abdominal pain pruritus nausea or vomiting. She states she is having left knee pain.  She feels fluid has reaccumulated.  She had fluid removed by Dr. Manuella Ghazi about 2 weeks ago. She try to go back on p.o. iron but she developed diarrhea and stopped this medication.  She had lab studies via cancer clinic at [place] which are reviewed below. She says one of her sisters has cirrhosis.  She is 55 years old.  She has a niece daughter of her older sister who is 80 years old and has been listed for transplant at [facility] but presently on breathing support at hospital in [place] for Covid infection. She is up-to-date on hepatitis a and B vaccination. Family history significant for CRC in her mother who was in her 54s at the time of diagnosis and required APR.  She died 48 years later in her 31s.  Her last colonoscopy was in February 2016. Patient's last EGD was in January 2019 revealing grade 1 esophageal varices and portal hypertensive gastropathy.  Current Medications: Outpatient Encounter Medications as of 04/10/2019  Medication Sig  . acetaminophen (TYLENOL) 500 MG tablet Take 1,000 mg by mouth every 6 (six) hours as needed for moderate pain or headache.   Marland Kitchen aspirin 81 MG tablet Take 1 tablet (81 mg total) by mouth at bedtime.  Marland Kitchen atenolol (TENORMIN) 50 MG tablet Take 50 mg by mouth daily.    . Cholecalciferol (VITAMIN D3  PO) Take 2,000 Units by mouth daily.  . cyanocobalamin (,VITAMIN B-12,) 1000 MCG/ML injection Inject 1,000 mcg into the muscle every 30 (thirty) days.  . Cyanocobalamin (VITAMIN B 12 PO) Take 100 mg by mouth 2 (two) times daily. Takes 1 by mouth in the morning and 1 by mouth in the evening.  . dapagliflozin propanediol (FARXIGA) 10 MG TABS tablet Take 10 mg by mouth daily.   Marland Kitchen glimepiride (AMARYL) 4 MG tablet Take 4 mg by mouth daily.  Marland Kitchen lisinopril (PRINIVIL,ZESTRIL) 40 MG tablet Take 40 mg by mouth daily.  . metFORMIN (GLUCOPHAGE) 500 MG tablet Take 1,000 mg by mouth 2 (two) times daily with a meal.   . OVER THE COUNTER MEDICATION Patient states that she takes OTC Iron by mouth 1 time daily.  Marland Kitchen ZINC OXIDE EX Apply 1 application topically every other day.   No facility-administered encounter medications on file as of 04/10/2019.     Objective: Blood pressure 107/66, pulse 61, temperature (!) 97.4 F (36.3 C), temperature source Temporal, height 5' 3"  (1.6 m), weight 134 lb 1.6 oz (60.8 kg). Patient is alert and in no acute distress. She is wearing a facial mask. She does not have asterixis. Conjunctiva is pink. Sclera is nonicteric Oropharyngeal mucosa is normal. No neck masses or thyromegaly noted. Cardiac exam with regular rhythm normal S1 and S2. No murmur or gallop noted. Lungs  are clear to auscultation. Abdomen is symmetrical soft and nontender.  Spleen is nonpalpable.  Liver edge is indistinct below RCM. No LE edema or clubbing noted.  Labs/studies Results:  Ultrasound from 04/05/2019 reveals cirrhotic liver without hepatic masses or focal abnormalities.  Spleen is borderline in size measuring 13 cm and there is no ascites.  Lab data from 03/02/2019 WBC 3.6, H&H 11.9 and 35.5 and platelet count of 69K  BUN 19 creatinine 0.84 Glucose 129 Sodium 138, potassium 4.0, chloride 103, CO2 23 Serum calcium 9.8 Bilirubin 0.4, AP 65, AST 29, ALT 23, total protein 6.8 and albumin  4.1. Serum ferritin 24.   Serum ferritin was 25 on 01/22/2019.  Assessment:  #1.  Cirrhosis secondary to NAFLD.  She remains with well-preserved hepatic function.  Serum albumin is normal.  She does not have ascites.  Last EGD was over 2 years ago revealing grade 1 esophageal varices.  Would consider EGD at the time of next colonoscopy.  Ultrasound is negative for focal hepatic lesions.  She is due for alpha-fetoprotein.  #2.  History of iron deficiency anemia dating back to 2011.  She has required parenteral iron off and on.  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  She has not been able to tolerate p.o. iron.  #3.  Patient is high risk for CRC.  Her mother was diagnosed with rectal adenocarcinoma while in her 45s.  Patient's last colonoscopy was 5 years ago.  Plan:  Alpha-fetoprotein.  She can wait until her blood work in April 2021. Esophagogastroduodenoscopy to assess for esophageal varices along with high rescreening colonoscopy within the next few months. Office visit in 6 months.

## 2019-04-10 NOTE — Patient Instructions (Signed)
Alpha-fetoprotein to be done with next blood work in April 2021. Next ultrasound to be done in 6 months. We will plan for esophagogastroduodenoscopy and colonoscopy in May or June this year.

## 2019-04-25 DIAGNOSIS — E1165 Type 2 diabetes mellitus with hyperglycemia: Secondary | ICD-10-CM | POA: Diagnosis not present

## 2019-04-25 DIAGNOSIS — M7050 Other bursitis of knee, unspecified knee: Secondary | ICD-10-CM | POA: Diagnosis not present

## 2019-04-25 DIAGNOSIS — I10 Essential (primary) hypertension: Secondary | ICD-10-CM | POA: Diagnosis not present

## 2019-04-25 DIAGNOSIS — Z299 Encounter for prophylactic measures, unspecified: Secondary | ICD-10-CM | POA: Diagnosis not present

## 2019-04-25 DIAGNOSIS — M7052 Other bursitis of knee, left knee: Secondary | ICD-10-CM | POA: Diagnosis not present

## 2019-04-25 DIAGNOSIS — E1142 Type 2 diabetes mellitus with diabetic polyneuropathy: Secondary | ICD-10-CM | POA: Diagnosis not present

## 2019-05-02 ENCOUNTER — Other Ambulatory Visit: Payer: Self-pay

## 2019-05-02 ENCOUNTER — Encounter: Payer: Self-pay | Admitting: Orthopaedic Surgery

## 2019-05-02 ENCOUNTER — Ambulatory Visit (INDEPENDENT_AMBULATORY_CARE_PROVIDER_SITE_OTHER): Payer: Medicare Other | Admitting: Orthopaedic Surgery

## 2019-05-02 VITALS — BP 104/59 | HR 74 | Ht 63.0 in | Wt 131.0 lb

## 2019-05-02 DIAGNOSIS — G8929 Other chronic pain: Secondary | ICD-10-CM

## 2019-05-02 DIAGNOSIS — M25562 Pain in left knee: Secondary | ICD-10-CM | POA: Diagnosis not present

## 2019-05-02 NOTE — Progress Notes (Signed)
Subjective:    Patient ID: Jeanette Chang, female    DOB: 04-19-1940, 79 y.o.   MRN: [id number]  HPI She has several month history of pain of the left knee.  She has seen Dr. Manuella Ghazi.  I have reviewed his notes of 03-13-19, 03-29-19 and 04-25-19.  She has had aspiration of the left knee and injections, the most recent one last week.  She has no trauma no redness no giving way.  She has marked pain.  She can hardly walk.  She is not eating because of the pain.  She has taken pain medicine which did not help.  She says Tylenol works the best.  She has used ice and heat with no help.  Review of Systems  Constitutional: Positive for activity change.  Musculoskeletal: Positive for arthralgias, gait problem and joint swelling.  All other systems reviewed and are negative.  For Review of Systems, all other systems reviewed and are negative.  The following is a summary of the past history medically, past history surgically, known current medicines, social history and family history.  This information is gathered electronically by the computer from prior information and documentation.  I review this each visit and have found including this information at this point in the chart is beneficial and informative.   Past Medical History:  Diagnosis Date  . Abnormal LFTs   . Achilles bursitis or tendinitis   . Acute maxillary sinusitis   . Acute pharyngitis   . Anemia, unspecified   . Candidiasis of skin and nails   . Dermatophytosis of foot   . Dermatophytosis of the body   . Diverticulitis of colon (without mention of hemorrhage)(562.11)   . Edema   . Headache(784.0)   . Malaise and fatigue   . 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 Osteoarthrosis, unspecified whether generalized or localized, unspecified site   . Other dyspnea and respiratory abnormality   . Other psoriasis   . Other specified disorders of rotator cuff syndrome of shoulder and allied  disorders   . Pain in joint   . Pure hypercholesterolemia   . RUQ pain   . Spasm of back muscles   . Thrombocytopenia, unspecified ([place])   . Type II or unspecified type diabetes mellitus without mention of complication, not stated as uncontrolled   . Unspecified essential hypertension   . Unspecified sinusitis (chronic)   . Urinary incontinence     Past Surgical History:  Procedure Laterality Date  . APPENDECTOMY  1964  . BIOPSY  03/02/2017   Procedure: BIOPSY;  Surgeon: Rogene Houston, MD;  Location: [facility];  Service: Endoscopy;;  antral  . CAROTID ENDARTERECTOMY  2005   Left CEA  . CAROTID ENDARTERECTOMY  2009   Right CEA  . CHOLECYSTECTOMY  1982   Gall Bladder  . COLONOSCOPY  09  . ESOPHAGOGASTRODUODENOSCOPY N/A 03/02/2017   Procedure: ESOPHAGOGASTRODUODENOSCOPY (EGD);  Surgeon: Rogene Houston, MD;  Location: [facility];  Service: Endoscopy;  Laterality: N/A;  12:55  . NOSE SURGERY  1978  . PARATHYROIDECTOMY  1992  . POLYPECTOMY  03/02/2017   Procedure: POLYPECTOMY;  Surgeon: Rogene Houston, MD;  Location: [facility];  Service: Endoscopy;;  gastric   . Power port  03/06/2009   Right upper chest   . TONSILLECTOMY    . TUMOR EXCISION Right August 24, 2013   [facility] Dr. Ellen Henri, ENT  . VAGINAL HYSTERECTOMY  1972    Current Outpatient Medications on File Prior to Visit  Medication Sig Dispense Refill  . aspirin 81 MG tablet Take 1 tablet (81 mg total) by mouth at bedtime. 30 tablet   . atenolol (TENORMIN) 50 MG tablet Take 50 mg by mouth daily.      . Cholecalciferol (VITAMIN D3 PO) Take 2,000 Units by mouth daily.    . Cyanocobalamin (VITAMIN B 12 PO) Take 100 mg by mouth 2 (two) times daily. Takes 1 by mouth in the morning and 1 by mouth in the evening.    . dapagliflozin propanediol (FARXIGA) 10 MG TABS tablet Take 10 mg by mouth daily.     Marland Kitchen glimepiride (AMARYL) 4 MG tablet Take 4 mg by mouth daily.    Marland Kitchen lisinopril  (PRINIVIL,ZESTRIL) 40 MG tablet Take 40 mg by mouth daily.    . metFORMIN (GLUCOPHAGE) 500 MG tablet Take 1,000 mg by mouth 2 (two) times daily with a meal.     . OVER THE COUNTER MEDICATION Patient states that she takes OTC Iron by mouth 1 time daily.    Marland Kitchen ZINC OXIDE EX Apply 1 application topically every other day.    Marland Kitchen acetaminophen (TYLENOL) 500 MG tablet Take 1,000 mg by mouth every 6 (six) hours as needed for moderate pain or headache.      No current facility-administered medications on file prior to visit.    Social History   Socioeconomic History  . Marital status: Married    Spouse name: Not on file  . Number of children: Not on file  . Years of education: Not on file  . Highest education level: Not on file  Occupational History  . Not on file  Tobacco Use  . Smoking status: Former Smoker    Types: Cigarettes  . Smokeless tobacco: Former Systems developer    Quit date: 12/16/1992  Substance and Sexual Activity  . Alcohol use: Yes    Alcohol/week: 1.0 standard drinks    Types: 1 Glasses of wine per week    Comment: very seldom  . Drug use: No  . Sexual activity: Not on file  Other Topics Concern  . Not on file  Social History Narrative  . Not on file   Social Determinants of Health   Financial Resource Strain:   . Difficulty of Paying Living Expenses:   Food Insecurity:   . Worried About Charity fundraiser in the Last Year:   . Arboriculturist in the Last Year:   Transportation Needs:   . Film/video editor (Medical):   Marland Kitchen Lack of Transportation (Non-Medical):   Physical Activity:   . Days of Exercise per Week:   . Minutes of Exercise per Session:   Stress:   . Feeling of Stress :   Social Connections:   . Frequency of Communication with Friends and Family:   . Frequency of Social Gatherings with Friends and Family:   . Attends Religious Services:   . Active Member of Clubs or Organizations:   . Attends Archivist Meetings:   Marland Kitchen Marital Status:     Intimate Partner Violence:   . Fear of Current or Ex-Partner:   . Emotionally Abused:   Marland Kitchen Physically Abused:   . Sexually Abused:     Family History  Problem Relation Age of Onset  . Heart attack Mother   . Cancer Mother   . Heart attack Father   . Cancer Sister     BP Marland Kitchen)  104/59   Pulse 74   Ht 5' 3"  (1.6 m)   Wt 131 lb (59.4 kg)   BMI 23.21 kg/m   Body mass index is 23.21 kg/m.     Objective:   Physical Exam Vitals and nursing note reviewed.  Constitutional:      Appearance: She is well-developed.  HENT:     Head: Normocephalic and atraumatic.  Eyes:     Conjunctiva/sclera: Conjunctivae normal.     Pupils: Pupils are equal, round, and reactive to light.  Cardiovascular:     Rate and Rhythm: Normal rate and regular rhythm.  Pulmonary:     Effort: Pulmonary effort is normal.  Abdominal:     Palpations: Abdomen is soft.  Musculoskeletal:     Cervical back: Normal range of motion and neck supple.       Legs:  Skin:    General: Skin is warm and dry.  Neurological:     Mental Status: She is alert and oriented to person, place, and time.     Cranial Nerves: No cranial nerve deficit.     Motor: No abnormal muscle tone.     Coordination: Coordination normal.     Deep Tendon Reflexes: Reflexes are normal and symmetric. Reflexes normal.  Psychiatric:        Behavior: Behavior normal.        Thought Content: Thought content normal.        Judgment: Judgment normal.           Assessment & Plan:   Encounter Diagnosis  Name Primary?  . Chronic pain of left knee Yes   PROCEDURE NOTE:  The patient requests injections of the left knee , verbal consent was obtained.  The left knee was prepped appropriately after time out was performed.   Sterile technique was observed and injection of 1 cc of Depo-Medrol 40 mg with several cc's of plain xylocaine. Anesthesia was provided by ethyl chloride and a 20-gauge needle was used to inject the knee area. The  injection was tolerated well.  A band aid dressing was applied.  The patient was advised to apply ice later today and tomorrow to the injection sight as needed.  I will get MRI of the left knee.  Continue the Tylenol.  Return in two weeks.  Call if any problem.  Precautions discussed.   Electronically Signed Sanjuana Kava, MD 3/17/202110:05 AM

## 2019-05-11 DIAGNOSIS — M84462A Pathological fracture, left tibia, initial encounter for fracture: Secondary | ICD-10-CM | POA: Diagnosis not present

## 2019-05-11 DIAGNOSIS — S83232A Complex tear of medial meniscus, current injury, left knee, initial encounter: Secondary | ICD-10-CM | POA: Diagnosis not present

## 2019-05-11 DIAGNOSIS — M25562 Pain in left knee: Secondary | ICD-10-CM | POA: Diagnosis not present

## 2019-05-16 ENCOUNTER — Ambulatory Visit (INDEPENDENT_AMBULATORY_CARE_PROVIDER_SITE_OTHER): Payer: Medicare Other | Admitting: Orthopaedic Surgery

## 2019-05-16 ENCOUNTER — Other Ambulatory Visit: Payer: Self-pay

## 2019-05-16 ENCOUNTER — Encounter: Payer: Self-pay | Admitting: Orthopaedic Surgery

## 2019-05-16 DIAGNOSIS — M25562 Pain in left knee: Secondary | ICD-10-CM | POA: Diagnosis not present

## 2019-05-16 DIAGNOSIS — R011 Cardiac murmur, unspecified: Secondary | ICD-10-CM | POA: Diagnosis not present

## 2019-05-16 DIAGNOSIS — G8929 Other chronic pain: Secondary | ICD-10-CM | POA: Diagnosis not present

## 2019-05-16 DIAGNOSIS — M545 Low back pain: Secondary | ICD-10-CM | POA: Diagnosis not present

## 2019-05-16 NOTE — Progress Notes (Signed)
Patient Jeanette Chang, female DOB:1940/09/25, 79 y.o. GYK:[id number]  Chief Complaint  Patient presents with  . Knee Pain    L/still having pain but the shot helped tremendously    HPI  Jeanette Chang is a 79 y.o. female who has pain of the left knee.  She had injection last time which helped a lot.  She had MRI of the knee showing a complex tear of the entire medial meniscus with a flipped component.  I have explained the finding of the MRI.  I have explained about arthroscopy of the knee.  I have told her this is an elective procedure as an outpatient.  She would like to hold off on any surgery now as the knee is better after the injection. That is OK.  I will see her as needed.  Precautions given.    There is no height or weight on file to calculate BMI.  ROS  Review of Systems  All other systems reviewed and are negative.  The following is a summary of the past history medically, past history surgically, known current medicines, social history and family history.  This information is gathered electronically by the computer from prior information and documentation.  I review this each visit and have found including this information at this point in the chart is beneficial and informative.    Past Medical History:  Diagnosis Date  . Abnormal LFTs   . Achilles bursitis or tendinitis   . Acute maxillary sinusitis   . Acute pharyngitis   . Anemia, unspecified   . Candidiasis of skin and nails   . Dermatophytosis of foot   . Dermatophytosis of the body   . Diverticulitis of colon (without mention of hemorrhage)(562.11)   . Edema   . Headache(784.0)   . Malaise and fatigue   . 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 Osteoarthrosis, unspecified whether generalized or localized, unspecified site   . Other dyspnea and respiratory abnormality   . Other psoriasis   . Other specified disorders of rotator cuff syndrome of shoulder  and allied disorders   . Pain in joint   . Pure hypercholesterolemia   . RUQ pain   . Spasm of back muscles   . Thrombocytopenia, unspecified ([place])   . Type II or unspecified type diabetes mellitus without mention of complication, not stated as uncontrolled   . Unspecified essential hypertension   . Unspecified sinusitis (chronic)   . Urinary incontinence     Past Surgical History:  Procedure Laterality Date  . APPENDECTOMY  1964  . BIOPSY  03/02/2017   Procedure: BIOPSY;  Surgeon: Rogene Houston, MD;  [facility];  Service: Endoscopy;;  antral  . CAROTID ENDARTERECTOMY  2005   Left CEA  . CAROTID ENDARTERECTOMY  2009   Right CEA  . CHOLECYSTECTOMY  1982   Gall Bladder  . COLONOSCOPY  09  . ESOPHAGOGASTRODUODENOSCOPY N/A 03/02/2017   Procedure: ESOPHAGOGASTRODUODENOSCOPY (EGD);  Surgeon: Rogene Houston, MD;  [facility];  Service: Endoscopy;  Laterality: N/A;  12:55  . NOSE SURGERY  1978  . PARATHYROIDECTOMY  1992  . POLYPECTOMY  03/02/2017   Procedure: POLYPECTOMY;  Surgeon: Rogene Houston, MD;  [facility];  Service: Endoscopy;;  gastric   . Power port  03/06/2009   Right upper chest   . TONSILLECTOMY    . TUMOR EXCISION Right August 24, 2013   [facility] Dr.  Ellen Henri, ENT  . VAGINAL HYSTERECTOMY  1972    Family History  Problem Relation Age of Onset  . Heart attack Mother   . Cancer Mother   . Heart attack Father   . Cancer Sister     Social History Social History   Tobacco Use  . Smoking status: Former Smoker    Types: Cigarettes  . Smokeless tobacco: Former Systems developer    Quit date: 12/16/1992  Substance Use Topics  . Alcohol use: Yes    Alcohol/week: 1.0 standard drinks    Types: 1 Glasses of wine per week    Comment: very seldom  . Drug use: No    Allergies  Allergen Reactions  . Codeine Nausea And Vomiting and Rash  . Monascus Purpureus Went Yeast Other (See Comments)    Headaches, myalgias   . Niacin And Related Other (See Comments)    Headaches, myalgias  . Red Yeast Rice Other (See Comments)    Headaches, myalgias  . Actos [Pioglitazone Hydrochloride] Other (See Comments)    Severe headache  . Amaryl Nausea Only and Other (See Comments)    Severe headache  . Iron Diarrhea  . Sanctura [Trospium Chloride] Nausea Only and Other (See Comments)    Severe headache  . Statins Other (See Comments)    Severe headache,  Hips and leg weakness that cause patient not to be able to function as per patient.  Lisbeth Ply [Fesoterodine Fumarate] Other (See Comments)    Cramps, severe headache  . Penicillins Swelling, Rash and Other (See Comments)    Has patient had a PCN reaction causing immediate rash, facial/tongue/throat swelling, SOB or lightheadedness with hypotension: No Has patient had a PCN reaction causing severe rash involving mucus membranes or skin necrosis: No Has patient had a PCN reaction that required hospitalization: Yes Has patient had a PCN reaction occurring within the last 10 years: No If all of the above answers are "NO", then may proceed with Cephalosporin use.   Marland Kitchen Ultram [Tramadol Hcl] Nausea And Vomiting    Current Outpatient Medications  Medication Sig Dispense Refill  . acetaminophen (TYLENOL) 500 MG tablet Take 1,000 mg by mouth every 6 (six) hours as needed for moderate pain or headache.     Marland Kitchen aspirin 81 MG tablet Take 1 tablet (81 mg total) by mouth at bedtime. 30 tablet   . atenolol (TENORMIN) 50 MG tablet Take 50 mg by mouth daily.      . Cholecalciferol (VITAMIN D3 PO) Take 2,000 Units by mouth daily.    . Cyanocobalamin (VITAMIN B 12 PO) Take 100 mg by mouth 2 (two) times daily. Takes 1 by mouth in the morning and 1 by mouth in the evening.    . dapagliflozin propanediol (FARXIGA) 10 MG TABS tablet Take 10 mg by mouth daily.     Marland Kitchen glimepiride (AMARYL) 4 MG tablet Take 4 mg by mouth daily.    Marland Kitchen lisinopril (PRINIVIL,ZESTRIL) 40 MG tablet Take 40 mg by  mouth daily.    . metFORMIN (GLUCOPHAGE) 500 MG tablet Take 1,000 mg by mouth 2 (two) times daily with a meal.     . OVER THE COUNTER MEDICATION Patient states that she takes OTC Iron by mouth 1 time daily.    Marland Kitchen ZINC OXIDE EX Apply 1 application topically every other day.     No current facility-administered medications for this visit.     Physical Exam  There were no vitals taken for this visit.  Constitutional: overall  normal hygiene, normal nutrition, well developed, normal grooming, normal body habitus. Assistive device:none  Musculoskeletal: gait and station Limp left, muscle tone and strength are normal, no tremors or atrophy is present.  .  Neurological: coordination overall normal.  Deep tendon reflex/nerve stretch intact.  Sensation normal.  Cranial nerves II-XII intact.   Skin:   Normal overall no scars, lesions, ulcers or rashes. No psoriasis.  Psychiatric: Alert and oriented x 3.  Recent memory intact, remote memory unclear.  Normal mood and affect. Well groomed.  Good eye contact.  Cardiovascular: overall no swelling, no varicosities, no edema bilaterally, normal temperatures of the legs and arms, no clubbing, cyanosis and good capillary refill.  Lymphatic: palpation is normal. Left knee has slight effusion, crepitus, ROM 0 to 105, positive medial [place], [place] intact.  Limp left.  All other systems reviewed and are negative   The patient has been educated about the nature of the problem(s) and counseled on treatment options.  The patient appeared to understand what I have discussed and is in agreement with it.  Encounter Diagnosis  Name Primary?  . Chronic pain of left knee Yes    PLAN Call if any problems.  Precautions discussed.  Continue current medications.   Return to clinic prn   Electronically Signed Sanjuana Kava, MD 3/31/202110:15 AM

## 2019-05-21 ENCOUNTER — Encounter (INDEPENDENT_AMBULATORY_CARE_PROVIDER_SITE_OTHER): Payer: Self-pay | Admitting: *Deleted

## 2019-05-29 DIAGNOSIS — E538 Deficiency of other specified B group vitamins: Secondary | ICD-10-CM | POA: Diagnosis not present

## 2019-05-29 DIAGNOSIS — D696 Thrombocytopenia, unspecified: Secondary | ICD-10-CM | POA: Diagnosis not present

## 2019-05-29 DIAGNOSIS — D500 Iron deficiency anemia secondary to blood loss (chronic): Secondary | ICD-10-CM | POA: Diagnosis not present

## 2019-05-29 DIAGNOSIS — K7460 Unspecified cirrhosis of liver: Secondary | ICD-10-CM | POA: Diagnosis not present

## 2019-05-29 DIAGNOSIS — R5382 Chronic fatigue, unspecified: Secondary | ICD-10-CM | POA: Diagnosis not present

## 2019-05-29 DIAGNOSIS — K7581 Nonalcoholic steatohepatitis (NASH): Secondary | ICD-10-CM | POA: Diagnosis not present

## 2019-06-01 ENCOUNTER — Telehealth (INDEPENDENT_AMBULATORY_CARE_PROVIDER_SITE_OTHER): Payer: Self-pay | Admitting: *Deleted

## 2019-06-01 MED ORDER — PLENVU 140 G PO SOLR: 1.0000 | Freq: Once | ORAL | 0 refills | Status: AC

## 2019-06-01 NOTE — Telephone Encounter (Signed)
Patient needs Plenvu (copay card)

## 2019-06-05 DIAGNOSIS — D500 Iron deficiency anemia secondary to blood loss (chronic): Secondary | ICD-10-CM | POA: Diagnosis not present

## 2019-06-05 DIAGNOSIS — E538 Deficiency of other specified B group vitamins: Secondary | ICD-10-CM | POA: Diagnosis not present

## 2019-06-05 DIAGNOSIS — K7460 Unspecified cirrhosis of liver: Secondary | ICD-10-CM | POA: Diagnosis not present

## 2019-06-05 DIAGNOSIS — Z23 Encounter for immunization: Secondary | ICD-10-CM | POA: Diagnosis not present

## 2019-06-13 ENCOUNTER — Other Ambulatory Visit: Payer: Self-pay

## 2019-06-13 ENCOUNTER — Encounter: Payer: Self-pay | Admitting: Orthopaedic Surgery

## 2019-06-13 ENCOUNTER — Ambulatory Visit (INDEPENDENT_AMBULATORY_CARE_PROVIDER_SITE_OTHER): Payer: Medicare Other | Admitting: Orthopaedic Surgery

## 2019-06-13 VITALS — Ht 63.0 in | Wt 133.5 lb

## 2019-06-13 DIAGNOSIS — G8929 Other chronic pain: Secondary | ICD-10-CM

## 2019-06-13 DIAGNOSIS — M25562 Pain in left knee: Secondary | ICD-10-CM | POA: Diagnosis not present

## 2019-06-13 NOTE — Progress Notes (Signed)
My knee hurts, locks up, I cannot stand it any more.  She has known tear of the medial meniscus, complex tear with flipping of the meniscus by MRI.  She had refused surgery.  I did injection that helped some.  Now she has locking of the knee at times, more pain, more swelling, inability to walk at times.  She agrees to have surgery now.  She has walker but is not using it.  I will inject knee today to try to "calm it down" but definitive procedure needs to be done.  I will have her see Dr. Aline Brochure.  PROCEDURE NOTE:  The patient requests injections of the left knee , verbal consent was obtained.  The left knee was prepped appropriately after time out was performed.   Sterile technique was observed and injection of 1 cc of Depo-Medrol 40 mg with several cc's of plain xylocaine. Anesthesia was provided by ethyl chloride and a 20-gauge needle was used to inject the knee area. The injection was tolerated well.  A band aid dressing was applied.  The patient was advised to apply ice later today and tomorrow to the injection sight as needed.  Electronically Signed Sanjuana Kava, MD 4/28/202111:04 AM

## 2019-06-14 DIAGNOSIS — M1712 Unilateral primary osteoarthritis, left knee: Secondary | ICD-10-CM | POA: Diagnosis not present

## 2019-06-14 DIAGNOSIS — D473 Essential (hemorrhagic) thrombocythemia: Secondary | ICD-10-CM | POA: Diagnosis not present

## 2019-06-14 DIAGNOSIS — E1165 Type 2 diabetes mellitus with hyperglycemia: Secondary | ICD-10-CM | POA: Diagnosis not present

## 2019-06-14 DIAGNOSIS — I10 Essential (primary) hypertension: Secondary | ICD-10-CM | POA: Diagnosis not present

## 2019-06-14 DIAGNOSIS — E1142 Type 2 diabetes mellitus with diabetic polyneuropathy: Secondary | ICD-10-CM | POA: Diagnosis not present

## 2019-06-14 DIAGNOSIS — Z299 Encounter for prophylactic measures, unspecified: Secondary | ICD-10-CM | POA: Diagnosis not present

## 2019-06-15 ENCOUNTER — Other Ambulatory Visit: Payer: Self-pay

## 2019-06-15 ENCOUNTER — Ambulatory Visit (INDEPENDENT_AMBULATORY_CARE_PROVIDER_SITE_OTHER): Payer: Medicare Other | Admitting: Orthopedic Surgery

## 2019-06-15 ENCOUNTER — Encounter: Payer: Self-pay | Admitting: Orthopedic Surgery

## 2019-06-15 VITALS — BP 152/66 | HR 67 | Ht 63.0 in | Wt 132.0 lb

## 2019-06-15 DIAGNOSIS — M23322 Other meniscus derangements, posterior horn of medial meniscus, left knee: Secondary | ICD-10-CM | POA: Diagnosis not present

## 2019-06-15 DIAGNOSIS — G8929 Other chronic pain: Secondary | ICD-10-CM | POA: Insufficient documentation

## 2019-06-15 DIAGNOSIS — M25562 Pain in left knee: Secondary | ICD-10-CM | POA: Diagnosis not present

## 2019-06-15 NOTE — Progress Notes (Signed)
[name]  Chief Complaint  Patient presents with  . Knee Pain    left     Jeanette Chang presents to [place] at the request of Dr. Luna Glasgow for evaluation for possible surgery for torn medial meniscus and a stress fracture of the medial tibial plateau  Patient reports pain since October 2020 she has had the knee aspirated and injected on several occasions without relief.  She has a history of anemia cirrhosis and diabetes, scheduled for colonoscopy and endoscopy  She also has a history of cirrhosis of the liver  She complains of pain on the medial joint with a burning sensation over the proximal medial tibia  She has tried some activity modification but does not use the walker on the consistent basis  She had an injection on Wednesday and had increased pain she said within hours of the shot   Review of Systems  Constitutional: Positive for malaise/fatigue.  Respiratory: Negative for shortness of breath.   Cardiovascular: Negative for chest pain.  Endo/Heme/Allergies: Does not bruise/bleed easily.  All other systems reviewed and are negative.    Past Medical History:  Diagnosis Date  . Abnormal LFTs   . Achilles bursitis or tendinitis   . Acute maxillary sinusitis   . Acute pharyngitis   . Anemia, unspecified   . Candidiasis of skin and nails   . Dermatophytosis of foot   . Dermatophytosis of the body   . Diverticulitis of colon (without mention of hemorrhage)(562.11)   . Edema   . Headache(784.0)   . Malaise and fatigue   . 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 Osteoarthrosis, unspecified whether generalized or localized, unspecified site   . Other dyspnea and respiratory abnormality   . Other psoriasis   . Other specified disorders of rotator cuff syndrome of shoulder and allied disorders   . Pain in joint   . Pure hypercholesterolemia   . RUQ pain   . Spasm of back muscles   . Thrombocytopenia, unspecified ([place])   . Type II  or unspecified type diabetes mellitus without mention of complication, not stated as uncontrolled   . Unspecified essential hypertension   . Unspecified sinusitis (chronic)   . Urinary incontinence     Past Surgical History:  Procedure Laterality Date  . APPENDECTOMY  1964  . BIOPSY  03/02/2017   Procedure: BIOPSY;  Surgeon: Rogene Houston, MD;  [facility];  Service: Endoscopy;;  antral  . CAROTID ENDARTERECTOMY  2005   Left CEA  . CAROTID ENDARTERECTOMY  2009   Right CEA  . CHOLECYSTECTOMY  1982   Gall Bladder  . COLONOSCOPY  09  . ESOPHAGOGASTRODUODENOSCOPY N/A 03/02/2017   Procedure: ESOPHAGOGASTRODUODENOSCOPY (EGD);  Surgeon: Rogene Houston, MD;  [facility];  Service: Endoscopy;  Laterality: N/A;  12:55  . NOSE SURGERY  1978  . PARATHYROIDECTOMY  1992  . POLYPECTOMY  03/02/2017   Procedure: POLYPECTOMY;  Surgeon: Rogene Houston, MD;  [facility];  Service: Endoscopy;;  gastric   . Power port  03/06/2009   Right upper chest   . TONSILLECTOMY    . TUMOR EXCISION Right August 24, 2013   [facility] Dr. Ellen Henri, ENT  . VAGINAL HYSTERECTOMY  1972    Family History  Problem Relation Age of Onset  . Heart attack Mother   . Cancer Mother   . Heart attack Father   . Cancer Sister  Social History   Tobacco Use  . Smoking status: Former Smoker    Types: Cigarettes  . Smokeless tobacco: Former Systems developer    Quit date: 12/16/1992  Substance Use Topics  . Alcohol use: Yes    Alcohol/week: 1.0 standard drinks    Types: 1 Glasses of wine per week    Comment: very seldom  . Drug use: No    Allergies  Allergen Reactions  . Codeine Nausea And Vomiting and Rash  . Monascus Purpureus Went Yeast Other (See Comments)    Headaches, myalgias  . Niacin And Related Other (See Comments)    Headaches, myalgias  . Red Yeast Rice Other (See Comments)    Headaches, myalgias  . Actos [Pioglitazone Hydrochloride] Other (See  Comments)    Severe headache  . Amaryl Nausea Only and Other (See Comments)    Severe headache  . Iron Diarrhea  . Sanctura [Trospium Chloride] Nausea Only and Other (See Comments)    Severe headache  . Statins Other (See Comments)    Severe headache,  Hips and leg weakness that cause patient not to be able to function as per patient.  Lisbeth Ply [Fesoterodine Fumarate] Other (See Comments)    Cramps, severe headache  . Norco [Hydrocodone-Acetaminophen] Nausea And Vomiting and Anxiety  . Penicillins Swelling, Rash and Other (See Comments)    Has patient had a PCN reaction causing immediate rash, facial/tongue/throat swelling, SOB or lightheadedness with hypotension: No Has patient had a PCN reaction causing severe rash involving mucus membranes or skin necrosis: No Has patient had a PCN reaction that required hospitalization: Yes Has patient had a PCN reaction occurring within the last 10 years: No If all of the above answers are "NO", then may proceed with Cephalosporin use.   Marland Kitchen Ultram [Tramadol Hcl] Nausea And Vomiting     Current Meds  Medication Sig  . acetaminophen (TYLENOL) 500 MG tablet Take 1,000 mg by mouth every 6 (six) hours as needed for moderate pain or headache.   Marland Kitchen aspirin EC 81 MG tablet Take 81 mg by mouth every evening.  Marland Kitchen atenolol (TENORMIN) 50 MG tablet Take 50 mg by mouth at bedtime.   . Cholecalciferol (VITAMIN D3) 50 MCG (2000 UT) TABS Take 2,000 Units by mouth every evening.  . cyanocobalamin (,VITAMIN B-12,) 1000 MCG/ML injection Inject 1,000 mcg into the muscle every 30 (thirty) days.  . dapagliflozin propanediol (FARXIGA) 10 MG TABS tablet Take 10 mg by mouth daily.   Marland Kitchen glimepiride (AMARYL) 2 MG tablet Take 2 mg by mouth daily.  Marland Kitchen lisinopril (PRINIVIL,ZESTRIL) 40 MG tablet Take 40 mg by mouth daily.  . metFORMIN (GLUCOPHAGE) 500 MG tablet Take 1,000 mg by mouth 2 (two) times daily with a meal.   . vitamin B-12 (CYANOCOBALAMIN) 500 MCG tablet Take 500 mcg by  mouth daily.  Marland Kitchen ZINC OXIDE EX Apply 1 application topically 3 (three) times daily as needed (psoriasis flares).     BP (!) 152/66   Pulse 67   Ht 5' 3"  (1.6 m)   Wt 132 lb (59.9 kg)   BMI 23.38 kg/m   Physical Exam She walked in today with no assistive devices her mood is pleasant her affect is normal she is oriented x3 her parents showed no gross deformities she was well groomed Ortho Exam She had tenderness over the proximal medial tibia there is no joint effusion range of motion arc was 125 degrees her knee was stable in all planes and muscle strength and tone  was normal skin was intact pulses were good distally there was no edema and sensation was normal   MEDICAL DECISION SECTION  xrays ordered?  No  My independent reading of xrays:   X-rays were done at her primary care physician's office she has symmetric joint space narrowing without any secondary bone changes I would grade this is mild arthritis  She also had an MRI which is in the medical record and that shows that she does have a torn meniscus  She has a large stress reaction of the medial tibial plateau on the lateral MRI involves the the entire plateau from front to back she also has a complex tear of the medial meniscus with a flipped fragment   Encounter Diagnoses  Name Primary?  . Chronic pain of left knee Yes  . 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 Brigitte Pulse  Preop assessments: Having colonoscopy soon   Risk of surgery: Bleeding infection risk of amputation DVT pulmonary embolus stiffness continued postop pain  Alternative treatments: Continue with nonoperative care such as but not limited to Tylenol anti-inflammatories tramadol topical medications bracing chronic pain management  Patient factors that increase risk: Diabetes and cirrhosis anemia   Pain management: Medication will be tapered off at 6 weeks an alternative measures will be needed  to control pain  Postoperatively:   Surgical procedure planned: She is agreeable to arthroscopy of the left knee and partial medial meniscectomy and then a brace and walker for at least 8 weeks she is advised to use a walker until I tell her she cannot use it or does not need it anymore starting today  The procedure has been fully reviewed with the patient; The risks and benefits of surgery have been discussed and explained and understood. Alternative treatment has also been reviewed, questions were encouraged and answered. The postoperative plan is also been reviewed.  Nonsurgical treatment as described in the history and physical section was attempted and unsuccessful and the patient has agreed to proceed with surgical intervention to improve their situation.  No orders of the defined types were placed in this encounter.   Arther Abbott, MD 06/15/2019 10:04 AM

## 2019-06-15 NOTE — Patient Instructions (Signed)
Use the walker until Dr Aline Brochure says its ok to stop   Surgery scheduled for the 25th of May   Meniscus Injury, Arthroscopy   Arthroscopy is a surgical procedure that involves the use of a small scope that has a camera and surgical instruments on the end (arthroscope). An arthroscope can be used to repair your meniscus injury.  LET [facility] CARE PROVIDER KNOW ABOUT:  Any allergies you have.  All medicines you are taking, including vitamins, herbs, eyedrops, creams, and over-the-counter medicines.  Any recent colds or infections you have had or currently have.  Previous problems you or members of your family have had with the use of anesthetics.  Any blood disorders or blood clotting problems you have.  Previous surgeries you have had.  Medical conditions you have. RISKS AND COMPLICATIONS Generally, this is a safe procedure. However, as with any procedure, problems can occur. Possible problems include:  Damage to nerves or blood vessels.  Excess bleeding.  Blood clots.  Infection. BEFORE THE PROCEDURE  Do not eat or drink for 6-8 hours before the procedure.  Take medicines as directed by your surgeon. Ask your surgeon about changing or stopping your regular medicines.  You may have lab tests the morning of surgery. PROCEDURE  You will be given one of the following:   A medicine that numbs the area (local anesthesia).  A medicine that makes you go to sleep (general anesthesia).  A medicine injected into your spine that numbs your body below the waist (spinal anesthesia). Most often, several small cuts (incisions) are made in the knee. The arthroscope and instruments go into the incisions to repair the damage. The torn portion of the meniscus is removed.   AFTER THE PROCEDURE  You will be taken to the recovery area where your progress will be monitored. When you are awake, stable, and taking fluids without complications, you will be allowed to go home. This is  usually the same day. A torn or stretched ligament (ligament sprain) may take 6-8 weeks to heal.   It takes about the 4-6 WEEKS if your surgeon removed a torn meniscus.  A repaired meniscus may require 6-12 weeks of recovery time.  A torn ligament needing reconstructive surgery may take 6-12 months to heal fully.   This information is not intended to replace advice given to you by your health care provider. Make sure you discuss any questions you have with your health care provider. You have decided to proceed with operative arthroscopy of the knee. You have decided not to continue with nonoperative measures such as but not limited to oral medication, weight loss, activity modification, physical therapy, bracing, or injection.  We will perform operative arthroscopy of the knee. Some of the risks associated with arthroscopic surgery of the knee include but are not limited to Bleeding Infection Swelling Stiffness Blood clot Pain Need for knee replacement surgery    In compliance with recent [place] law in federal regulation regarding opioid use and abuse and addiction, we will taper (stop) opioid medication after 2 weeks.  If you're not comfortable with these risks and would like to continue with nonoperative treatment please let Dr. Aline Brochure know prior to your surgery.

## 2019-06-25 ENCOUNTER — Other Ambulatory Visit (HOSPITAL_COMMUNITY)
Admission: RE | Admit: 2019-06-25 | Discharge: 2019-06-25 | Disposition: A | Discharge status: Home / Self Care | Payer: Medicare Other | Source: Ambulatory Visit | Attending: Internal Medicine | Admitting: Internal Medicine

## 2019-06-25 ENCOUNTER — Other Ambulatory Visit: Payer: Self-pay

## 2019-06-25 DIAGNOSIS — Z01812 Encounter for preprocedural laboratory examination: Secondary | ICD-10-CM | POA: Diagnosis not present

## 2019-06-25 DIAGNOSIS — Z20822 Contact with and (suspected) exposure to covid-19: Secondary | ICD-10-CM | POA: Diagnosis not present

## 2019-06-26 LAB — SARS CORONAVIRUS 2 (TAT 6-24 HRS): SARS Coronavirus 2: NEGATIVE

## 2019-06-27 ENCOUNTER — Other Ambulatory Visit: Payer: Self-pay

## 2019-06-27 ENCOUNTER — Encounter (HOSPITAL_COMMUNITY): Payer: Self-pay | Admitting: Internal Medicine

## 2019-06-27 ENCOUNTER — Encounter (HOSPITAL_COMMUNITY)
Admission: RE | Disposition: A | Discharge status: Home / Self Care | Payer: Self-pay | Source: Home / Self Care | Attending: Internal Medicine

## 2019-06-27 ENCOUNTER — Ambulatory Visit (HOSPITAL_COMMUNITY)
Admission: RE | Admit: 2019-06-27 | Discharge: 2019-06-27 | Disposition: A | Discharge status: Home / Self Care | Payer: Medicare Other | Attending: Internal Medicine | Admitting: Internal Medicine

## 2019-06-27 DIAGNOSIS — Z13810 Encounter for screening for upper gastrointestinal disorder: Secondary | ICD-10-CM | POA: Diagnosis not present

## 2019-06-27 DIAGNOSIS — Z87891 Personal history of nicotine dependence: Secondary | ICD-10-CM | POA: Insufficient documentation

## 2019-06-27 DIAGNOSIS — Z1211 Encounter for screening for malignant neoplasm of colon: Secondary | ICD-10-CM | POA: Insufficient documentation

## 2019-06-27 DIAGNOSIS — K7581 Nonalcoholic steatohepatitis (NASH): Secondary | ICD-10-CM

## 2019-06-27 DIAGNOSIS — K3189 Other diseases of stomach and duodenum: Secondary | ICD-10-CM | POA: Insufficient documentation

## 2019-06-27 DIAGNOSIS — Z79899 Other long term (current) drug therapy: Secondary | ICD-10-CM | POA: Diagnosis not present

## 2019-06-27 DIAGNOSIS — K6389 Other specified diseases of intestine: Secondary | ICD-10-CM | POA: Diagnosis not present

## 2019-06-27 DIAGNOSIS — K644 Residual hemorrhoidal skin tags: Secondary | ICD-10-CM | POA: Insufficient documentation

## 2019-06-27 DIAGNOSIS — E119 Type 2 diabetes mellitus without complications: Secondary | ICD-10-CM | POA: Diagnosis not present

## 2019-06-27 DIAGNOSIS — K766 Portal hypertension: Secondary | ICD-10-CM | POA: Insufficient documentation

## 2019-06-27 DIAGNOSIS — D123 Benign neoplasm of transverse colon: Secondary | ICD-10-CM | POA: Insufficient documentation

## 2019-06-27 DIAGNOSIS — I8510 Secondary esophageal varices without bleeding: Secondary | ICD-10-CM | POA: Diagnosis not present

## 2019-06-27 DIAGNOSIS — Z7982 Long term (current) use of aspirin: Secondary | ICD-10-CM | POA: Insufficient documentation

## 2019-06-27 DIAGNOSIS — I10 Essential (primary) hypertension: Secondary | ICD-10-CM | POA: Insufficient documentation

## 2019-06-27 DIAGNOSIS — Z800 Family history of malignant neoplasm of digestive organs: Secondary | ICD-10-CM | POA: Diagnosis not present

## 2019-06-27 DIAGNOSIS — K7460 Unspecified cirrhosis of liver: Secondary | ICD-10-CM | POA: Diagnosis not present

## 2019-06-27 DIAGNOSIS — I6529 Occlusion and stenosis of unspecified carotid artery: Secondary | ICD-10-CM | POA: Diagnosis not present

## 2019-06-27 DIAGNOSIS — D120 Benign neoplasm of cecum: Secondary | ICD-10-CM | POA: Diagnosis not present

## 2019-06-27 DIAGNOSIS — K7469 Other cirrhosis of liver: Secondary | ICD-10-CM

## 2019-06-27 DIAGNOSIS — Z7984 Long term (current) use of oral hypoglycemic drugs: Secondary | ICD-10-CM | POA: Diagnosis not present

## 2019-06-27 HISTORY — PX: COLONOSCOPY: SHX5424

## 2019-06-27 HISTORY — PX: POLYPECTOMY: SHX5525

## 2019-06-27 HISTORY — PX: ESOPHAGOGASTRODUODENOSCOPY: SHX5428

## 2019-06-27 LAB — GLUCOSE, CAPILLARY
Glucose-Capillary: 271 mg/dL — ABNORMAL HIGH (ref 70–99)
Glucose-Capillary: 202 mg/dL — ABNORMAL HIGH (ref 70–99)

## 2019-06-27 SURGERY — EGD (ESOPHAGOGASTRODUODENOSCOPY)
Anesthesia: Moderate Sedation

## 2019-06-27 MED ORDER — MIDAZOLAM HCL 5 MG/5ML IJ SOLN
INTRAMUSCULAR | Status: AC
  Filled 2019-06-27: qty 10

## 2019-06-27 MED ORDER — MEPERIDINE HCL 50 MG/ML IJ SOLN
INTRAMUSCULAR | Status: AC
  Filled 2019-06-27: qty 1

## 2019-06-27 MED ORDER — MIDAZOLAM HCL 5 MG/5ML IJ SOLN
INTRAMUSCULAR | Status: DC | PRN
  Administered 2019-06-27: 2 mg via INTRAVENOUS
  Administered 2019-06-27 (×2): 1 mg via INTRAVENOUS

## 2019-06-27 MED ORDER — LIDOCAINE VISCOUS HCL 2 % MT SOLN
OROMUCOSAL | Status: AC
  Filled 2019-06-27: qty 15

## 2019-06-27 MED ORDER — LIDOCAINE VISCOUS HCL 2 % MT SOLN
OROMUCOSAL | Status: DC | PRN
  Administered 2019-06-27: 4 mL via OROMUCOSAL

## 2019-06-27 MED ORDER — SODIUM CHLORIDE 0.9 % IV SOLN: INTRAVENOUS | Status: DC

## 2019-06-27 MED ORDER — STERILE WATER FOR IRRIGATION IR SOLN
Status: DC | PRN
  Administered 2019-06-27: 1.5 mL

## 2019-06-27 MED ORDER — MEPERIDINE HCL 50 MG/ML IJ SOLN
INTRAMUSCULAR | Status: DC | PRN
  Administered 2019-06-27: 15 mg via INTRAVENOUS
  Administered 2019-06-27: 20 mg via INTRAVENOUS

## 2019-06-27 NOTE — Op Note (Addendum)
[facility] Patient Name: Jeanette Chang Procedure Date: 06/27/2019 7:11 AM MRN: [id number] Date of Birth: 06/03/1940 Attending MD: Hildred Laser , MD CSN: [id number] Age: 79 Admit Type: Outpatient Procedure:                Upper GI endoscopy Indications:              Cirrhosis with suspected esophageal varices Providers:                Hildred Laser, MD, Otis Peak B. Sharon Seller, RN, Raphael Gibney, Technician Referring MD:             Fuller Canada Manuella Ghazi, MD Medicines:                Lidocaine spray, Meperidine 20 mg IV, Midazolam 3                            mg IV Complications:            No immediate complications. Estimated Blood Loss:     Estimated blood loss: none. Procedure:                Pre-Anesthesia Assessment:                           - Prior to the procedure, a History and Physical                            was performed, and patient medications and                            allergies were reviewed. The patient's tolerance of                            previous anesthesia was also reviewed. The risks                            and benefits of the procedure and the sedation                            options and risks were discussed with the patient.                            All questions were answered, and informed consent                            was obtained. Prior Anticoagulants: The patient has                            taken no previous anticoagulant or antiplatelet                            agents except for aspirin. ASA Grade Assessment:  III - A patient with severe systemic disease. After                            reviewing the risks and benefits, the patient was                            deemed in satisfactory condition to undergo the                            procedure.                           After obtaining informed consent, the endoscope was                            passed under direct vision.  Throughout the                            procedure, the patient's blood pressure, pulse, and                            oxygen saturations were monitored continuously. The                            GIF-H190 ([id number]) scope was introduced through the                            mouth, and advanced to the second part of duodenum.                            The upper GI endoscopy was accomplished without                            difficulty. The patient tolerated the procedure                            well. Scope In: 7:38:35 AM Scope Out: 7:42:11 AM Total Procedure Duration: 0 hours 3 minutes 36 seconds  Findings:      The hypopharynx was normal.      The proximal esophagus was normal.      Grade I varices were found in the distal esophagus.      The Z-line was regular and was found 36 cm from the incisors.      Mild portal hypertensive gastropathy was found in the gastric fundus.      The exam of the stomach was otherwise normal.      The duodenal bulb and second portion of the duodenum were normal. Impression:               - Normal hypopharynx.                           - Normal proximal esophagus.                           - Grade I esophageal varices. Too small to  be                            banded.                           - Z-line regular, 36 cm from the incisors.                           - Portal hypertensive gastropathy.                           - Normal duodenal bulb and second portion of the                            duodenum.                           - No specimens collected. Moderate Sedation:      Moderate (conscious) sedation was administered by the endoscopy nurse       and supervised by the endoscopist. The following parameters were       monitored: oxygen saturation, heart rate, blood pressure, CO2       capnography and response to care. Total physician intraservice time was       7 minutes. Recommendation:           - Patient has a contact number available  for                            emergencies. The signs and symptoms of potential                            delayed complications were discussed with the                            patient. Return to normal activities tomorrow.                            Written discharge instructions were provided to the                            patient.                           - Resume previous diet today.                           - Continue present medications.                           - Repeat upper endoscopy in 1 year. Procedure Code(s):        --- Professional ---                           [phone], Esophagogastroduodenoscopy, flexible,                            transoral;  diagnostic, including collection of                            specimen(s) by brushing or washing, when performed                            (separate procedure) Diagnosis Code(s):        --- Professional ---                           K74.60, Unspecified cirrhosis of liver                           I85.10, Secondary esophageal varices without                            bleeding                           K76.6, Portal hypertension                           K31.89, Other diseases of stomach and duodenum CPT copyright 2019 American Medical Association. All rights reserved. The codes documented in this report are preliminary and upon coder review may  be revised to meet current compliance requirements. Hildred Laser, MD Hildred Laser, MD 06/27/2019 8:21:54 AM This report has been signed electronically. Number of Addenda: 0

## 2019-06-27 NOTE — Op Note (Addendum)
[facility] Patient Name: Jeanette Chang Procedure Date: 06/27/2019 7:45 AM MRN: [id number] Date of Birth: Jul 26, 1940 Attending MD: Hildred Laser , MD CSN: [id number] Age: 79 Admit Type: Inpatient Procedure:                Colonoscopy Indications:              Screening for colorectal malignant neoplasm,                            Screening in patient at increased risk: Colorectal                            cancer in mother before age 27 Providers:                Hildred Laser, MD, Otis Peak B. Sharon Seller, RN, Raphael Gibney, Technician Referring MD:             Fuller Canada Manuella Ghazi, MD Medicines:                Meperidine 15 mg IV, Midazolam 1 mg IV Complications:            No immediate complications. Estimated Blood Loss:     Estimated blood loss was minimal. Procedure:                Pre-Anesthesia Assessment:                           - Prior to the procedure, a History and Physical                            was performed, and patient medications and                            allergies were reviewed. The patient's tolerance of                            previous anesthesia was also reviewed. The risks                            and benefits of the procedure and the sedation                            options and risks were discussed with the patient.                            All questions were answered, and informed consent                            was obtained. Prior Anticoagulants: The patient has                            taken no previous anticoagulant or antiplatelet  agents except for aspirin. ASA Grade Assessment:                            III - A patient with severe systemic disease. After                            reviewing the risks and benefits, the patient was                            deemed in satisfactory condition to undergo the                            procedure.                           After obtaining  informed consent, the colonoscope                            was passed under direct vision. Throughout the                            procedure, the patient's blood pressure, pulse, and                            oxygen saturations were monitored continuously. The                            PCF-H190DL ([id number]) scope was introduced through                            the anus and advanced to the the cecum, identified                            by appendiceal orifice and ileocecal valve. The                            colonoscopy was performed without difficulty. The                            patient tolerated the procedure well. The quality                            of the bowel preparation was good. The ileocecal                            valve, appendiceal orifice, and rectum were                            photographed. Scope In: 7:47:48 AM Scope Out: 8:10:28 AM Scope Withdrawal Time: 0 hours 15 minutes 23 seconds  Total Procedure Duration: 0 hours 22 minutes 40 seconds  Findings:      Skin tags were found on perianal exam.      The digital rectal exam was normal.      Two sessile polyps  were found in the splenic flexure and cecum. The       polyps were small in size. These were biopsied with a cold forceps for       histology. The pathology specimen was placed into Bottle Number 1.      A 6 mm polyp was found in the cecum. The polyp was sessile. The polyp       was removed with a cold snare. Resection and retrieval were complete.       The pathology specimen was placed into Bottle Number 1.      An area of mildly congested mucosa was found in the entire colon.      External hemorrhoids were found during retroflexion. The hemorrhoids       were small. Impression:               - Perianal skin tags found on perianal exam.                           - Two small polyps at the splenic flexure and in                            the cecum. Biopsied.                           - One 6 mm  polyp in the cecum, removed with a cold                            snare. Resected and retrieved.                           - Congested mucosa in the entire examined colon.                           - External hemorrhoids. Moderate Sedation:      Moderate (conscious) sedation was administered by the endoscopy nurse       and supervised by the endoscopist. The following parameters were       monitored: oxygen saturation, heart rate, blood pressure, CO2       capnography and response to care. Total physician intraservice time was       28 minutes. Recommendation:           - Patient has a contact number available for                            emergencies. The signs and symptoms of potential                            delayed complications were discussed with the                            patient. Return to normal activities tomorrow.                            Written discharge instructions were provided to the  patient.                           - Resume previous diet today.                           - Continue present medications.                           - No aspirin, ibuprofen, naproxen, or other                            non-steroidal anti-inflammatory drugs for 1 day.                           - Await pathology results.                           - No recommendation at this time regarding repeat                            colonoscopy. Procedure Code(s):        --- Professional ---                           [phone], Colonoscopy, flexible; with removal of                            tumor(s), polyp(s), or other lesion(s) by snare                            technique                           45380, 59, Colonoscopy, flexible; with biopsy,                            single or multiple                           99153, Moderate sedation; each additional 15                            minutes intraservice time                           G0500, Moderate sedation  services provided by the                            same physician or other qualified health care                            professional performing a gastrointestinal                            endoscopic service that sedation supports,  requiring the presence of an independent trained                            observer to assist in the monitoring of the                            patient's level of consciousness and physiological                            status; initial 15 minutes of intra-service time;                            patient age 50 years or older (additional time may                            be reported with [phone], as appropriate) Diagnosis Code(s):        --- Professional ---                           K63.5, Polyp of colon                           K64.4, Residual hemorrhoidal skin tags                           Z12.11, Encounter for screening for malignant                            neoplasm of colon                           Z80.0, Family history of malignant neoplasm of                            digestive organs                           K63.89, Other specified diseases of intestine CPT copyright 2019 American Medical Association. All rights reserved. The codes documented in this report are preliminary and upon coder review may  be revised to meet current compliance requirements. Hildred Laser, MD Hildred Laser, MD 06/27/2019 8:27:23 AM This report has been signed electronically. Number of Addenda: 0

## 2019-06-27 NOTE — Discharge Instructions (Signed)
Resume aspirin on 06/28/2019 Resume other medications as before. Resume usual diet. No driving for 24 hours. Physician will call with biopsy results.   Colonoscopy, Adult, Care After This sheet gives you information about how to care for yourself after your procedure. Your doctor may also give you more specific instructions. If you have problems or questions, call your doctor. What can I expect after the procedure? After the procedure, it is common to have:  A small amount of blood in your poop (stool) for 24 hours.  Some gas.  Mild cramping or bloating in your belly (abdomen). Follow these instructions at home: Eating and drinking   Drink enough fluid to keep your pee (urine) pale yellow.  Follow instructions from your doctor about what you cannot eat or drink.  Return to your normal diet as told by your doctor. Avoid heavy or fried foods that are hard to digest. Activity  Rest as told by your doctor.  Do not sit for a long time without moving. Get up to take short walks every 1-2 hours. This is important. Ask for help if you feel weak or unsteady.  Return to your normal activities as told by your doctor. Ask your doctor what activities are safe for you. To help cramping and bloating:   Try walking around.  Put heat on your belly as told by your doctor. Use the heat source that your doctor recommends, such as a moist heat pack or a heating pad. ? Put a towel between your skin and the heat source. ? Leave the heat on for 20-30 minutes. ? Remove the heat if your skin turns bright red. This is very important if you are unable to feel pain, heat, or cold. You may have a greater risk of getting burned. General instructions  For the first 24 hours after the procedure: ? Do not drive or use machinery. ? Do not sign important documents. ? Do not drink alcohol. ? Do your daily activities more slowly than normal. ? Eat foods that are soft and easy to digest.  Take  over-the-counter or prescription medicines only as told by your doctor.  Keep all follow-up visits as told by your doctor. This is important. Contact a doctor if:  You have blood in your poop 2-3 days after the procedure. Get help right away if:  You have more than a small amount of blood in your poop.  You see large clumps of tissue (blood clots) in your poop.  Your belly is swollen.  You feel like you may vomit (nauseous).  You vomit.  You have a fever.  You have belly pain that gets worse, and medicine does not help your pain. Summary  After the procedure, it is common to have a small amount of blood in your poop. You may also have mild cramping and bloating in your belly.  For the first 24 hours after the procedure, do not drive or use machinery, do not sign important documents, and do not drink alcohol.  Get help right away if you have a lot of blood in your poop, feel like you may vomit, have a fever, or have more belly pain. This information is not intended to replace advice given to you by your health care provider. Make sure you discuss any questions you have with your health care provider. Document Revised: 08/28/2018 Document Reviewed: 08/28/2018 Elsevier Patient Education  [place].  Colon Polyps  Polyps are tissue growths inside the body. Polyps can grow in  many places, including the large intestine (colon). A polyp may be a round bump or a mushroom-shaped growth. You could have one polyp or several. Most colon polyps are noncancerous (benign). However, some colon polyps can become cancerous over time. Finding and removing the polyps early can help prevent this. What are the causes? The exact cause of colon polyps is not known. What increases the risk? You are more likely to develop this condition if you:  Have a family history of colon cancer or colon polyps.  Are older than 45 or older than 45 if you are African American.  Have inflammatory bowel  disease, such as ulcerative colitis or Crohn's disease.  Have certain hereditary conditions, such as: ? Familial adenomatous polyposis. ? Lynch syndrome. ? Turcot syndrome. ? Peutz-Jeghers syndrome.  Are overweight.  Smoke cigarettes.  Do not get enough exercise.  Drink too much alcohol.  Eat a diet that is high in fat and red meat and low in fiber.  Had childhood cancer that was treated with abdominal radiation. What are the signs or symptoms? Most polyps do not cause symptoms. If you have symptoms, they may include:  Blood coming from your rectum when having a bowel movement.  Blood in your stool. The stool may look dark red or black.  Abdominal pain.  A change in bowel habits, such as constipation or diarrhea. How is this diagnosed? This condition is diagnosed with a colonoscopy. This is a procedure in which a lighted, flexible scope is inserted into the anus and then passed into the colon to examine the area. Polyps are sometimes found when a colonoscopy is done as part of routine cancer screening tests. How is this treated? Treatment for this condition involves removing any polyps that are found. Most polyps can be removed during a colonoscopy. Those polyps will then be tested for cancer. Additional treatment may be needed depending on the results of testing. Follow these instructions at home: Lifestyle  Maintain a healthy weight, or lose weight if recommended by your health care provider.  Exercise every day or as told by your health care provider.  Do not use any products that contain nicotine or tobacco, such as cigarettes and e-cigarettes. If you need help quitting, ask your health care provider.  If you drink alcohol, limit how much you have: ? 0-1 drink a day for women. ? 0-2 drinks a day for men.  Be aware of how much alcohol is in your drink. In the [place] one drink equals one 12 oz bottle of beer (355 mL), one 5 oz glass of wine (148 mL), or one 1 oz shot  of hard liquor (44 mL). Eating and drinking   Eat foods that are high in fiber, such as fruits, vegetables, and whole grains.  Eat foods that are high in calcium and vitamin D, such as milk, cheese, yogurt, eggs, liver, fish, and broccoli.  Limit foods that are high in fat, such as fried foods and desserts.  Limit the amount of red meat and processed meat you eat, such as hot dogs, sausage, bacon, and lunch meats. General instructions  Keep all follow-up visits as told by your health care provider. This is important. ? This includes having regularly scheduled colonoscopies. ? Talk to your health care provider about when you need a colonoscopy. Contact a health care provider if:  You have new or worsening bleeding during a bowel movement.  You have new or increased blood in your stool.  You have a change  in bowel habits.  You lose weight for no known reason. Summary  Polyps are tissue growths inside the body. Polyps can grow in many places, including the colon.  Most colon polyps are noncancerous (benign), but some can become cancerous over time.  This condition is diagnosed with a colonoscopy.  Treatment for this condition involves removing any polyps that are found. Most polyps can be removed during a colonoscopy. This information is not intended to replace advice given to you by your health care provider. Make sure you discuss any questions you have with your health care provider. Document Revised: 05/19/2017 Document Reviewed: 05/19/2017 Elsevier Patient Education  [facility], Adult, Care After This sheet gives you information about how to care for yourself after your procedure. Your health care provider may also give you more specific instructions. If you have problems or questions, contact your health care provider. What can I expect after the procedure? After the procedure, it is common to have:  A sore throat.  Mild stomach pain or  discomfort.  Bloating.  Nausea. Follow these instructions at home:   Follow instructions from your health care provider about what to eat or drink after your procedure.  Return to your normal activities as told by your health care provider. Ask your health care provider what activities are safe for you.  Take over-the-counter and prescription medicines only as told by your health care provider.  Do not drive for 24 hours if you were given a sedative during your procedure.  Keep all follow-up visits as told by your health care provider. This is important. Contact a health care provider if you have:  A sore throat that lasts longer than one day.  Trouble swallowing. Get help right away if:  You vomit blood or your vomit looks like coffee grounds.  You have: ? A fever. ? Bloody, black, or tarry stools. ? A severe sore throat or you cannot swallow. ? Difficulty breathing. ? Severe pain in your chest or abdomen. Summary  After the procedure, it is common to have a sore throat, mild stomach discomfort, bloating, and nausea.  Do not drive for 24 hours if you were given a sedative during the procedure.  Follow instructions from your health care provider about what to eat or drink after your procedure.  Return to your normal activities as told by your health care provider. This information is not intended to replace advice given to you by your health care provider. Make sure you discuss any questions you have with your health care provider. Document Revised: 07/26/2017 Document Reviewed: 07/04/2017 Elsevier Patient Education  [email or web address].

## 2019-06-27 NOTE — H&P (Signed)
Jeanette Chang is an 79 y.o. female.   Chief Complaint: Patient is here for esophagogastroduodenoscopy with possible esophageal variceal banding and colonoscopy. 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  She has a history of grade 1 esophageal varices ordered on EGD of January 2019.  She denies heartburn nausea vomiting dysphagia abdominal pain melena or rectal bleeding.  She said she has lost 10 pounds since she started the prep.  She feels was all because he passed so much stool. Family history is positive for CRC in her mother who was in her 73s and required abdominal perineal resection.  Patient's last colonoscopy was in February 2016 at [facility] in [place]. Last aspirin dose was last aspirin dose was 4 days ago.  Past Medical History:  Diagnosis Date  . Abnormal LFTs   . Achilles bursitis or tendinitis   . Acute maxillary sinusitis   . Acute pharyngitis   . Anemia, unspecified   . Candidiasis of skin and nails   . Dermatophytosis of foot   . Dermatophytosis of the body   . Diverticulitis of colon (without mention of hemorrhage)(562.11)   . Edema   . Headache(784.0)   . Malaise and fatigue   . 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 Osteoarthrosis, unspecified whether generalized or localized, unspecified site   . Other dyspnea and respiratory abnormality   . Other psoriasis   . Other specified disorders of rotator cuff syndrome of shoulder and allied disorders   . Pain in joint   . Pure hypercholesterolemia   . RUQ pain   . Spasm of back muscles   . Thrombocytopenia, unspecified ([place])   . Type II or unspecified type diabetes mellitus without mention of complication, not stated as uncontrolled   . Unspecified essential hypertension   . Unspecified sinusitis (chronic)   . Urinary incontinence     Past Surgical History:  Procedure  Laterality Date  . APPENDECTOMY  1964  . BIOPSY  03/02/2017   Procedure: BIOPSY;  Surgeon: Rogene Houston, MD;  Location: [facility];  Service: Endoscopy;;  antral  . CAROTID ENDARTERECTOMY  2005   Left CEA  . CAROTID ENDARTERECTOMY  2009   Right CEA  . CHOLECYSTECTOMY  1982   Gall Bladder  . COLONOSCOPY  09  . ESOPHAGOGASTRODUODENOSCOPY N/A 03/02/2017   Procedure: ESOPHAGOGASTRODUODENOSCOPY (EGD);  Surgeon: Rogene Houston, MD;  Location: [facility];  Service: Endoscopy;  Laterality: N/A;  12:55  . NOSE SURGERY  1978  . PARATHYROIDECTOMY  1992  . POLYPECTOMY  03/02/2017   Procedure: POLYPECTOMY;  Surgeon: Rogene Houston, MD;  Location: [facility];  Service: Endoscopy;;  gastric   . Power port  03/06/2009   Right upper chest   . TONSILLECTOMY    . TUMOR EXCISION Right August 24, 2013   [facility] Dr. Ellen Henri, ENT  . VAGINAL HYSTERECTOMY  1972    Family History  Problem Relation Age of Onset  . Heart attack Mother   . Cancer Mother   . Heart attack Father   . Cancer Sister    Social History:  reports that she has quit smoking. Her smoking use included cigarettes. She quit smokeless tobacco use about 26 years ago. She reports current alcohol use of about 1.0 standard drinks of alcohol per week. She reports that she does not use drugs.  Allergies:  Allergies  Allergen Reactions  . Codeine Nausea And Vomiting and Rash  . Monascus Purpureus Went Yeast Other (See Comments)    Headaches, myalgias  . Niacin And Related Other (See Comments)    Headaches, myalgias  . Red Yeast Rice Other (See Comments)    Headaches, myalgias  . Actos [Pioglitazone Hydrochloride] Other (See Comments)    Severe headache  . Amaryl Nausea Only and Other (See Comments)    Severe headache (patient is tolerating 2 mg dosing)  . Iron Diarrhea  . Sanctura [Trospium Chloride] Nausea Only and Other (See Comments)    Severe headache  . Statins Other (See Comments)     Severe headache,  Hips and leg weakness that cause patient not to be able to function as per patient.  Lisbeth Ply [Fesoterodine Fumarate] Other (See Comments)    Cramps, severe headache  . Norco [Hydrocodone-Acetaminophen] Nausea And Vomiting and Anxiety  . Penicillins Swelling, Rash and Other (See Comments)    Has patient had a PCN reaction causing immediate rash, facial/tongue/throat swelling, SOB or lightheadedness with hypotension: No Has patient had a PCN reaction causing severe rash involving mucus membranes or skin necrosis: No Has patient had a PCN reaction that required hospitalization: Yes Has patient had a PCN reaction occurring within the last 10 years: No If all of the above answers are "NO", then may proceed with Cephalosporin use.   Marland Kitchen Ultram [Tramadol Hcl] Nausea And Vomiting    Medications Prior to Admission  Medication Sig Dispense Refill  . acetaminophen (TYLENOL) 500 MG tablet Take 1,000 mg by mouth every 6 (six) hours as needed for moderate pain or headache.     Marland Kitchen aspirin EC 81 MG tablet Take 81 mg by mouth every evening.    Marland Kitchen atenolol (TENORMIN) 50 MG tablet Take 50 mg by mouth at bedtime.     . Cholecalciferol (VITAMIN D3) 50 MCG (2000 UT) TABS Take 2,000 Units by mouth every evening.    . cyanocobalamin (,VITAMIN B-12,) 1000 MCG/ML injection Inject 1,000 mcg into the muscle every 30 (thirty) days.    . dapagliflozin propanediol (FARXIGA) 10 MG TABS tablet Take 10 mg by mouth daily.     Marland Kitchen glimepiride (AMARYL) 2 MG tablet Take 2 mg by mouth daily.    Marland Kitchen lisinopril (PRINIVIL,ZESTRIL) 40 MG tablet Take 40 mg by mouth daily.    . metFORMIN (GLUCOPHAGE) 500 MG tablet Take 1,000 mg by mouth 2 (two) times daily with a meal.     . vitamin B-12 (CYANOCOBALAMIN) 1000 MCG tablet Take 1,000 mcg by mouth daily.    Marland Kitchen ZINC OXIDE EX Apply 1 application topically 3 (three) times daily as needed (psoriasis flares).       Results for orders placed or performed during the hospital  encounter of 06/27/19 (from the past 48 hour(s))  Glucose, capillary     Status: Abnormal   Collection Time: 06/27/19  7:06 AM  Result Value Ref Range   Glucose-Capillary 271 (H) 70 - 99 mg/dL    Comment: Glucose reference range applies only to samples taken after fasting for at least 8 hours.   No results found.  Review of Systems  Blood pressure (!) 144/64, pulse 93, temperature 97.7 F (36.5 C), temperature source Oral, resp. rate 10, height 5' 3"  (1.6 m), weight 56.7 kg, SpO2 98 %. Physical Exam  Constitutional: She appears well-developed and well-nourished.  HENT:  Mouth/Throat: Oropharynx is clear and moist.  Eyes: Conjunctivae are normal. No scleral icterus.  Neck: No  thyromegaly present.  Cardiovascular: Normal rate, regular rhythm and normal heart sounds.  No murmur heard. Respiratory: Effort normal and breath sounds normal.  GI:  Abdomen is symmetrical.  Has lower midline scar.  Abdomen is soft.  Liver edge is easily palpable.  Surface is smooth.  Spleen is nonpalpable.  There is no tenderness or masses.  Musculoskeletal:        General: No edema.  Lymphadenopathy:    She has no cervical adenopathy.  Neurological: She is alert.  Skin: Skin is warm and dry.     Assessment/Plan Cirrhosis secondary to NASH. History of colon carcinoma in a first-degree relative younger than 31. Esophagogastroduodenoscopy with possible esophageal variceal banding and high risk screening colonoscopy.  Hildred Laser, MD 06/27/2019, 7:29 AM

## 2019-06-28 LAB — SURGICAL PATHOLOGY

## 2019-07-04 NOTE — Patient Instructions (Signed)
Jeanette Chang  07/04/2019     @PREFPERIOPPHARMACY @   Your procedure is scheduled on  07/10/2019   Report to [facility] at  [phone]  A.M.  Call this number if you have problems the morning of surgery:  [phone]   Remember:  Do not eat or drink after midnight.                       Take these medicines the morning of surgery with A SIP OF WATER  None. Take your atenolol the night before your procedure. DO NOT take any medications for diabetes the morning of your procedure.    Do not wear jewelry, make-up or nail polish.  Do not wear lotions, powders, or perfume. Please wear deodorant and brush your teeth.  Do not shave 48 hours prior to surgery.  Men may shave face and neck.  Do not bring valuables to the hospital.  [facility] is not responsible for any belongings or valuables.  Contacts, dentures or bridgework may not be worn into surgery.  Leave your suitcase in the car.  After surgery it may be brought to your room.  For patients admitted to the hospital, discharge time will be determined by your treatment team.  Patients discharged the day of surgery will not be allowed to drive home.   Name and phone number of your driver:   family Special instructions:  DO NOT smoke the morning of your procedure.  Please read over the following fact sheets that you were given. Anesthesia Post-op Instructions and Care and Recovery After Surgery       Arthroscopic Knee Ligament Repair, Care After This sheet gives you information about how to care for yourself after your procedure. Your health care provider may also give you more specific instructions. If you have problems or questions, contact your health care provider. What can I expect after the procedure? After the procedure, it is common to have:  Pain in your knee.  Bruising and swelling on your knee, calf, and ankle for 3-4 days.  Fatigue. Follow these instructions at home: If you have a brace or  immobilizer:  Wear the brace or immobilizer as told by your health care provider. Remove it only as told by your health care provider.  Loosen the splint or immobilizer if your toes tingle, become numb, or turn cold and blue.  Keep the brace or immobilizer clean. Bathing  Do not take baths, swim, or use a hot tub until your health care provider approves. Ask your health care provider if you can take showers.  Keep your bandage (dressing) dry until your health care provider says that it can be removed. Cover it and your brace or immobilizer with a watertight covering when you take a shower. Incision care   Follow instructions from your health care provider about how to take care of your incision. Make sure you: ? Wash your hands with soap and water before you change your bandage (dressing). If soap and water are not available, use hand sanitizer. ? Change your dressing as told by your health care provider. ? Leave stitches (sutures), skin glue, or adhesive strips in place. These skin closures may need to stay in place for 2 weeks or longer. If adhesive strip edges start to loosen and curl up, you may trim the loose edges. Do not remove adhesive strips completely unless your health care provider tells you to do that.  Check your incision area every day for signs of infection. Check for: ? More redness, swelling, or pain. ? More fluid or blood. ? Warmth. ? Pus or a bad smell. Managing pain, stiffness, and swelling   If directed, put ice on the affected area. ? If you have a removable brace or immobilizer, remove it as told by your health care provider. ? Put ice in a plastic bag. ? Place a towel between your skin and the bag or between your brace or immobilizer and the bag. ? Leave the ice on for 20 minutes, 2-3 times a day.  Move your toes often to avoid stiffness and to lessen swelling.  Raise (elevate) the injured area above the level of your heart while you are sitting or lying  down. Driving  Do not drive until your health care provider approves. If you have a brace or immobilizer on your leg, ask your health care provider when it is safe for you to drive.  Do not drive or use heavy machinery while taking prescription pain medicine. Activity  Rest as directed. Ask your health care provider what activities are safe for you.  Do physical therapy exercises as told by your health care provider. Physical therapy will help you regain strength and motion in your knee.  Follow instructions from your health care provider about: ? When you may start motion exercises. ? When you may start riding a stationary bike and doing other low-impact activities. ? When you may start to jog and do other high-impact activities. Safety  Do not use the injured limb to support your body weight until your health care provider says that you can. Use crutches as told by your health care provider. General instructions  Do not use any products that contain nicotine or tobacco, such as cigarettes and e-cigarettes. These can delay bone healing. If you need help quitting, ask your health care provider.  To prevent or treat constipation while you are taking prescription pain medicine, your health care provider may recommend that you: ? Drink enough fluid to keep your urine clear or pale yellow. ? Take over-the-counter or prescription medicines. ? Eat foods that are high in fiber, such as fresh fruits and vegetables, whole grains, and beans. ? Limit foods that are high in fat and processed sugars, such as fried and sweet foods.  Take over-the-counter and prescription medicines only as told by your health care provider.  Keep all follow-up visits as told by your health care provider. This is important. Contact a health care provider if:  You have more redness, swelling, or pain around an incision.  You have more fluid or blood coming from an incision.  Your incision feels warm to the  touch.  You have a fever.  You have pain or swelling in your knee, and it gets worse.  You have pain that does not get better with medicine. Get help right away if:  You have trouble breathing.  You have pus or a bad smell coming from an incision.  You have numbness and tingling near the knee joint. Summary  After the procedure, it is common to have knee pain with bruising and swelling on your knee, calf, and ankle.  Icing your knee and raising your leg above the level of your heart will help control the pain and the swelling.  Do physical therapy exercises as told by your health care provider. Physical therapy will help you regain strength and motion in your knee. This information is not  intended to replace advice given to you by your health care provider. Make sure you discuss any questions you have with your health care provider. Document Revised: 01/14/2017 Document Reviewed: 01/27/2016 Elsevier Patient Education  2020 [place] Anesthesia, Adult, Care After This sheet gives you information about how to care for yourself after your procedure. Your health care provider may also give you more specific instructions. If you have problems or questions, contact your health care provider. What can I expect after the procedure? After the procedure, the following side effects are common:  Pain or discomfort at the IV site.  Nausea.  Vomiting.  Sore throat.  Trouble concentrating.  Feeling cold or chills.  Weak or tired.  Sleepiness and fatigue.  Soreness and body aches. These side effects can affect parts of the body that were not involved in surgery. Follow these instructions at home:  For at least 24 hours after the procedure:  Have a responsible adult stay with you. It is important to have someone help care for you until you are awake and alert.  Rest as needed.  Do not: ? Participate in activities in which you could fall or become  injured. ? Drive. ? Use heavy machinery. ? Drink alcohol. ? Take sleeping pills or medicines that cause drowsiness. ? Make important decisions or sign legal documents. ? Take care of children on your own. Eating and drinking  Follow any instructions from your health care provider about eating or drinking restrictions.  When you feel hungry, start by eating small amounts of foods that are soft and easy to digest (bland), such as toast. Gradually return to your regular diet.  Drink enough fluid to keep your urine pale yellow.  If you vomit, rehydrate by drinking water, juice, or clear broth. General instructions  If you have sleep apnea, surgery and certain medicines can increase your risk for breathing problems. Follow instructions from your health care provider about wearing your sleep device: ? Anytime you are sleeping, including during daytime naps. ? While taking prescription pain medicines, sleeping medicines, or medicines that make you drowsy.  Return to your normal activities as told by your health care provider. Ask your health care provider what activities are safe for you.  Take over-the-counter and prescription medicines only as told by your health care provider.  If you smoke, do not smoke without supervision.  Keep all follow-up visits as told by your health care provider. This is important. Contact a health care provider if:  You have nausea or vomiting that does not get better with medicine.  You cannot eat or drink without vomiting.  You have pain that does not get better with medicine.  You are unable to pass urine.  You develop a skin rash.  You have a fever.  You have redness around your IV site that gets worse. Get help right away if:  You have difficulty breathing.  You have chest pain.  You have blood in your urine or stool, or you vomit blood. Summary  After the procedure, it is common to have a sore throat or nausea. It is also common to  feel tired.  Have a responsible adult stay with you for the first 24 hours after general anesthesia. It is important to have someone help care for you until you are awake and alert.  When you feel hungry, start by eating small amounts of foods that are soft and easy to digest (bland), such as toast. Gradually return to your  regular diet.  Drink enough fluid to keep your urine pale yellow.  Return to your normal activities as told by your health care provider. Ask your health care provider what activities are safe for you. This information is not intended to replace advice given to you by your health care provider. Make sure you discuss any questions you have with your health care provider. Document Revised: 02/04/2017 Document Reviewed: 09/17/2016 Elsevier Patient Education  [place]. How to Use Chlorhexidine for Bathing Chlorhexidine gluconate (CHG) is a germ-killing (antiseptic) solution that is used to clean the skin. It can get rid of the bacteria that normally live on the skin and can keep them away for about 24 hours. To clean your skin with CHG, you may be given:  A CHG solution to use in the shower or as part of a sponge bath.  A prepackaged cloth that contains CHG. Cleaning your skin with CHG may help lower the risk for infection:  While you are staying in the intensive care unit of the hospital.  If you have a vascular access, such as a central line, to provide short-term or long-term access to your veins.  If you have a catheter to drain urine from your bladder.  If you are on a ventilator. A ventilator is a machine that helps you breathe by moving air in and out of your lungs.  After surgery. What are the risks? Risks of using CHG include:  A skin reaction.  Hearing loss, if CHG gets in your ears.  Eye injury, if CHG gets in your eyes and is not rinsed out.  The CHG product catching fire. Make sure that you avoid smoking and flames after applying CHG to  your skin. Do not use CHG:  If you have a chlorhexidine allergy or have previously reacted to chlorhexidine.  On babies younger than 65 months of age. How to use CHG solution  Use CHG only as told by your health care provider, and follow the instructions on the label.  Use the full amount of CHG as directed. Usually, this is one bottle. During a shower Follow these steps when using CHG solution during a shower (unless your health care provider gives you different instructions): 1. Start the shower. 2. Use your normal soap and shampoo to wash your face and hair. 3. Turn off the shower or move out of the shower stream. 4. Pour the CHG onto a clean washcloth. Do not use any type of brush or rough-edged sponge. 5. Starting at your neck, lather your body down to your toes. Make sure you follow these instructions: ? If you will be having surgery, pay special attention to the part of your body where you will be having surgery. Scrub this area for at least 1 minute. ? Do not use CHG on your head or face. If the solution gets into your ears or eyes, rinse them well with water. ? Avoid your genital area. ? Avoid any areas of skin that have broken skin, cuts, or scrapes. ? Scrub your back and under your arms. Make sure to wash skin folds. 6. Let the lather sit on your skin for 1-2 minutes or as long as told by your health care provider. 7. Thoroughly rinse your entire body in the shower. Make sure that all body creases and crevices are rinsed well. 8. Dry off with a clean towel. Do not put any substances on your body afterward--such as powder, lotion, or perfume--unless you are told to do  so by your health care provider. Only use lotions that are recommended by the manufacturer. 9. Put on clean clothes or pajamas. 10. If it is the night before your surgery, sleep in clean sheets.  During a sponge bath Follow these steps when using CHG solution during a sponge bath (unless your health care provider  gives you different instructions): 1. Use your normal soap and shampoo to wash your face and hair. 2. Pour the CHG onto a clean washcloth. 3. Starting at your neck, lather your body down to your toes. Make sure you follow these instructions: ? If you will be having surgery, pay special attention to the part of your body where you will be having surgery. Scrub this area for at least 1 minute. ? Do not use CHG on your head or face. If the solution gets into your ears or eyes, rinse them well with water. ? Avoid your genital area. ? Avoid any areas of skin that have broken skin, cuts, or scrapes. ? Scrub your back and under your arms. Make sure to wash skin folds. 4. Let the lather sit on your skin for 1-2 minutes or as long as told by your health care provider. 5. Using a different clean, wet washcloth, thoroughly rinse your entire body. Make sure that all body creases and crevices are rinsed well. 6. Dry off with a clean towel. Do not put any substances on your body afterward--such as powder, lotion, or perfume--unless you are told to do so by your health care provider. Only use lotions that are recommended by the manufacturer. 7. Put on clean clothes or pajamas. 8. If it is the night before your surgery, sleep in clean sheets. How to use CHG prepackaged cloths  Only use CHG cloths as told by your health care provider, and follow the instructions on the label.  Use the CHG cloth on clean, dry skin.  Do not use the CHG cloth on your head or face unless your health care provider tells you to.  When washing with the CHG cloth: ? Avoid your genital area. ? Avoid any areas of skin that have broken skin, cuts, or scrapes. Before surgery Follow these steps when using a CHG cloth to clean before surgery (unless your health care provider gives you different instructions): 1. Using the CHG cloth, vigorously scrub the part of your body where you will be having surgery. Scrub using a back-and-forth  motion for 3 minutes. The area on your body should be completely wet with CHG when you are done scrubbing. 2. Do not rinse. Discard the cloth and let the area air-dry. Do not put any substances on the area afterward, such as powder, lotion, or perfume. 3. Put on clean clothes or pajamas. 4. If it is the night before your surgery, sleep in clean sheets.  For general bathing Follow these steps when using CHG cloths for general bathing (unless your health care provider gives you different instructions). 1. Use a separate CHG cloth for each area of your body. Make sure you wash between any folds of skin and between your fingers and toes. Wash your body in the following order, switching to a new cloth after each step: ? The front of your neck, shoulders, and chest. ? Both of your arms, under your arms, and your hands. ? Your stomach and groin area, avoiding the genitals. ? Your right leg and foot. ? Your left leg and foot. ? The back of your neck, your back, and your buttocks.  2. Do not rinse. Discard the cloth and let the area air-dry. Do not put any substances on your body afterward--such as powder, lotion, or perfume--unless you are told to do so by your health care provider. Only use lotions that are recommended by the manufacturer. 3. Put on clean clothes or pajamas. Contact a health care provider if:  Your skin gets irritated after scrubbing.  You have questions about using your solution or cloth. Get help right away if:  Your eyes become very red or swollen.  Your eyes itch badly.  Your skin itches badly and is red or swollen.  Your hearing changes.  You have trouble seeing.  You have swelling or tingling in your mouth or throat.  You have trouble breathing.  You swallow any chlorhexidine. Summary  Chlorhexidine gluconate (CHG) is a germ-killing (antiseptic) solution that is used to clean the skin. Cleaning your skin with CHG may help to lower your risk for infection.  You  may be given CHG to use for bathing. It may be in a bottle or in a prepackaged cloth to use on your skin. Carefully follow your health care provider's instructions and the instructions on the product label.  Do not use CHG if you have a chlorhexidine allergy.  Contact your health care provider if your skin gets irritated after scrubbing. This information is not intended to replace advice given to you by your health care provider. Make sure you discuss any questions you have with your health care provider. Document Revised: 04/20/2018 Document Reviewed: 12/30/2016 Elsevier Patient Education  [place].

## 2019-07-06 ENCOUNTER — Other Ambulatory Visit: Payer: Self-pay

## 2019-07-06 ENCOUNTER — Encounter (HOSPITAL_COMMUNITY): Payer: Self-pay

## 2019-07-06 ENCOUNTER — Other Ambulatory Visit (HOSPITAL_COMMUNITY)
Admission: RE | Admit: 2019-07-06 | Discharge: 2019-07-06 | Disposition: A | Discharge status: Home / Self Care | Payer: Medicare Other | Source: Ambulatory Visit | Attending: Orthopedic Surgery | Admitting: Orthopedic Surgery

## 2019-07-06 ENCOUNTER — Encounter (HOSPITAL_COMMUNITY)
Admission: RE | Admit: 2019-07-06 | Discharge: 2019-07-06 | Disposition: A | Discharge status: Home / Self Care | Payer: Medicare Other | Source: Ambulatory Visit | Attending: Orthopedic Surgery | Admitting: Orthopedic Surgery

## 2019-07-06 DIAGNOSIS — Z01818 Encounter for other preprocedural examination: Secondary | ICD-10-CM | POA: Diagnosis not present

## 2019-07-06 DIAGNOSIS — Z20822 Contact with and (suspected) exposure to covid-19: Secondary | ICD-10-CM | POA: Diagnosis not present

## 2019-07-06 LAB — CBC WITH DIFFERENTIAL/PLATELET
Abs Immature Granulocytes: 0.03 10*3/uL (ref 0.00–0.07)
Basophils Absolute: 0 10*3/uL (ref 0.0–0.1)
Basophils Relative: 1 %
Eosinophils Absolute: 0.1 10*3/uL (ref 0.0–0.5)
Eosinophils Relative: 3 %
HCT: 36.2 % (ref 36.0–46.0)
Hemoglobin: 11 g/dL — ABNORMAL LOW (ref 12.0–15.0)
Immature Granulocytes: 1 %
Lymphocytes Relative: 31 %
Lymphs Abs: 1.1 10*3/uL (ref 0.7–4.0)
MCH: 28.1 pg (ref 26.0–34.0)
MCHC: 30.4 g/dL (ref 30.0–36.0)
MCV: 92.3 fL (ref 80.0–100.0)
Monocytes Absolute: 0.3 10*3/uL (ref 0.1–1.0)
Monocytes Relative: 8 %
Neutro Abs: 2 10*3/uL (ref 1.7–7.7)
Neutrophils Relative %: 56 %
Platelets: 77 10*3/uL — ABNORMAL LOW (ref 150–400)
RBC: 3.92 MIL/uL (ref 3.87–5.11)
RDW: 15.5 % (ref 11.5–15.5)
WBC: 3.6 10*3/uL — ABNORMAL LOW (ref 4.0–10.5)
nRBC: 0 % (ref 0.0–0.2)

## 2019-07-06 LAB — BASIC METABOLIC PANEL
Anion gap: 16 — ABNORMAL HIGH (ref 5–15)
BUN: 22 mg/dL (ref 8–23)
CO2: 23 mmol/L (ref 22–32)
Calcium: 9.5 mg/dL (ref 8.9–10.3)
Chloride: 98 mmol/L (ref 98–111)
Creatinine, Ser: 0.77 mg/dL (ref 0.44–1.00)
GFR calc Af Amer: >60 mL/min (ref >60)
GFR calc non Af Amer: >60 mL/min (ref >60)
Glucose, Bld: 255 mg/dL — ABNORMAL HIGH (ref 70–99)
Potassium: 4.2 mmol/L (ref 3.5–5.1)
Sodium: 137 mmol/L (ref 135–145)

## 2019-07-06 LAB — HEMOGLOBIN A1C
Hgb A1c MFr Bld: 6.9 % — ABNORMAL HIGH (ref 4.8–5.6)
Mean Plasma Glucose: 151.33 mg/dL

## 2019-07-06 LAB — SARS CORONAVIRUS 2 (TAT 6-24 HRS): SARS Coronavirus 2: NEGATIVE

## 2019-07-09 NOTE — Pre-Procedure Instructions (Signed)
HgbA1C routed to Dr Aline Brochure and PCP.

## 2019-07-10 ENCOUNTER — Encounter (HOSPITAL_COMMUNITY): Payer: Self-pay | Admitting: Orthopedic Surgery

## 2019-07-10 ENCOUNTER — Telehealth: Payer: Self-pay | Admitting: Radiology

## 2019-07-10 ENCOUNTER — Ambulatory Visit (HOSPITAL_COMMUNITY): Payer: Medicare Other | Admitting: Anesthesiology

## 2019-07-10 ENCOUNTER — Encounter (HOSPITAL_COMMUNITY)
Admission: RE | Disposition: A | Discharge status: Home / Self Care | Payer: Self-pay | Source: Home / Self Care | Attending: Orthopedic Surgery

## 2019-07-10 ENCOUNTER — Ambulatory Visit (HOSPITAL_COMMUNITY)
Admission: RE | Admit: 2019-07-10 | Discharge: 2019-07-10 | Disposition: A | Discharge status: Home / Self Care | Payer: Medicare Other | Attending: Orthopedic Surgery | Admitting: Orthopedic Surgery

## 2019-07-10 ENCOUNTER — Encounter: Payer: Self-pay | Admitting: Orthopedic Surgery

## 2019-07-10 DIAGNOSIS — Z87891 Personal history of nicotine dependence: Secondary | ICD-10-CM | POA: Insufficient documentation

## 2019-07-10 DIAGNOSIS — S83242A Other tear of medial meniscus, current injury, left knee, initial encounter: Secondary | ICD-10-CM | POA: Insufficient documentation

## 2019-07-10 DIAGNOSIS — E7800 Pure hypercholesterolemia, unspecified: Secondary | ICD-10-CM | POA: Insufficient documentation

## 2019-07-10 DIAGNOSIS — M1712 Unilateral primary osteoarthritis, left knee: Secondary | ICD-10-CM | POA: Insufficient documentation

## 2019-07-10 DIAGNOSIS — K7460 Unspecified cirrhosis of liver: Secondary | ICD-10-CM | POA: Insufficient documentation

## 2019-07-10 DIAGNOSIS — M2242 Chondromalacia patellae, left knee: Secondary | ICD-10-CM | POA: Diagnosis not present

## 2019-07-10 DIAGNOSIS — E1151 Type 2 diabetes mellitus with diabetic peripheral angiopathy without gangrene: Secondary | ICD-10-CM | POA: Insufficient documentation

## 2019-07-10 DIAGNOSIS — X58XXXA Exposure to other specified factors, initial encounter: Secondary | ICD-10-CM | POA: Insufficient documentation

## 2019-07-10 DIAGNOSIS — S83232A Complex tear of medial meniscus, current injury, left knee, initial encounter: Secondary | ICD-10-CM

## 2019-07-10 DIAGNOSIS — S83232D Complex tear of medial meniscus, current injury, left knee, subsequent encounter: Secondary | ICD-10-CM

## 2019-07-10 DIAGNOSIS — I10 Essential (primary) hypertension: Secondary | ICD-10-CM | POA: Diagnosis not present

## 2019-07-10 DIAGNOSIS — E119 Type 2 diabetes mellitus without complications: Secondary | ICD-10-CM | POA: Diagnosis not present

## 2019-07-10 HISTORY — PX: KNEE ARTHROSCOPY WITH MEDIAL MENISECTOMY: SHX5651

## 2019-07-10 LAB — GLUCOSE, CAPILLARY
Glucose-Capillary: 103 mg/dL — ABNORMAL HIGH (ref 70–99)
Glucose-Capillary: 146 mg/dL — ABNORMAL HIGH (ref 70–99)

## 2019-07-10 SURGERY — ARTHROSCOPY, KNEE, WITH MEDIAL MENISCECTOMY
Anesthesia: General | Site: Knee | Laterality: Left

## 2019-07-10 MED ORDER — EPHEDRINE 5 MG/ML INJ
INTRAVENOUS | Status: AC
  Filled 2019-07-10: qty 10

## 2019-07-10 MED ORDER — ACETAMINOPHEN 500 MG PO TABS
500.0000 mg | ORAL_TABLET | Freq: Once | ORAL | Status: AC
  Administered 2019-07-10: 500 mg via ORAL
  Filled 2019-07-10: qty 1

## 2019-07-10 MED ORDER — PROPOFOL 10 MG/ML IV BOLUS
INTRAVENOUS | Status: DC | PRN
  Administered 2019-07-10: 100 ug/kg/min via INTRAVENOUS
  Administered 2019-07-10: 100 mg via INTRAVENOUS

## 2019-07-10 MED ORDER — VANCOMYCIN HCL IN DEXTROSE 1-5 GM/200ML-% IV SOLN: 1000.0000 mg | INTRAVENOUS | Status: DC

## 2019-07-10 MED ORDER — PROMETHAZINE HCL 12.5 MG PO TABS
12.5000 mg | ORAL_TABLET | Freq: Four times a day (QID) | ORAL | 0 refills | Status: DC | PRN
Start: 2019-07-10 — End: 2019-12-05

## 2019-07-10 MED ORDER — DEXAMETHASONE SODIUM PHOSPHATE 4 MG/ML IJ SOLN
INTRAMUSCULAR | Status: DC | PRN
Start: 2019-07-10 — End: 2019-07-10
  Administered 2019-07-10: 4 mg via INTRAVENOUS

## 2019-07-10 MED ORDER — LIDOCAINE HCL (PF) 2 % IJ SOLN
INTRAMUSCULAR | Status: DC | PRN
Start: 2019-07-10 — End: 2019-07-10
  Administered 2019-07-10: 80 mg via INTRADERMAL

## 2019-07-10 MED ORDER — SODIUM CHLORIDE 0.9 % IR SOLN
Status: DC | PRN
  Administered 2019-07-10 (×2): 3000 mL

## 2019-07-10 MED ORDER — EPINEPHRINE PF 1 MG/ML IJ SOLN
INTRAMUSCULAR | Status: AC
  Filled 2019-07-10: qty 4

## 2019-07-10 MED ORDER — CHLORHEXIDINE GLUCONATE 0.12 % MT SOLN
15.0000 mL | Freq: Once | OROMUCOSAL | Status: AC
  Administered 2019-07-10: 15 mL via OROMUCOSAL

## 2019-07-10 MED ORDER — VANCOMYCIN HCL IN DEXTROSE 1-5 GM/200ML-% IV SOLN
INTRAVENOUS | Status: AC
  Filled 2019-07-10: qty 200

## 2019-07-10 MED ORDER — FENTANYL CITRATE (PF) 100 MCG/2ML IJ SOLN: 25.0000 ug | INTRAMUSCULAR | Status: DC | PRN

## 2019-07-10 MED ORDER — SODIUM CHLORIDE 0.9 % IR SOLN
Status: DC | PRN
  Administered 2019-07-10: 1000 mL

## 2019-07-10 MED ORDER — VANCOMYCIN HCL 1000 MG IV SOLR
INTRAVENOUS | Status: DC | PRN
  Administered 2019-07-10: 1000 mg via INTRAVENOUS

## 2019-07-10 MED ORDER — BUPIVACAINE-EPINEPHRINE (PF) 0.5% -1:200000 IJ SOLN
INTRAMUSCULAR | Status: DC | PRN
  Administered 2019-07-10: 30 mL via PERINEURAL

## 2019-07-10 MED ORDER — MEPERIDINE HCL 50 MG/ML IJ SOLN: 6.2500 mg | INTRAMUSCULAR | Status: DC | PRN

## 2019-07-10 MED ORDER — PROPOFOL 10 MG/ML IV BOLUS
INTRAVENOUS | Status: AC
  Filled 2019-07-10: qty 40

## 2019-07-10 MED ORDER — ONDANSETRON HCL 4 MG/2ML IJ SOLN
INTRAMUSCULAR | Status: DC | PRN
  Administered 2019-07-10: 4 mg via INTRAVENOUS

## 2019-07-10 MED ORDER — DEXAMETHASONE SODIUM PHOSPHATE 10 MG/ML IJ SOLN
INTRAMUSCULAR | Status: AC
  Filled 2019-07-10: qty 1

## 2019-07-10 MED ORDER — EPHEDRINE SULFATE 50 MG/ML IJ SOLN
INTRAMUSCULAR | Status: DC | PRN
  Administered 2019-07-10: 10 mg via INTRAVENOUS

## 2019-07-10 MED ORDER — ONDANSETRON HCL 4 MG/2ML IJ SOLN: 4.0000 mg | Freq: Once | INTRAMUSCULAR | Status: DC | PRN

## 2019-07-10 MED ORDER — ACETAMINOPHEN 500 MG PO TABS: 500.0000 mg | ORAL_TABLET | Freq: Four times a day (QID) | ORAL | 0 refills | Status: DC | PRN

## 2019-07-10 MED ORDER — TRAMADOL HCL 50 MG PO TABS
50.0000 mg | ORAL_TABLET | Freq: Once | ORAL | Status: AC
  Administered 2019-07-10: 50 mg via ORAL
  Filled 2019-07-10: qty 1

## 2019-07-10 MED ORDER — BUPIVACAINE-EPINEPHRINE (PF) 0.5% -1:200000 IJ SOLN
INTRAMUSCULAR | Status: AC
  Filled 2019-07-10: qty 60

## 2019-07-10 MED ORDER — ONDANSETRON HCL 4 MG/2ML IJ SOLN
4.0000 mg | Freq: Once | INTRAMUSCULAR | Status: AC
  Administered 2019-07-10: 4 mg via INTRAVENOUS
  Filled 2019-07-10: qty 2

## 2019-07-10 MED ORDER — LACTATED RINGERS IV SOLN
Freq: Once | INTRAVENOUS | Status: AC
  Administered 2019-07-10: 1000 mL via INTRAVENOUS

## 2019-07-10 MED ORDER — IBUPROFEN 800 MG PO TABS
800.0000 mg | ORAL_TABLET | Freq: Three times a day (TID) | ORAL | 0 refills | Status: DC | PRN
Start: 2019-07-10 — End: 2020-03-07

## 2019-07-10 MED ORDER — ONDANSETRON HCL 4 MG/2ML IJ SOLN
INTRAMUSCULAR | Status: AC
  Filled 2019-07-10: qty 2

## 2019-07-10 MED ORDER — TRAMADOL HCL 50 MG PO TABS: 50.0000 mg | ORAL_TABLET | Freq: Four times a day (QID) | ORAL | 0 refills | Status: AC | PRN

## 2019-07-10 MED ORDER — LIDOCAINE 2% (20 MG/ML) 5 ML SYRINGE
INTRAMUSCULAR | Status: AC
  Filled 2019-07-10: qty 5

## 2019-07-10 MED ORDER — FENTANYL CITRATE (PF) 100 MCG/2ML IJ SOLN
INTRAMUSCULAR | Status: AC
  Filled 2019-07-10: qty 2

## 2019-07-10 MED ORDER — LACTATED RINGERS IV SOLN
INTRAVENOUS | Status: DC | PRN
Start: 2019-07-10 — End: 2019-07-10

## 2019-07-10 MED ORDER — ORAL CARE MOUTH RINSE: 15.0000 mL | Freq: Once | OROMUCOSAL | Status: AC

## 2019-07-10 MED ORDER — FENTANYL CITRATE (PF) 100 MCG/2ML IJ SOLN
INTRAMUSCULAR | Status: DC | PRN
  Administered 2019-07-10 (×4): 25 ug via INTRAVENOUS

## 2019-07-10 MED ORDER — PROPOFOL 10 MG/ML IV BOLUS
INTRAVENOUS | Status: AC
  Filled 2019-07-10: qty 20

## 2019-07-10 SURGICAL SUPPLY — 50 items
ABLATOR ASPIRATE 50D MULTI-PRT (SURGICAL WAND) ×1 IMPLANT
BAG HAMPER (MISCELLANEOUS) ×2 IMPLANT
BLADE SHAVER TORPEDO 4X13 (MISCELLANEOUS) ×1 IMPLANT
BLADE SURG SZ11 CARB STEEL (BLADE) ×2 IMPLANT
BNDG ELASTIC 6X5.8 VLCR NS LF (GAUZE/BANDAGES/DRESSINGS) ×3 IMPLANT
CHLORAPREP W/TINT 26 (MISCELLANEOUS) ×2 IMPLANT
CLOTH BEACON ORANGE TIMEOUT ST (SAFETY) ×2 IMPLANT
COOLER CRYO IC GRAV AND TUBE (ORTHOPEDIC SUPPLIES) ×2 IMPLANT
COVER WAND RF STERILE (DRAPES) ×4 IMPLANT
CUFF CRYO KNEE18X23 MED (MISCELLANEOUS) ×1 IMPLANT
CUFF TOURN SGL QUICK 24 (TOURNIQUET CUFF) ×1
CUFF TRNQT CYL 24X4X16.5-23 (TOURNIQUET CUFF) IMPLANT
DECANTER SPIKE VIAL GLASS SM (MISCELLANEOUS) ×4 IMPLANT
DRAPE HALF SHEET 40X57 (DRAPES) ×2 IMPLANT
GAUZE 4X4 16PLY RFD (DISPOSABLE) ×2 IMPLANT
GAUZE SPONGE 4X4 12PLY STRL (GAUZE/BANDAGES/DRESSINGS) ×2 IMPLANT
GAUZE SPONGE 4X4 16PLY XRAY LF (GAUZE/BANDAGES/DRESSINGS) ×2 IMPLANT
GAUZE XEROFORM 5X9 LF (GAUZE/BANDAGES/DRESSINGS) ×2 IMPLANT
GLOVE BIO SURGEON STRL SZ7 (GLOVE) ×1 IMPLANT
GLOVE BIOGEL PI IND STRL 7.0 (GLOVE) ×2 IMPLANT
GLOVE BIOGEL PI INDICATOR 7.0 (GLOVE) ×2
GLOVE SKINSENSE NS SZ8.0 LF (GLOVE) ×1
GLOVE SKINSENSE STRL SZ8.0 LF (GLOVE) ×1 IMPLANT
GLOVE SS N UNI LF 8.5 STRL (GLOVE) ×2 IMPLANT
GOWN STRL REUS W/TWL LRG LVL3 (GOWN DISPOSABLE) ×2 IMPLANT
GOWN STRL REUS W/TWL XL LVL3 (GOWN DISPOSABLE) ×2 IMPLANT
IV NS IRRIG 3000ML ARTHROMATIC (IV SOLUTION) ×4 IMPLANT
KIT BLADEGUARD II DBL (SET/KITS/TRAYS/PACK) ×2 IMPLANT
KIT TURNOVER CYSTO (KITS) ×2 IMPLANT
MANIFOLD NEPTUNE II (INSTRUMENTS) ×2 IMPLANT
MARKER SKIN DUAL TIP RULER LAB (MISCELLANEOUS) ×2 IMPLANT
NDL HYPO 18GX1.5 BLUNT FILL (NEEDLE) ×1 IMPLANT
NDL HYPO 21X1.5 SAFETY (NEEDLE) ×1 IMPLANT
NDL SPNL 18GX3.5 QUINCKE PK (NEEDLE) ×1 IMPLANT
NEEDLE HYPO 18GX1.5 BLUNT FILL (NEEDLE) ×2 IMPLANT
NEEDLE HYPO 21X1.5 SAFETY (NEEDLE) ×2 IMPLANT
NEEDLE SPNL 18GX3.5 QUINCKE PK (NEEDLE) ×2 IMPLANT
NS IRRIG 1000ML POUR BTL (IV SOLUTION) ×2 IMPLANT
PACK ARTHROSCOPY WL (CUSTOM PROCEDURE TRAY) ×1 IMPLANT
PAD ABD 5X9 TENDERSORB (GAUZE/BANDAGES/DRESSINGS) ×2 IMPLANT
PAD ARMBOARD 7.5X6 YLW CONV (MISCELLANEOUS) ×2 IMPLANT
PADDING CAST COTTON 6X4 STRL (CAST SUPPLIES) ×3 IMPLANT
PORT APPOLLO RF 90DEGREE MULTI (SURGICAL WAND) IMPLANT
SET ARTHROSCOPY INST (INSTRUMENTS) ×2 IMPLANT
SET BASIN LINEN APH (SET/KITS/TRAYS/PACK) ×2 IMPLANT
SUT ETHILON 3 0 FSL (SUTURE) ×2 IMPLANT
SYR 10ML LL (SYRINGE) ×2 IMPLANT
SYR 30ML LL (SYRINGE) ×2 IMPLANT
TUBE CONNECTING 12X1/4 (SUCTIONS) ×4 IMPLANT
TUBING IN/OUT FLOW W/MAIN PUMP (TUBING) ×2 IMPLANT

## 2019-07-10 NOTE — Op Note (Signed)
07/10/2019  8:53 AM  PATIENT:  Jeanette Chang  79 y.o. 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 posterior horn medial meniscus tear with flipped fragment  ACL PCL normal  Lateral compartment normal  Chondromalacia patellofemoral joint    SURGEON:  Surgeon(s) and Role:    Carole Civil, MD - Primary  PHYSICIAN ASSISTANT:   ASSISTANTS: none   ANESTHESIA:   general  EBL:  5 mL   BLOOD ADMINISTERED:none  DRAINS: none   LOCAL MEDICATIONS USED:  MARCAINE     SPECIMEN:  No Specimen  DISPOSITION OF SPECIMEN:  N/A  COUNTS:  YES  TOURNIQUET:  * Missing tourniquet times found for documented tourniquets in log: [id number] *  DICTATION: .Dragon Dictation  PLAN OF CARE: Discharge to home after PACU  PATIENT DISPOSITION:  PACU - hemodynamically stable.   Delay start of Pharmacological VTE agent (>24hrs) due to surgical blood loss or risk of bleeding: not applicable  The surgery was done in the following manner  The patient was seen in preop and properly identified and chart review completed surgeon's initials marked over the left knee.  She was taken to the operating room for general anesthesia.  She was in the supine position.  We did use the lateral post.  After sterile prep and drape timeout was completed  The scope was placed in the joint through a lateral portal with diagnostic arthroscopy was performed.  She had a lot of synovitis around the joint showed a medial plica.  She had chondromalacia of the patellofemoral joint.  The notch area ACL PCL normal.  The lateral compartment showed free edge fraying of the  lateral meniscus without tear joint surfaces look normal  Medial compartment showed arthritis of the medial compartment with a torn medial meniscus and a flipped fragment over the superior portion of the posterior horn with a parrot-beak tear of the remaining meniscus at the junction of the posterior horn and body.  Through medial portal with a shaver was placed in the joint and the parrot-beak portion was resected.  However the flipped fragment had to be approached from having the scope medially and coming through the lateral compartment with a shaver arthroscopic biter and ArthroCare wand  We were able to obtain a stable meniscal rim preserving the bulk of the root of the meniscus  After irrigation of the joint I removed the medial plica  Meniscal fragments were suctioned free with irrigation and meniscal shaver.  The portals were closed with 3-0 nylon suture.  The joint was injected with 30 cc of Marcaine with epinephrine  Sterile dressings and Cryo/Cuff were applied  She was then extubated and taken to recovery room in stable condition  Postop plan  Weightbearing as tolerated with a walker Brace will be given in the office for the tibial stress reaction Otherwise routine postop care  [id number]

## 2019-07-10 NOTE — Anesthesia Procedure Notes (Signed)
Procedure Name: LMA Insertion Performed by: Tacy Learn, CRNA Pre-anesthesia Checklist: Patient identified, Emergency Drugs available, Suction available, Patient being monitored and Timeout performed Patient Re-evaluated:Patient Re-evaluated prior to induction Oxygen Delivery Method: Circle system utilized Preoxygenation: Pre-oxygenation with 100% oxygen Induction Type: IV induction LMA: LMA inserted LMA Size: 3.0 Placement Confirmation: positive ETCO2,  CO2 detector and breath sounds checked- equal and bilateral Tube secured with: Tape

## 2019-07-10 NOTE — Discharge Instructions (Signed)
General Anesthesia, Adult, Care After This sheet gives you information about how to care for yourself after your procedure. Your health care provider may also give you more specific instructions. If you have problems or questions, contact your health care provider. What can I expect after the procedure? After the procedure, the following side effects are common: Pain or discomfort at the IV site. Nausea. Vomiting. Sore throat. Trouble concentrating. Feeling cold or chills. Weak or tired. Sleepiness and fatigue. Soreness and body aches. These side effects can affect parts of the body that were not involved in surgery. Follow these instructions at home:  For at least 24 hours after the procedure: Have a responsible adult stay with you. It is important to have someone help care for you until you are awake and alert. Rest as needed. Do not: Participate in activities in which you could fall or become injured. Drive. Use heavy machinery. Drink alcohol. Take sleeping pills or medicines that cause drowsiness. Make important decisions or sign legal documents. Take care of children on your own. Eating and drinking Follow any instructions from your health care provider about eating or drinking restrictions. When you feel hungry, start by eating small amounts of foods that are soft and easy to digest (bland), such as toast. Gradually return to your regular diet. Drink enough fluid to keep your urine pale yellow. If you vomit, rehydrate by drinking water, juice, or clear broth. General instructions If you have sleep apnea, surgery and certain medicines can increase your risk for breathing problems. Follow instructions from your health care provider about wearing your sleep device: Anytime you are sleeping, including during daytime naps. While taking prescription pain medicines, sleeping medicines, or medicines that make you drowsy. Return to your normal activities as told by your health care  provider. Ask your health care provider what activities are safe for you. Take over-the-counter and prescription medicines only as told by your health care provider. If you smoke, do not smoke without supervision. Keep all follow-up visits as told by your health care provider. This is important. Contact a health care provider if: You have nausea or vomiting that does not get better with medicine. You cannot eat or drink without vomiting. You have pain that does not get better with medicine. You are unable to pass urine. You develop a skin rash. You have a fever. You have redness around your IV site that gets worse. Get help right away if: You have difficulty breathing. You have chest pain. You have blood in your urine or stool, or you vomit blood. Summary After the procedure, it is common to have a sore throat or nausea. It is also common to feel tired. Have a responsible adult stay with you for the first 24 hours after general anesthesia. It is important to have someone help care for you until you are awake and alert. When you feel hungry, start by eating small amounts of foods that are soft and easy to digest (bland), such as toast. Gradually return to your regular diet. Drink enough fluid to keep your urine pale yellow. Return to your normal activities as told by your health care provider. Ask your health care provider what activities are safe for you. This information is not intended to replace advice given to you by your health care provider. Make sure you discuss any questions you have with your health care provider. Document Revised: 02/04/2017 Document Reviewed: 09/17/2016 Elsevier Patient Education  [place].   Tramadol tablets What is this  medicine? TRAMADOL (TRA ma dole) is a pain reliever. It is used to treat moderate to severe pain in adults. This medicine may be used for other purposes; ask your health care provider or pharmacist if you have questions. COMMON  BRAND NAME(S): Ultram What should I tell my health care provider before I take this medicine? They need to know if you have any of these conditions:  brain tumor  depression  drug abuse or addiction  head injury  if you frequently drink alcohol containing drinks  kidney disease or trouble passing urine  liver disease  lung disease, asthma, or breathing problems  seizures or epilepsy  suicidal thoughts, plans, or attempt; a previous suicide attempt by you or a family member  an unusual or allergic reaction to tramadol, codeine, other medicines, foods, dyes, or preservatives  pregnant or trying to get pregnant  breast-feeding How should I use this medicine? Take this medicine by mouth with a full glass of water. Follow the directions on the prescription label. You can take it with or without food. If it upsets your stomach, take it with food. Do not take your medicine more often than directed. A special MedGuide will be given to you by the pharmacist with each prescription and refill. Be sure to read this information carefully each time. Talk to your pediatrician regarding the use of this medicine in children. Special care may be needed. Overdosage: If you think you have taken too much of this medicine contact a poison control center or emergency room at once. NOTE: This medicine is only for you. Do not share this medicine with others. What if I miss a dose? If you miss a dose, take it as soon as you can. If it is almost time for your next dose, take only that dose. Do not take double or extra doses. What may interact with this medicine? Do not take this medication with any of the following medicines:  MAOIs like Carbex, Eldepryl, Marplan, Nardil, and Parnate This medicine may also interact with the following medications:  alcohol  antihistamines for allergy, cough and cold  certain medicines for anxiety or sleep  certain medicines for depression like amitriptyline,  fluoxetine, sertraline  certain medicines for migraine headache like almotriptan, eletriptan, frovatriptan, naratriptan, rizatriptan, sumatriptan, zolmitriptan  certain medicines for seizures like carbamazepine, oxcarbazepine, phenobarbital, primidone  certain medicines that treat or prevent blood clots like warfarin  digoxin  furazolidone  general anesthetics like halothane, isoflurane, methoxyflurane, propofol  linezolid  local anesthetics like lidocaine, pramoxine, tetracaine  medicines that relax muscles for surgery  other narcotic medicines for pain or cough  phenothiazines like chlorpromazine, mesoridazine, prochlorperazine, thioridazine  procarbazine This list may not describe all possible interactions. Give your health care provider a list of all the medicines, herbs, non-prescription drugs, or dietary supplements you use. Also tell them if you smoke, drink alcohol, or use illegal drugs. Some items may interact with your medicine. What should I watch for while using this medicine? Tell your health care provider if your pain does not go away, if it gets worse, or if you have new or a different type of pain. You may develop tolerance to this drug. Tolerance means that you will need a higher dose of the drug for pain relief. Tolerance is normal and is expected if you take this drug for a long time. Do not suddenly stop taking your drug because you may develop a severe reaction. Your body becomes used to the drug. This does NOT  mean you are addicted. Addiction is a behavior related to getting and using a drug for a nonmedical reason. If you have pain, you have a medical reason to take pain drug. Your health care provider will tell you how much drug to take. If your health care provider wants you to stop the drug, the dose will be slowly lowered over time to avoid any side effects. If you take other drugs that also cause drowsiness like other narcotic pain drugs, benzodiazepines, or  other drugs for sleep, you may have more side effects. Give your health care provider a list of all drugs you use. He or she will tell you how much drug to take. Do not take more drug than directed. Get emergency help right away if you have trouble breathing or are unusually tired or sleepy. Talk to your health care provider about naloxone and how to get it. Naloxone is an emergency drug used for an opioid overdose. An overdose can happen if you take too much opioid. It can also happen if an opioid is taken with some other drugs or substances, like alcohol. Know the symptoms of an overdose, like trouble breathing, unusually tired or sleepy, or not being able to respond or wake up. Make sure to tell caregivers and close contacts where it is stored. Make sure they know how to use it. After naloxone is given, you must get emergency help right away. Naloxone is a temporary treatment. Repeat doses may be needed. This drug may cause serious skin reactions. They can happen weeks to months after starting the drug. Contact your health care provider right away if you notice fevers or flu-like symptoms with a rash. The rash may be red or purple and then turn into blisters or peeling of the skin. Or, you might notice a red rash with swelling of the face, lips, or lymph nodes in your neck or under your arms. You may get drowsy or dizzy. Do not drive, use machinery, or do anything that needs mental alertness until you know how this drug affects you. Do not stand up or sit up quickly, especially if you are an older patient. This reduces the risk of dizzy or fainting spells. Alcohol may interfere with the effect of this drug. Avoid alcoholic drinks. This drug will cause constipation. If you do not have a bowel movement for 3 days, call your health care provider. Your mouth may get dry. Chewing sugarless gum or sucking hard candy and drinking plenty of water may help. Contact your health care provider if the problem does not go  away or is severe. What side effects may I notice from receiving this medicine? Side effects that you should report to your doctor or health care professional as soon as possible:  allergic reactions like skin rash, itching or hives, swelling of the face, lips, or tongue  breathing problems  confusion  redness, blistering, peeling or loosening of the skin, including inside the mouth  seizures  signs and symptoms of low blood pressure like dizziness; feeling faint or lightheaded, falls; unusually weak or tired  trouble passing urine or change in the amount of urine Side effects that usually do not require medical attention (report to your doctor or health care professional if they continue or are bothersome):  constipation  dry mouth  nausea, vomiting  tiredness This list may not describe all possible side effects. Call your doctor for medical advice about side effects. You may report side effects to FDA at 1-800-FDA-1088. Where  should I keep my medicine? Keep out of the reach of children. This medicine may cause accidental overdose and death if it taken by other adults, children, or pets. Mix any unused medicine with a substance like cat litter or coffee grounds. Then throw the medicine away in a sealed container like a sealed bag or a coffee can with a lid. Do not use the medicine after the expiration date. Store at room temperature between 15 and 30 degrees C (59 and 86 degrees F). NOTE: This sheet is a summary. It may not cover all possible information. If you have questions about this medicine, talk to your doctor, pharmacist, or health care provider.  2020 Elsevier/Gold Standard (2018-09-11 13:08:25)   Promethazine tablets What is this medicine? PROMETHAZINE (proe METH a zeen) is an antihistamine. It is used to treat allergic reactions and to treat or prevent nausea and vomiting from illness or motion sickness. It is also used to make you sleep before surgery, and to help  treat pain or nausea after surgery. This medicine may be used for other purposes; ask your health care provider or pharmacist if you have questions. COMMON BRAND NAME(S): Phenergan What should I tell my health care provider before I take this medicine? They need to know if you have any of these conditions:  blockage in your bowel  diabetes  glaucoma  have trouble controlling your muscles  heart disease  liver disease  low blood counts, like low white cell, platelet, or red cell counts  lung or breathing disease, like asthma  Parkinson's disease  prostate disease  seizures  stomach or intestine problems  trouble passing urine  an unusual or allergic reaction to promethazine, sulfites, other medicines, foods, dyes, or preservatives  pregnant or trying to get pregnant  breast-feeding How should I use this medicine? Take this medicine by mouth with a glass of water. Follow the directions on the prescription label. Take your doses at regular intervals. Do not take your medicine more often than directed. Talk to your pediatrician regarding the use of this medicine in children. Special care may be needed. This medicine should not be given to infants and children younger than 70 years old. Overdosage: If you think you have taken too much of this medicine contact a poison control center or emergency room at once. NOTE: This medicine is only for you. Do not share this medicine with others. What if I miss a dose? If you miss a dose, take it as soon as you can. If it is almost time for your next dose, take only that dose. Do not take double or extra doses. What may interact with this medicine?  alcohol  antihistamines for allergy, cough, and cold  atropine  certain medicines for anxiety or sleep  certain medicines for bladder problems like oxybutynin, tolterodine  certain medicines for depression like amitriptyline, fluoxetine, sertraline  certain medicines for  Parkinson's disease like benztropine, trihexyphenidyl  certain medicines for stomach problems like dicyclomine, hyoscyamine  certain medicines for travel sickness like scopolamine  epinephrine  general anesthetics like halothane, isoflurane, methoxyflurane, propofol  ipratropium  MAOIs like Marplan, Nardil, and Parnate  medicines for high blood pressure  medicines for seizures like phenobarbital, primidone, phenytoin  medicines that relax muscles for surgery  metoclopramide  narcotic medicines for pain This list may not describe all possible interactions. Give your health care provider a list of all the medicines, herbs, non-prescription drugs, or dietary supplements you use. Also tell them if you smoke, drink  alcohol, or use illegal drugs. Some items may interact with your medicine. What should I watch for while using this medicine? Visit your health care professional for regular checks on your progress. Tell your health care professional if symptoms do not start to get better or if they get worse. You may get drowsy or dizzy. Do not drive, use machinery, or do anything that needs mental alertness until you know how this medicine affects you. To reduce the risk of dizzy or fainting spells, do not stand or sit up quickly, especially if you are an older patient. Alcohol may increase dizziness and drowsiness. Avoid alcoholic drinks. Your mouth may get dry. Chewing sugarless gum or sucking hard candy, and drinking plenty of water may help. Contact your doctor if the problem does not go away or is severe. This medicine may cause dry eyes and blurred vision. If you wear contact lenses you may feel some discomfort. Lubricating drops may help. See your eye doctor if the problem does not go away or is severe. This medicine can make you more sensitive to the sun. Keep out of the sun. If you cannot avoid being in the sun, wear protective clothing and use sunscreen. Do not use sun lamps or tanning  beds/booths. This medicine may increase blood sugar. Ask your health care provider if changes in diet or medicines are needed if you have diabetes. What side effects may I notice from receiving this medicine? Side effects that you should report to your doctor or health care professional as soon as possible:  allergic reactions like skin rash, itching or hives, swelling of the face, lips, or tongue  breathing problems  changes in vision  confusion  fever, chills, sore throat  pain, redness, or irritation at site where injected  seizures  signs and symptoms of high blood sugar such as being more thirsty or hungry or having to urinate more than normal. You may also feel very tired or have blurry vision.  signs and symptoms of liver injury like dark yellow or brown urine; general ill feeling or flu-like symptoms; light-colored stools; loss of appetite; nausea; right upper belly pain; unusually weak or tired; yellowing of the eyes or skin  signs and symptoms of low blood pressure like dizziness; feeling faint or lightheaded, falls; unusually weak or tired  trouble passing urine or change in the amount of urine  trouble swallowing  uncontrollable movements of the arms, face, head, mouth, neck, or upper body  unusual bruising or bleeding  unusually weak or tired Side effects that usually do not require medical attention (report to your doctor or health care professional if they continue or are bothersome):  drowsiness  dry mouth  restlessness This list may not describe all possible side effects. Call your doctor for medical advice about side effects. You may report side effects to FDA at 1-800-FDA-1088. Where should I keep my medicine? Keep out of the reach of children. Store at room temperature, between 20 and 25 degrees C (68 and 77 degrees F). Protect from light. Throw away any unused medicine after the expiration date. NOTE: This sheet is a summary. It may not cover all  possible information. If you have questions about this medicine, talk to your doctor, pharmacist, or health care provider.  2020 Elsevier/Gold Standard (2018-12-11 13:27:34)

## 2019-07-10 NOTE — Telephone Encounter (Signed)
Completed prior auth for her phenergan via cover my meds

## 2019-07-10 NOTE — Brief Op Note (Signed)
07/10/2019  8:53 AM  PATIENT:  Jeanette Chang  79 y.o. 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 posterior horn medial meniscus tear with flipped fragment  ACL PCL normal  Lateral compartment normal  Chondromalacia patellofemoral joint    SURGEON:  Surgeon(s) and Role:    Carole Civil, MD - Primary  PHYSICIAN ASSISTANT:   ASSISTANTS: none   ANESTHESIA:   general  EBL:  5 mL   BLOOD ADMINISTERED:none  DRAINS: none   LOCAL MEDICATIONS USED:  MARCAINE     SPECIMEN:  No Specimen  DISPOSITION OF SPECIMEN:  N/A  COUNTS:  YES  TOURNIQUET:  * Missing tourniquet times found for documented tourniquets in log: [id number] *  DICTATION: .Dragon Dictation  PLAN OF CARE: Discharge to home after PACU  PATIENT DISPOSITION:  PACU - hemodynamically stable.   Delay start of Pharmacological VTE agent (>24hrs) due to surgical blood loss or risk of bleeding: not applicable  The surgery was done in the following manner  The patient was seen in preop and properly identified and chart review completed surgeon's initials marked over the left knee.  She was taken to the operating room for general anesthesia.  She was in the supine position.  We did use the lateral post.  After sterile prep and drape timeout was completed  The scope was placed in the joint through a lateral portal with diagnostic arthroscopy was performed.  She had a lot of synovitis around the joint showed a medial plica.  She had chondromalacia of the patellofemoral joint.  The notch area ACL PCL normal.  The lateral compartment showed free edge fraying of the  lateral meniscus without tear joint surfaces look normal  Medial compartment showed arthritis of the medial compartment with a torn medial meniscus and a flipped fragment over the superior portion of the posterior horn with a parrot-beak tear of the remaining meniscus at the junction of the posterior horn and body.  Through medial portal with a shaver was placed in the joint and the parrot-beak portion was resected.  However the flipped fragment had to be approached from having the scope medially and coming through the lateral compartment with a shaver arthroscopic biter and ArthroCare wand  We were able to obtain a stable meniscal rim preserving the bulk of the root of the meniscus  After irrigation of the joint I removed the medial plica  Meniscal fragments were suctioned free with irrigation and meniscal shaver.  The portals were closed with 3-0 nylon suture.  The joint was injected with 30 cc of Marcaine with epinephrine  Sterile dressings and Cryo/Cuff were applied  She was then extubated and taken to recovery room in stable condition  Postop plan  Weightbearing as tolerated with a walker Brace will be given in the office for the tibial stress reaction Otherwise routine postop care

## 2019-07-10 NOTE — Brief Op Note (Signed)
07/10/2019  8:48 AM  PATIENT:  Jeanette Chang  79 y.o. 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 posterior horn medial meniscus tear with flipped fragment  ACL PCL normal  Lateral compartment normal  Chondromalacia patellofemoral joint    SURGEON:  Surgeon(s) and Role:    Carole Civil, MD - Primary  PHYSICIAN ASSISTANT:   ASSISTANTS: none   ANESTHESIA:   general  EBL:  5 mL   BLOOD ADMINISTERED:none  DRAINS: none   LOCAL MEDICATIONS USED:  MARCAINE     SPECIMEN:  No Specimen  DISPOSITION OF SPECIMEN:  N/A  COUNTS:  YES  TOURNIQUET:  * Missing tourniquet times found for documented tourniquets in log: [id number] *  DICTATION: .Dragon Dictation  PLAN OF CARE: Discharge to home after PACU  PATIENT DISPOSITION:  PACU - hemodynamically stable.   Delay start of Pharmacological VTE agent (>24hrs) due to surgical blood loss or risk of bleeding: not applicable  The surgery was done in the following manner  The patient was seen in preop and properly identified and chart review completed surgeon's initials marked over the left knee.  She was taken to the operating room for general anesthesia.  She was in the supine position.  We did use the lateral post.  After sterile prep and drape timeout was completed  The scope was placed in the joint through a lateral portal with diagnostic arthroscopy was performed.  She had a lot of synovitis around the joint showed a medial plica.  She had chondromalacia of the patellofemoral joint.  The notch area ACL PCL normal.  The lateral compartment showed free edge fraying of the  lateral meniscus without tear joint surfaces look normal  Medial compartment showed arthritis of the medial compartment with a torn medial meniscus and a flipped fragment over the superior portion of the posterior horn with a parrot-beak tear of the remaining meniscus at the junction of the posterior horn and body.  Through medial portal with a shaver was placed in the joint and the parrot-beak portion was resected.  However the flipped fragment had to be approached from having the scope medially and coming through the lateral compartment with a shaver arthroscopic biter and ArthroCare wand  We were able to obtain a stable meniscal rim preserving the bulk of the root of the meniscus  After irrigation of the joint I removed the medial plica  Meniscal fragments were suctioned free with irrigation and meniscal shaver.  The portals were closed with 3-0 nylon suture.  The joint was injected with 30 cc of Marcaine with epinephrine  Sterile dressings and Cryo/Cuff were applied  She was then extubated and taken to recovery room in stable condition  Postop plan  Weightbearing as tolerated with a walker Brace will be given in the office for the tibial stress reaction Otherwise routine postop care

## 2019-07-10 NOTE — Anesthesia Preprocedure Evaluation (Signed)
Anesthesia Evaluation  Patient identified by MRN, date of birth, ID band Patient awake    Reviewed: Allergy & Precautions, NPO status , Patient's Chart, lab work & pertinent test results, reviewed documented beta blocker date and time   Airway Mallampati: III  TM Distance: >3 FB Neck ROM: Full    Dental  (+) Chipped, Missing, Dental Advisory Given,    Pulmonary former smoker,    Pulmonary exam normal breath sounds clear to auscultation       Cardiovascular METS: 3 - Mets hypertension, Pt. on medications and Pt. on home beta blockers + Peripheral Vascular Disease (carotid artery stenosis)  Normal cardiovascular exam Rhythm:Regular Rate:Normal  06-Jul-2019 10:23:57 [facility] ROUTINE RECORD Normal sinus rhythm Low voltage QRS Nonspecific ST abnormality Abnormal ECG Confirmed by Carlyle Dolly [phone]) on 07/08/2019 3:11:09 PM   Neuro/Psych  Headaches,    GI/Hepatic negative GI ROS, (+) Cirrhosis  (nonalcoholic)      , Hepatitis -  Endo/Other  diabetes, Well Controlled, Type 2, Oral Hypoglycemic Agents  Renal/GU      Musculoskeletal  (+) Arthritis , Osteoarthritis,  Chronic left knee pain   Abdominal   Peds  Hematology  (+) Blood dyscrasia (thrombocytopenia), anemia ,   Anesthesia Other Findings   Reproductive/Obstetrics                             Anesthesia Physical Anesthesia Plan  ASA: III  Anesthesia Plan: General   Post-op Pain Management:    Induction: Intravenous  PONV Risk Score and Plan: 4 or greater and Ondansetron  Airway Management Planned: LMA  Additional Equipment:   Intra-op Plan:   Post-operative Plan: Extubation in OR  Informed Consent: I have reviewed the patients History and Physical, chart, labs and discussed the procedure including the risks, benefits and alternatives for the proposed anesthesia with the patient or authorized  representative who has indicated his/her understanding and acceptance.     Dental advisory given  Plan Discussed with: CRNA and Surgeon  Anesthesia Plan Comments:         Anesthesia Quick Evaluation

## 2019-07-10 NOTE — Anesthesia Postprocedure Evaluation (Signed)
Anesthesia Post Note  Patient: Anagha Loseke  Procedure(s) Performed: KNEE ARTHROSCOPY WITH MEDIAL MENISCECTOMY (Left Knee)  Patient location during evaluation: PACU Anesthesia Type: General Level of consciousness: awake, oriented, awake and alert, patient cooperative and responds to stimulation Pain management: pain level controlled Vital Signs Assessment: vitals unstable and post-procedure vital signs reviewed and stable Respiratory status: spontaneous breathing, nonlabored ventilation and respiratory function stable Cardiovascular status: blood pressure returned to baseline and stable Postop Assessment: no headache and no backache Anesthetic complications: no     Last Vitals:  Vitals:   07/10/19 0715 07/10/19 0720  BP: (!) 160/58   Pulse: 71 69  Resp: 13 10  Temp:    SpO2: 96% 93%    Last Pain:  Vitals:   07/10/19 0645  TempSrc: Oral  PainSc: 0-No pain                 Tacy Learn

## 2019-07-10 NOTE — Transfer of Care (Signed)
Immediate Anesthesia Transfer of Care Note  Patient: Jeanette Chang  Procedure(s) Performed: KNEE ARTHROSCOPY WITH MEDIAL MENISCECTOMY (Left Knee)  Patient Location: PACU  Anesthesia Type:General  Level of Consciousness: awake, alert , oriented, patient cooperative and responds to stimulation  Airway & Oxygen Therapy: Patient Spontanous Breathing  Post-op Assessment: Report given to RN, Post -op Vital signs reviewed and stable, Patient moving all extremities X 4 and Patient able to stick tongue midline  Post vital signs: Reviewed and stable  Last Vitals:  Vitals Value Taken Time  BP    Temp    Pulse 82 07/10/19 0849  Resp 22 07/10/19 0849  SpO2 95 % 07/10/19 0849  Vitals shown include unvalidated device data.  Last Pain:  Vitals:   07/10/19 0645  TempSrc: Oral  PainSc: 0-No pain      Patients Stated Pain Goal: 7 (95/07/22 5750)  Complications: No apparent anesthesia complications

## 2019-07-10 NOTE — Interval H&P Note (Signed)
History and Physical Interval Note:  07/10/2019 7:31 AM  Jeanette Chang  has presented today for surgery, with the diagnosis of Left medial meniscus tear.  The various methods of treatment have been discussed with the patient and family. After consideration of risks, benefits and other options for treatment, the patient has consented to  Procedure(s): KNEE ARTHROSCOPY WITH MEDIAL MENISCECTOMY (Left) as a surgical intervention.  The patient's history has been reviewed, patient examined, no change in status, stable for surgery.  I have reviewed the patient's chart and labs.  Questions were answered to the patient's satisfaction.     Arther Abbott

## 2019-07-10 NOTE — H&P (Signed)
Chief Complaint  Patient presents with  . Knee Pain    left    Nico presents to [place] at the request of Dr. Luna Glasgow for evaluation for possible surgery for torn medial meniscus and a stress fracture of the medial tibial plateau  Patient reports pain since October 2020 she has had the knee aspirated and injected on several occasions without relief. She has a history of anemia cirrhosis and diabetes, scheduled for colonoscopy and endoscopy  She also has a history of cirrhosis of the liver  She complains of pain on the medial joint with a burning sensation over the proximal medial tibia  She has tried some activity modification but does not use the walker on the consistent basis  She had an injection on Wednesday and had increased pain she said within hours of the shot   Review of Systems  Constitutional: Positive for malaise/fatigue.  Respiratory: Negative for shortness of breath.  Cardiovascular: Negative for chest pain.  Endo/Heme/Allergies: Does not bruise/bleed easily.  All other systems reviewed and are negative.       Past Medical History:  Diagnosis Date  . Abnormal LFTs   . Achilles bursitis or tendinitis   . Acute maxillary sinusitis   . Acute pharyngitis   . Anemia, unspecified   . Candidiasis of skin and nails   . Dermatophytosis of foot   . Dermatophytosis of the body   . Diverticulitis of colon (without mention of hemorrhage)(562.11)   . Edema   . Headache(784.0)   . Malaise and fatigue   . 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 Osteoarthrosis, unspecified whether generalized or localized, unspecified site   . Other dyspnea and respiratory abnormality   . Other psoriasis   . Other specified disorders of rotator cuff syndrome of shoulder and allied disorders   . Pain in joint   . Pure hypercholesterolemia   . RUQ pain   . Spasm of back muscles   . Thrombocytopenia, unspecified ([place])   . Type II or unspecified type  diabetes mellitus without mention of complication, not stated as uncontrolled   . Unspecified essential hypertension   . Unspecified sinusitis (chronic)   . Urinary incontinence         Past Surgical History:  Procedure Laterality Date  . APPENDECTOMY  1964  . BIOPSY  03/02/2017   Procedure: BIOPSY; Surgeon: Rogene Houston, MD; Location[facility]; Service: Endoscopy;; antral  . CAROTID ENDARTERECTOMY  2005   Left CEA  . CAROTID ENDARTERECTOMY  2009   Right CEA  . CHOLECYSTECTOMY  1982   Gall Bladder  . COLONOSCOPY  09  . ESOPHAGOGASTRODUODENOSCOPY N/A 03/02/2017   Procedure: ESOPHAGOGASTRODUODENOSCOPY (EGD); Surgeon: Rogene Houston, MD; Location[facility]; Service: Endoscopy; Laterality: N/A; 12:55  . NOSE SURGERY  1978  . PARATHYROIDECTOMY  1992  . POLYPECTOMY  03/02/2017   Procedure: POLYPECTOMY; Surgeon: Rogene Houston, MD; Location[facility]; Service: Endoscopy;; gastric   . Power port  03/06/2009   Right upper chest   . TONSILLECTOMY    . TUMOR EXCISION Right August 24, 2013   [facility] Dr. Ellen Henri, ENT  . VAGINAL HYSTERECTOMY  1972        Family History  Problem Relation Age of Onset  . Heart attack Mother   . Cancer Mother   . Heart attack Father   . Cancer Sister    Social History  Tobacco Use  . Smoking status: Former Smoker    Types: Cigarettes  . Smokeless tobacco: Former Systems developer    Quit date: 12/16/1992  Substance Use Topics  . Alcohol use: Yes    Alcohol/week: 1.0 standard drinks    Types: 1 Glasses of wine per week    Comment: very seldom  . Drug use: No        Allergies  Allergen Reactions  . Codeine Nausea And Vomiting and Rash  . Monascus Purpureus Went Yeast Other (See Comments)    Headaches, myalgias  . Niacin And Related Other (See Comments)    Headaches, myalgias  . Red Yeast Rice Other (See Comments)    Headaches, myalgias  . Actos [Pioglitazone Hydrochloride] Other (See Comments)     Severe headache  . Amaryl Nausea Only and Other (See Comments)    Severe headache  . Iron Diarrhea  . Sanctura [Trospium Chloride] Nausea Only and Other (See Comments)    Severe headache  . Statins Other (See Comments)    Severe headache, Hips and leg weakness that cause patient not to be able to function as per patient.  Lisbeth Ply [Fesoterodine Fumarate] Other (See Comments)    Cramps, severe headache  . Norco [Hydrocodone-Acetaminophen] Nausea And Vomiting and Anxiety  . Penicillins Swelling, Rash and Other (See Comments)    Has patient had a PCN reaction causing immediate rash, facial/tongue/throat swelling, SOB or lightheadedness with hypotension: No  Has patient had a PCN reaction causing severe rash involving mucus membranes or skin necrosis: No  Has patient had a PCN reaction that required hospitalization: Yes  Has patient had a PCN reaction occurring within the last 10 years: No  If all of the above answers are "NO", then may proceed with Cephalosporin use.   Marland Kitchen Ultram [Tramadol Hcl] Nausea And Vomiting   Active Medications                                                                     BP (!) 152/66  Pulse 67  Ht 5' 3"  (1.6 m)  Wt 132 lb (59.9 kg)  BMI 23.38 kg/m  Physical Exam  She walked in today with no assistive devices her mood is pleasant her affect is normal she is oriented x3 her parents showed no gross deformities she was well groomed  Ortho Exam  She had tenderness over the proximal medial tibia there is no joint effusion range of motion arc was 125 degrees her knee was stable in all planes and muscle strength and tone was normal skin was intact pulses were good distally there was no edema and sensation was normal  MEDICAL DECISION SECTION  xrays ordered? No  My independent reading of xrays:  X-rays were done at her primary care physician's office she has symmetric joint space narrowing without any secondary bone changes I would grade this is mild  arthritis  She also had an MRI which is in the medical record and that shows that she does have a torn meniscus  She has a large stress reaction of the medial tibial plateau on the lateral MRI involves the the entire plateau from front to back she also has a complex tear of the medial meniscus with a flipped fragment  Encounter Diagnoses  Name Primary?  . Chronic pain of left knee Yes  . 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    PLAN:  Primary care doctor: Dr. Brigitte Pulse   Alternative treatments: Continue with nonoperative care such as but not limited to Tylenol anti-inflammatories tramadol topical medications bracing chronic pain management  Patient factors that increase risk: Diabetes and cirrhosis anemia   Pain management: Medication will be tapered off at 6 weeks an alternative measures will be needed to control pain  Postoperatively:  Surgical procedure planned: She is agreeable to arthroscopy of the left knee and partial medial meniscectomy and then a brace and walker for at least 8 weeks she is advised to use a walker until I tell her she cannot use it or does not need it anymore starting today  The procedure has been fully reviewed with the patient; The risks and benefits of surgery have been discussed and explained and understood. Alternative treatment has also been reviewed, questions were encouraged and answered. The postoperative plan is also been reviewed.  Nonsurgical treatment as described in the history and physical section was attempted and unsuccessful and the patient has agreed to proceed with surgical intervention to improve their situation.

## 2019-07-16 DIAGNOSIS — D509 Iron deficiency anemia, unspecified: Secondary | ICD-10-CM | POA: Diagnosis not present

## 2019-07-16 DIAGNOSIS — I10 Essential (primary) hypertension: Secondary | ICD-10-CM | POA: Diagnosis not present

## 2019-07-18 ENCOUNTER — Ambulatory Visit (INDEPENDENT_AMBULATORY_CARE_PROVIDER_SITE_OTHER): Payer: Medicare Other | Admitting: Orthopedic Surgery

## 2019-07-18 ENCOUNTER — Other Ambulatory Visit: Payer: Self-pay

## 2019-07-18 DIAGNOSIS — G8929 Other chronic pain: Secondary | ICD-10-CM

## 2019-07-18 DIAGNOSIS — M1710 Unilateral primary osteoarthritis, unspecified knee: Secondary | ICD-10-CM

## 2019-07-18 DIAGNOSIS — M25562 Pain in left knee: Secondary | ICD-10-CM

## 2019-07-18 DIAGNOSIS — Z9889 Other specified postprocedural states: Secondary | ICD-10-CM

## 2019-07-18 NOTE — Patient Instructions (Addendum)
CONTINUE ICE 4 X A DAY   DO YOUR EXERCISES   TAKE TYLENOL FOR PAIN   WEAR BRACE WHEN WALKING

## 2019-07-18 NOTE — Progress Notes (Signed)
Patient ID: Jeanette Chang, female   DOB: [birth year], 79 y.o.   MRN: [id number]  Chief Complaint  Patient presents with  . Knee Pain    L/DOS 07/10/19/ still having some pain   Encounter Diagnoses  Name Primary?  . S/P left knee arthroscopy 07/10/19 Yes  . Chronic pain of left knee   . Primary localized osteoarthritis of knee      Current Outpatient Medications:  .  acetaminophen (TYLENOL) 500 MG tablet, Take 1 tablet (500 mg total) by mouth every 6 (six) hours as needed for moderate pain or headache., Disp: 30 tablet, Rfl: 0 .  aspirin EC 81 MG tablet, Take 1 tablet (81 mg total) by mouth every evening., Disp:  , Rfl:  .  atenolol (TENORMIN) 50 MG tablet, Take 50 mg by mouth at bedtime. , Disp: , Rfl:  .  Cholecalciferol (VITAMIN D3) 50 MCG (2000 UT) TABS, Take 2,000 Units by mouth every evening., Disp: , Rfl:  .  cyanocobalamin (,VITAMIN B-12,) 1000 MCG/ML injection, Inject 1,000 mcg into the muscle every 30 (thirty) days., Disp: , Rfl:  .  dapagliflozin propanediol (FARXIGA) 10 MG TABS tablet, Take 10 mg by mouth daily. , Disp: , Rfl:  .  glimepiride (AMARYL) 2 MG tablet, Take 2 mg by mouth daily., Disp: , Rfl:  .  ibuprofen (ADVIL) 800 MG tablet, Take 1 tablet (800 mg total) by mouth every 8 (eight) hours as needed., Disp: 30 tablet, Rfl: 0 .  lisinopril (PRINIVIL,ZESTRIL) 40 MG tablet, Take 40 mg by mouth daily., Disp: , Rfl:  .  metFORMIN (GLUCOPHAGE) 500 MG tablet, Take 1,000 mg by mouth 2 (two) times daily with a meal. , Disp: , Rfl:  .  promethazine (PHENERGAN) 12.5 MG tablet, Take 1 tablet (12.5 mg total) by mouth every 6 (six) hours as needed for nausea or vomiting., Disp: 30 tablet, Rfl: 0 .  vitamin B-12 (CYANOCOBALAMIN) 1000 MCG tablet, Take 1,000 mcg by mouth daily., Disp: , Rfl:  .  ZINC OXIDE EX, Apply 1 application topically 3 (three) times daily as needed (psoriasis flares). , Disp: , Rfl:    The patient is status post knee arthroscopy as described. They're doing well  without any major complaints. Pain is well controlled. 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

## 2019-07-19 NOTE — Addendum Note (Signed)
Addendum  created 07/19/19 1241 by Ollen Bowl, CRNA   Charge Capture section accepted

## 2019-07-20 ENCOUNTER — Ambulatory Visit: Payer: Medicare Other | Admitting: Orthopedic Surgery

## 2019-08-09 ENCOUNTER — Other Ambulatory Visit: Payer: Self-pay

## 2019-08-09 ENCOUNTER — Encounter: Payer: Self-pay | Admitting: Orthopedic Surgery

## 2019-08-09 ENCOUNTER — Ambulatory Visit (INDEPENDENT_AMBULATORY_CARE_PROVIDER_SITE_OTHER): Payer: Medicare Other | Admitting: Orthopedic Surgery

## 2019-08-09 DIAGNOSIS — Z9889 Other specified postprocedural states: Secondary | ICD-10-CM

## 2019-08-09 NOTE — Progress Notes (Signed)
Chief Complaint  Patient presents with  . Post-op Follow-up    07/10/19 left knee has soreness at portal site lateral    79 year old female with a stress reaction medial tibia and then a meniscectomy comes in says she is feeling better she did have some swelling on the lateral portal site  Portal site is swollen some tender a little bit appears to be a small joint effusion she has regained most of her motion she has some tenderness over the medial tibial plateau  Recommend that she continue to wear the hinged knee brace to unload the plateau and help the stress reaction heal and we will monitor the swelling over the lateral portal  Encounter Diagnosis  Name Primary?  . 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

## 2019-08-09 NOTE — Patient Instructions (Signed)
Brace x 4 weeks   Fu in 6 weeks

## 2019-09-06 ENCOUNTER — Encounter (INDEPENDENT_AMBULATORY_CARE_PROVIDER_SITE_OTHER): Payer: Self-pay | Admitting: *Deleted

## 2019-09-11 ENCOUNTER — Other Ambulatory Visit (INDEPENDENT_AMBULATORY_CARE_PROVIDER_SITE_OTHER): Payer: Self-pay | Admitting: *Deleted

## 2019-09-11 DIAGNOSIS — K7469 Other cirrhosis of liver: Secondary | ICD-10-CM

## 2019-09-17 DIAGNOSIS — E119 Type 2 diabetes mellitus without complications: Secondary | ICD-10-CM | POA: Diagnosis not present

## 2019-09-17 DIAGNOSIS — H353132 Nonexudative age-related macular degeneration, bilateral, intermediate dry stage: Secondary | ICD-10-CM | POA: Diagnosis not present

## 2019-09-19 DIAGNOSIS — M1710 Unilateral primary osteoarthritis, unspecified knee: Secondary | ICD-10-CM | POA: Diagnosis not present

## 2019-09-19 DIAGNOSIS — R609 Edema, unspecified: Secondary | ICD-10-CM | POA: Diagnosis not present

## 2019-09-19 DIAGNOSIS — I10 Essential (primary) hypertension: Secondary | ICD-10-CM | POA: Diagnosis not present

## 2019-09-19 DIAGNOSIS — Z299 Encounter for prophylactic measures, unspecified: Secondary | ICD-10-CM | POA: Diagnosis not present

## 2019-09-19 DIAGNOSIS — E1165 Type 2 diabetes mellitus with hyperglycemia: Secondary | ICD-10-CM | POA: Diagnosis not present

## 2019-09-20 ENCOUNTER — Ambulatory Visit (INDEPENDENT_AMBULATORY_CARE_PROVIDER_SITE_OTHER): Payer: Medicare Other | Admitting: Orthopedic Surgery

## 2019-09-20 ENCOUNTER — Ambulatory Visit: Payer: Medicare Other

## 2019-09-20 ENCOUNTER — Other Ambulatory Visit: Payer: Self-pay

## 2019-09-20 ENCOUNTER — Encounter: Payer: Self-pay | Admitting: Orthopedic Surgery

## 2019-09-20 DIAGNOSIS — Z9889 Other specified postprocedural states: Secondary | ICD-10-CM | POA: Diagnosis not present

## 2019-09-20 DIAGNOSIS — M25562 Pain in left knee: Secondary | ICD-10-CM

## 2019-09-20 DIAGNOSIS — G8929 Other chronic pain: Secondary | ICD-10-CM | POA: Diagnosis not present

## 2019-09-20 NOTE — Patient Instructions (Addendum)
Wear your knee brace at all times except when you are in bed, that will help your knee pain. This will help the stress fracture you have. Your pain is directly over the stress fracture.  Use the cane too for the next 6 weeks that will help you heal the fracture.

## 2019-09-20 NOTE — Progress Notes (Signed)
Chief Complaint  Patient presents with  . Knee Pain    constant medial left knee pain "driving me crazy"  . Routine Post Op    07/10/19 SALK    Encounter Diagnoses  Name Primary?  . S/P left knee arthroscopy 07/10/19   . Chronic pain of left knee Yes   79 year old female status post arthroscopy medial meniscectomy she also had a preop stress reaction of the medial tibial plateau comes in complaining of medial tibial bone pain especially with weightbearing we will place an economy hinged brace but she is not wearing it she is not using any supportive devices  preop MRI   MRI which is in the medical record and that shows that she does have a torn meniscus  She has a large stress reaction of the medial tibial plateau on the lateral MRI involves the the entire plateau from front to back she also has a complex tear of the medial meniscus with a flipped fragment   Examination reveals slight decrease in extension her flexion arc is good no effusion tenderness over the proximal medial plateau  Repeat x-rays were done days show no acute fracture  Exam shows tenderness over the medial joint line medial plateau somewhat over the pes tendons hard to localize range of motion seems fine except for the lack in extension  Recommend bracing and cane for 6 weeks and then follow-up for repeat examination may need to check some vitamin D levels.

## 2019-09-28 DIAGNOSIS — D500 Iron deficiency anemia secondary to blood loss (chronic): Secondary | ICD-10-CM | POA: Diagnosis not present

## 2019-09-28 DIAGNOSIS — R5382 Chronic fatigue, unspecified: Secondary | ICD-10-CM | POA: Diagnosis not present

## 2019-09-28 DIAGNOSIS — D72819 Decreased white blood cell count, unspecified: Secondary | ICD-10-CM | POA: Diagnosis not present

## 2019-09-28 DIAGNOSIS — D696 Thrombocytopenia, unspecified: Secondary | ICD-10-CM | POA: Diagnosis not present

## 2019-09-28 DIAGNOSIS — K7460 Unspecified cirrhosis of liver: Secondary | ICD-10-CM | POA: Diagnosis not present

## 2019-09-28 DIAGNOSIS — D508 Other iron deficiency anemias: Secondary | ICD-10-CM | POA: Diagnosis not present

## 2019-09-28 DIAGNOSIS — E538 Deficiency of other specified B group vitamins: Secondary | ICD-10-CM | POA: Diagnosis not present

## 2019-10-01 ENCOUNTER — Ambulatory Visit (INDEPENDENT_AMBULATORY_CARE_PROVIDER_SITE_OTHER): Payer: Medicare Other | Admitting: Gastroenterology

## 2019-10-01 DIAGNOSIS — K7460 Unspecified cirrhosis of liver: Secondary | ICD-10-CM | POA: Diagnosis not present

## 2019-10-01 DIAGNOSIS — D696 Thrombocytopenia, unspecified: Secondary | ICD-10-CM | POA: Diagnosis not present

## 2019-10-01 DIAGNOSIS — D732 Chronic congestive splenomegaly: Secondary | ICD-10-CM | POA: Diagnosis not present

## 2019-10-01 DIAGNOSIS — E538 Deficiency of other specified B group vitamins: Secondary | ICD-10-CM | POA: Diagnosis not present

## 2019-10-01 DIAGNOSIS — D500 Iron deficiency anemia secondary to blood loss (chronic): Secondary | ICD-10-CM | POA: Diagnosis not present

## 2019-10-01 DIAGNOSIS — Z6825 Body mass index (BMI) 25.0-25.9, adult: Secondary | ICD-10-CM | POA: Diagnosis not present

## 2019-10-04 DIAGNOSIS — D500 Iron deficiency anemia secondary to blood loss (chronic): Secondary | ICD-10-CM | POA: Diagnosis not present

## 2019-10-08 ENCOUNTER — Ambulatory Visit (HOSPITAL_COMMUNITY)
Admission: RE | Admit: 2019-10-08 | Discharge: 2019-10-08 | Disposition: A | Discharge status: Home / Self Care | Payer: Medicare Other | Source: Ambulatory Visit | Attending: Internal Medicine | Admitting: Internal Medicine

## 2019-10-08 ENCOUNTER — Other Ambulatory Visit: Payer: Self-pay

## 2019-10-08 DIAGNOSIS — K7469 Other cirrhosis of liver: Secondary | ICD-10-CM | POA: Diagnosis not present

## 2019-10-08 DIAGNOSIS — K7460 Unspecified cirrhosis of liver: Secondary | ICD-10-CM | POA: Diagnosis not present

## 2019-10-08 DIAGNOSIS — I10 Essential (primary) hypertension: Secondary | ICD-10-CM | POA: Diagnosis not present

## 2019-10-08 DIAGNOSIS — R161 Splenomegaly, not elsewhere classified: Secondary | ICD-10-CM | POA: Diagnosis not present

## 2019-10-08 DIAGNOSIS — Z9049 Acquired absence of other specified parts of digestive tract: Secondary | ICD-10-CM | POA: Diagnosis not present

## 2019-10-08 DIAGNOSIS — N1330 Unspecified hydronephrosis: Secondary | ICD-10-CM | POA: Diagnosis not present

## 2019-10-08 IMAGING — US US ABDOMEN COMPLETE
1 series · 13 of 25 positions shown · non-contrast
Comparison: [DATE]

CLINICAL DATA: Cirrhosis, type II diabetes mellitus, hypertension

EXAM:
ABDOMEN ULTRASOUND COMPLETE

[Series 1: us abdomen complete · 13 of 117 slices shown]
[im 1/117]
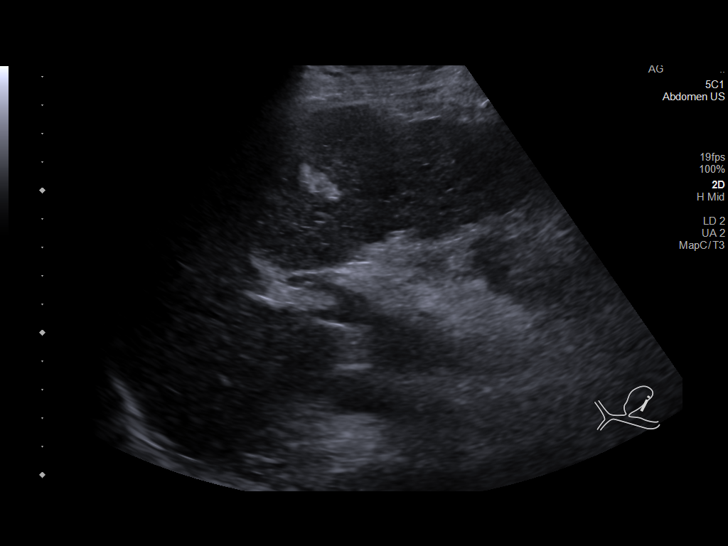
[im 10/117]
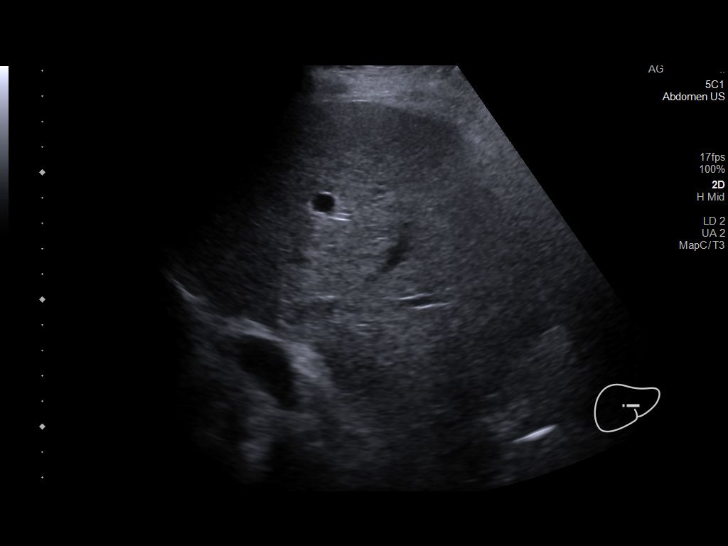
[im 20/117]
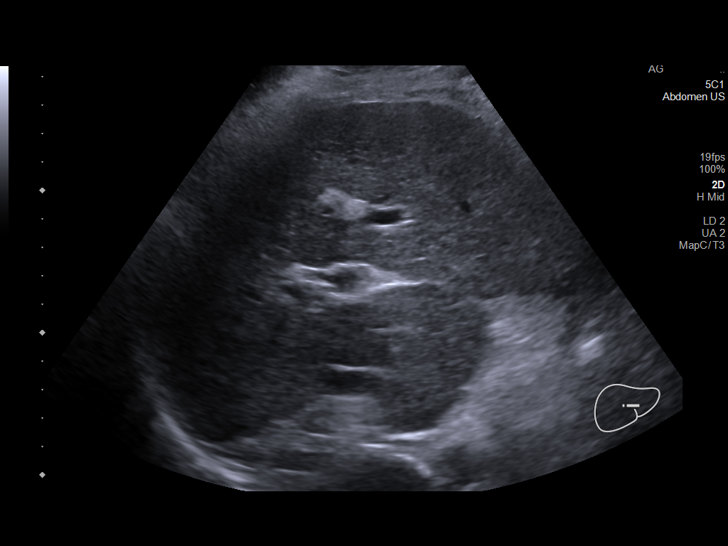
[im 30/117]
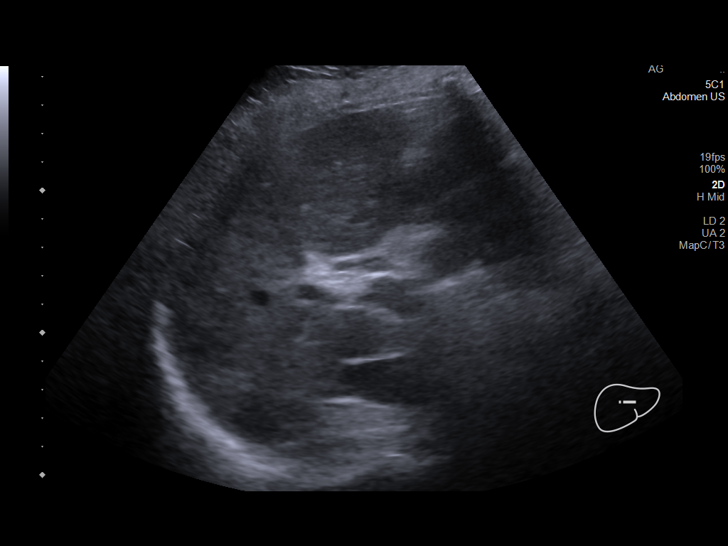
[im 39/117]
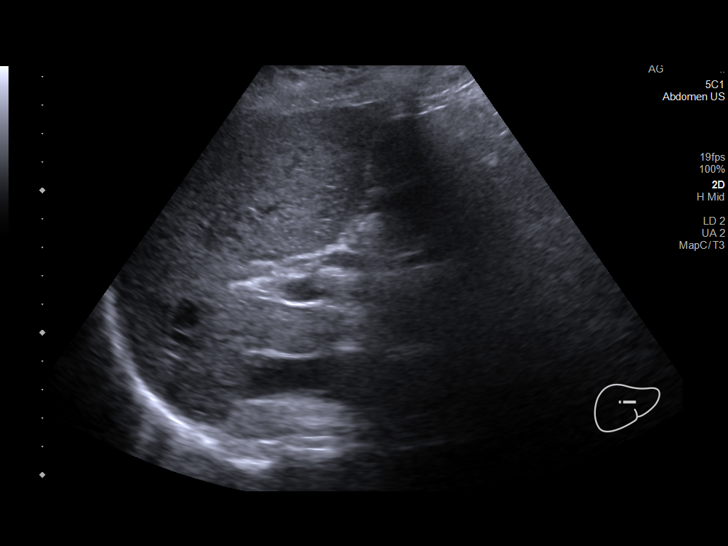
[im 49/117]
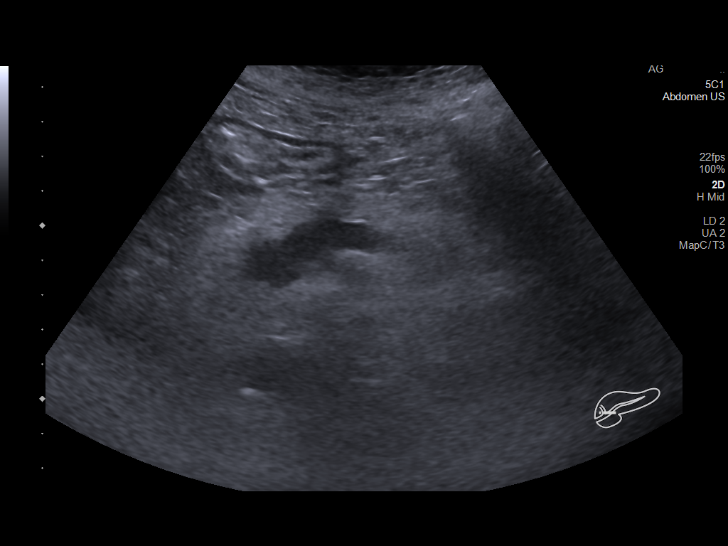
[im 59/117]
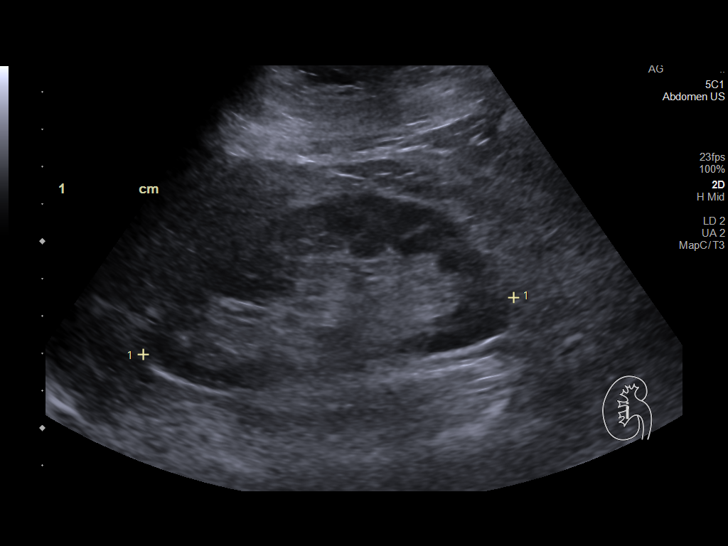
[im 68/117]
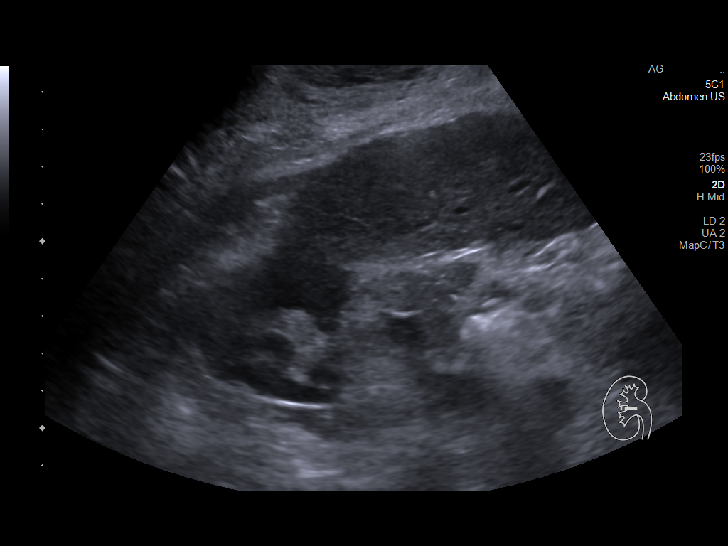
[im 78/117]
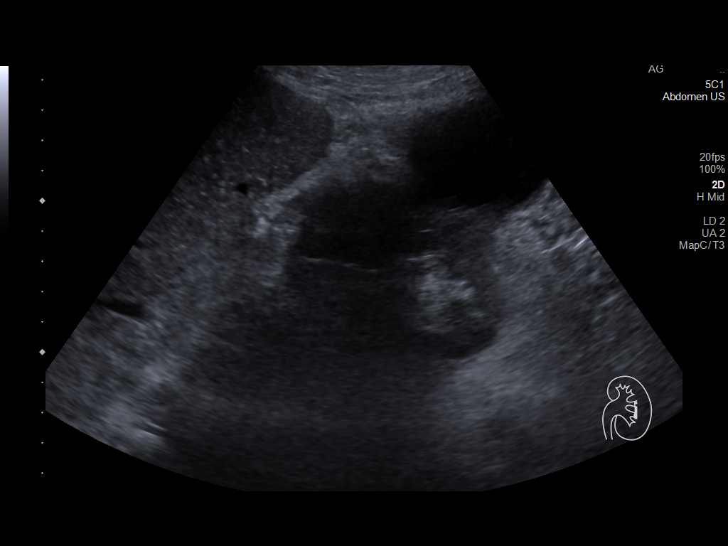
[im 88/117]
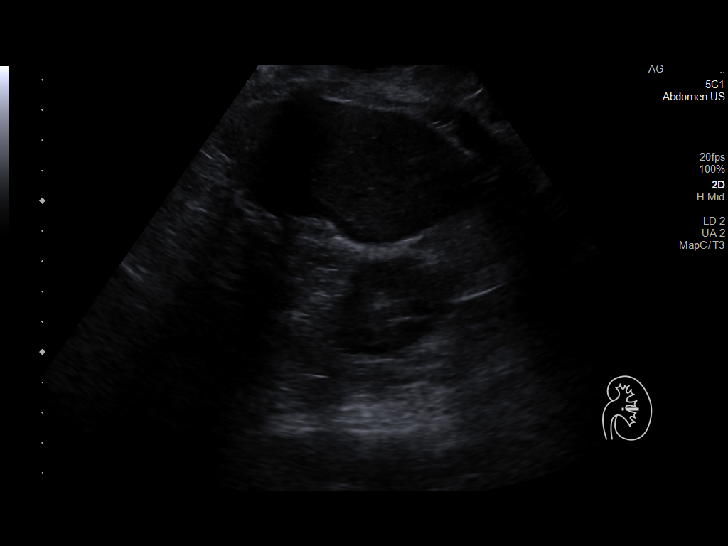
[im 97/117]
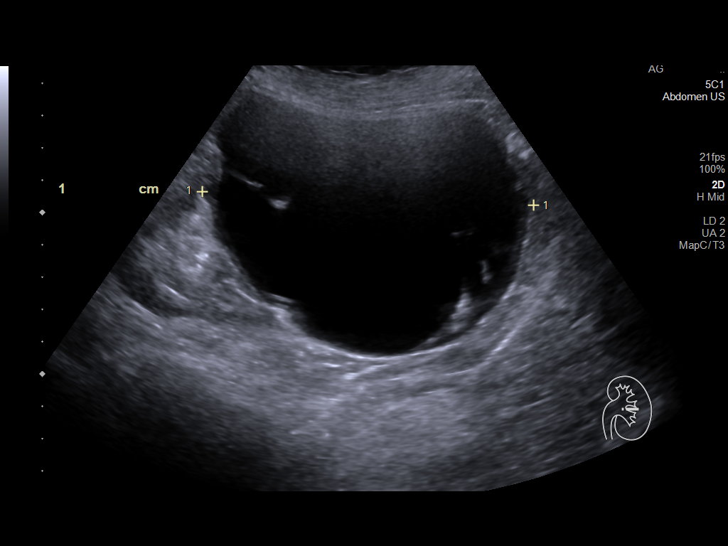
[im 107/117]
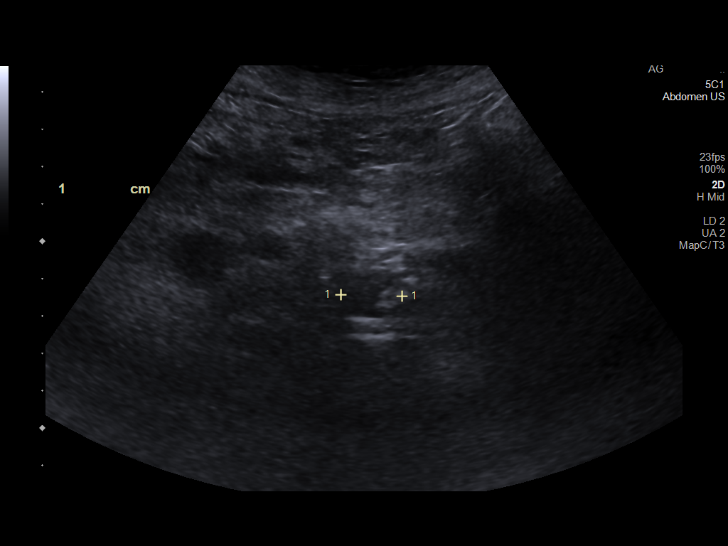
[im 117/117]
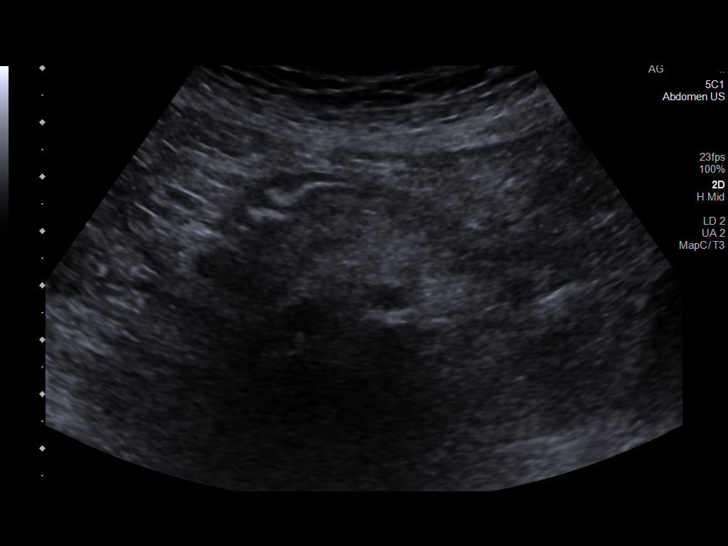

[13 of 25 positions shown; findings below may reference images not displayed]

FINDINGS: Gallbladder: Surgically absent

Common bile duct: Diameter: 6 mm, normal post cholecystectomy

Liver: Coarsened heterogeneous increased echogenicity of the liver
with nodular margins consistent with cirrhosis. No discrete mass or
intrahepatic biliary dilatation. Portal vein is patent on color
Doppler imaging with normal direction of blood flow towards the
liver.

IVC: Normal appearance

Pancreas: Normal appearance

Spleen: Normal echogenicity without mass. Enlarged, 12.5 cm length,
calculated volume 580 mL.

Right Kidney: Length: 10.0 cm. Mild cortical thinning without mass
or hydronephrosis.

Left Kidney: Length: 10.5 cm. Cortical thinning without
hydronephrosis. Complicated cyst at inferior pole 13.2 x 6.5 x
cm containing partial septations and a minimally irregular wall,
previously 12.7 x 8.8 x 11.3 cm, volume slightly smaller.

Abdominal aorta: Atherosclerotic changes of abdominal aorta. Mild
fusiform enlargement of the distal abdominal aorta measuring up to
2.1 x 2.3 cm, with mid aorta 1.6 x 1.6 cm.

Other findings: No free fluid
IMPRESSION: Cirrhotic appearing liver with mild splenomegaly.

Slight decrease in volume of a large mildly complicated cyst at the
inferior pole the LEFT kidney.

Post cholecystectomy.

Atherosclerotic changes of the abdominal aorta with slight fusiform
enlargement of the distal abdominal aorta versus mid aorta but only
achieving a maximal diameter of 2.3 x 2.1 cm, not significantly
changed; recommend follow-up imaging in 3 years to assess stability.

## 2019-10-10 DIAGNOSIS — D509 Iron deficiency anemia, unspecified: Secondary | ICD-10-CM | POA: Diagnosis not present

## 2019-10-11 ENCOUNTER — Encounter (INDEPENDENT_AMBULATORY_CARE_PROVIDER_SITE_OTHER): Payer: Self-pay | Admitting: Gastroenterology

## 2019-10-11 ENCOUNTER — Other Ambulatory Visit: Payer: Self-pay

## 2019-10-11 ENCOUNTER — Ambulatory Visit (INDEPENDENT_AMBULATORY_CARE_PROVIDER_SITE_OTHER): Payer: Medicare Other | Admitting: Gastroenterology

## 2019-10-11 VITALS — BP 112/62 | HR 76 | Temp 97.9°F | Ht 63.0 in | Wt 135.1 lb

## 2019-10-11 DIAGNOSIS — K7460 Unspecified cirrhosis of liver: Secondary | ICD-10-CM | POA: Diagnosis not present

## 2019-10-11 DIAGNOSIS — K7581 Nonalcoholic steatohepatitis (NASH): Secondary | ICD-10-CM

## 2019-10-11 NOTE — Progress Notes (Signed)
Patient profile: Jeanette Chang is a 79 y.o. female seen for f/up. Last seen in clinic on May 2021 for endoscopy colonoscopy.  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  Also has history of iron deficiency anemia.  History of Present Illness: Jeanette Chang is seen today for follow-up.  She overall reports her symptoms are at her baseline.  She continues to have diarrhea which has been ongoing for several years and most problematic if she eats eggs, orange juice or eats out.  She generally just avoids eating out and does fairly well.  She denies any blood in stool or black stool.  She only has abdominal pain if she eats the known trigger foods.  She denies any recent change in her symptoms.  Denies any nausea, vomiting, GERD, epigastric pain.  She takes ibuprofen very occasionally 2-3 times a month for knee swelling.  She does use Tylenol 2-3 times a week.  Stopped smoking 1994.  Minimal alcohol once a month.    Wt Readings from Last 3 Encounters:  10/11/19 135 lb 1.6 oz (61.3 kg)  07/06/19 133 lb (60.3 kg)  06/27/19 125 lb (56.7 kg)    Past Medical History:  Past Medical History:  Diagnosis Date  . Abnormal LFTs   . Achilles bursitis or tendinitis   . Acute maxillary sinusitis   . Acute pharyngitis   . Anemia, unspecified   . Candidiasis of skin and nails   . Dermatophytosis of foot   . Dermatophytosis of the body   . Diverticulitis of colon (without mention of hemorrhage)(562.11)   . Edema   . Headache(784.0)   . Malaise and fatigue   . 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 Osteoarthrosis, unspecified whether generalized or localized, unspecified site   . Other dyspnea and respiratory abnormality   . Other psoriasis   . Other specified disorders of rotator cuff syndrome of shoulder and allied disorders   . Pain in joint   . Pure hypercholesterolemia   . RUQ pain   . Spasm of back muscles   .  Thrombocytopenia, unspecified ([place])   . Type II or unspecified type diabetes mellitus without mention of complication, not stated as uncontrolled   . Unspecified essential hypertension   . Unspecified sinusitis (chronic)   . Urinary incontinence     Problem List: Patient Active Problem List   Diagnosis Date Noted  . S/P left knee arthroscopy 07/10/19 07/18/2019  . Complex tear of medial meniscus of left knee as current injury   . Chronic pain of left knee 06/15/2019  . 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 06/15/2019  . High priority for 2019-nCoV vaccine 03/12/2019  . Liver cirrhosis secondary to NASH (nonalcoholic steatohepatitis) ([place]) 10/03/2018  . Caregiver stress 07/27/2017  . Absolute anemia 02/25/2017  . B12 deficiency 12/19/2016  . Disease of liver 03/06/2015  . 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 Diabetes ([place]) 03/06/2015  . BP (high blood pressure) 03/06/2015  . Anemia, iron deficiency 03/06/2015  . CFIDS (chronic fatigue and immune dysfunction syndrome) ([place]) 01/02/2015  . Arthritis 01/02/2015  . 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 Aftercare following surgery of the circulatory system, [place] 10/02/2013  . Occlusion and stenosis of carotid artery without mention of cerebral infarction 08/18/2011  . Change in blood platelet count 03/04/2006  . Thrombocytopenia ([place]) 03/04/2006    Past Surgical History: Past Surgical History:  Procedure Laterality Date  .  APPENDECTOMY  1964  . BIOPSY  03/02/2017   Procedure: BIOPSY;  Surgeon: Rogene Houston, MD;  [facility];  Service: Endoscopy;;  antral  . CAROTID ENDARTERECTOMY  2005   Left CEA  . CAROTID ENDARTERECTOMY  2009   Right CEA  . CHOLECYSTECTOMY  1982   Gall Bladder  . COLONOSCOPY  09  . COLONOSCOPY N/A 06/27/2019   Procedure: COLONOSCOPY;  Surgeon: Rogene Houston, MD;  [facility];  Service: Endoscopy;  Laterality: N/A;  . ESOPHAGOGASTRODUODENOSCOPY N/A 03/02/2017     Procedure: ESOPHAGOGASTRODUODENOSCOPY (EGD);  Surgeon: Rogene Houston, MD;  [facility];  Service: Endoscopy;  Laterality: N/A;  12:55  . ESOPHAGOGASTRODUODENOSCOPY N/A 06/27/2019   Procedure: ESOPHAGOGASTRODUODENOSCOPY (EGD);  Surgeon: Rogene Houston, MD;  [facility];  Service: Endoscopy;  Laterality: N/A;  730  . KNEE ARTHROSCOPY WITH MEDIAL MENISECTOMY Left 07/10/2019   Procedure: KNEE ARTHROSCOPY WITH MEDIAL MENISCECTOMY;  Surgeon: Carole Civil, MD;  Location: [facility];  Service: Orthopedics;  Laterality: Left;  . NOSE SURGERY  1978  . PARATHYROIDECTOMY  1992  . POLYPECTOMY  03/02/2017   Procedure: POLYPECTOMY;  Surgeon: Rogene Houston, MD;  [facility];  Service: Endoscopy;;  gastric   . POLYPECTOMY  06/27/2019   Procedure: POLYPECTOMY;  Surgeon: Rogene Houston, MD;  [facility];  Service: Endoscopy;;  colon  . Power port  03/06/2009   Right upper chest   . TONSILLECTOMY    . TUMOR EXCISION Right August 24, 2013   [facility] Dr. Ellen Henri, ENT  . VAGINAL HYSTERECTOMY  1972    Allergies: Allergies  Allergen Reactions  . Codeine Nausea And Vomiting and Rash  . Monascus Purpureus Went Yeast Other (See Comments)    Headaches, myalgias  . Niacin And Related Other (See Comments)    Headaches, myalgias  . Red Yeast Rice Other (See Comments)    Headaches, myalgias  . Actos [Pioglitazone Hydrochloride] Other (See Comments)    Severe headache  . Amaryl Nausea Only and Other (See Comments)    Severe headache (patient is tolerating 2 mg dosing)  . Iron Diarrhea  . Sanctura [Trospium Chloride] Nausea Only and Other (See Comments)    Severe headache  . Statins Other (See Comments)    Severe headache,  Hips and leg weakness that cause patient not to be able to function as per patient.  Lisbeth Ply [Fesoterodine Fumarate] Other (See Comments)    Cramps, severe headache  . Norco [Hydrocodone-Acetaminophen]  Nausea And Vomiting and Anxiety  . Penicillins Swelling, Rash and Other (See Comments)    Has patient had a PCN reaction causing immediate rash, facial/tongue/throat swelling, SOB or lightheadedness with hypotension: No Has patient had a PCN reaction causing severe rash involving mucus membranes or skin necrosis: No Has patient had a PCN reaction that required hospitalization: Yes Has patient had a PCN reaction occurring within the last 10 years: No If all of the above answers are "NO", then may proceed with Cephalosporin use.   Marland Kitchen Ultram [Tramadol Hcl] Nausea And Vomiting      Home Medications:  Current Outpatient Medications:  .  acetaminophen (TYLENOL) 500 MG tablet, Take 1 tablet (500 mg total) by mouth every 6 (six) hours as needed for moderate pain or headache., Disp: 30 tablet, Rfl: 0 .  aspirin EC 81 MG tablet, Take 1 tablet (81 mg total) by mouth every evening., Disp:  , Rfl:  .  atenolol (TENORMIN) 50 MG tablet, Take 50 mg by mouth at bedtime. , Disp: , Rfl:  .  Cholecalciferol (VITAMIN D3) 50 MCG (2000 UT) TABS, Take 2,000 Units by mouth every evening., Disp: , Rfl:  .  cyanocobalamin (,VITAMIN B-12,) 1000 MCG/ML injection, Inject 1,000 mcg into the muscle every 30 (thirty) days., Disp: , Rfl:  .  dapagliflozin propanediol (FARXIGA) 10 MG TABS tablet, Take 10 mg by mouth daily. , Disp: , Rfl:  .  glimepiride (AMARYL) 2 MG tablet, Take 2 mg by mouth daily., Disp: , Rfl:  .  ibuprofen (ADVIL) 800 MG tablet, Take 1 tablet (800 mg total) by mouth every 8 (eight) hours as needed., Disp: 30 tablet, Rfl: 0 .  lisinopril (PRINIVIL,ZESTRIL) 40 MG tablet, Take 40 mg by mouth daily., Disp: , Rfl:  .  metFORMIN (GLUCOPHAGE) 500 MG tablet, Take 1,000 mg by mouth 2 (two) times daily with a meal. , Disp: , Rfl:  .  vitamin B-12 (CYANOCOBALAMIN) 1000 MCG tablet, Take 1,000 mcg by mouth daily., Disp: , Rfl:  .  ZINC OXIDE EX, Apply 1 application topically 3 (three) times daily as needed (psoriasis  flares). , Disp: , Rfl:  .  promethazine (PHENERGAN) 12.5 MG tablet, Take 1 tablet (12.5 mg total) by mouth every 6 (six) hours as needed for nausea or vomiting. (Patient not taking: Reported on 09/20/2019), Disp: 30 tablet, Rfl: 0   Family History: family history includes Cancer in her mother and sister; Heart attack in her father and mother.  Sister w/ cirrhosis (recently passed away)  Social History:   reports that she has quit smoking. Her smoking use included cigarettes. She quit smokeless tobacco use about 26 years ago. She reports current alcohol use of about 1.0 standard drink of alcohol per week. She reports that she does not use drugs.   Review of Systems: Constitutional: Denies weight loss/weight gain  Eyes: No changes in vision. ENT: No oral lesions, sore throat.  GI: see HPI.  Heme/Lymph: No easy bruising.  CV: No chest pain.  GU: No hematuria.  Integumentary: No rashes.  Neuro: No headaches.  Psych: No depression/anxiety.  Endocrine: No heat/cold intolerance.  Allergic/Immunologic: No urticaria.  Resp: No cough, SOB.  Musculoskeletal: No joint swelling.    Physical Examination: BP 112/62 (BP Location: Left Arm, Patient Position: Sitting, Cuff Size: Normal)   Pulse 76   Temp 97.9 F (36.6 C) (Oral)   Ht 5' 3"  (1.6 m)   Wt 135 lb 1.6 oz (61.3 kg)   BMI 23.93 kg/m  Gen: NAD, alert and oriented x 4 HEENT: PEERLA, EOMI, Neck: supple, no JVD Chest: CTA bilaterally, no wheezes, crackles, or other adventitious sounds CV: RRR, no m/g/c/r Abd: soft, NT, ND, +BS in all four quadrants; no HSM, guarding, ridigity, or rebound tenderness Ext: no edema, well perfused with 2+ pulses, Skin: no rash or lesions noted on observed skin Lymph: no noted LAD  Data Reviewed:  09/28/19--platelet 71, AFP 2.0, platelet 71, WBC 3.2, Hgb 8.6, albumin 3.2, creatinine 1.1.B12 normal.   06/2019--Colonoscopy Perianal skin tags found on perianal exam. - Two small polyps at the splenic flexure  and in the cecum. Biopsied. - One 6 mm polyp in the cecum, removed with a cold snare. Resected and retrieved. - Congested mucosa in the entire examined colon. - External hemorrhoids.  06/29/19-- EGD--Normal hypopharynx. - Normal proximal esophagus. - Grade I esophageal varices. Too small to be banded. - Z-line regular, 36 cm from the incisors. - Portal  hypertensive gastropathy. - Normal duodenal bulb and second portion of the duodenum. - No specimens collected.   [place] 10/08/19--IMPRESSION: Cirrhotic appearing liver with mild splenomegaly. Slight decrease in volume of a large mildly complicated cyst at the inferior pole the LEFT kidney. Post cholecystectomy. Atherosclerotic changes of the abdominal aorta with slight fusiform enlargement of the distal abdominal aorta versus mid aorta but only achieving a maximal diameter of 2.3 x 2.1 cm, not significantly changed; recommend follow-up imaging in 3 years to assess stability   Assessment/Plan: Jeanette Chang is a 79 y.o. female    Jeanette Chang was seen today for discusss test results.  Diagnoses and all orders for this visit:  Liver cirrhosis secondary to NASH (nonalcoholic steatohepatitis) ([place]) -     CBC with Differential    1.  Anemia-noted on labs at [facility] from 11 in May to Hgb 8.2 two weeks ago.  She denies any dark stools or blood in stools.  She is Hemoccult negative today in clinic with brown stool in her rectal vault.  We will repeat a CBC for comparison, she reports receiving 2 iron infusions.  She denies any heavy NSAID use.  She is not on any anticoag.  She had an endoscopy colonoscopy 3 months ago.  Would consider capsule endoscopy, will discuss with Dr. Laural Golden.  2.  Diarrhea-chronic at baseline, avoid clear food triggers.  3.  Cirrhosis-with associated thrombocytopenia, anemia.  Recent ultrasound this week did not show any ascites.  Her AFP is normal.  Clinically she has no encephalopathy.  To notify me if develops any  new symptoms, otherwise monitor every 6 months.   F/up 6 months. Consider capsule endoscopy for eval of anemia w/ unremarkable egd/colonsocopy 3 months ago.   I personally performed the service, non-incident to. (WP)  Laurine Blazer, [facility]

## 2019-10-11 NOTE — Patient Instructions (Signed)
We are repeating your hemoglobin today. Stool was negative for blood in the office

## 2019-10-12 LAB — CBC WITH DIFFERENTIAL/PLATELET
Absolute Monocytes: 256 cells/uL (ref 200–950)
Basophils Absolute: 29 cells/uL (ref 0–200)
Basophils Relative: 0.8 %
Eosinophils Absolute: 108 cells/uL (ref 15–500)
Eosinophils Relative: 3 %
HCT: 30 % — ABNORMAL LOW (ref 35.0–45.0)
Hemoglobin: 8.9 g/dL — ABNORMAL LOW (ref 11.7–15.5)
Lymphs Abs: 1454 cells/uL (ref 850–3900)
MCH: 25.1 pg — ABNORMAL LOW (ref 27.0–33.0)
MCHC: 29.7 g/dL — ABNORMAL LOW (ref 32.0–36.0)
MCV: 84.5 fL (ref 80.0–100.0)
MPV: 11.5 fL (ref 7.5–12.5)
Monocytes Relative: 7.1 %
Neutro Abs: 1753 cells/uL (ref 1500–7800)
Neutrophils Relative %: 48.7 %
Platelets: 90 10*3/uL — ABNORMAL LOW (ref 140–400)
RBC: 3.55 10*6/uL — ABNORMAL LOW (ref 3.80–5.10)
RDW: 19 % — ABNORMAL HIGH (ref 11.0–15.0)
Total Lymphocyte: 40.4 %
WBC: 3.6 10*3/uL — ABNORMAL LOW (ref 3.8–10.8)

## 2019-10-16 DIAGNOSIS — E1165 Type 2 diabetes mellitus with hyperglycemia: Secondary | ICD-10-CM | POA: Diagnosis not present

## 2019-10-16 DIAGNOSIS — M1712 Unilateral primary osteoarthritis, left knee: Secondary | ICD-10-CM | POA: Diagnosis not present

## 2019-10-16 DIAGNOSIS — D509 Iron deficiency anemia, unspecified: Secondary | ICD-10-CM | POA: Diagnosis not present

## 2019-10-16 DIAGNOSIS — I10 Essential (primary) hypertension: Secondary | ICD-10-CM | POA: Diagnosis not present

## 2019-10-17 ENCOUNTER — Encounter (INDEPENDENT_AMBULATORY_CARE_PROVIDER_SITE_OTHER): Payer: Self-pay | Admitting: *Deleted

## 2019-10-24 ENCOUNTER — Encounter (HOSPITAL_COMMUNITY)
Admission: RE | Disposition: A | Discharge status: Home / Self Care | Payer: Self-pay | Source: Home / Self Care | Attending: Internal Medicine

## 2019-10-24 ENCOUNTER — Ambulatory Visit (HOSPITAL_COMMUNITY)
Admission: RE | Admit: 2019-10-24 | Discharge: 2019-10-24 | Disposition: A | Discharge status: Home / Self Care | Payer: Medicare Other | Attending: Internal Medicine | Admitting: Internal Medicine

## 2019-10-24 DIAGNOSIS — Z885 Allergy status to narcotic agent status: Secondary | ICD-10-CM | POA: Diagnosis not present

## 2019-10-24 DIAGNOSIS — K7460 Unspecified cirrhosis of liver: Secondary | ICD-10-CM | POA: Diagnosis not present

## 2019-10-24 DIAGNOSIS — Z9049 Acquired absence of other specified parts of digestive tract: Secondary | ICD-10-CM | POA: Diagnosis not present

## 2019-10-24 DIAGNOSIS — K2980 Duodenitis without bleeding: Secondary | ICD-10-CM | POA: Insufficient documentation

## 2019-10-24 DIAGNOSIS — Z888 Allergy status to other drugs, medicaments and biological substances status: Secondary | ICD-10-CM | POA: Diagnosis not present

## 2019-10-24 DIAGNOSIS — K269 Duodenal ulcer, unspecified as acute or chronic, without hemorrhage or perforation: Secondary | ICD-10-CM | POA: Insufficient documentation

## 2019-10-24 DIAGNOSIS — K7581 Nonalcoholic steatohepatitis (NASH): Secondary | ICD-10-CM | POA: Insufficient documentation

## 2019-10-24 DIAGNOSIS — I10 Essential (primary) hypertension: Secondary | ICD-10-CM | POA: Insufficient documentation

## 2019-10-24 DIAGNOSIS — K3189 Other diseases of stomach and duodenum: Secondary | ICD-10-CM | POA: Diagnosis not present

## 2019-10-24 DIAGNOSIS — K766 Portal hypertension: Secondary | ICD-10-CM | POA: Diagnosis not present

## 2019-10-24 DIAGNOSIS — Z87891 Personal history of nicotine dependence: Secondary | ICD-10-CM | POA: Insufficient documentation

## 2019-10-24 DIAGNOSIS — D509 Iron deficiency anemia, unspecified: Secondary | ICD-10-CM | POA: Insufficient documentation

## 2019-10-24 DIAGNOSIS — E119 Type 2 diabetes mellitus without complications: Secondary | ICD-10-CM | POA: Insufficient documentation

## 2019-10-24 DIAGNOSIS — Z880 Allergy status to penicillin: Secondary | ICD-10-CM | POA: Diagnosis not present

## 2019-10-24 HISTORY — PX: GIVENS CAPSULE STUDY: SHX5432

## 2019-10-24 SURGERY — IMAGING PROCEDURE, GI TRACT, INTRALUMINAL, VIA CAPSULE

## 2019-10-24 NOTE — H&P (Signed)
Jeanette Chang is an 79 y.o. female.   Chief Complaint: Jeanette Chang is here for small bowel given capsule study. HPI: Jeanette Chang is 79 year old Caucasian female who was cirrhosis secondary to NASH who has developed iron deficiency anemia with more than 3 g drop in her H&H.  She had EGD and colonoscopy in May of this year.  No definite bleeding lesion was identified in upper or lower GI tract.  She could be losing blood from small bowel.  Therefore she is returning for small bowel given capsule study.  Past Medical History:  Diagnosis Date  . Abnormal LFTs   . Achilles bursitis or tendinitis   . Acute maxillary sinusitis   . Acute pharyngitis   . Anemia, unspecified   . Candidiasis of skin and nails   . Dermatophytosis of foot   . Dermatophytosis of the body   . Diverticulitis of colon (without mention of hemorrhage)(562.11)   . Edema   . Headache(784.0)   . Malaise and fatigue   . 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 Osteoarthrosis, unspecified whether generalized or localized, unspecified site   . Other dyspnea and respiratory abnormality   . Other psoriasis   . Other specified disorders of rotator cuff syndrome of shoulder and allied disorders   . Pain in joint   . Pure hypercholesterolemia   . RUQ pain   . Spasm of back muscles   . Thrombocytopenia, unspecified ([place])   . Type II or unspecified type diabetes mellitus without mention of complication, not stated as uncontrolled   . Unspecified essential hypertension   . Unspecified sinusitis (chronic)   . Urinary incontinence     Past Surgical History:  Procedure Laterality Date  . APPENDECTOMY  1964  . BIOPSY  03/02/2017   Procedure: BIOPSY;  Surgeon: Rogene Houston, MD;  [facility];  Service: Endoscopy;;  antral  . CAROTID ENDARTERECTOMY  2005   Left CEA  . CAROTID ENDARTERECTOMY  2009   Right CEA  . CHOLECYSTECTOMY  1982   Gall Bladder  . COLONOSCOPY  09  .  COLONOSCOPY N/A 06/27/2019   Procedure: COLONOSCOPY;  Surgeon: Rogene Houston, MD;  [facility];  Service: Endoscopy;  Laterality: N/A;  . ESOPHAGOGASTRODUODENOSCOPY N/A 03/02/2017   Procedure: ESOPHAGOGASTRODUODENOSCOPY (EGD);  Surgeon: Rogene Houston, MD;  [facility];  Service: Endoscopy;  Laterality: N/A;  12:55  . ESOPHAGOGASTRODUODENOSCOPY N/A 06/27/2019   Procedure: ESOPHAGOGASTRODUODENOSCOPY (EGD);  Surgeon: Rogene Houston, MD;  [facility];  Service: Endoscopy;  Laterality: N/A;  730  . KNEE ARTHROSCOPY WITH MEDIAL MENISECTOMY Left 07/10/2019   Procedure: KNEE ARTHROSCOPY WITH MEDIAL MENISCECTOMY;  Surgeon: Carole Civil, MD;  Location: [facility];  Service: Orthopedics;  Laterality: Left;  . NOSE SURGERY  1978  . PARATHYROIDECTOMY  1992  . POLYPECTOMY  03/02/2017   Procedure: POLYPECTOMY;  Surgeon: Rogene Houston, MD;  [facility];  Service: Endoscopy;;  gastric   . POLYPECTOMY  06/27/2019   Procedure: POLYPECTOMY;  Surgeon: Rogene Houston, MD;  [facility];  Service: Endoscopy;;  colon  . Power port  03/06/2009   Right upper chest   . TONSILLECTOMY    . TUMOR EXCISION Right August 24, 2013   [facility] Dr. Ellen Henri, ENT  . VAGINAL HYSTERECTOMY  1972    Family History  Problem Relation Age of Onset  . Heart attack Mother   .  Cancer Mother   . Heart attack Father   . Cancer Sister    Social History:  reports that she has quit smoking. Her smoking use included cigarettes. She quit smokeless tobacco use about 26 years ago. She reports current alcohol use of about 1.0 standard drink of alcohol per week. She reports that she does not use drugs.  Allergies:  Allergies  Allergen Reactions  . Codeine Nausea And Vomiting and Rash  . Monascus Purpureus Went Yeast Other (See Comments)    Headaches, myalgias  . Niacin And Related Other (See Comments)    Headaches, myalgias  . Red Yeast Rice  Other (See Comments)    Headaches, myalgias  . Actos [Pioglitazone Hydrochloride] Other (See Comments)    Severe headache  . Amaryl Nausea Only and Other (See Comments)    Severe headache (Jeanette Chang is tolerating 2 mg dosing)  . Iron Diarrhea  . Sanctura [Trospium Chloride] Nausea Only and Other (See Comments)    Severe headache  . Statins Other (See Comments)    Severe headache,  Hips and leg weakness that cause Jeanette Chang not to be able to function as per Jeanette Chang.  Lisbeth Ply [Fesoterodine Fumarate] Other (See Comments)    Cramps, severe headache  . Norco [Hydrocodone-Acetaminophen] Nausea And Vomiting and Anxiety  . Penicillins Swelling, Rash and Other (See Comments)    Has Jeanette Chang had a PCN reaction causing immediate rash, facial/tongue/throat swelling, SOB or lightheadedness with hypotension: No Has Jeanette Chang had a PCN reaction causing severe rash involving mucus membranes or skin necrosis: No Has Jeanette Chang had a PCN reaction that required hospitalization: Yes Has Jeanette Chang had a PCN reaction occurring within the last 10 years: No If all of the above answers are "NO", then may proceed with Cephalosporin use.   Marland Kitchen Ultram [Tramadol Hcl] Nausea And Vomiting    No medications prior to admission.    No results found for this or any previous visit (from the past 48 hour(s)). No results found.  Review of Systems  There were no vitals taken for this visit. Physical Exam   Assessment/Plan Iron deficiency anemia in Jeanette Chang with known cirrhosis. No lesion identified on EGD and colonoscopy of May 2021 to account for her iron deficiency anemia. Jeanette Chang is here for small bowel given capsule study.  Hildred Laser, MD 10/24/2019, 6:11 PM

## 2019-10-25 ENCOUNTER — Encounter (HOSPITAL_COMMUNITY): Payer: Self-pay | Admitting: Internal Medicine

## 2019-10-26 NOTE — Op Note (Signed)
Small Bowel [place] Capsule Study Procedure date: October 24, 2019  Referring Provider: Dr. Monico Blitz, MD PCP:  Dr. Monico Blitz, MD  Indication for procedure:   Patient is 79 year old Caucasian female with history of cirrhosis who who has developed iron deficiency anemia with more than 3 g drop in her hemoglobin responding to iron infusions.  She is suspected to be losing blood from her GI tract.  She had a esophagogastroduodenoscopy and colonoscopy in May 2021 and no bleeding lesion was identified.  EGD was performed to screen and band esophageal varices and colonoscopy was high risk because of family history.  She is therefore undergoing the study to evaluate small bowel mucosa.  Findings:  Patient was able to swallow given capsule without any difficulty. Study is complete as capsule mated to the colon. Mild changes of portal gastropathy noted in proximal stomach. Focal duodenitis with erosion just past the pylorus without stigmata of bleed.  Focal area with mucosal nodularity felt to be expanded on a large ampulla of Vater seen on image at 27 minutes and 27 seconds. Prominent lymphangiectasia seen on image and 57 minutes and 45 seconds. These 2 images can be seen under media.   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 Focal duodenitis with erosion at bulb without stigmata of bleeding. Abnormal experienced ampulla of Vater which needs to be further evaluated.  Please note no pictures of ampulla on recent EGD of Jun 29, 2019 No other small bowel abnormality or lesions noted to account for GI bleed.  Please note these findings were discussed with patient over the  phone. Will arrange for EGD next month with the duodenoscope.

## 2019-10-27 DIAGNOSIS — I891 Lymphangitis: Secondary | ICD-10-CM | POA: Diagnosis not present

## 2019-10-27 DIAGNOSIS — Z9889 Other specified postprocedural states: Secondary | ICD-10-CM | POA: Diagnosis not present

## 2019-10-27 DIAGNOSIS — K766 Portal hypertension: Secondary | ICD-10-CM | POA: Diagnosis not present

## 2019-10-27 DIAGNOSIS — K3189 Other diseases of stomach and duodenum: Secondary | ICD-10-CM | POA: Diagnosis not present

## 2019-10-27 DIAGNOSIS — D500 Iron deficiency anemia secondary to blood loss (chronic): Secondary | ICD-10-CM | POA: Diagnosis not present

## 2019-10-27 DIAGNOSIS — K2980 Duodenitis without bleeding: Secondary | ICD-10-CM | POA: Diagnosis not present

## 2019-11-01 ENCOUNTER — Ambulatory Visit: Payer: Medicare Other | Admitting: Orthopedic Surgery

## 2019-11-05 DIAGNOSIS — D72819 Decreased white blood cell count, unspecified: Secondary | ICD-10-CM | POA: Diagnosis not present

## 2019-11-05 DIAGNOSIS — E538 Deficiency of other specified B group vitamins: Secondary | ICD-10-CM | POA: Diagnosis not present

## 2019-11-05 DIAGNOSIS — D500 Iron deficiency anemia secondary to blood loss (chronic): Secondary | ICD-10-CM | POA: Diagnosis not present

## 2019-11-05 DIAGNOSIS — K7460 Unspecified cirrhosis of liver: Secondary | ICD-10-CM | POA: Diagnosis not present

## 2019-11-05 DIAGNOSIS — D696 Thrombocytopenia, unspecified: Secondary | ICD-10-CM | POA: Diagnosis not present

## 2019-11-08 ENCOUNTER — Ambulatory Visit (INDEPENDENT_AMBULATORY_CARE_PROVIDER_SITE_OTHER): Payer: Medicare Other | Admitting: Orthopedic Surgery

## 2019-11-08 ENCOUNTER — Encounter: Payer: Self-pay | Admitting: Orthopedic Surgery

## 2019-11-08 ENCOUNTER — Ambulatory Visit: Payer: Medicare Other

## 2019-11-08 ENCOUNTER — Other Ambulatory Visit: Payer: Self-pay

## 2019-11-08 VITALS — BP 151/69 | HR 69 | Ht 63.0 in | Wt 135.0 lb

## 2019-11-08 DIAGNOSIS — Z7189 Other specified counseling: Secondary | ICD-10-CM | POA: Diagnosis not present

## 2019-11-08 DIAGNOSIS — Z6824 Body mass index (BMI) 24.0-24.9, adult: Secondary | ICD-10-CM | POA: Diagnosis not present

## 2019-11-08 DIAGNOSIS — Z9889 Other specified postprocedural states: Secondary | ICD-10-CM

## 2019-11-08 DIAGNOSIS — Z79899 Other long term (current) drug therapy: Secondary | ICD-10-CM | POA: Diagnosis not present

## 2019-11-08 DIAGNOSIS — R5383 Other fatigue: Secondary | ICD-10-CM | POA: Diagnosis not present

## 2019-11-08 DIAGNOSIS — Z1331 Encounter for screening for depression: Secondary | ICD-10-CM | POA: Diagnosis not present

## 2019-11-08 DIAGNOSIS — T148XXA Other injury of unspecified body region, initial encounter: Secondary | ICD-10-CM

## 2019-11-08 DIAGNOSIS — Z299 Encounter for prophylactic measures, unspecified: Secondary | ICD-10-CM | POA: Diagnosis not present

## 2019-11-08 DIAGNOSIS — M1712 Unilateral primary osteoarthritis, left knee: Secondary | ICD-10-CM | POA: Diagnosis not present

## 2019-11-08 DIAGNOSIS — E7800 Pure hypercholesterolemia, unspecified: Secondary | ICD-10-CM | POA: Diagnosis not present

## 2019-11-08 DIAGNOSIS — M1710 Unilateral primary osteoarthritis, unspecified knee: Secondary | ICD-10-CM

## 2019-11-08 DIAGNOSIS — M79606 Pain in leg, unspecified: Secondary | ICD-10-CM | POA: Diagnosis not present

## 2019-11-08 DIAGNOSIS — Z0000 Encounter for general adult medical examination without abnormal findings: Secondary | ICD-10-CM | POA: Diagnosis not present

## 2019-11-08 DIAGNOSIS — Z1339 Encounter for screening examination for other mental health and behavioral disorders: Secondary | ICD-10-CM | POA: Diagnosis not present

## 2019-11-08 MED ORDER — ACETAMINOPHEN-CODEINE #3 300-30 MG PO TABS: 1.0000 | ORAL_TABLET | Freq: Four times a day (QID) | ORAL | 0 refills | Status: AC | PRN

## 2019-11-08 NOTE — Patient Instructions (Addendum)
Continue brace  Continue cane  Take the pain medication    4 weeks follow-up

## 2019-11-08 NOTE — Progress Notes (Signed)
Chief Complaint  Patient presents with  . Knee Pain    left knee still having pain and burning, only relief is when she sits with leg higher than her body.    Encounter Diagnoses  Name Primary?  . S/P left knee arthroscopy 07/10/19 Yes  . Bone bruise   . 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 acetaminophen-codeine (TYLENOL #3) 300-30 MG tablet    Sig: Take 1 tablet by mouth every 6 (six) hours as needed for up to 7 days for moderate pain.    Dispense:  28 tablet    Refill:  0     Procedure note left knee injection   verbal consent was obtained to inject left knee The medial soft tissue sleeve in the area of primary tenderness was injected Timeout was completed to confirm the site of injection  The medications used were 40 mg of Depo-Medrol and 1% lidocaine 3 cc  Anesthesia was provided by ethyl chloride and the skin was prepped with alcohol.  After cleaning the skin with alcohol a 20-gauge needle was used to inject the left knee There were no complications. A sterile bandage was applied.    79 year old female had knee arthroscopy for torn medial meniscus bone contusion and osteoarthritis she has not gotten any better since the surgery complains of burning pain medial joint line inability to sleep at night painful weightbearing despite protected weightbearing  Still having tenderness over the medial joint line proximal tibia where the bone contusion was range of motion is 5-1 25 knee is stable no effusion  Repeat x-ray again shows no collapse of the bone, narrowing of the  medial compartment no fracture

## 2019-11-12 DIAGNOSIS — K7460 Unspecified cirrhosis of liver: Secondary | ICD-10-CM | POA: Diagnosis not present

## 2019-11-12 DIAGNOSIS — D500 Iron deficiency anemia secondary to blood loss (chronic): Secondary | ICD-10-CM | POA: Diagnosis not present

## 2019-11-12 DIAGNOSIS — D72819 Decreased white blood cell count, unspecified: Secondary | ICD-10-CM | POA: Diagnosis not present

## 2019-11-12 DIAGNOSIS — R233 Spontaneous ecchymoses: Secondary | ICD-10-CM | POA: Diagnosis not present

## 2019-11-12 DIAGNOSIS — K769 Liver disease, unspecified: Secondary | ICD-10-CM | POA: Diagnosis not present

## 2019-11-12 DIAGNOSIS — Z6825 Body mass index (BMI) 25.0-25.9, adult: Secondary | ICD-10-CM | POA: Diagnosis not present

## 2019-11-12 DIAGNOSIS — D732 Chronic congestive splenomegaly: Secondary | ICD-10-CM | POA: Diagnosis not present

## 2019-11-13 DIAGNOSIS — D509 Iron deficiency anemia, unspecified: Secondary | ICD-10-CM | POA: Diagnosis not present

## 2019-11-20 DIAGNOSIS — D509 Iron deficiency anemia, unspecified: Secondary | ICD-10-CM | POA: Diagnosis not present

## 2019-11-20 DIAGNOSIS — R233 Spontaneous ecchymoses: Secondary | ICD-10-CM | POA: Insufficient documentation

## 2019-11-29 DIAGNOSIS — Z23 Encounter for immunization: Secondary | ICD-10-CM | POA: Diagnosis not present

## 2019-12-05 ENCOUNTER — Other Ambulatory Visit: Payer: Self-pay

## 2019-12-05 ENCOUNTER — Ambulatory Visit (INDEPENDENT_AMBULATORY_CARE_PROVIDER_SITE_OTHER): Payer: Medicare Other | Admitting: Orthopedic Surgery

## 2019-12-05 ENCOUNTER — Encounter: Payer: Self-pay | Admitting: Orthopedic Surgery

## 2019-12-05 VITALS — BP 134/66 | HR 68 | Ht 63.0 in | Wt 135.0 lb

## 2019-12-05 DIAGNOSIS — T148XXA Other injury of unspecified body region, initial encounter: Secondary | ICD-10-CM

## 2019-12-05 DIAGNOSIS — G8929 Other chronic pain: Secondary | ICD-10-CM

## 2019-12-05 DIAGNOSIS — Z9889 Other specified postprocedural states: Secondary | ICD-10-CM

## 2019-12-05 DIAGNOSIS — M25562 Pain in left knee: Secondary | ICD-10-CM

## 2019-12-05 DIAGNOSIS — M1710 Unilateral primary osteoarthritis, unspecified knee: Secondary | ICD-10-CM

## 2019-12-05 MED ORDER — HYDROCODONE-ACETAMINOPHEN 5-325 MG PO TABS
1.0000 | ORAL_TABLET | Freq: Four times a day (QID) | ORAL | 0 refills | Status: DC | PRN
Start: 2019-12-05 — End: 2020-01-02

## 2019-12-05 MED ORDER — PROMETHAZINE HCL 12.5 MG PO TABS: 12.5000 mg | ORAL_TABLET | Freq: Four times a day (QID) | ORAL | 5 refills | Status: DC | PRN

## 2019-12-05 NOTE — Patient Instructions (Addendum)
Stop tylenol with codeine   Start norco with phenergan   Continue brace

## 2019-12-05 NOTE — Progress Notes (Signed)
E3 [id number]

## 2019-12-05 NOTE — Progress Notes (Addendum)
Chief Complaint  Patient presents with  . Knee Pain    left/ has burning pain with walking can only get comfortable in recliner     Jeanette Chang is still having trouble with her left knee primarily medial side with continued burning pain she did not tolerate the Tylenol with codeine because of nausea  In review she is an 79 year old female she had a meniscectomy she had a bone contusion on the medial side she had osteoarthritis as well she did get good relief from the injection but it was short-lived and by the time she got home the pain was back  I looked at her allergies she is allergic to all medications related to codeine but the allergy is really nausea  Can I switch her over to Norco and Phenergan keep her in a brace and come back in 4 weeks and recheck.  Meds ordered this encounter  Medications  . promethazine (PHENERGAN) 12.5 MG tablet    Sig: Take 1 tablet (12.5 mg total) by mouth every 6 (six) hours as needed for nausea or vomiting.    Dispense:  30 tablet    Refill:  5  . HYDROcodone-acetaminophen (NORCO/VICODIN) 5-325 MG tablet    Sig: Take 1 tablet by mouth every 6 (six) hours as needed for moderate pain.    Dispense:  30 tablet    Refill:  0

## 2019-12-06 DIAGNOSIS — E538 Deficiency of other specified B group vitamins: Secondary | ICD-10-CM | POA: Diagnosis not present

## 2019-12-06 DIAGNOSIS — D708 Other neutropenia: Secondary | ICD-10-CM | POA: Diagnosis not present

## 2019-12-06 DIAGNOSIS — D509 Iron deficiency anemia, unspecified: Secondary | ICD-10-CM | POA: Diagnosis not present

## 2019-12-06 DIAGNOSIS — D500 Iron deficiency anemia secondary to blood loss (chronic): Secondary | ICD-10-CM | POA: Diagnosis not present

## 2019-12-06 DIAGNOSIS — D696 Thrombocytopenia, unspecified: Secondary | ICD-10-CM | POA: Diagnosis not present

## 2019-12-12 DIAGNOSIS — I872 Venous insufficiency (chronic) (peripheral): Secondary | ICD-10-CM | POA: Diagnosis not present

## 2019-12-12 DIAGNOSIS — K7460 Unspecified cirrhosis of liver: Secondary | ICD-10-CM | POA: Diagnosis not present

## 2019-12-12 DIAGNOSIS — D500 Iron deficiency anemia secondary to blood loss (chronic): Secondary | ICD-10-CM | POA: Diagnosis not present

## 2019-12-12 DIAGNOSIS — R161 Splenomegaly, not elsewhere classified: Secondary | ICD-10-CM | POA: Diagnosis not present

## 2019-12-14 DIAGNOSIS — I10 Essential (primary) hypertension: Secondary | ICD-10-CM | POA: Diagnosis not present

## 2019-12-14 DIAGNOSIS — D509 Iron deficiency anemia, unspecified: Secondary | ICD-10-CM | POA: Diagnosis not present

## 2019-12-14 DIAGNOSIS — E1165 Type 2 diabetes mellitus with hyperglycemia: Secondary | ICD-10-CM | POA: Diagnosis not present

## 2019-12-14 DIAGNOSIS — M1712 Unilateral primary osteoarthritis, left knee: Secondary | ICD-10-CM | POA: Diagnosis not present

## 2019-12-31 DIAGNOSIS — E1165 Type 2 diabetes mellitus with hyperglycemia: Secondary | ICD-10-CM | POA: Diagnosis not present

## 2019-12-31 DIAGNOSIS — E1142 Type 2 diabetes mellitus with diabetic polyneuropathy: Secondary | ICD-10-CM | POA: Diagnosis not present

## 2019-12-31 DIAGNOSIS — G8194 Hemiplegia, unspecified affecting left nondominant side: Secondary | ICD-10-CM | POA: Diagnosis not present

## 2019-12-31 DIAGNOSIS — E7800 Pure hypercholesterolemia, unspecified: Secondary | ICD-10-CM | POA: Diagnosis not present

## 2019-12-31 DIAGNOSIS — Z299 Encounter for prophylactic measures, unspecified: Secondary | ICD-10-CM | POA: Diagnosis not present

## 2019-12-31 DIAGNOSIS — I10 Essential (primary) hypertension: Secondary | ICD-10-CM | POA: Diagnosis not present

## 2019-12-31 DIAGNOSIS — Z6824 Body mass index (BMI) 24.0-24.9, adult: Secondary | ICD-10-CM | POA: Diagnosis not present

## 2020-01-02 ENCOUNTER — Encounter: Payer: Self-pay | Admitting: Orthopedic Surgery

## 2020-01-02 ENCOUNTER — Ambulatory Visit (INDEPENDENT_AMBULATORY_CARE_PROVIDER_SITE_OTHER): Payer: Medicare Other | Admitting: Orthopedic Surgery

## 2020-01-02 ENCOUNTER — Other Ambulatory Visit: Payer: Self-pay

## 2020-01-02 VITALS — BP 169/70 | HR 72 | Ht 63.0 in | Wt 131.0 lb

## 2020-01-02 DIAGNOSIS — M8430XA Stress fracture, unspecified site, initial encounter for fracture: Secondary | ICD-10-CM

## 2020-01-02 DIAGNOSIS — M25562 Pain in left knee: Secondary | ICD-10-CM

## 2020-01-02 DIAGNOSIS — G8929 Other chronic pain: Secondary | ICD-10-CM

## 2020-01-02 MED ORDER — HYDROCODONE-ACETAMINOPHEN 5-325 MG PO TABS: 1.0000 | ORAL_TABLET | Freq: Two times a day (BID) | ORAL | 0 refills | Status: AC

## 2020-01-02 NOTE — Progress Notes (Signed)
Chief Complaint  Patient presents with  . Knee Pain    Left, follow up    S/p left knee arthroscopy with underlying stress rxn medial tibia   Breniya still complains of pain over the medial tibia where she had an underlying stress fracture.  She is using a brace but not a cane she says the brace does not help she prefers to keep the knee straight   C/o pain and burning left knee on the medial side   The pain meds still help   Encounter Diagnosis  Name Primary?  . Stress reaction of bone Yes    rec repeat mri to check for stress frx   Left knee exam no effusion rom is 0-115 Medial side is tender over the proximal tibia where there was a stress reaction in the bone   Meds ordered this encounter  Medications  . HYDROcodone-acetaminophen (NORCO/VICODIN) 5-325 MG tablet    Sig: Take 1 tablet by mouth in the morning and at bedtime for 7 days.    Dispense:  14 tablet    Refill:  0

## 2020-01-02 NOTE — Patient Instructions (Addendum)
Schedule MRI / call to schedule the MRI at [facility] the number is [phone]   Use walker or cane

## 2020-01-15 ENCOUNTER — Ambulatory Visit (HOSPITAL_COMMUNITY)
Admission: RE | Admit: 2020-01-15 | Discharge: 2020-01-15 | Disposition: A | Discharge status: Home / Self Care | Payer: Medicare Other | Source: Ambulatory Visit | Attending: Orthopedic Surgery | Admitting: Orthopedic Surgery

## 2020-01-15 ENCOUNTER — Other Ambulatory Visit: Payer: Self-pay

## 2020-01-15 DIAGNOSIS — M8430XA Stress fracture, unspecified site, initial encounter for fracture: Secondary | ICD-10-CM | POA: Diagnosis not present

## 2020-01-15 DIAGNOSIS — G8929 Other chronic pain: Secondary | ICD-10-CM | POA: Diagnosis not present

## 2020-01-15 DIAGNOSIS — M25562 Pain in left knee: Secondary | ICD-10-CM | POA: Diagnosis not present

## 2020-01-15 IMAGING — MR MR KNEE*L* W/O CM
7 series · 40 of 40 positions shown · non-contrast
Comparison: Left knee x-rays dated [DATE].

CLINICAL DATA: Chronic left knee pain for the past year. No injury.

EXAM:
MRI OF THE LEFT KNEE WITHOUT CONTRAST
TECHNIQUE: Multiplanar, multisequence MR imaging of the knee was performed. No
intravenous contrast was administered.

[Series 8: T2 fat-sat · axial · left · 4.0mm · 0.47mm/px · z∈[-63,+60]mm · 5 of 26 slices shown (1 of 3)]
[im 1/26]
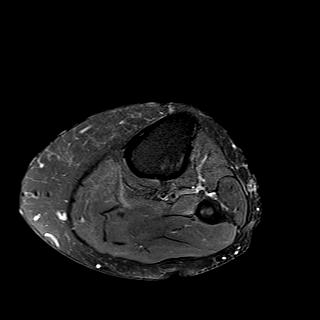
[im 7/26]
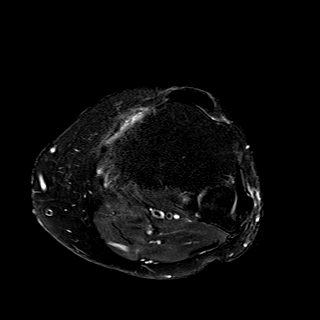
[im 13/26]
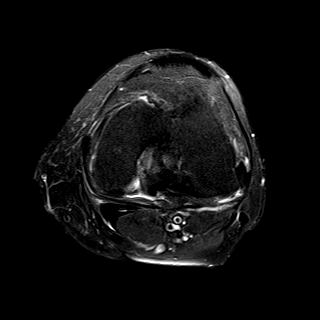
[im 19/26]
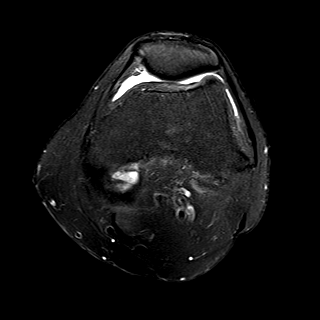
[im 26/26]
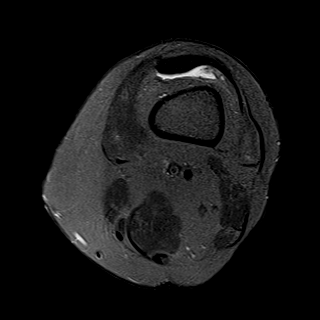

[Series 9: T1 · coronal · left · 4.0mm · 0.59mm/px · 6 of 24 slices shown]
[im 1/24]
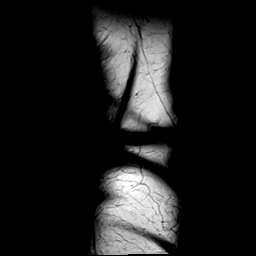
[im 5/24]
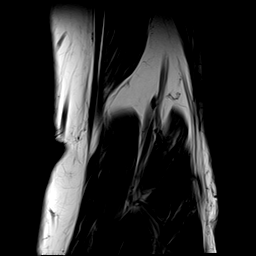
[im 10/24]
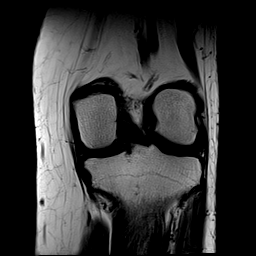
[im 14/24]
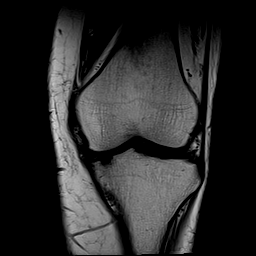
[im 19/24]
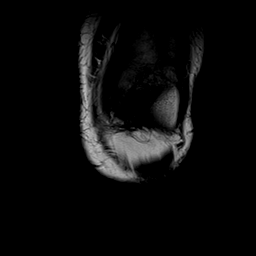
[im 24/24]
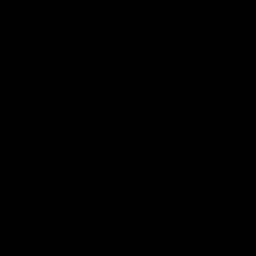

[Series 10: T2 fat-sat · coronal · left · 4.0mm · 0.59mm/px · 7 of 28 slices shown (2 of 3)]
[im 1/28]
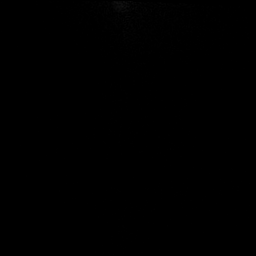
[im 5/28]
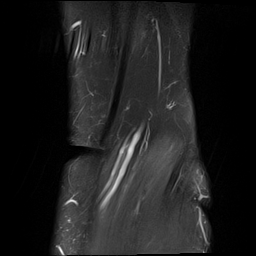
[im 10/28]
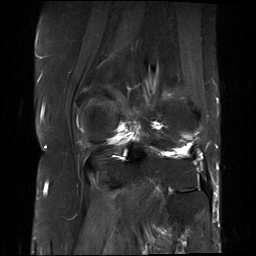
[im 14/28]
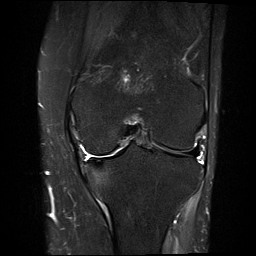
[im 19/28]
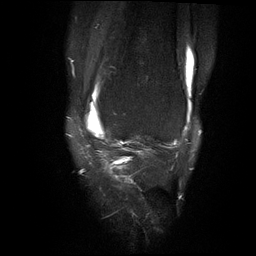
[im 23/28]
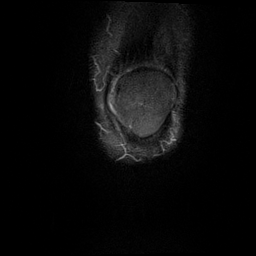
[im 28/28]
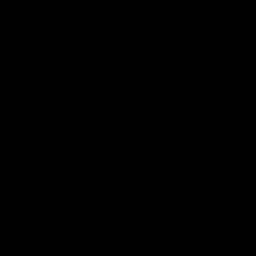

[Series 11: PD fat-sat · coronal · left · 4.0mm · 0.59mm/px · 7 of 28 slices shown (1 of 2)]
[im 1/28]
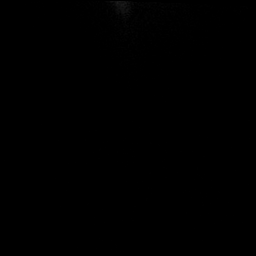
[im 5/28]
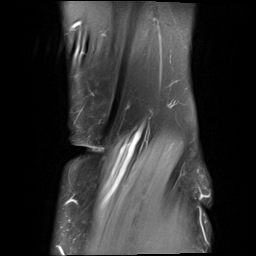
[im 10/28]
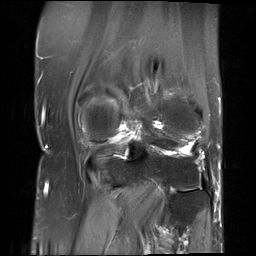
[im 14/28]
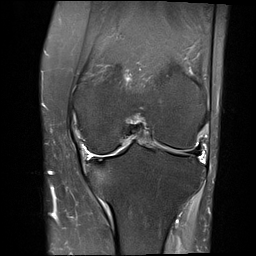
[im 19/28]
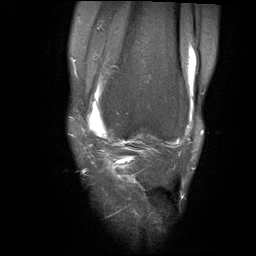
[im 23/28]
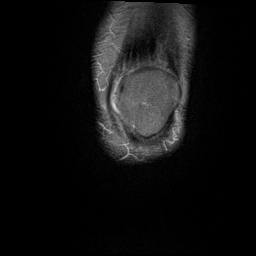
[im 28/28]
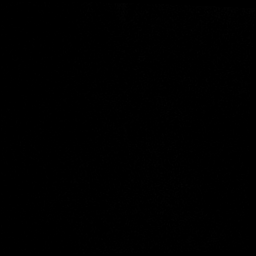

[Series 12: PD fat-sat · sagittal · left · 3.0mm · 0.59mm/px · 6 of 27 slices shown (2 of 2)]
[im 1/27]
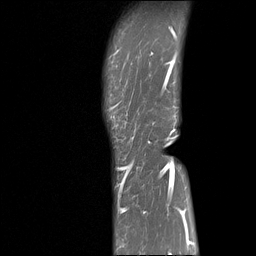
[im 6/27]
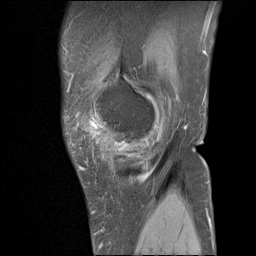
[im 11/27]
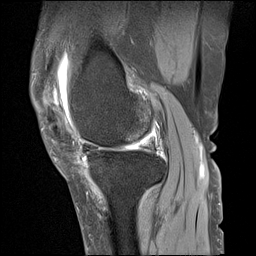
[im 16/27]
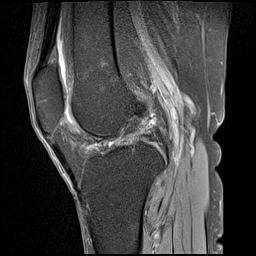
[im 21/27]
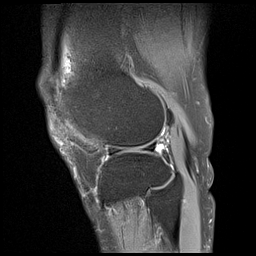
[im 27/27]
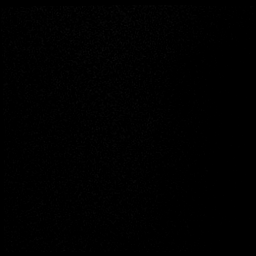

[Series 13: T2 fat-sat · sagittal · left · 3.0mm · 0.59mm/px · 7 of 28 slices shown (3 of 3)]
[im 1/28]
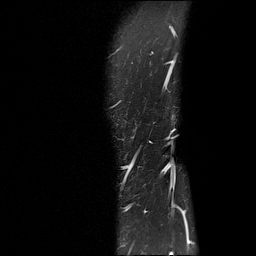
[im 5/28]
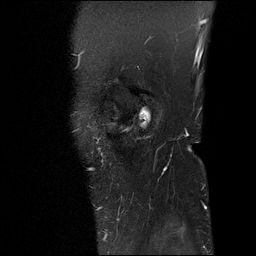
[im 10/28]
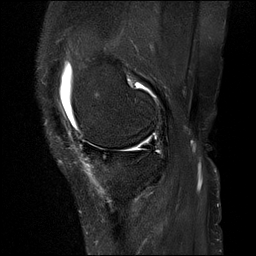
[im 14/28]
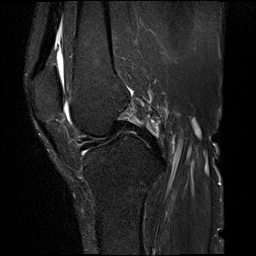
[im 19/28]
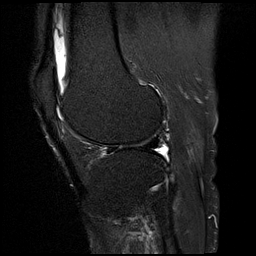
[im 23/28]
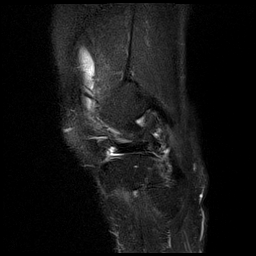
[im 28/28]
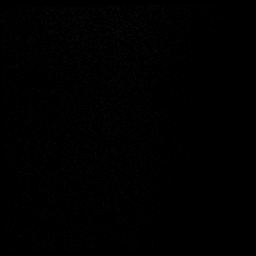

[Series 14: PD · coronal · left · 2.0mm · 0.47mm/px · 2 of 10 slices shown]
[im 1/10]
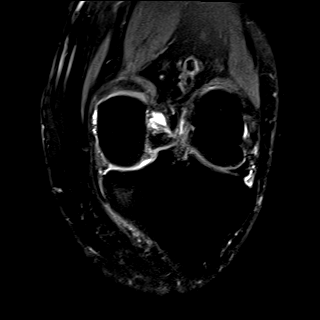
[im 10/10]
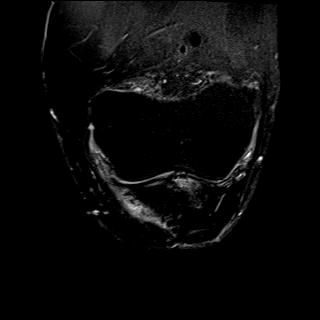

[40 of 40 positions shown; findings below may reference images not displayed]

FINDINGS: MENISCI

Medial meniscus: Diminutive body with large radial tear. Horizontal
tear of the posterior horn (series 13, image 9).

Lateral meniscus:  Intact.

LIGAMENTS

Cruciates:  Intact ACL and PCL.

Collaterals: Medial collateral ligament is intact. Lateral
collateral ligament complex is intact.

CARTILAGE

Patellofemoral:  Diffuse cartilage thinning without focal defect.

Medial: Near full-thickness cartilage loss over the central
weight-bearing medial femoral condyle and medial tibial plateau.
Small subchondral cystic change and faint marrow edema in the medial
tibial plateau.

Lateral:  No chondral defect.

Joint:  Small joint effusion.  Normal Hoffa's fat.

Popliteal Fossa:  No Baker cyst. Intact popliteus tendon.

Extensor Mechanism: Intact quadriceps tendon and patellar tendon.
Intact medial and lateral patellar retinaculum. Intact MPFL.

Bones: No acute fracture or dislocation. No suspicious bone lesion.

Other: None.
IMPRESSION: 1. Diminutive medial meniscus body with large radial tear.
Additional horizontal tear of the posterior horn.
2. Moderate medial and mild patellofemoral compartment
osteoarthritis.
3. Small joint effusion.

## 2020-01-28 ENCOUNTER — Ambulatory Visit (INDEPENDENT_AMBULATORY_CARE_PROVIDER_SITE_OTHER): Payer: Medicare Other | Admitting: Orthopedic Surgery

## 2020-01-28 ENCOUNTER — Telehealth: Payer: Self-pay | Admitting: Orthopedic Surgery

## 2020-01-28 ENCOUNTER — Other Ambulatory Visit: Payer: Self-pay

## 2020-01-28 ENCOUNTER — Encounter: Payer: Self-pay | Admitting: Orthopedic Surgery

## 2020-01-28 VITALS — BP 159/71 | HR 70 | Ht 63.0 in | Wt 131.2 lb

## 2020-01-28 DIAGNOSIS — T148XXA Other injury of unspecified body region, initial encounter: Secondary | ICD-10-CM

## 2020-01-28 DIAGNOSIS — M1712 Unilateral primary osteoarthritis, left knee: Secondary | ICD-10-CM | POA: Diagnosis not present

## 2020-01-28 DIAGNOSIS — M1710 Unilateral primary osteoarthritis, unspecified knee: Secondary | ICD-10-CM

## 2020-01-28 DIAGNOSIS — G8929 Other chronic pain: Secondary | ICD-10-CM | POA: Diagnosis not present

## 2020-01-28 DIAGNOSIS — M23322 Other meniscus derangements, posterior horn of medial meniscus, left knee: Secondary | ICD-10-CM

## 2020-01-28 DIAGNOSIS — Z9889 Other specified postprocedural states: Secondary | ICD-10-CM

## 2020-01-28 DIAGNOSIS — M8430XA Stress fracture, unspecified site, initial encounter for fracture: Secondary | ICD-10-CM | POA: Diagnosis not present

## 2020-01-28 NOTE — Patient Instructions (Signed)
Having surgery in Jan   Post op appt 5-7 days after surgery

## 2020-01-28 NOTE — Telephone Encounter (Signed)
Patient asking if she may have prescription ordered for pain - states uses it at night time. If approved, patient uses [facility] in [place].

## 2020-01-28 NOTE — Progress Notes (Signed)
Chief Complaint  Patient presents with  . Knee Pain    S/p left knee 07/10/2019. No swelling, still hurting,     Encounter Diagnoses  Name Primary?  . S/P left knee arthroscopy 07/10/19 Yes  . Bone bruise   . Chronic pain of left knee   . Stress reaction of bone   . Primary localized osteoarthritis of knee left knee   . 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      79 year old female had knee arthroscopy in May 2022 postoperatively we treated her for her bone lesion which was a stress fracture in the medial tibia however she continued complain of pain repeat x-rays did not show any necrosis she finally went for MRI and she now has a recurrent tear of the medial meniscus with healing of the bone stress reaction  She is interested in having arthroscopic surgery     MRI shows an obvious medial meniscal tear grade 4 lesion of the medial femoral condyle and near resolution of the medial tibial stress reaction  Report is included MRI: Patellofemoral:  Diffuse cartilage thinning without focal defect.   Medial: Near full-thickness cartilage loss over the central weight-bearing medial femoral condyle and medial tibial plateau. Small subchondral cystic change and faint marrow edema in the medial tibial plateau.   Lateral:  No chondral defect.   Joint:  Small joint effusion.  Normal Hoffa's fat.   Popliteal Fossa:  No Baker cyst. Intact popliteus tendon.   Extensor Mechanism: Intact quadriceps tendon and patellar tendon. Intact medial and lateral patellar retinaculum. Intact MPFL.   Bones: No acute fracture or dislocation. No suspicious bone lesion.   Other: None.   IMPRESSION: 1. Diminutive medial meniscus body with large radial tear. Additional horizontal tear of the posterior horn. 2. Moderate medial and mild patellofemoral compartment osteoarthritis. 3. Small joint effusion.     Electronically Signed   By: Titus Dubin M.D.   On: 01/15/2020  16:36  The procedure has been fully reviewed with the patient; The risks and benefits of surgery have been discussed and explained and understood. Alternative treatment has also been reviewed, questions were encouraged and answered. The postoperative plan is also been reviewed.   Arthroscopic surgery left knee medial meniscectomy

## 2020-02-04 ENCOUNTER — Telehealth: Payer: Self-pay

## 2020-02-06 DIAGNOSIS — R5382 Chronic fatigue, unspecified: Secondary | ICD-10-CM | POA: Diagnosis not present

## 2020-02-06 DIAGNOSIS — K7460 Unspecified cirrhosis of liver: Secondary | ICD-10-CM | POA: Diagnosis not present

## 2020-02-06 DIAGNOSIS — D508 Other iron deficiency anemias: Secondary | ICD-10-CM | POA: Diagnosis not present

## 2020-02-06 DIAGNOSIS — E538 Deficiency of other specified B group vitamins: Secondary | ICD-10-CM | POA: Diagnosis not present

## 2020-02-06 DIAGNOSIS — D708 Other neutropenia: Secondary | ICD-10-CM | POA: Diagnosis not present

## 2020-02-06 DIAGNOSIS — D500 Iron deficiency anemia secondary to blood loss (chronic): Secondary | ICD-10-CM | POA: Diagnosis not present

## 2020-02-06 DIAGNOSIS — D509 Iron deficiency anemia, unspecified: Secondary | ICD-10-CM | POA: Diagnosis not present

## 2020-02-12 DIAGNOSIS — D500 Iron deficiency anemia secondary to blood loss (chronic): Secondary | ICD-10-CM | POA: Diagnosis not present

## 2020-02-12 DIAGNOSIS — Z6824 Body mass index (BMI) 24.0-24.9, adult: Secondary | ICD-10-CM | POA: Diagnosis not present

## 2020-02-12 DIAGNOSIS — K7460 Unspecified cirrhosis of liver: Secondary | ICD-10-CM | POA: Diagnosis not present

## 2020-02-13 ENCOUNTER — Telehealth: Payer: Self-pay | Admitting: Orthopedic Surgery

## 2020-02-13 NOTE — Telephone Encounter (Signed)
Patient called about surgery on 1/7 she has preop on 1/6 platelets are 46 she needs unit of platelets before surgery   We will hold off on the surgery for now. And she will let me know when she gets better platelet count. (discussed with Dr Aline Brochure and patient )

## 2020-02-13 NOTE — Telephone Encounter (Signed)
Please call Jeanette Chang as soon as you have a moment.  She has questions regarding doing pre op for her surgery on the 7th.  Thanks

## 2020-02-14 ENCOUNTER — Telehealth: Payer: Self-pay | Admitting: Orthopedic Surgery

## 2020-02-14 NOTE — Telephone Encounter (Signed)
I spoke to a lady who stated Dr. Juanita Craver would like to speak to Dr Aline Brochure.  His phone number is [phone].  I spoke to Derek Mound of this office and she said Dr. Aline Brochure already has this message.

## 2020-02-15 DIAGNOSIS — E1165 Type 2 diabetes mellitus with hyperglycemia: Secondary | ICD-10-CM | POA: Diagnosis not present

## 2020-02-15 DIAGNOSIS — I10 Essential (primary) hypertension: Secondary | ICD-10-CM | POA: Diagnosis not present

## 2020-02-15 DIAGNOSIS — M1712 Unilateral primary osteoarthritis, left knee: Secondary | ICD-10-CM | POA: Diagnosis not present

## 2020-02-20 ENCOUNTER — Telehealth: Payer: Self-pay

## 2020-02-20 NOTE — Telephone Encounter (Signed)
2nd time sending.  I spoke to a lady who stated Dr. Juanita Craver would like to speak to Dr Aline Brochure.   His phone number is [phone]. Please give him a call

## 2020-02-20 NOTE — Telephone Encounter (Signed)
Called pt to let her know that the surgery is still cancelled as of now. She asked if Dr. Aline Brochure has had a chance to speak with her Hematologist Dr. Juanita Craver [phone]) because she's called both offices and hasn't heard anything. Pt states her pain is still pretty severe and she would like to know what she needs to do next to get relief. Please advise.

## 2020-02-20 NOTE — Telephone Encounter (Signed)
Pt said the hospital called her yesterday and advised her she is having a procedure on 02/22/20. She said she thought that all of this was canceled. She said she is clueless to what is going on and would like the nurse to return her call

## 2020-02-20 NOTE — Telephone Encounter (Signed)
I called Hoyle Sauer at scheduling and had her cancel this surgery.

## 2020-02-21 ENCOUNTER — Encounter (HOSPITAL_COMMUNITY): Payer: Medicare Other

## 2020-02-21 ENCOUNTER — Other Ambulatory Visit (HOSPITAL_COMMUNITY): Payer: Medicare Other

## 2020-02-22 ENCOUNTER — Ambulatory Visit (HOSPITAL_COMMUNITY): Admission: RE | Admit: 2020-02-22 | Payer: Medicare Other | Source: Home / Self Care | Admitting: Orthopedic Surgery

## 2020-02-22 ENCOUNTER — Encounter (HOSPITAL_COMMUNITY): Admission: RE | Payer: Self-pay | Source: Home / Self Care

## 2020-02-22 SURGERY — ARTHROSCOPY, KNEE, WITH MEDIAL MENISCECTOMY
Anesthesia: Choice | Site: Knee | Laterality: Left

## 2020-02-26 ENCOUNTER — Telehealth: Payer: Self-pay | Admitting: Orthopedic Surgery

## 2020-02-26 DIAGNOSIS — Z6824 Body mass index (BMI) 24.0-24.9, adult: Secondary | ICD-10-CM | POA: Diagnosis not present

## 2020-02-26 DIAGNOSIS — K7460 Unspecified cirrhosis of liver: Secondary | ICD-10-CM | POA: Diagnosis not present

## 2020-02-26 DIAGNOSIS — Z7189 Other specified counseling: Secondary | ICD-10-CM | POA: Diagnosis not present

## 2020-02-26 DIAGNOSIS — D508 Other iron deficiency anemias: Secondary | ICD-10-CM | POA: Diagnosis not present

## 2020-02-26 DIAGNOSIS — D708 Other neutropenia: Secondary | ICD-10-CM | POA: Diagnosis not present

## 2020-02-26 DIAGNOSIS — D732 Chronic congestive splenomegaly: Secondary | ICD-10-CM | POA: Diagnosis not present

## 2020-02-26 DIAGNOSIS — S83232D Complex tear of medial meniscus, current injury, left knee, subsequent encounter: Secondary | ICD-10-CM | POA: Diagnosis not present

## 2020-02-26 DIAGNOSIS — D500 Iron deficiency anemia secondary to blood loss (chronic): Secondary | ICD-10-CM | POA: Diagnosis not present

## 2020-02-26 DIAGNOSIS — E538 Deficiency of other specified B group vitamins: Secondary | ICD-10-CM | POA: Diagnosis not present

## 2020-02-26 DIAGNOSIS — D696 Thrombocytopenia, unspecified: Secondary | ICD-10-CM | POA: Diagnosis not present

## 2020-02-26 NOTE — Telephone Encounter (Signed)
Done

## 2020-02-26 NOTE — Telephone Encounter (Signed)
Call received from [facility], clinic LPN, [facility], direct ph# [phone] / fax# [phone], relaying that oncologist Dr Federico Flake has been speaking with Dr Aline Brochure. Jeanette Chang is requesting note or form that is required for clearance regarding surgery (patient's 02/22/20 procedure was cancelled).  Please advise.

## 2020-02-27 ENCOUNTER — Telehealth: Payer: Self-pay | Admitting: Radiology

## 2020-02-27 ENCOUNTER — Other Ambulatory Visit: Payer: Self-pay | Admitting: Orthopedic Surgery

## 2020-02-27 NOTE — Telephone Encounter (Signed)
Letter sent to Dr Toni Arthurs for clearance Patient states her numbers are good I have put her down for Jan 28th for the SALK medial meniscectomy

## 2020-02-28 ENCOUNTER — Ambulatory Visit: Payer: Medicare Other | Admitting: Orthopedic Surgery

## 2020-03-10 NOTE — Patient Instructions (Signed)
Jeanette Chang  03/10/2020     @PREFPERIOPPHARMACY @   Your procedure is scheduled on  03/14/2020.    Report to [facility] at  [phone]  A.M.    Call this number if you have problems the morning of surgery:  [phone]   Remember:  Do not eat or drink after midnight.                        Take these medicines the morning of surgery with A SIP OF WATER  Phenergan (if needed).    Please brush your teeth.  Do not wear jewelry, make-up or nail polish.  Do not wear lotions, powders, or perfumes, or deodorant.  Do not shave 48 hours prior to surgery.  Men may shave face and neck.  Do not bring valuables to the hospital.  [facility] is not responsible for any belongings or valuables.  Contacts, dentures or bridgework may not be worn into surgery.  Leave your suitcase in the car.  After surgery it may be brought to your room.  For patients admitted to the hospital, discharge time will be determined by your treatment team.  Patients discharged the day of surgery will not be allowed to drive home and they will need someone with them for 24 hours.    Special instructions:  DO NOT smoke (tobacco or vape) the morning of your procedure.   Shower the night before and the morning of your procedure with CHG. DO NOT put CHG on your face, hair or genitals (private parts). Use your regular soap on these areas.  After your night time shower, dry off with a clean towel and out on clean clothes to sleep in. Put clean sheets on your bed to sleep on this night.  After your morning shower, dry off with a clean towel and put on clean, comfortable clothes to wear to the hospital and brush your teeth before you come.  Please read over the following fact sheets that you were given. Coughing and Deep Breathing, Surgical Site Infection Prevention, Anesthesia Post-op Instructions and Care and Recovery After Surgery       Arthroscopic Knee Ligament Repair, Care After This sheet gives  you information about how to care for yourself after your procedure. Your health care provider may also give you more specific instructions. If you have problems or questions, contact your health care provider. What can I expect after the procedure? After the procedure, it is common to have:  Soreness or pain in your knee.  Bruising and swelling on your knee, calf, and ankle for 3-4 days.  A small amount of fluid coming from the incisions. Follow these instructions at home: Medicines  Take over-the-counter and prescription medicines only as told by your health care provider.  Ask your health care provider if the medicine prescribed to you: ? Requires you to avoid driving or using machinery. ? Can cause constipation. You may need to take these actions to prevent or treat constipation:  Drink enough fluid to keep your urine pale yellow.  Take over-the-counter or prescription medicines.  Eat foods that are high in fiber, such as beans, whole grains, and fresh fruits and vegetables.  Limit foods that are high in fat and processed sugars, such as fried or sweet foods. If you have a brace or immobilizer:  Wear it as told by your health care provider. Remove it only as told by your health care  provider.  Loosen it if your toes tingle, become numb, or turn cold and blue.  Keep it clean and dry.  Ask your health care provider when it is safe to drive. Bathing  Do not take baths, swim, or use a hot tub until your health care provider approves.  Keep your bandage (dressing) dry until your health care provider says that it can be removed.  If the brace or immobilizer is not waterproof: ? Do not let it get wet. ? Cover it with a watertight covering when you take a bath or shower. Incision care  Follow instructions from your health care provider about how to take care of your incisions. Make sure you: ? Wash your hands with soap and water for at least 20 seconds before and after you  change your dressing. If soap and water are not available, use hand sanitizer. ? Change your dressing as told by your health care provider. ? Leave stitches (sutures), skin glue, or adhesive strips in place. These skin closures may need to stay in place for 2 weeks or longer. If adhesive strip edges start to loosen and curl up, you may trim the loose edges. Do not remove adhesive strips completely unless your health care provider tells you to do that.  Check your incision areas every day for signs of infection. Check for: ? Redness. ? More swelling or pain. ? Blood or more fluid. ? Warmth. ? Pus or a bad smell.   Managing pain, stiffness, and swelling  If directed, put ice on the affected area. To do this: ? If you have a removable brace or immobilizer, remove it as told by your health care provider. ? Put ice in a plastic bag. ? Place a towel between your skin and the bag. ? Leave the ice on for 20 minutes, 2-3 times a day. ? Remove the ice if your skin turns bright red. This is very important. If you cannot feel pain, heat, or cold, you have a greater risk of damage to the area.  Move your toes often to reduce stiffness and swelling.  Raise (elevate) the injured area above the level of your heart while you are sitting or lying down.   Activity  Do not use your knee to support your body weight until your health care provider says that you can. Use crutches or other devices as told by your health care provider.  Do physical therapy exercises as told by your health care provider. Physical therapy will help you regain movement and strength in your knee.  Follow instructions from your health care provider about: ? When you may start motion exercises. ? When you may start riding a stationary bike and doing other low-impact activities. ? When you may start to jog and do other high-impact activities.  Do not lift anything that is heavier than 10 lb (4.5 kg), or the limit that you are told,  until your health care provider says that it is safe.  Ask your health care provider what activities are safe for you. General instructions  Do not use any products that contain nicotine or tobacco, such as cigarettes, e-cigarettes, and chewing tobacco. These can delay healing. If you need help quitting, ask your health care provider.  Wear compression stockings as told by your health care provider. These stockings help to prevent blood clots and reduce swelling in your legs.  Keep all follow-up visits. This is important. Contact a health care provider if:  You have any of these  signs of infection: ? Redness around an incision. ? Blood or more fluid coming from an incision. ? Warmth coming from an incision. ? Pus or a bad smell coming from an incision. ? More swelling or pain in your knee. ? A fever or chills.  You have pain that does not get better with medicine.  Your incision opens up. Get help right away if:  You have trouble breathing.  You have chest pain.  You have increased pain or swelling in your calf or at the back of your knee.  You have numbness and tingling near the knee joint or in the foot, ankle, or toes.  You notice that your foot or toes look darker than normal or are cooler than normal. These symptoms may represent a serious problem that is an emergency. Do not wait to see if the symptoms will go away. Get medical help right away. Call your local emergency services (911 in the [place] Do not drive yourself to the hospital. Summary  After the procedure, it is common to have knee pain with bruising and swelling on your knee, calf, and ankle.  Icing your knee and raising your leg above the level of your heart will help control the pain and swelling.  Do physical therapy exercises as told by your health care provider. Physical therapy will help you regain movement and strength in your knee. This information is not intended to replace advice given to you by  your health care provider. Make sure you discuss any questions you have with your health care provider. Document Revised: 07/02/2019 Document Reviewed: 07/02/2019 Elsevier Patient Education  [place] Anesthesia, Adult, Care After This sheet gives you information about how to care for yourself after your procedure. Your health care provider may also give you more specific instructions. If you have problems or questions, contact your health care provider. What can I expect after the procedure? After the procedure, the following side effects are common:  Pain or discomfort at the IV site.  Nausea.  Vomiting.  Sore throat.  Trouble concentrating.  Feeling cold or chills.  Feeling weak or tired.  Sleepiness and fatigue.  Soreness and body aches. These side effects can affect parts of the body that were not involved in surgery. Follow these instructions at home: For the time period you were told by your health care provider:  Rest.  Do not participate in activities where you could fall or become injured.  Do not drive or use machinery.  Do not drink alcohol.  Do not take sleeping pills or medicines that cause drowsiness.  Do not make important decisions or sign legal documents.  Do not take care of children on your own.   Eating and drinking  Follow any instructions from your health care provider about eating or drinking restrictions.  When you feel hungry, start by eating small amounts of foods that are soft and easy to digest (bland), such as toast. Gradually return to your regular diet.  Drink enough fluid to keep your urine pale yellow.  If you vomit, rehydrate by drinking water, juice, or clear broth. General instructions  If you have sleep apnea, surgery and certain medicines can increase your risk for breathing problems. Follow instructions from your health care provider about wearing your sleep device: ? Anytime you are sleeping, including  during daytime naps. ? While taking prescription pain medicines, sleeping medicines, or medicines that make you drowsy.  Have a responsible adult stay with you for the  time you are told. It is important to have someone help care for you until you are awake and alert.  Return to your normal activities as told by your health care provider. Ask your health care provider what activities are safe for you.  Take over-the-counter and prescription medicines only as told by your health care provider.  If you smoke, do not smoke without supervision.  Keep all follow-up visits as told by your health care provider. This is important. Contact a health care provider if:  You have nausea or vomiting that does not get better with medicine.  You cannot eat or drink without vomiting.  You have pain that does not get better with medicine.  You are unable to pass urine.  You develop a skin rash.  You have a fever.  You have redness around your IV site that gets worse. Get help right away if:  You have difficulty breathing.  You have chest pain.  You have blood in your urine or stool, or you vomit blood. Summary  After the procedure, it is common to have a sore throat or nausea. It is also common to feel tired.  Have a responsible adult stay with you for the time you are told. It is important to have someone help care for you until you are awake and alert.  When you feel hungry, start by eating small amounts of foods that are soft and easy to digest (bland), such as toast. Gradually return to your regular diet.  Drink enough fluid to keep your urine pale yellow.  Return to your normal activities as told by your health care provider. Ask your health care provider what activities are safe for you. This information is not intended to replace advice given to you by your health care provider. Make sure you discuss any questions you have with your health care provider. Document Revised: 10/18/2019  Document Reviewed: 05/17/2019 Elsevier Patient Education  2021 [employer].

## 2020-03-12 ENCOUNTER — Other Ambulatory Visit (HOSPITAL_COMMUNITY)
Admission: RE | Admit: 2020-03-12 | Discharge: 2020-03-12 | Disposition: A | Discharge status: Home / Self Care | Payer: Medicare Other | Source: Ambulatory Visit | Attending: Orthopedic Surgery | Admitting: Orthopedic Surgery

## 2020-03-12 ENCOUNTER — Encounter (HOSPITAL_COMMUNITY)
Admission: RE | Admit: 2020-03-12 | Discharge: 2020-03-12 | Disposition: A | Discharge status: Home / Self Care | Payer: Medicare Other | Source: Ambulatory Visit | Attending: Orthopedic Surgery | Admitting: Orthopedic Surgery

## 2020-03-12 ENCOUNTER — Other Ambulatory Visit: Payer: Self-pay

## 2020-03-12 ENCOUNTER — Encounter (HOSPITAL_COMMUNITY): Payer: Self-pay

## 2020-03-12 DIAGNOSIS — Z01812 Encounter for preprocedural laboratory examination: Secondary | ICD-10-CM | POA: Diagnosis not present

## 2020-03-12 DIAGNOSIS — Z20822 Contact with and (suspected) exposure to covid-19: Secondary | ICD-10-CM | POA: Insufficient documentation

## 2020-03-12 HISTORY — DX: Unspecified hearing loss, unspecified ear: H91.90

## 2020-03-12 LAB — CBC WITH DIFFERENTIAL/PLATELET
Abs Immature Granulocytes: 0.01 10*3/uL (ref 0.00–0.07)
Basophils Absolute: 0 10*3/uL (ref 0.0–0.1)
Basophils Relative: 1 %
Eosinophils Absolute: 0.1 10*3/uL (ref 0.0–0.5)
Eosinophils Relative: 2 %
HCT: 34.8 % — ABNORMAL LOW (ref 36.0–46.0)
Hemoglobin: 10.9 g/dL — ABNORMAL LOW (ref 12.0–15.0)
Immature Granulocytes: 0 %
Lymphocytes Relative: 42 %
Lymphs Abs: 1.1 10*3/uL (ref 0.7–4.0)
MCH: 31.9 pg (ref 26.0–34.0)
MCHC: 31.3 g/dL (ref 30.0–36.0)
MCV: 101.8 fL — ABNORMAL HIGH (ref 80.0–100.0)
Monocytes Absolute: 0.2 10*3/uL (ref 0.1–1.0)
Monocytes Relative: 7 %
Neutro Abs: 1.3 10*3/uL — ABNORMAL LOW (ref 1.7–7.7)
Neutrophils Relative %: 48 %
Platelets: 46 10*3/uL — ABNORMAL LOW (ref 150–400)
RBC: 3.42 MIL/uL — ABNORMAL LOW (ref 3.87–5.11)
RDW: 13.7 % (ref 11.5–15.5)
WBC: 2.7 10*3/uL — ABNORMAL LOW (ref 4.0–10.5)
nRBC: 0 % (ref 0.0–0.2)

## 2020-03-12 LAB — BASIC METABOLIC PANEL
Anion gap: 12 (ref 5–15)
BUN: 19 mg/dL (ref 8–23)
CO2: 23 mmol/L (ref 22–32)
Calcium: 9.4 mg/dL (ref 8.9–10.3)
Chloride: 107 mmol/L (ref 98–111)
Creatinine, Ser: 0.89 mg/dL (ref 0.44–1.00)
GFR, Estimated: >60 mL/min (ref >60)
Glucose, Bld: 66 mg/dL — ABNORMAL LOW (ref 70–99)
Potassium: 4.1 mmol/L (ref 3.5–5.1)
Sodium: 142 mmol/L (ref 135–145)

## 2020-03-12 LAB — SARS CORONAVIRUS 2 (TAT 6-24 HRS): SARS Coronavirus 2: NEGATIVE

## 2020-03-12 LAB — HEMOGLOBIN A1C
Hgb A1c MFr Bld: 6.3 % — ABNORMAL HIGH (ref 4.8–5.6)
Mean Plasma Glucose: 134.11 mg/dL

## 2020-03-13 NOTE — H&P (Signed)
Chief Complaint  Patient presents with  . Knee Pain      S/p left knee 07/10/2019. No swelling, still hurting,           Encounter Diagnoses  Name Primary?  . S/P left knee arthroscopy 07/10/19 Yes  . Bone bruise    . Chronic pain of left knee    . Stress reaction of bone    . Primary localized osteoarthritis of knee left knee    . 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          80 year old female had knee arthroscopy in May 2022 postoperatively we treated her for her bone lesion which was a stress fracture in the medial tibia however she continued complain of pain repeat x-rays did not show any necrosis she finally went for MRI and she now has a recurrent tear of the medial meniscus with healing of the bone stress reaction   She is interested in having arthroscopic surgery         MRI shows an obvious medial meniscal tear grade 4 lesion of the medial femoral condyle and near resolution of the medial tibial stress reaction   Report is included MRI: Patellofemoral:  Diffuse cartilage thinning without focal defect.   Medial: Near full-thickness cartilage loss over the central weight-bearing medial femoral condyle and medial tibial plateau. Small subchondral cystic change and faint marrow edema in the medial tibial plateau.   Lateral:  No chondral defect.   Joint:  Small joint effusion.  Normal Hoffa's fat.   Popliteal Fossa:  No Baker cyst. Intact popliteus tendon.   Extensor Mechanism: Intact quadriceps tendon and patellar tendon. Intact medial and lateral patellar retinaculum. Intact MPFL.   Bones: No acute fracture or dislocation. No suspicious bone lesion.   Other: None.   IMPRESSION: 1. Diminutive medial meniscus body with large radial tear. Additional horizontal tear of the posterior horn. 2. Moderate medial and mild patellofemoral compartment osteoarthritis. 3. Small joint effusion.     Electronically Signed   By: Titus Dubin  M.D.   On: 01/15/2020 16:36   The procedure has been fully reviewed with the patient; The risks and benefits of surgery have been discussed and explained and understood. Alternative treatment has also been reviewed, questions were encouraged and answered. The postoperative plan is also been reviewed.     Arthroscopic surgery left knee medial meniscectomy

## 2020-03-14 ENCOUNTER — Ambulatory Visit (HOSPITAL_COMMUNITY): Payer: Medicare Other | Admitting: Anesthesiology

## 2020-03-14 ENCOUNTER — Other Ambulatory Visit: Payer: Self-pay

## 2020-03-14 ENCOUNTER — Encounter: Payer: Self-pay | Admitting: Orthopedic Surgery

## 2020-03-14 ENCOUNTER — Ambulatory Visit (HOSPITAL_COMMUNITY)
Admission: RE | Admit: 2020-03-14 | Discharge: 2020-03-14 | Disposition: A | Discharge status: Home / Self Care | Payer: Medicare Other | Attending: Orthopedic Surgery | Admitting: Orthopedic Surgery

## 2020-03-14 ENCOUNTER — Encounter (HOSPITAL_COMMUNITY): Payer: Self-pay | Admitting: Orthopedic Surgery

## 2020-03-14 ENCOUNTER — Encounter (HOSPITAL_COMMUNITY)
Admission: RE | Disposition: A | Discharge status: Home / Self Care | Payer: Self-pay | Source: Home / Self Care | Attending: Orthopedic Surgery

## 2020-03-14 DIAGNOSIS — X58XXXA Exposure to other specified factors, initial encounter: Secondary | ICD-10-CM | POA: Insufficient documentation

## 2020-03-14 DIAGNOSIS — M23204 Derangement of unspecified medial meniscus due to old tear or injury, left knee: Secondary | ICD-10-CM | POA: Diagnosis not present

## 2020-03-14 DIAGNOSIS — M1712 Unilateral primary osteoarthritis, left knee: Secondary | ICD-10-CM

## 2020-03-14 DIAGNOSIS — I10 Essential (primary) hypertension: Secondary | ICD-10-CM | POA: Diagnosis not present

## 2020-03-14 DIAGNOSIS — S83242A Other tear of medial meniscus, current injury, left knee, initial encounter: Secondary | ICD-10-CM | POA: Diagnosis not present

## 2020-03-14 DIAGNOSIS — S83209A Unspecified tear of unspecified meniscus, current injury, unspecified knee, initial encounter: Secondary | ICD-10-CM

## 2020-03-14 DIAGNOSIS — E119 Type 2 diabetes mellitus without complications: Secondary | ICD-10-CM | POA: Diagnosis not present

## 2020-03-14 DIAGNOSIS — M23304 Other meniscus derangements, unspecified medial meniscus, left knee: Secondary | ICD-10-CM | POA: Diagnosis not present

## 2020-03-14 HISTORY — PX: KNEE ARTHROSCOPY WITH MEDIAL MENISECTOMY: SHX5651

## 2020-03-14 LAB — GLUCOSE, CAPILLARY
Glucose-Capillary: 102 mg/dL — ABNORMAL HIGH (ref 70–99)
Glucose-Capillary: 136 mg/dL — ABNORMAL HIGH (ref 70–99)

## 2020-03-14 SURGERY — ARTHROSCOPY, KNEE, WITH MEDIAL MENISCECTOMY
Anesthesia: General | Site: Knee | Laterality: Left

## 2020-03-14 MED ORDER — BUPIVACAINE-EPINEPHRINE (PF) 0.5% -1:200000 IJ SOLN
INTRAMUSCULAR | Status: DC | PRN
  Administered 2020-03-14 (×2): 30 mL

## 2020-03-14 MED ORDER — VANCOMYCIN HCL IN DEXTROSE 1-5 GM/200ML-% IV SOLN
1000.0000 mg | INTRAVENOUS | Status: AC
  Administered 2020-03-14: 1000 mg via INTRAVENOUS

## 2020-03-14 MED ORDER — PROPOFOL 10 MG/ML IV BOLUS
INTRAVENOUS | Status: DC | PRN
  Administered 2020-03-14: 110 mg via INTRAVENOUS

## 2020-03-14 MED ORDER — FENTANYL CITRATE (PF) 100 MCG/2ML IJ SOLN
INTRAMUSCULAR | Status: AC
  Filled 2020-03-14: qty 2

## 2020-03-14 MED ORDER — ONDANSETRON HCL 4 MG/2ML IJ SOLN
INTRAMUSCULAR | Status: DC | PRN
  Administered 2020-03-14: 4 mg via INTRAVENOUS

## 2020-03-14 MED ORDER — EPHEDRINE SULFATE 50 MG/ML IJ SOLN
INTRAMUSCULAR | Status: DC | PRN
  Administered 2020-03-14 (×2): 10 mg via INTRAVENOUS

## 2020-03-14 MED ORDER — ORAL CARE MOUTH RINSE: 15.0000 mL | Freq: Once | OROMUCOSAL | Status: AC

## 2020-03-14 MED ORDER — EPHEDRINE 5 MG/ML INJ
INTRAVENOUS | Status: AC
  Filled 2020-03-14: qty 10

## 2020-03-14 MED ORDER — ONDANSETRON HCL 4 MG/2ML IJ SOLN
4.0000 mg | Freq: Once | INTRAMUSCULAR | Status: AC
  Administered 2020-03-14: 4 mg via INTRAVENOUS
  Filled 2020-03-14: qty 2

## 2020-03-14 MED ORDER — ONDANSETRON HCL 4 MG/2ML IJ SOLN: 4.0000 mg | Freq: Once | INTRAMUSCULAR | Status: DC | PRN

## 2020-03-14 MED ORDER — LACTATED RINGERS IV SOLN: INTRAVENOUS | Status: DC

## 2020-03-14 MED ORDER — FENTANYL CITRATE (PF) 100 MCG/2ML IJ SOLN: 25.0000 ug | INTRAMUSCULAR | Status: DC | PRN

## 2020-03-14 MED ORDER — ONDANSETRON HCL 4 MG/2ML IJ SOLN
INTRAMUSCULAR | Status: AC
  Filled 2020-03-14: qty 2

## 2020-03-14 MED ORDER — BUPIVACAINE-EPINEPHRINE (PF) 0.5% -1:200000 IJ SOLN
INTRAMUSCULAR | Status: AC
  Filled 2020-03-14: qty 60

## 2020-03-14 MED ORDER — PROMETHAZINE HCL 12.5 MG PO TABS: 12.5000 mg | ORAL_TABLET | Freq: Four times a day (QID) | ORAL | 0 refills | Status: DC | PRN

## 2020-03-14 MED ORDER — ACETAMINOPHEN 500 MG PO TABS: 500.0000 mg | ORAL_TABLET | ORAL | Status: DC | PRN

## 2020-03-14 MED ORDER — VANCOMYCIN HCL IN DEXTROSE 1-5 GM/200ML-% IV SOLN
INTRAVENOUS | Status: AC
  Filled 2020-03-14: qty 200

## 2020-03-14 MED ORDER — HYDROCODONE-ACETAMINOPHEN 5-325 MG PO TABS
1.0000 | ORAL_TABLET | ORAL | 0 refills | Status: DC | PRN
Start: 2020-03-14 — End: 2020-03-19

## 2020-03-14 MED ORDER — LIDOCAINE HCL (CARDIAC) PF 50 MG/5ML IV SOSY
PREFILLED_SYRINGE | INTRAVENOUS | Status: DC | PRN
  Administered 2020-03-14: 60 mg via INTRAVENOUS

## 2020-03-14 MED ORDER — LIDOCAINE HCL (PF) 2 % IJ SOLN
INTRAMUSCULAR | Status: AC
  Filled 2020-03-14: qty 10

## 2020-03-14 MED ORDER — SODIUM CHLORIDE 0.9 % IR SOLN
Status: DC | PRN
  Administered 2020-03-14 (×2): 3000 mL

## 2020-03-14 MED ORDER — FENTANYL CITRATE (PF) 100 MCG/2ML IJ SOLN
INTRAMUSCULAR | Status: DC | PRN
  Administered 2020-03-14: 50 ug via INTRAVENOUS

## 2020-03-14 MED ORDER — EPINEPHRINE PF 1 MG/ML IJ SOLN
INTRAMUSCULAR | Status: AC
  Filled 2020-03-14: qty 3

## 2020-03-14 MED ORDER — CHLORHEXIDINE GLUCONATE 0.12 % MT SOLN
15.0000 mL | Freq: Once | OROMUCOSAL | Status: AC
  Administered 2020-03-14: 15 mL via OROMUCOSAL

## 2020-03-14 SURGICAL SUPPLY — 53 items
ABLATOR ASPIRATE 50D MULTI-PRT (SURGICAL WAND) IMPLANT
APL PRP STRL LF DISP 70% ISPRP (MISCELLANEOUS) ×1
BAG HAMPER (MISCELLANEOUS) ×2 IMPLANT
BLADE SHAVER TORPEDO 4X13 (MISCELLANEOUS) ×2 IMPLANT
BLADE SURG SZ11 CARB STEEL (BLADE) ×2 IMPLANT
BNDG CMPR MED 10X6 ELC LF (GAUZE/BANDAGES/DRESSINGS) ×1
BNDG CMPR STD VLCR NS LF 5.8X6 (GAUZE/BANDAGES/DRESSINGS) ×1
BNDG ELASTIC 6X10 VLCR STRL LF (GAUZE/BANDAGES/DRESSINGS) ×2 IMPLANT
BNDG ELASTIC 6X5.8 VLCR NS LF (GAUZE/BANDAGES/DRESSINGS) ×2 IMPLANT
CHLORAPREP W/TINT 26 (MISCELLANEOUS) ×2 IMPLANT
CLOTH BEACON ORANGE TIMEOUT ST (SAFETY) ×2 IMPLANT
COOLER ICEMAN CLASSIC (MISCELLANEOUS) ×2 IMPLANT
COVER WAND RF STERILE (DRAPES) ×4 IMPLANT
CUFF TOURN SGL QUICK 24 (TOURNIQUET CUFF) ×2
CUFF TOURN SGL QUICK 34 (TOURNIQUET CUFF)
CUFF TRNQT CYL 24X4X16.5-23 (TOURNIQUET CUFF) ×1 IMPLANT
CUFF TRNQT CYL 34X4.125X (TOURNIQUET CUFF) IMPLANT
DECANTER SPIKE VIAL GLASS SM (MISCELLANEOUS) ×4 IMPLANT
DISSECTOR 4.0MM X 13CM (MISCELLANEOUS) ×2 IMPLANT
DRAPE HALF SHEET 40X57 (DRAPES) ×2 IMPLANT
GAUZE 4X4 16PLY RFD (DISPOSABLE) ×2 IMPLANT
GAUZE SPONGE 4X4 12PLY STRL (GAUZE/BANDAGES/DRESSINGS) ×4 IMPLANT
GAUZE SPONGE 4X4 16PLY XRAY LF (GAUZE/BANDAGES/DRESSINGS) ×2 IMPLANT
GAUZE XEROFORM 5X9 LF (GAUZE/BANDAGES/DRESSINGS) ×2 IMPLANT
GLOVE SKINSENSE NS SZ8.0 LF (GLOVE) ×1
GLOVE SKINSENSE STRL SZ8.0 LF (GLOVE) ×1 IMPLANT
GLOVE SS N UNI LF 8.5 STRL (GLOVE) ×2 IMPLANT
GLOVE SURG UNDER POLY LF SZ7 (GLOVE) ×4 IMPLANT
GOWN STRL REUS W/TWL LRG LVL3 (GOWN DISPOSABLE) ×2 IMPLANT
GOWN STRL REUS W/TWL XL LVL3 (GOWN DISPOSABLE) ×2 IMPLANT
IV NS IRRIG 3000ML ARTHROMATIC (IV SOLUTION) ×4 IMPLANT
KIT BLADEGUARD II DBL (SET/KITS/TRAYS/PACK) ×2 IMPLANT
KIT TURNOVER CYSTO (KITS) ×2 IMPLANT
MANIFOLD NEPTUNE II (INSTRUMENTS) ×2 IMPLANT
MARKER SKIN DUAL TIP RULER LAB (MISCELLANEOUS) ×2 IMPLANT
NEEDLE HYPO 18GX1.5 BLUNT FILL (NEEDLE) ×2 IMPLANT
NEEDLE HYPO 21X1.5 SAFETY (NEEDLE) ×2 IMPLANT
NEEDLE SPNL 18GX3.5 QUINCKE PK (NEEDLE) ×2 IMPLANT
NS IRRIG 1000ML POUR BTL (IV SOLUTION) ×2 IMPLANT
PACK ARTHRO LIMB DRAPE STRL (MISCELLANEOUS) ×2 IMPLANT
PAD ABD 5X9 TENDERSORB (GAUZE/BANDAGES/DRESSINGS) ×2 IMPLANT
PAD ABD 8X10 STRL (GAUZE/BANDAGES/DRESSINGS) ×2 IMPLANT
PAD ARMBOARD 7.5X6 YLW CONV (MISCELLANEOUS) ×2 IMPLANT
PAD COLD SHLDR WRAP-ON (PAD) ×2 IMPLANT
PADDING CAST COTTON 6X4 STRL (CAST SUPPLIES) ×4 IMPLANT
PORT APPOLLO RF 90DEGREE MULTI (SURGICAL WAND) IMPLANT
SET ARTHROSCOPY INST (INSTRUMENTS) ×2 IMPLANT
SET BASIN LINEN APH (SET/KITS/TRAYS/PACK) ×2 IMPLANT
SUT ETHILON 3 0 FSL (SUTURE) ×2 IMPLANT
SYR 10ML LL (SYRINGE) ×2 IMPLANT
SYR 30ML LL (SYRINGE) ×2 IMPLANT
TUBE CONNECTING 12X1/4 (SUCTIONS) ×4 IMPLANT
TUBING IN/OUT FLOW W/MAIN PUMP (TUBING) ×2 IMPLANT

## 2020-03-14 NOTE — Op Note (Signed)
03/14/2020 10:18 AM  Operative report  Preop diagnosis recurrent tear left medial meniscus  Postop diagnosis same, degenerative arthritis left knee  Procedure partial medial meniscectomy  Surgeon Aline Brochure  Anesthesia General  Operative findings small superior surface tear posterior horn medial meniscus extensive degenerative arthritis medial compartment moderate general arthritis patellofemoral joint lateral compartment normal  Previous meniscal resection look to be in good condition however there was a tear on the superior surface of the medial meniscus at the posterior horn  Procedure was done as follows  Patient was seen in preop and evaluated.  She was cleared for surgery.  The left knee was confirmed and marked for surgery chart was reviewed including images  Patient was brought to the operating room placed in supine position where general anesthesia after that her left leg was prepped and draped sterilely followed by timeout  Standard medial lateral portals were established and the scope was placed into the joint through the lateral portal.  Diagnostic arthroscopy was performed with a circumferential tour of the joint  Through the medial portal a probe was placed in the meniscal surfaces were reevaluated.  A straight biter was used to trim the meniscus where the superior tear was located and this was balanced to a stable rim with removal of cartilage fragments using a straight shaver blade  The joint was then irrigated and closed with 3-0 nylon sutures total of 60 cc of Marcaine with epinephrine was placed in the joint  Sterile dressing was applied along with a Cryo/Cuff which was activated  Patient was extubated taken to recovery room in stable condition

## 2020-03-14 NOTE — Anesthesia Postprocedure Evaluation (Signed)
Anesthesia Post Note  Patient: Jeanette Chang  Procedure(s) Performed: KNEE ARTHROSCOPY WITH MEDIAL MENISCECTOMY (Left Knee)  Patient location during evaluation: PACU Anesthesia Type: General Level of consciousness: awake and alert and oriented Pain management: pain level controlled Vital Signs Assessment: post-procedure vital signs reviewed and stable Respiratory status: spontaneous breathing Cardiovascular status: blood pressure returned to baseline and stable Postop Assessment: no apparent nausea or vomiting Anesthetic complications: no   No complications documented.   Last Vitals:  Vitals:   03/14/20 1100 03/14/20 1111  BP: (!) 132/47 (!) 112/55  Pulse: 71 65  Resp: 13 17  Temp: 36.6 C 36.6 C  SpO2: 98% 96%    Last Pain:  Vitals:   03/14/20 1111  TempSrc: Oral  PainSc: 3                  Raudel Bazen

## 2020-03-14 NOTE — Anesthesia Preprocedure Evaluation (Signed)
Anesthesia Evaluation  Patient identified by MRN, date of birth, ID band Patient awake    Reviewed: Allergy & Precautions, H&P , NPO status , Patient's Chart, lab work & pertinent test results, reviewed documented beta blocker date and time   Airway Mallampati: II  TM Distance: >3 FB Neck ROM: full    Dental no notable dental hx.    Pulmonary neg pulmonary ROS, former smoker,    Pulmonary exam normal breath sounds clear to auscultation       Cardiovascular Exercise Tolerance: Good hypertension, negative cardio ROS   Rhythm:regular Rate:Normal     Neuro/Psych  Headaches, negative psych ROS   GI/Hepatic negative GI ROS, (+) Hepatitis -, Autoimmune  Endo/Other  negative endocrine ROSdiabetes  Renal/GU negative Renal ROS  negative genitourinary   Musculoskeletal   Abdominal   Peds  Hematology  (+) Blood dyscrasia, anemia ,   Anesthesia Other Findings   Reproductive/Obstetrics negative OB ROS                             Anesthesia Physical Anesthesia Plan  ASA: III  Anesthesia Plan: General   Post-op Pain Management:    Induction:   PONV Risk Score and Plan: Ondansetron  Airway Management Planned:   Additional Equipment:   Intra-op Plan:   Post-operative Plan:   Informed Consent: I have reviewed the patients History and Physical, chart, labs and discussed the procedure including the risks, benefits and alternatives for the proposed anesthesia with the patient or authorized representative who has indicated his/her understanding and acceptance.     Dental Advisory Given  Plan Discussed with: CRNA  Anesthesia Plan Comments:         Anesthesia Quick Evaluation

## 2020-03-14 NOTE — Interval H&P Note (Signed)
History and Physical Interval Note:  03/14/2020 8:48 AM  Jeanette Chang  has presented today for surgery, with the diagnosis of Torn medial meniscus left knee.  The various methods of treatment have been discussed with the patient and family. After consideration of risks, benefits and other options for treatment, the patient has consented to  Procedure(s): KNEE ARTHROSCOPY WITH MEDIAL MENISCECTOMY (Left) as a surgical intervention.  The patient's history has been reviewed, patient examined, no change in status, stable for surgery.  I have reviewed the patient's chart and labs.  Questions were answered to the patient's satisfaction.     Arther Abbott

## 2020-03-14 NOTE — Brief Op Note (Signed)
03/14/2020  10:17 AM  PATIENT:  Jeanette Chang  80 y.o. female  PRE-OPERATIVE DIAGNOSIS:  Torn medial meniscus left knee  POST-OPERATIVE DIAGNOSIS:  Torn medial meniscus left knee  PROCEDURE:  Procedure(s): KNEE ARTHROSCOPY WITH MEDIAL MENISCECTOMY (Left)  SURGEON:  Surgeon(s) and Role:    Carole Civil, MD - Primary  PHYSICIAN ASSISTANT:   ASSISTANTS: none   ANESTHESIA:   general  EBL:  0 mL   BLOOD ADMINISTERED:none  DRAINS: none   LOCAL MEDICATIONS USED:  MARCAINE     SPECIMEN:  No Specimen  DISPOSITION OF SPECIMEN:  N/A  COUNTS:  YES  TOURNIQUET:  * Missing tourniquet times found for documented tourniquets in log: [id number] *  DICTATION: .Viviann Spare Dictation  PLAN OF CARE: Discharge to home after PACU  PATIENT DISPOSITION:  PACU - hemodynamically stable.   Delay start of Pharmacological VTE agent (>24hrs) due to surgical blood loss or risk of bleeding: not applicable

## 2020-03-14 NOTE — Transfer of Care (Signed)
Immediate Anesthesia Transfer of Care Note  Patient: Samaiyah Howes  Procedure(s) Performed: KNEE ARTHROSCOPY WITH MEDIAL MENISCECTOMY (Left Knee)  Patient Location: PACU  Anesthesia Type:General  Level of Consciousness: awake  Airway & Oxygen Therapy: Patient Spontanous Breathing  Post-op Assessment: Report given to RN  Post vital signs: Reviewed and stable  Last Vitals:  Vitals Value Taken Time  BP 123/50 03/14/20 1023  Temp    Pulse 78 03/14/20 1025  Resp 13 03/14/20 1025  SpO2 98 % 03/14/20 1025  Vitals shown include unvalidated device data.  Last Pain:  Vitals:   03/14/20 0717  TempSrc: Oral  PainSc: 6          Complications: No complications documented.

## 2020-03-14 NOTE — Anesthesia Procedure Notes (Signed)
Procedure Name: LMA Insertion Date/Time: 03/14/2020 9:32 AM Performed by: Ollen Bowl, CRNA Pre-anesthesia Checklist: Patient identified, Patient being monitored, Emergency Drugs available, Timeout performed and Suction available Patient Re-evaluated:Patient Re-evaluated prior to induction Oxygen Delivery Method: Circle System Utilized Preoxygenation: Pre-oxygenation with 100% oxygen Induction Type: IV induction Ventilation: Mask ventilation without difficulty LMA: LMA inserted LMA Size: 3.0 Number of attempts: 1 Placement Confirmation: positive ETCO2 and breath sounds checked- equal and bilateral

## 2020-03-17 ENCOUNTER — Encounter (HOSPITAL_COMMUNITY): Payer: Self-pay | Admitting: Orthopedic Surgery

## 2020-03-17 DIAGNOSIS — I10 Essential (primary) hypertension: Secondary | ICD-10-CM | POA: Diagnosis not present

## 2020-03-17 DIAGNOSIS — M1712 Unilateral primary osteoarthritis, left knee: Secondary | ICD-10-CM | POA: Diagnosis not present

## 2020-03-17 DIAGNOSIS — E1165 Type 2 diabetes mellitus with hyperglycemia: Secondary | ICD-10-CM | POA: Diagnosis not present

## 2020-03-17 MED ORDER — SODIUM CHLORIDE 0.9 % IR SOLN
Status: AC | PRN
  Administered 2020-03-14: 1000 mL

## 2020-03-19 ENCOUNTER — Ambulatory Visit (INDEPENDENT_AMBULATORY_CARE_PROVIDER_SITE_OTHER): Payer: Medicare Other | Admitting: Orthopedic Surgery

## 2020-03-19 ENCOUNTER — Other Ambulatory Visit: Payer: Self-pay

## 2020-03-19 DIAGNOSIS — Z9889 Other specified postprocedural states: Secondary | ICD-10-CM

## 2020-03-19 MED ORDER — HYDROCODONE-ACETAMINOPHEN 5-325 MG PO TABS: 1.0000 | ORAL_TABLET | ORAL | 0 refills | Status: AC | PRN

## 2020-03-19 NOTE — Patient Instructions (Signed)
Ice the knee   Knee bends  Wear brace   Use the walker for 6 weeks

## 2020-03-19 NOTE — Progress Notes (Addendum)
Chief Complaint  Patient presents with  . Routine Post Op    Lt knee DOS 03/14/20  . Medication Refill    Hydrocodone    Encounter Diagnosis  Name Primary?  . S/P right knee arthroscopy January 2022 Yes     Preop diagnosis recurrent tear left medial meniscus  Postop diagnosis same, degenerative arthritis left knee  Procedure partial medial meniscectomy  Surgeon Aline Brochure  Anesthesia General  Operative findings small superior surface tear posterior horn medial meniscus extensive degenerative arthritis medial compartment moderate general arthritis patellofemoral joint lateral compartment normal  Previous meniscal resection look to be in good condition however there was a tear on the superior surface of the medial meniscus at the posterior horn   Sarely is stable condition portals are clean and sutures were extracted  She has degenerative arthritis will need a knee replacement this is complicated by the fact that she has a low platelet count we will need hematology to get involved  She does have supportive home with her daughter who can take care of her 24/7  In the meantime she should use the brace and a walker for a total of 6weeks she can start an exercise program 50 knee bends a day and follow-up with me in 6 weeks

## 2020-03-24 DIAGNOSIS — D508 Other iron deficiency anemias: Secondary | ICD-10-CM | POA: Diagnosis not present

## 2020-03-24 DIAGNOSIS — D708 Other neutropenia: Secondary | ICD-10-CM | POA: Diagnosis not present

## 2020-03-24 DIAGNOSIS — E538 Deficiency of other specified B group vitamins: Secondary | ICD-10-CM | POA: Diagnosis not present

## 2020-03-24 DIAGNOSIS — D509 Iron deficiency anemia, unspecified: Secondary | ICD-10-CM | POA: Diagnosis not present

## 2020-03-27 DIAGNOSIS — D696 Thrombocytopenia, unspecified: Secondary | ICD-10-CM | POA: Diagnosis not present

## 2020-03-27 DIAGNOSIS — Z6824 Body mass index (BMI) 24.0-24.9, adult: Secondary | ICD-10-CM | POA: Diagnosis not present

## 2020-03-27 DIAGNOSIS — Z9889 Other specified postprocedural states: Secondary | ICD-10-CM | POA: Diagnosis not present

## 2020-03-27 DIAGNOSIS — Z7189 Other specified counseling: Secondary | ICD-10-CM | POA: Diagnosis not present

## 2020-03-27 DIAGNOSIS — S83232D Complex tear of medial meniscus, current injury, left knee, subsequent encounter: Secondary | ICD-10-CM | POA: Diagnosis not present

## 2020-03-27 DIAGNOSIS — D500 Iron deficiency anemia secondary to blood loss (chronic): Secondary | ICD-10-CM | POA: Diagnosis not present

## 2020-03-27 DIAGNOSIS — K7460 Unspecified cirrhosis of liver: Secondary | ICD-10-CM | POA: Diagnosis not present

## 2020-04-03 DIAGNOSIS — M25569 Pain in unspecified knee: Secondary | ICD-10-CM | POA: Diagnosis not present

## 2020-04-03 DIAGNOSIS — G8194 Hemiplegia, unspecified affecting left nondominant side: Secondary | ICD-10-CM | POA: Diagnosis not present

## 2020-04-03 DIAGNOSIS — Z6824 Body mass index (BMI) 24.0-24.9, adult: Secondary | ICD-10-CM | POA: Diagnosis not present

## 2020-04-03 DIAGNOSIS — Z87891 Personal history of nicotine dependence: Secondary | ICD-10-CM | POA: Diagnosis not present

## 2020-04-03 DIAGNOSIS — E1165 Type 2 diabetes mellitus with hyperglycemia: Secondary | ICD-10-CM | POA: Diagnosis not present

## 2020-04-03 DIAGNOSIS — D696 Thrombocytopenia, unspecified: Secondary | ICD-10-CM | POA: Diagnosis not present

## 2020-04-03 DIAGNOSIS — Z299 Encounter for prophylactic measures, unspecified: Secondary | ICD-10-CM | POA: Diagnosis not present

## 2020-04-03 DIAGNOSIS — I10 Essential (primary) hypertension: Secondary | ICD-10-CM | POA: Diagnosis not present

## 2020-04-08 ENCOUNTER — Other Ambulatory Visit: Payer: Self-pay | Admitting: Orthopedic Surgery

## 2020-04-09 ENCOUNTER — Other Ambulatory Visit: Payer: Self-pay | Admitting: Orthopedic Surgery

## 2020-04-09 MED ORDER — HYDROCODONE-ACETAMINOPHEN 5-325 MG PO TABS: 1.0000 | ORAL_TABLET | Freq: Four times a day (QID) | ORAL | 0 refills | Status: DC | PRN

## 2020-04-09 NOTE — Telephone Encounter (Signed)
Patient requests prescription for Hydrocodone/Acetaminophen 5-325  Mgs.  Qty  30   Sig: Take 1 tablet by mouth every 4 (four) hours as needed for up to 5 days for moderate pain.  Patient states she uses [place]

## 2020-04-14 ENCOUNTER — Ambulatory Visit (INDEPENDENT_AMBULATORY_CARE_PROVIDER_SITE_OTHER): Payer: Medicare Other | Admitting: Gastroenterology

## 2020-04-14 DIAGNOSIS — M1712 Unilateral primary osteoarthritis, left knee: Secondary | ICD-10-CM | POA: Diagnosis not present

## 2020-04-14 DIAGNOSIS — H43811 Vitreous degeneration, right eye: Secondary | ICD-10-CM | POA: Diagnosis not present

## 2020-04-14 DIAGNOSIS — E1165 Type 2 diabetes mellitus with hyperglycemia: Secondary | ICD-10-CM | POA: Diagnosis not present

## 2020-04-14 DIAGNOSIS — H353132 Nonexudative age-related macular degeneration, bilateral, intermediate dry stage: Secondary | ICD-10-CM | POA: Diagnosis not present

## 2020-04-14 DIAGNOSIS — I10 Essential (primary) hypertension: Secondary | ICD-10-CM | POA: Diagnosis not present

## 2020-04-17 ENCOUNTER — Encounter (INDEPENDENT_AMBULATORY_CARE_PROVIDER_SITE_OTHER): Payer: Self-pay | Admitting: Gastroenterology

## 2020-04-17 ENCOUNTER — Ambulatory Visit (INDEPENDENT_AMBULATORY_CARE_PROVIDER_SITE_OTHER): Payer: Medicare Other | Admitting: Gastroenterology

## 2020-04-17 ENCOUNTER — Ambulatory Visit (INDEPENDENT_AMBULATORY_CARE_PROVIDER_SITE_OTHER): Payer: Medicare Other | Admitting: Orthopedic Surgery

## 2020-04-17 ENCOUNTER — Other Ambulatory Visit: Payer: Self-pay

## 2020-04-17 VITALS — BP 121/63 | HR 76 | Temp 97.9°F | Ht 63.0 in | Wt 135.0 lb

## 2020-04-17 VITALS — Ht 63.0 in | Wt 131.0 lb

## 2020-04-17 DIAGNOSIS — K7460 Unspecified cirrhosis of liver: Secondary | ICD-10-CM

## 2020-04-17 DIAGNOSIS — K7581 Nonalcoholic steatohepatitis (NASH): Secondary | ICD-10-CM

## 2020-04-17 DIAGNOSIS — R197 Diarrhea, unspecified: Secondary | ICD-10-CM | POA: Diagnosis not present

## 2020-04-17 DIAGNOSIS — Z9889 Other specified postprocedural states: Secondary | ICD-10-CM

## 2020-04-17 NOTE — Patient Instructions (Signed)
Schedule liver US Schedule EGD May 2022 Perform blood workup Reduce salt intake to <2 g per day Can take Tylenol max of 2 g per day (650 mg q8h) for pain Avoid NSAIDs for pain Avoid eating raw oysters or shellfish Can take Imodium as needed for diarrhea

## 2020-04-17 NOTE — Progress Notes (Unsigned)
[name], M.D. Gastroenterology & Hepatology [facility] [address] [place], [place] [address]  Primary Care Physician: Monico Blitz, [place] [address]  I will communicate my assessment and recommendations to the referring MD via EMR.  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 female with PMH NASH cirrhosis, diabetes, hyperlipidemia, who presents for follow up of cirrhosis.  The patient was last seen on 10/11/2019. At that time, the patient was being evaluated for iron deficiency anemia.  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  There was a recommendation to perform an exam with a duodenoscope.  Patient states that she has been feeling well.  She has had chronic diarrhea intermittently that improves with the use of Imodium, but most of the time she has 1-2 BMs. She has had some episodes of diarrhea for the last year, on average 3-4 times a month but this is very seldom. Denies any melena, hematochezia or rectal bleeding.  She has very occasional episodes of abdominal cramping. The patient denies having any nausea, vomiting, fever, chills, hematemesis, abdominal distention,  jaundice, pruritus or weight loss.  Notably, the patient is getting ready to undergo a left knee replacement as she has not improved with the arthroscopy she has had before.  Patient is taking oral iron now, her most recent Hb in late January was 11.2 with MCV of 95.  Cirrhosis related questions: Hematemesis/coffee ground emesis: No History of variceal bleeding: No Abdominal distention/worsening ascitesNo Fever/chills: No Episodes of confusion/disorientation: No Number of daily bowel movements:1-2 Prior  history of banding?: No Prior episodes of SBP: No Last time liver imaging was performed:10/08/2019 - no liver masses in [place]  Last EGD: 06/29/2019 - grade I EV, portal hypertensive gastropathy Last Colonoscopy: 06/2019 - two small cecal and SF polyps, one cecal 6 mm polyp. All polyps were TA. Hemorrhoids  Past Medical History: Past Medical History:  Diagnosis Date  . Abnormal LFTs   . Achilles bursitis or tendinitis   . Acute maxillary sinusitis   . Acute pharyngitis   . Anemia, unspecified   . Candidiasis of skin and nails   . Dermatophytosis of foot   . Dermatophytosis of the body   . Diverticulitis of colon (without mention of hemorrhage)(562.11)   . Edema   . Headache(784.0)   . HOH (hard of hearing)   . Malaise and fatigue   . 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 Osteoarthrosis, unspecified whether generalized or localized, unspecified site   . Other dyspnea and respiratory abnormality   . Other psoriasis   . Other specified disorders of rotator cuff syndrome of shoulder and allied disorders   . Pain in joint   . Pure hypercholesterolemia   . RUQ pain   . Spasm of back muscles   . Thrombocytopenia, unspecified ([place])   . Type II or unspecified type diabetes mellitus without mention of complication, not stated as uncontrolled   . Unspecified essential hypertension   . Unspecified sinusitis (chronic)   . Urinary incontinence     Past Surgical History: Past Surgical History:  Procedure Laterality Date  . APPENDECTOMY  1964  . BIOPSY  03/02/2017   Procedure: BIOPSY;  Surgeon: Rogene Houston, MD;  Location: [facility]  END[facility];  Service: Endoscopy;;  antral  . CAROTID ENDARTERECTOMY  2005   Left CEA  . CAROTID ENDARTERECTOMY  2009   Right CEA  . CATARACT EXTRACTION, BILATERAL Bilateral   . CHOLECYSTECTOMY  1982   Gall Bladder  . COLONOSCOPY  09  . COLONOSCOPY N/A 06/27/2019   Procedure: COLONOSCOPY;  Surgeon: Rogene Houston,  MD;  Location: [facility] ENDO SU[facility];  Service: Endoscopy;  Laterality: N/A;  . ESOPHAGOGASTRODUODENOSCOPY N/A 03/02/2017   Procedure: ESOPHAGOGASTRODUODENOSCOPY (EGD);  Surgeon: Rogene Houston, MD;  Location: [facility] END[facility];  Service: Endoscopy;  Laterality: N/A;  12:55  . ESOPHAGOGASTRODUODENOSCOPY N/A 06/27/2019   Procedure: ESOPHAGOGASTRODUODENOSCOPY (EGD);  Surgeon: Rogene Houston, MD;  Location[facility];  Service: Endoscopy;  Laterality: N/A;  730  . GIVENS CAPSULE STUDY N/A 10/24/2019   Procedure: GIVENS CAPSULE STUDY;  Surgeon: Rogene Houston, MD;  Location[facility];  Service: Endoscopy;  Laterality: N/A;  730  . KNEE ARTHROSCOPY WITH MEDIAL MENISECTOMY Left 07/10/2019   Procedure: KNEE ARTHROSCOPY WITH MEDIAL MENISCECTOMY;  Surgeon: Carole Civil, MD;  Location: [facility] ORS;  Service: Orthopedics;  Laterality: Left;  . KNEE ARTHROSCOPY WITH MEDIAL MENISECTOMY Left 03/14/2020   Procedure: KNEE ARTHROSCOPY WITH MEDIAL MENISCECTOMY;  Surgeon: Carole Civil, MD;  Location: [facility] ORS;  Service: Orthopedics;  Laterality: Left;  . NOSE SURGERY  1978  . PARATHYROIDECTOMY  1992  . POLYPECTOMY  03/02/2017   Procedure: POLYPECTOMY;  Surgeon: Rogene Houston, MD;  [facility];  Service: Endoscopy;;  gastric   . POLYPECTOMY  06/27/2019   Procedure: POLYPECTOMY;  Surgeon: Rogene Houston, MD;  Location[facility];  Service: Endoscopy;;  colon  . Power port  03/06/2009   Right upper chest   . TONSILLECTOMY    . TUMOR EXCISION Right August 24, 2013   [facility] Dr. Ellen Henri, ENT  . VAGINAL HYSTERECTOMY  1972    Family History: Family History  Problem Relation Age of Onset  . Heart attack Mother   . Cancer Mother   . Heart attack Father   . Cancer Sister     Social History: Social History   Tobacco Use  Smoking Status Former Smoker  . Packs/day: 1.00  . Years: 42.00  . Pack years: 42.00  . Types: Cigarettes  . Quit date: 03/12/1992  .  Years since quitting: 28.1  Smokeless Tobacco Former Systems developer  . Quit date: 12/16/1992   Social History   Substance and Sexual Activity  Alcohol Use Yes  . Alcohol/week: 1.0 standard drink  . Types: 1 Glasses of wine per week   Comment: very seldom   Social History   Substance and Sexual Activity  Drug Use No    Allergies: Allergies  Allergen Reactions  . Codeine Nausea And Vomiting and Rash  . Monascus Purpureus Went Yeast Other (See Comments)    Headaches, myalgias  . Niacin And Related Other (See Comments)    Headaches, myalgias  . Red Yeast Rice Other (See Comments)    Headaches, myalgias  . Actos [Pioglitazone Hydrochloride] Other (See Comments)    Severe headache  . Amaryl Nausea Only and Other (See Comments)    Severe headache (patient is tolerating 2 mg dosing)  . Iron Diarrhea  . [place] [Trospium Chloride] Nausea Only and Other (See Comments)    Severe headache  . Statins Other (See Comments)    Severe headache,  Hips and leg weakness that cause patient  not to be able to function as per patient.  Lisbeth Ply [Fesoterodine Fumarate] Other (See Comments)    Cramps, severe headache  . Norco [Hydrocodone-Acetaminophen] Nausea And Vomiting and Anxiety  . Penicillins Swelling, Rash and Other (See Comments)    Has patient had a PCN reaction causing immediate rash, facial/tongue/throat swelling, SOB or lightheadedness with hypotension: No Has patient had a PCN reaction causing severe rash involving mucus membranes or skin necrosis: No Has patient had a PCN reaction that required hospitalization: Yes Has patient had a PCN reaction occurring within the last 10 years: No If all of the above answers are "NO", then may proceed with Cephalosporin use.   Marland Kitchen Ultram [Tramadol Hcl] Nausea And Vomiting    Medications: Current Outpatient Medications  Medication Sig Dispense Refill  . acetaminophen (TYLENOL) 500 MG tablet Take 1 tablet (500 mg total) by mouth every 6 (six) hours as  needed for moderate pain or headache. 30 tablet 0  . aspirin EC 81 MG tablet Take 81 mg by mouth every other day. AT NIGHT.    Marland Kitchen atenolol (TENORMIN) 50 MG tablet Take 50 mg by mouth at bedtime.    . cholecalciferol (VITAMIN D) 25 MCG (1000 UNIT) tablet Take 1,000 Units by mouth every evening.    . cyanocobalamin (,VITAMIN B-12,) 1000 MCG/ML injection Inject 1,000 mcg into the muscle every 30 (thirty) days.    . dapagliflozin propanediol (FARXIGA) 10 MG TABS tablet Take 10 mg by mouth daily.     . furosemide (LASIX) 20 MG tablet Take 10 mg by mouth every other day. IN THE MORNING    . gabapentin (NEURONTIN) 300 MG capsule Take 300 mg by mouth at bedtime.    Marland Kitchen glimepiride (AMARYL) 2 MG tablet Take 2 mg by mouth daily.    Marland Kitchen HYDROcodone-acetaminophen (NORCO/VICODIN) 5-325 MG tablet Take 1 tablet by mouth every 6 (six) hours as needed for moderate pain. 30 tablet 0  . lisinopril (PRINIVIL,ZESTRIL) 40 MG tablet Take 40 mg by mouth daily.    . metFORMIN (GLUCOPHAGE) 500 MG tablet Take 1,000 mg by mouth 2 (two) times daily with a meal.    . promethazine (PHENERGAN) 12.5 MG tablet Take 1 tablet (12.5 mg total) by mouth every 6 (six) hours as needed for nausea or vomiting. 30 tablet 5  . vitamin B-12 (CYANOCOBALAMIN) 1000 MCG tablet Take 1,000 mcg by mouth every evening.    Marland Kitchen ZINC OXIDE EX Apply 1 application topically 3 (three) times daily as needed (psoriasis flares).      No current facility-administered medications for this visit.   Facility-Administered Medications Ordered in Other Visits  Medication Dose Route Frequency Provider Last Rate Last Admin  . sodium chloride irrigation 0.9 %    PRN Carole Civil, MD   1,000 mL at 03/14/20 0945    Review of Systems: GENERAL: negative for malaise, night sweats HEENT: No changes in hearing or vision, no nose bleeds or other nasal problems. NECK: Negative for lumps, goiter, pain and significant neck swelling RESPIRATORY: Negative for cough,  wheezing CARDIOVASCULAR: Negative for chest pain, leg swelling, palpitations, orthopnea GI: SEE HPI MUSCULOSKELETAL: Negative for joint pain or swelling, back pain, and muscle pain. SKIN: Negative for lesions, rash PSYCH: Negative for sleep disturbance, mood disorder and recent psychosocial stressors. HEMATOLOGY Negative for prolonged bleeding, bruising easily, and swollen nodes. ENDOCRINE: Negative for cold or heat intolerance, polyuria, polydipsia and goiter. NEURO: negative for tremor, gait imbalance, syncope and seizures. The remainder of the review of  systems is noncontributory.   Physical Exam: BP 121/63 (BP Location: Left Arm, Patient Position: Sitting, Cuff Size: Large)   Pulse 76   Temp 97.9 F (36.6 C) (Oral)   Ht 5' 3"  (1.6 m)   Wt 135 lb (61.2 kg)   BMI 23.91 kg/m  GENERAL: The patient is AO x3, in no acute distress. Elder HEENT: Head is normocephalic and atraumatic. EOMI are intact. Mouth is well hydrated and without lesions. NECK: Supple. No masses LUNGS: Clear to auscultation. No presence of rhonchi/wheezing/rales. Adequate chest expansion HEART: RRR, normal s1 and s2. ABDOMEN: Soft, nontender, no guarding, no peritoneal signs, and nondistended. BS +. No masses. EXTREMITIES: Trace edema in her L leg but not in the R leg, Without any cyanosis, clubbing, rash, lesions. Has a leg knee brace. NEUROLOGIC: AOx3, no focal motor deficit. SKIN: no jaundice, no rashes  Imaging/Labs: as above  I personally reviewed and interpreted the available labs, imaging and endoscopic files.  Impression and Plan: Jeanette Chang is a 80 y.o. female with PMH NASH cirrhosis, diabetes, hyperlipidemia, who presents for follow up of cirrhosis.  He is a class A CPT cirrhotic without any episode of decompensation but evidence of previous grade 1 esophageal varices in most recent endoscopy.  She has not presented any decompensations and has a low MELD score.  She is due for repeat blood testing,  I will order MELD labs, AFP and a repeat ultrasound to be performed in April 2022.  Notably, she will need to have a repeat endoscopy in May 2022, during which we will attempt to visualize the ampulla better with the use of a transparent cap.  For now, she can continue taking the Imodium as needed for her episodes of diarrhea.  I advised her that she should discuss with her orthopedics doctor about her cirrhosis as she may need close monitoring after undergoing a major orthopedic surgery.  The labs that will be ordered today will help calculate a V[place] surgical score.  -Schedule liver US -Schedule EGD May 2022 - will use distal transparent cap -MELD labs and AFP -Reduce salt intake to <2 g per day -Can take Tylenol max of 2 g per day (650 mg q8h) for pain -Avoid NSAIDs for pain -Avoid eating raw oysters or shellfish -Can take Imodium as needed for diarrhea - RTC 6 months  All questions were answered.      Harvel Quale, MD Gastroenterology and Hepatology [facility]

## 2020-04-17 NOTE — Progress Notes (Signed)
Chief Complaint  Patient presents with  . Follow-up    Recheck on left knee, DOS 03-14-20.    80 year old female with thrombocytopenia and anemia status post 2 knee arthroscopies within the last year most recently in March 14, 2020  Arthroscopy shows bone-on-bone changes medial compartment  Patient continues to complain of severe pain in the left knee medial joint line  She is on hydrocodone she takes 1 at night.  She has a knee brace and a cane.  Her doctor Dr. Hewitt Shorts seems to think she is okay for surgery as long as we have platelets available  I need to see her A1c and her hemoglobin and talk to him and then we can schedule surgery.  Patient will need surgery on her left knee left total knee

## 2020-04-18 ENCOUNTER — Telehealth (INDEPENDENT_AMBULATORY_CARE_PROVIDER_SITE_OTHER): Payer: Self-pay | Admitting: Gastroenterology

## 2020-04-18 LAB — COMPREHENSIVE METABOLIC PANEL
AG Ratio: 1.5 (calc) (ref 1.0–2.5)
ALT: 23 U/L (ref 6–29)
AST: 31 U/L (ref 10–35)
Albumin: 4.1 g/dL (ref 3.6–5.1)
Alkaline phosphatase (APISO): 80 U/L (ref 37–153)
BUN/Creatinine Ratio: 25 (calc) — ABNORMAL HIGH (ref 6–22)
BUN: 31 mg/dL — ABNORMAL HIGH (ref 7–25)
CO2: 23 mmol/L (ref 20–32)
Calcium: 9.6 mg/dL (ref 8.6–10.4)
Chloride: 105 mmol/L (ref 98–110)
Creat: 1.22 mg/dL — ABNORMAL HIGH (ref 0.60–0.88)
Globulin: 2.8 g/dL (calc) (ref 1.9–3.7)
Glucose, Bld: 316 mg/dL — ABNORMAL HIGH (ref 65–99)
Potassium: 4.7 mmol/L (ref 3.5–5.3)
Sodium: 139 mmol/L (ref 135–146)
Total Bilirubin: 0.4 mg/dL (ref 0.2–1.2)
Total Protein: 6.9 g/dL (ref 6.1–8.1)

## 2020-04-18 LAB — CBC WITH DIFFERENTIAL/PLATELET
Absolute Monocytes: 150 cells/uL — ABNORMAL LOW (ref 200–950)
Basophils Absolute: 21 cells/uL (ref 0–200)
Basophils Relative: 0.7 %
Eosinophils Absolute: 120 cells/uL (ref 15–500)
Eosinophils Relative: 4 %
HCT: 33.2 % — ABNORMAL LOW (ref 35.0–45.0)
Hemoglobin: 11.2 g/dL — ABNORMAL LOW (ref 11.7–15.5)
Lymphs Abs: 1068 cells/uL (ref 850–3900)
MCH: 32.2 pg (ref 27.0–33.0)
MCHC: 33.7 g/dL (ref 32.0–36.0)
MCV: 95.4 fL (ref 80.0–100.0)
MPV: 12.2 fL (ref 7.5–12.5)
Monocytes Relative: 5 %
Neutro Abs: 1641 cells/uL (ref 1500–7800)
Neutrophils Relative %: 54.7 %
Platelets: 54 10*3/uL — ABNORMAL LOW (ref 140–400)
RBC: 3.48 10*6/uL — ABNORMAL LOW (ref 3.80–5.10)
RDW: 12.9 % (ref 11.0–15.0)
Total Lymphocyte: 35.6 %
WBC: 3 10*3/uL — ABNORMAL LOW (ref 3.8–10.8)

## 2020-04-18 LAB — AFP TUMOR MARKER: AFP-Tumor Marker: 2.1 ng/mL

## 2020-04-18 LAB — PROTIME-INR
INR: 1.1
Prothrombin Time: 10.9 s (ref 9.0–11.5)

## 2020-04-18 NOTE — Telephone Encounter (Signed)
Predicted Postoperative Outcomes by the VOCAL-Penn Score:     30-day mortality: 3.3%     90-day mortality: 6.7%     180-day mortality: 9.3%     90-day decompensation: 5.1%  This will need to be taken into account for her orthopedic surgery.  Thanks,  [name], MD Gastroenterology and Hepatology [facility]

## 2020-04-21 NOTE — Telephone Encounter (Signed)
Ok thank you 

## 2020-04-21 NOTE — Telephone Encounter (Signed)
You are welcome ! I will recommend close monitoring after procedure for at least 2 to 3 days at our hospital after performing the procedure, the gastroenterology service can be consulted at that time for further monitoring - will need to check daily CBC, CMP, INR.  I discussed with the patient previously that there is an inherent increased risk of mortality given her liver disease.  There is no medication that can potentially improve her outcomes but given that she has not presented any decompensating event, her overall risk is lower than other cirrhotics

## 2020-04-21 NOTE — Telephone Encounter (Signed)
Thank you   What should we do differently ?

## 2020-04-25 ENCOUNTER — Ambulatory Visit (HOSPITAL_COMMUNITY)
Admission: RE | Admit: 2020-04-25 | Discharge: 2020-04-25 | Disposition: A | Discharge status: Home / Self Care | Payer: Medicare Other | Source: Ambulatory Visit | Attending: Gastroenterology | Admitting: Gastroenterology

## 2020-04-25 DIAGNOSIS — K7460 Unspecified cirrhosis of liver: Secondary | ICD-10-CM | POA: Diagnosis not present

## 2020-04-25 DIAGNOSIS — K7581 Nonalcoholic steatohepatitis (NASH): Secondary | ICD-10-CM | POA: Diagnosis not present

## 2020-04-25 IMAGING — US US ABDOMEN LIMITED RUQ/ASCITES
1 series · 14 of 19 positions shown · non-contrast
Comparison: None.

CLINICAL DATA: Cirrhosis

EXAM:
ULTRASOUND ABDOMEN LIMITED RIGHT UPPER QUADRANT

[Series 1: us abdomen limited ruq (liver/gb) · 14 of 19 slices shown]
[im 1/19]
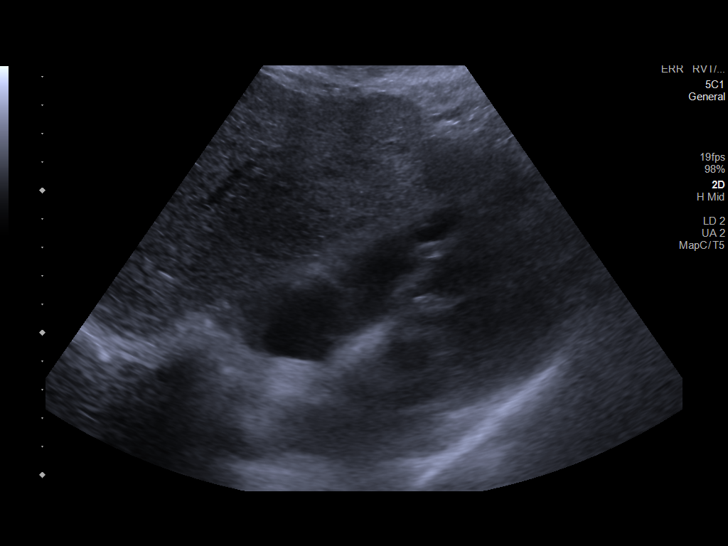
[im 3/19]
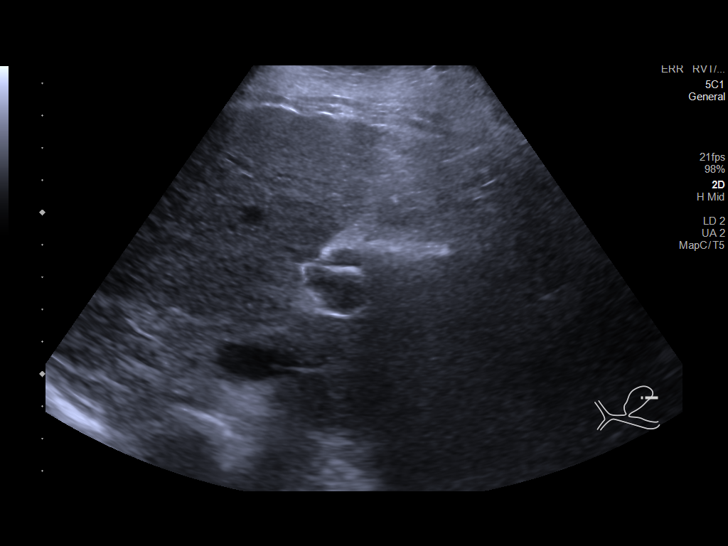
[im 4/19]
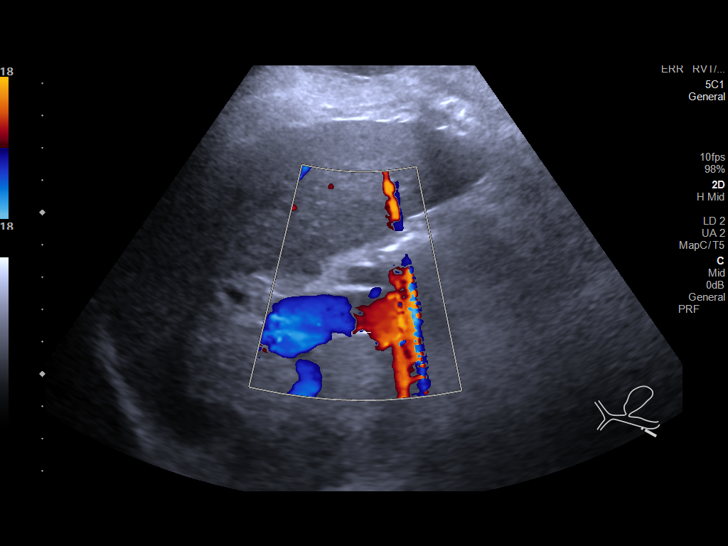
[im 5/19]
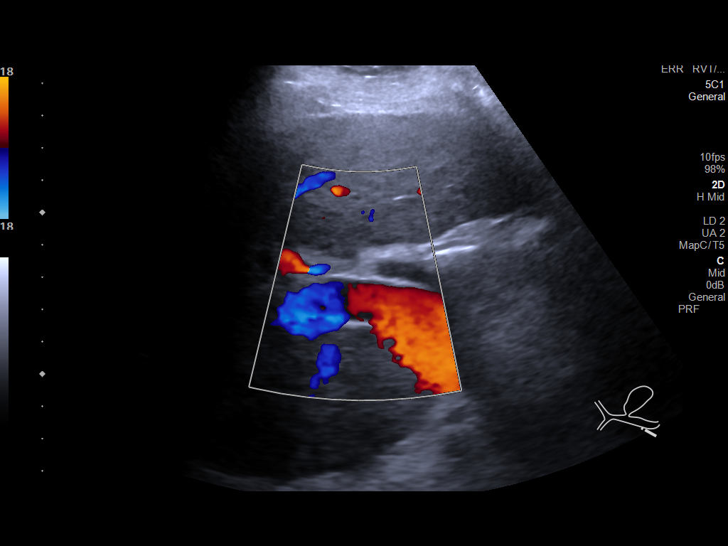
[im 7/19]
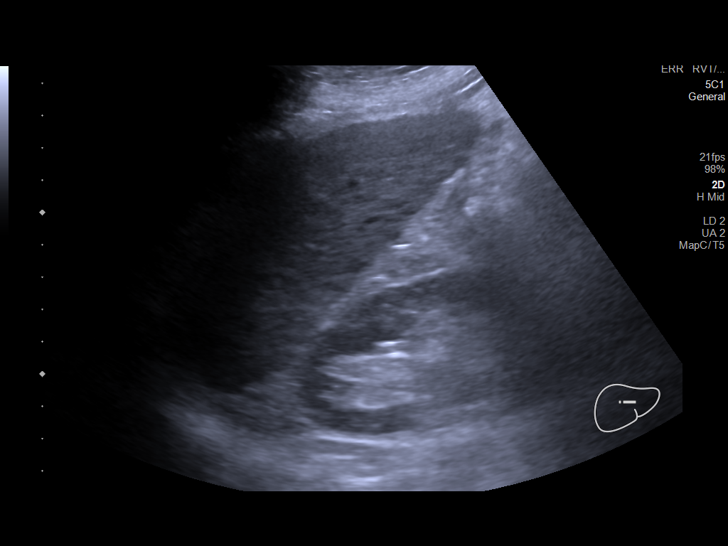
[im 8/19]
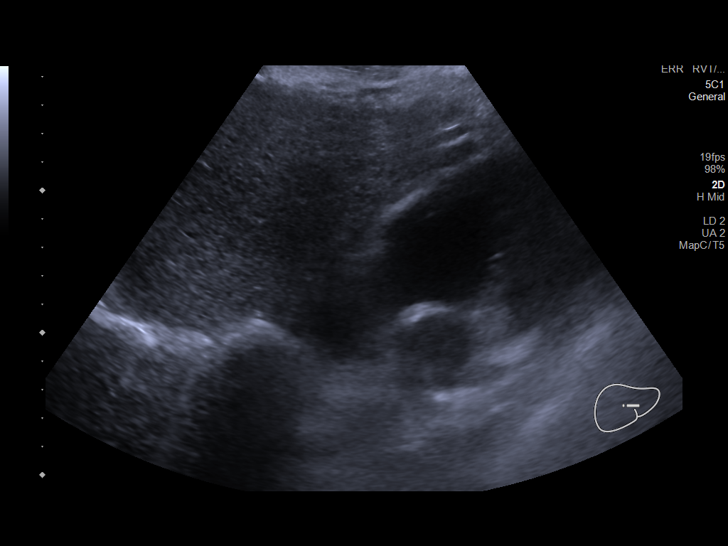
[im 9/19]
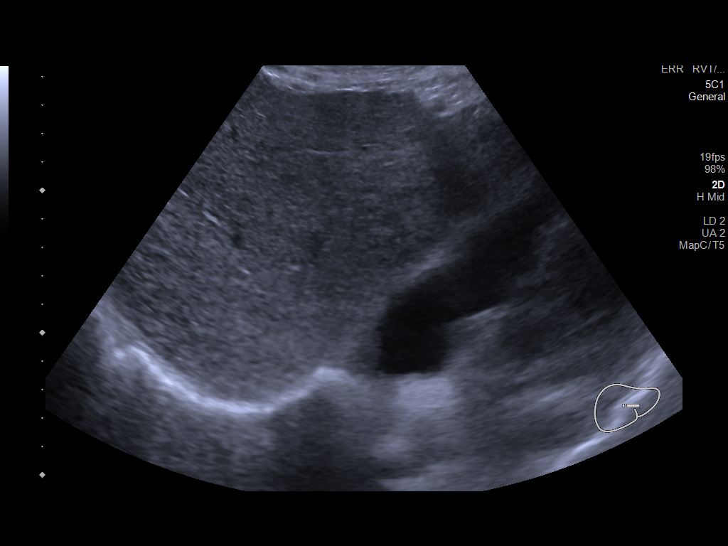
[im 11/19]
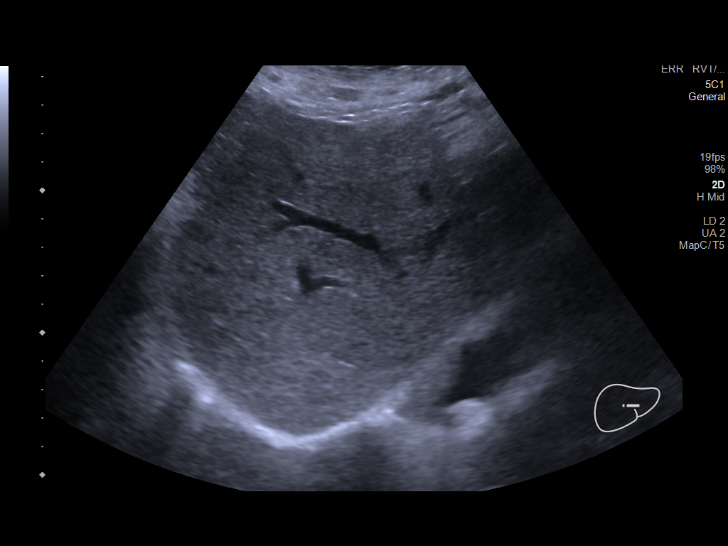
[im 12/19]
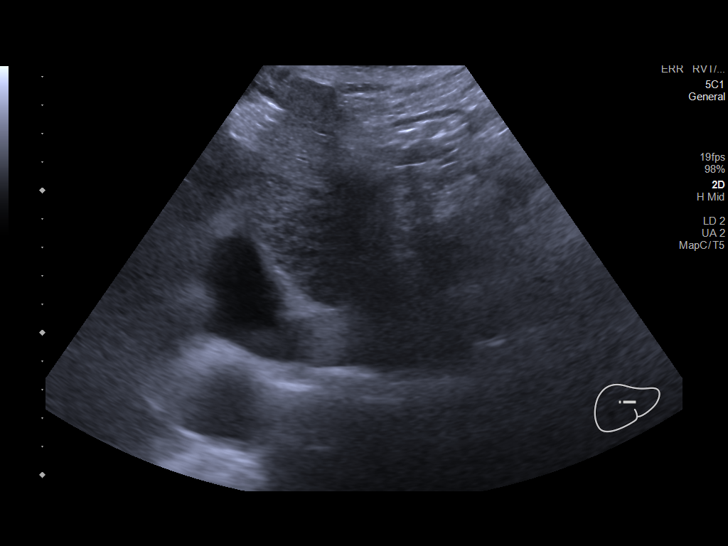
[im 13/19]
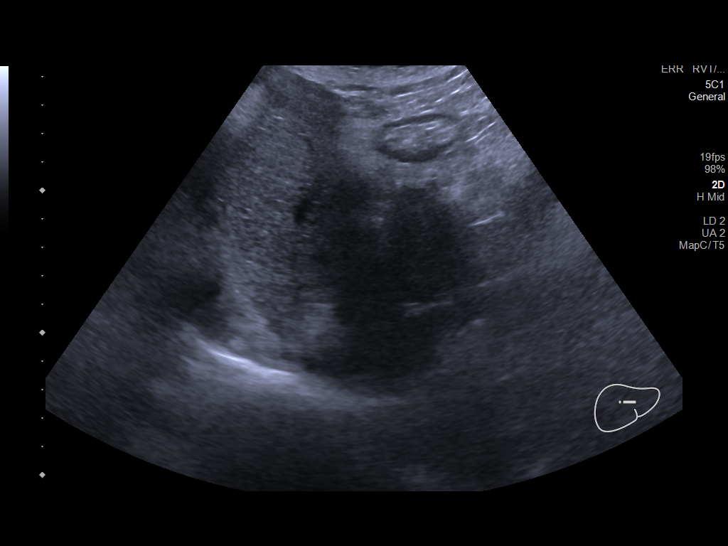
[im 15/19]
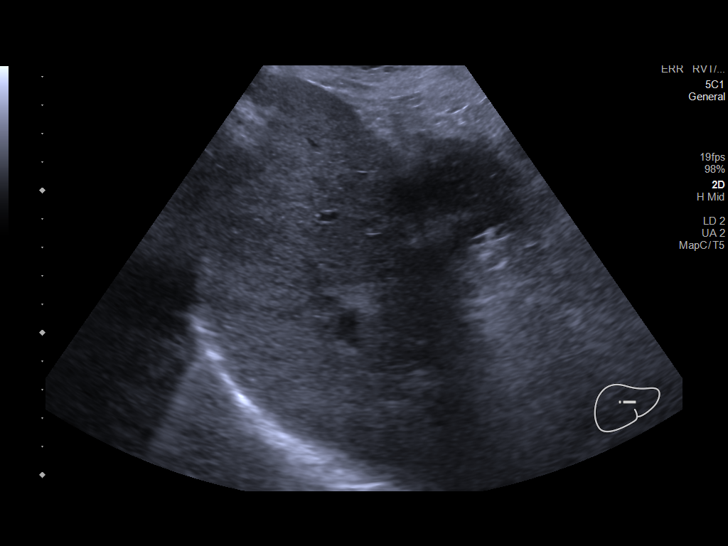
[im 16/19]
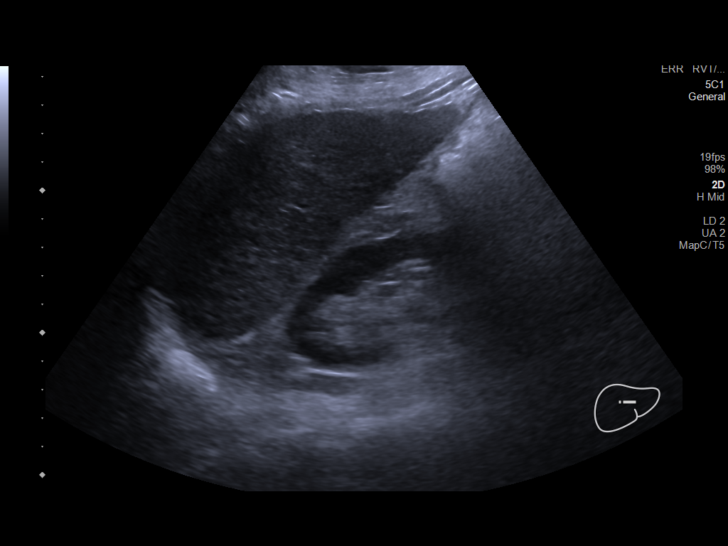
[im 17/19]
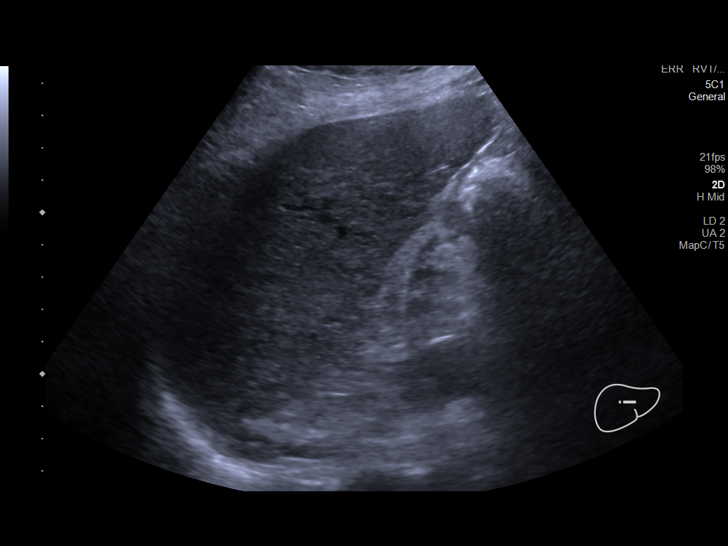
[im 19/19]
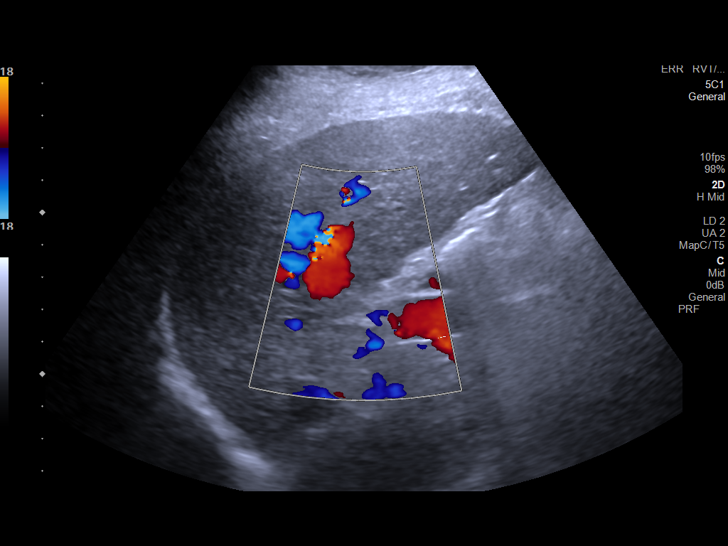

[14 of 19 positions shown; findings below may reference images not displayed]

FINDINGS: Gallbladder:

Surgically absent

Common bile duct:

Diameter: 6.2 mm

Liver:

Increased echogenicity. The liver demonstrates a nodular contour. No
focal masses. Portal vein is patent on color Doppler imaging with
normal direction of blood flow towards the liver.

Other: None.
IMPRESSION: 1. Cirrhosis.
2. Cholecystectomy.
3. No other abnormalities.

## 2020-04-27 NOTE — Progress Notes (Signed)
Hi Crystal,  Can you please call the patient and tell the patient the liver US did not show any liver masses? Needs to repea tin 6 months  Thanks,  [name], MD Gastroenterology and Hepatology [facility]

## 2020-04-28 ENCOUNTER — Telehealth (INDEPENDENT_AMBULATORY_CARE_PROVIDER_SITE_OTHER): Payer: Self-pay | Admitting: *Deleted

## 2020-04-28 NOTE — Telephone Encounter (Signed)
Address the [place] abd with the patient's daughter Jeanette Chang.

## 2020-04-28 NOTE — Telephone Encounter (Signed)
Returning someone's call, please call [phone]

## 2020-04-29 ENCOUNTER — Telehealth: Payer: Self-pay | Admitting: Orthopedic Surgery

## 2020-04-29 ENCOUNTER — Other Ambulatory Visit: Payer: Self-pay | Admitting: Orthopedic Surgery

## 2020-04-29 NOTE — Telephone Encounter (Signed)
We sent letter to Dr Federico Flake have not heard anything back from him. I let her know, she states she has appointment there on Thursday and will let him know we need to hear back from him.

## 2020-04-29 NOTE — Telephone Encounter (Signed)
Patient called to inquire about scheduling her surgery for left total knee replacement; states has not heard as of yet. States Dr Aline Brochure may be checking with her other specialist regarding platelets?  Please advise.

## 2020-04-29 NOTE — Telephone Encounter (Signed)
Patient called for refill - states has none left of medication: HYDROcodone-acetaminophen (NORCO/VICODIN) 5-325 MG tablet 30 tablet  [place]

## 2020-04-29 NOTE — Telephone Encounter (Signed)
Per call from [facility] at [place], Dr Wanita Chamberlain requests to speak with Dr Aline Brochure in regard to this patient. Aware Dr Aline Brochure is out of clinic today and in surgery. States tomorrow is fine. PH# [phone]

## 2020-04-30 ENCOUNTER — Telehealth: Payer: Self-pay | Admitting: Orthopedic Surgery

## 2020-04-30 MED ORDER — HYDROCODONE-ACETAMINOPHEN 5-325 MG PO TABS: 1.0000 | ORAL_TABLET | Freq: Four times a day (QID) | ORAL | 0 refills | Status: DC | PRN

## 2020-04-30 NOTE — Telephone Encounter (Signed)
Call dr Candie Echevaria   hes going to call me back on my cell

## 2020-05-01 DIAGNOSIS — K7469 Other cirrhosis of liver: Secondary | ICD-10-CM | POA: Diagnosis not present

## 2020-05-01 DIAGNOSIS — D508 Other iron deficiency anemias: Secondary | ICD-10-CM | POA: Diagnosis not present

## 2020-05-01 DIAGNOSIS — D696 Thrombocytopenia, unspecified: Secondary | ICD-10-CM | POA: Diagnosis not present

## 2020-05-01 DIAGNOSIS — D702 Other drug-induced agranulocytosis: Secondary | ICD-10-CM | POA: Diagnosis not present

## 2020-05-07 DIAGNOSIS — D696 Thrombocytopenia, unspecified: Secondary | ICD-10-CM | POA: Diagnosis not present

## 2020-05-07 DIAGNOSIS — Z6825 Body mass index (BMI) 25.0-25.9, adult: Secondary | ICD-10-CM | POA: Diagnosis not present

## 2020-05-07 DIAGNOSIS — M23322 Other meniscus derangements, posterior horn of medial meniscus, left knee: Secondary | ICD-10-CM | POA: Diagnosis not present

## 2020-05-07 DIAGNOSIS — S83232D Complex tear of medial meniscus, current injury, left knee, subsequent encounter: Secondary | ICD-10-CM | POA: Diagnosis not present

## 2020-05-14 DIAGNOSIS — S83232D Complex tear of medial meniscus, current injury, left knee, subsequent encounter: Secondary | ICD-10-CM | POA: Diagnosis not present

## 2020-05-14 DIAGNOSIS — M25562 Pain in left knee: Secondary | ICD-10-CM | POA: Diagnosis not present

## 2020-05-14 DIAGNOSIS — M23322 Other meniscus derangements, posterior horn of medial meniscus, left knee: Secondary | ICD-10-CM | POA: Diagnosis not present

## 2020-05-19 DIAGNOSIS — Z6825 Body mass index (BMI) 25.0-25.9, adult: Secondary | ICD-10-CM | POA: Diagnosis not present

## 2020-05-19 DIAGNOSIS — D508 Other iron deficiency anemias: Secondary | ICD-10-CM | POA: Diagnosis not present

## 2020-05-19 DIAGNOSIS — E538 Deficiency of other specified B group vitamins: Secondary | ICD-10-CM | POA: Diagnosis not present

## 2020-05-19 DIAGNOSIS — D732 Chronic congestive splenomegaly: Secondary | ICD-10-CM | POA: Diagnosis not present

## 2020-05-19 DIAGNOSIS — S83232D Complex tear of medial meniscus, current injury, left knee, subsequent encounter: Secondary | ICD-10-CM | POA: Diagnosis not present

## 2020-05-19 DIAGNOSIS — D696 Thrombocytopenia, unspecified: Secondary | ICD-10-CM | POA: Diagnosis not present

## 2020-05-19 DIAGNOSIS — K769 Liver disease, unspecified: Secondary | ICD-10-CM | POA: Diagnosis not present

## 2020-05-22 DIAGNOSIS — D509 Iron deficiency anemia, unspecified: Secondary | ICD-10-CM | POA: Diagnosis not present

## 2020-05-26 ENCOUNTER — Other Ambulatory Visit (INDEPENDENT_AMBULATORY_CARE_PROVIDER_SITE_OTHER): Payer: Self-pay

## 2020-05-26 ENCOUNTER — Encounter (INDEPENDENT_AMBULATORY_CARE_PROVIDER_SITE_OTHER): Payer: Self-pay

## 2020-05-26 DIAGNOSIS — K7469 Other cirrhosis of liver: Secondary | ICD-10-CM

## 2020-05-26 DIAGNOSIS — I864 Gastric varices: Secondary | ICD-10-CM

## 2020-05-26 DIAGNOSIS — K7460 Unspecified cirrhosis of liver: Secondary | ICD-10-CM

## 2020-05-28 DIAGNOSIS — D509 Iron deficiency anemia, unspecified: Secondary | ICD-10-CM | POA: Diagnosis not present

## 2020-06-11 ENCOUNTER — Encounter (INDEPENDENT_AMBULATORY_CARE_PROVIDER_SITE_OTHER): Payer: Self-pay | Admitting: *Deleted

## 2020-06-14 DIAGNOSIS — E1165 Type 2 diabetes mellitus with hyperglycemia: Secondary | ICD-10-CM | POA: Diagnosis not present

## 2020-06-14 DIAGNOSIS — I10 Essential (primary) hypertension: Secondary | ICD-10-CM | POA: Diagnosis not present

## 2020-06-14 DIAGNOSIS — M1712 Unilateral primary osteoarthritis, left knee: Secondary | ICD-10-CM | POA: Diagnosis not present

## 2020-06-16 ENCOUNTER — Other Ambulatory Visit (HOSPITAL_COMMUNITY)
Admission: RE | Admit: 2020-06-16 | Discharge: 2020-06-16 | Disposition: A | Discharge status: Home / Self Care | Payer: Medicare Other | Source: Ambulatory Visit | Attending: Gastroenterology | Admitting: Gastroenterology

## 2020-06-16 ENCOUNTER — Other Ambulatory Visit: Payer: Self-pay

## 2020-06-16 DIAGNOSIS — E785 Hyperlipidemia, unspecified: Secondary | ICD-10-CM | POA: Diagnosis not present

## 2020-06-16 DIAGNOSIS — Z01812 Encounter for preprocedural laboratory examination: Secondary | ICD-10-CM | POA: Diagnosis not present

## 2020-06-16 DIAGNOSIS — I8510 Secondary esophageal varices without bleeding: Secondary | ICD-10-CM | POA: Diagnosis not present

## 2020-06-16 DIAGNOSIS — Z880 Allergy status to penicillin: Secondary | ICD-10-CM | POA: Diagnosis not present

## 2020-06-16 DIAGNOSIS — Z20822 Contact with and (suspected) exposure to covid-19: Secondary | ICD-10-CM | POA: Insufficient documentation

## 2020-06-16 DIAGNOSIS — Z7982 Long term (current) use of aspirin: Secondary | ICD-10-CM | POA: Diagnosis not present

## 2020-06-16 DIAGNOSIS — Z888 Allergy status to other drugs, medicaments and biological substances status: Secondary | ICD-10-CM | POA: Diagnosis not present

## 2020-06-16 DIAGNOSIS — K3189 Other diseases of stomach and duodenum: Secondary | ICD-10-CM | POA: Diagnosis not present

## 2020-06-16 DIAGNOSIS — K766 Portal hypertension: Secondary | ICD-10-CM | POA: Diagnosis not present

## 2020-06-16 DIAGNOSIS — Z91018 Allergy to other foods: Secondary | ICD-10-CM | POA: Diagnosis not present

## 2020-06-16 DIAGNOSIS — E119 Type 2 diabetes mellitus without complications: Secondary | ICD-10-CM | POA: Diagnosis not present

## 2020-06-16 DIAGNOSIS — Z79899 Other long term (current) drug therapy: Secondary | ICD-10-CM | POA: Diagnosis not present

## 2020-06-16 DIAGNOSIS — K7581 Nonalcoholic steatohepatitis (NASH): Secondary | ICD-10-CM | POA: Diagnosis not present

## 2020-06-16 DIAGNOSIS — K317 Polyp of stomach and duodenum: Secondary | ICD-10-CM | POA: Diagnosis not present

## 2020-06-16 DIAGNOSIS — Z809 Family history of malignant neoplasm, unspecified: Secondary | ICD-10-CM | POA: Diagnosis not present

## 2020-06-16 DIAGNOSIS — Z8249 Family history of ischemic heart disease and other diseases of the circulatory system: Secondary | ICD-10-CM | POA: Diagnosis not present

## 2020-06-16 DIAGNOSIS — Z7984 Long term (current) use of oral hypoglycemic drugs: Secondary | ICD-10-CM | POA: Diagnosis not present

## 2020-06-16 DIAGNOSIS — K7460 Unspecified cirrhosis of liver: Secondary | ICD-10-CM | POA: Diagnosis not present

## 2020-06-16 DIAGNOSIS — Z885 Allergy status to narcotic agent status: Secondary | ICD-10-CM | POA: Diagnosis not present

## 2020-06-16 DIAGNOSIS — Z87891 Personal history of nicotine dependence: Secondary | ICD-10-CM | POA: Diagnosis not present

## 2020-06-16 LAB — BASIC METABOLIC PANEL
Anion gap: 9 (ref 5–15)
BUN: 25 mg/dL — ABNORMAL HIGH (ref 8–23)
CO2: 25 mmol/L (ref 22–32)
Calcium: 9.4 mg/dL (ref 8.9–10.3)
Chloride: 107 mmol/L (ref 98–111)
Creatinine, Ser: 1.14 mg/dL — ABNORMAL HIGH (ref 0.44–1.00)
GFR, Estimated: 48 mL/min — ABNORMAL LOW (ref >60)
Glucose, Bld: 110 mg/dL — ABNORMAL HIGH (ref 70–99)
Potassium: 4.1 mmol/L (ref 3.5–5.1)
Sodium: 141 mmol/L (ref 135–145)

## 2020-06-17 ENCOUNTER — Ambulatory Visit (HOSPITAL_COMMUNITY): Payer: Medicare Other | Admitting: Anesthesiology

## 2020-06-17 ENCOUNTER — Encounter (HOSPITAL_COMMUNITY)
Admission: RE | Disposition: A | Discharge status: Home / Self Care | Payer: Self-pay | Source: Home / Self Care | Attending: Gastroenterology

## 2020-06-17 ENCOUNTER — Ambulatory Visit (HOSPITAL_COMMUNITY)
Admission: RE | Admit: 2020-06-17 | Discharge: 2020-06-17 | Disposition: A | Discharge status: Home / Self Care | Payer: Medicare Other | Attending: Gastroenterology | Admitting: Gastroenterology

## 2020-06-17 ENCOUNTER — Other Ambulatory Visit: Payer: Self-pay

## 2020-06-17 ENCOUNTER — Encounter (HOSPITAL_COMMUNITY): Payer: Self-pay | Admitting: Gastroenterology

## 2020-06-17 DIAGNOSIS — D490 Neoplasm of unspecified behavior of digestive system: Secondary | ICD-10-CM | POA: Diagnosis not present

## 2020-06-17 DIAGNOSIS — K7460 Unspecified cirrhosis of liver: Secondary | ICD-10-CM | POA: Insufficient documentation

## 2020-06-17 DIAGNOSIS — E785 Hyperlipidemia, unspecified: Secondary | ICD-10-CM | POA: Insufficient documentation

## 2020-06-17 DIAGNOSIS — K317 Polyp of stomach and duodenum: Secondary | ICD-10-CM | POA: Diagnosis not present

## 2020-06-17 DIAGNOSIS — Z809 Family history of malignant neoplasm, unspecified: Secondary | ICD-10-CM | POA: Insufficient documentation

## 2020-06-17 DIAGNOSIS — I8510 Secondary esophageal varices without bleeding: Secondary | ICD-10-CM | POA: Insufficient documentation

## 2020-06-17 DIAGNOSIS — K7581 Nonalcoholic steatohepatitis (NASH): Secondary | ICD-10-CM | POA: Insufficient documentation

## 2020-06-17 DIAGNOSIS — E119 Type 2 diabetes mellitus without complications: Secondary | ICD-10-CM | POA: Insufficient documentation

## 2020-06-17 DIAGNOSIS — Z91018 Allergy to other foods: Secondary | ICD-10-CM | POA: Insufficient documentation

## 2020-06-17 DIAGNOSIS — Z880 Allergy status to penicillin: Secondary | ICD-10-CM | POA: Insufficient documentation

## 2020-06-17 DIAGNOSIS — Z7984 Long term (current) use of oral hypoglycemic drugs: Secondary | ICD-10-CM | POA: Insufficient documentation

## 2020-06-17 DIAGNOSIS — K3189 Other diseases of stomach and duodenum: Secondary | ICD-10-CM | POA: Insufficient documentation

## 2020-06-17 DIAGNOSIS — K766 Portal hypertension: Secondary | ICD-10-CM | POA: Diagnosis not present

## 2020-06-17 DIAGNOSIS — Z8249 Family history of ischemic heart disease and other diseases of the circulatory system: Secondary | ICD-10-CM | POA: Insufficient documentation

## 2020-06-17 DIAGNOSIS — D696 Thrombocytopenia, unspecified: Secondary | ICD-10-CM | POA: Diagnosis not present

## 2020-06-17 DIAGNOSIS — Z7982 Long term (current) use of aspirin: Secondary | ICD-10-CM | POA: Diagnosis not present

## 2020-06-17 DIAGNOSIS — Z79899 Other long term (current) drug therapy: Secondary | ICD-10-CM | POA: Insufficient documentation

## 2020-06-17 DIAGNOSIS — Z888 Allergy status to other drugs, medicaments and biological substances status: Secondary | ICD-10-CM | POA: Insufficient documentation

## 2020-06-17 DIAGNOSIS — K2970 Gastritis, unspecified, without bleeding: Secondary | ICD-10-CM | POA: Diagnosis not present

## 2020-06-17 DIAGNOSIS — I8500 Esophageal varices without bleeding: Secondary | ICD-10-CM

## 2020-06-17 DIAGNOSIS — Z885 Allergy status to narcotic agent status: Secondary | ICD-10-CM | POA: Insufficient documentation

## 2020-06-17 DIAGNOSIS — Z87891 Personal history of nicotine dependence: Secondary | ICD-10-CM | POA: Insufficient documentation

## 2020-06-17 DIAGNOSIS — I864 Gastric varices: Secondary | ICD-10-CM

## 2020-06-17 HISTORY — PX: BIOPSY: SHX5522

## 2020-06-17 HISTORY — PX: ESOPHAGOGASTRODUODENOSCOPY (EGD) WITH PROPOFOL: SHX5813

## 2020-06-17 LAB — GLUCOSE, CAPILLARY: Glucose-Capillary: 91 mg/dL (ref 70–99)

## 2020-06-17 LAB — SARS CORONAVIRUS 2 (TAT 6-24 HRS): SARS Coronavirus 2: NEGATIVE

## 2020-06-17 SURGERY — ESOPHAGOGASTRODUODENOSCOPY (EGD) WITH PROPOFOL
Anesthesia: General

## 2020-06-17 MED ORDER — PHENYLEPHRINE HCL (PRESSORS) 10 MG/ML IV SOLN
INTRAVENOUS | Status: DC | PRN
  Administered 2020-06-17: 40 ug via INTRAVENOUS
  Administered 2020-06-17: 120 ug via INTRAVENOUS

## 2020-06-17 MED ORDER — CHLORHEXIDINE GLUCONATE CLOTH 2 % EX PADS: 6.0000 | MEDICATED_PAD | Freq: Once | CUTANEOUS | Status: DC

## 2020-06-17 MED ORDER — PROPOFOL 10 MG/ML IV BOLUS
INTRAVENOUS | Status: DC | PRN
  Administered 2020-06-17 (×3): 50 mg via INTRAVENOUS
  Administered 2020-06-17: 100 mg via INTRAVENOUS

## 2020-06-17 MED ORDER — PHENYLEPHRINE 40 MCG/ML (10ML) SYRINGE FOR IV PUSH (FOR BLOOD PRESSURE SUPPORT)
PREFILLED_SYRINGE | INTRAVENOUS | Status: AC
  Filled 2020-06-17: qty 10

## 2020-06-17 MED ORDER — LIDOCAINE HCL (CARDIAC) PF 50 MG/5ML IV SOSY
PREFILLED_SYRINGE | INTRAVENOUS | Status: DC | PRN
  Administered 2020-06-17: 50 mg via INTRAVENOUS

## 2020-06-17 MED ORDER — STERILE WATER FOR IRRIGATION IR SOLN
Status: DC | PRN
  Administered 2020-06-17: 1.5 mL

## 2020-06-17 MED ORDER — LACTATED RINGERS IV SOLN
INTRAVENOUS | Status: DC
  Administered 2020-06-17: 1000 mL via INTRAVENOUS

## 2020-06-17 MED ORDER — CARVEDILOL 3.125 MG PO TABS: 3.1250 mg | ORAL_TABLET | Freq: Two times a day (BID) | ORAL | 1 refills | Status: DC

## 2020-06-17 NOTE — Anesthesia Postprocedure Evaluation (Signed)
Anesthesia Post Note  Patient: Nataya Bastedo  Procedure(s) Performed: ESOPHAGOGASTRODUODENOSCOPY (EGD) WITH PROPOFOL (N/A ) BIOPSY  Patient location during evaluation: Endoscopy Anesthesia Type: General Level of consciousness: awake and alert and oriented Pain management: pain level controlled Vital Signs Assessment: post-procedure vital signs reviewed and stable Respiratory status: spontaneous breathing and respiratory function stable Cardiovascular status: blood pressure returned to baseline and stable Postop Assessment: no apparent nausea or vomiting Anesthetic complications: no   No complications documented.   Last Vitals:  Vitals:   06/17/20 0913 06/17/20 1041  BP: (!) 164/63 (!) 126/55  Pulse: 73 71  Resp: 13 20  Temp: 36.6 C   SpO2: 100% 100%    Last Pain:  Vitals:   06/17/20 1041  TempSrc:   PainSc: 0-No pain                 Carlisle Enke C Ishmeal Rorie

## 2020-06-17 NOTE — Transfer of Care (Signed)
Immediate Anesthesia Transfer of Care Note  Patient: Jeanette Chang  Procedure(s) Performed: ESOPHAGOGASTRODUODENOSCOPY (EGD) WITH PROPOFOL (N/A ) BIOPSY  Patient Location: Endoscopy Unit  Anesthesia Type:General  Level of Consciousness: awake and alert   Airway & Oxygen Therapy: Patient Spontanous Breathing  Post-op Assessment: Report given to RN and Post -op Vital signs reviewed and stable  Post vital signs: Reviewed and stable  Last Vitals:  Vitals Value Taken Time  BP    Temp    Pulse 71 06/17/20 1041  Resp 20 06/17/20 1041  SpO2 100 % 06/17/20 1041   SEE VITAL SIGN FLOW SHEET Last Pain:  Vitals:   06/17/20 1041  TempSrc:   PainSc: 0-No pain      Patients Stated Pain Goal: 9 (29/04/75 3391)  Complications: No complications documented.

## 2020-06-17 NOTE — H&P (Signed)
Jeanette Chang is an 80 y.o. female.   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  Patient denies having any complaints, states feeling well, denies any nausea, vomiting, fever, chills, hematemesis, melena or hematochezia.  Currently on atenolol for blood pressure control.  Patient had a capsule endoscopy on 10/24/2019, was found to have Focal duodenitis with erosion just past the pylorus without stigmata of bleed.  Focal area with mucosal nodularity felt to be expanded on a large ampulla of Vater seen on image at 27 minutes and 27 seconds.  Last EGD: 06/29/2019 - grade I EV, portal hypertensive gastropathy  Past Medical History:  Diagnosis Date  . Abnormal LFTs   . Achilles bursitis or tendinitis   . Acute maxillary sinusitis   . Acute pharyngitis   . Anemia, unspecified   . Candidiasis of skin and nails   . Dermatophytosis of foot   . Dermatophytosis of the body   . Diverticulitis of colon (without mention of hemorrhage)(562.11)   . Edema   . Headache(784.0)   . HOH (hard of hearing)   . Malaise and fatigue   . 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 Osteoarthrosis, unspecified whether generalized or localized, unspecified site   . Other dyspnea and respiratory abnormality   . Other psoriasis   . Other specified disorders of rotator cuff syndrome of shoulder and allied disorders   . Pain in joint   . Pure hypercholesterolemia   . RUQ pain   . Spasm of back muscles   . Thrombocytopenia, unspecified ([place])   . Type II or unspecified type diabetes mellitus without mention of complication, not stated as uncontrolled   . Unspecified essential hypertension   . Unspecified sinusitis (chronic)   . Urinary incontinence     Past  Surgical History:  Procedure Laterality Date  . APPENDECTOMY  1964  . BIOPSY  03/02/2017   Procedure: BIOPSY;  Surgeon: Rogene Houston, MD;  [facility];  Service: Endoscopy;;  antral  . CAROTID ENDARTERECTOMY  2005   Left CEA  . CAROTID ENDARTERECTOMY  2009   Right CEA  . CATARACT EXTRACTION, BILATERAL Bilateral   . CHOLECYSTECTOMY  1982   Gall Bladder  . COLONOSCOPY  09  . COLONOSCOPY N/A 06/27/2019   Procedure: COLONOSCOPY;  Surgeon: Rogene Houston, MD;  [facility];  Service: Endoscopy;  Laterality: N/A;  . ESOPHAGOGASTRODUODENOSCOPY N/A 03/02/2017   Procedure: ESOPHAGOGASTRODUODENOSCOPY (EGD);  Surgeon: Rogene Houston, MD;  [facility];  Service: Endoscopy;  Laterality: N/A;  12:55  . ESOPHAGOGASTRODUODENOSCOPY N/A 06/27/2019   Procedure: ESOPHAGOGASTRODUODENOSCOPY (EGD);  Surgeon: Rogene Houston, MD;  [facility];  Service: Endoscopy;  Laterality: N/A;  730  . GIVENS CAPSULE STUDY N/A 10/24/2019   Procedure: GIVENS CAPSULE STUDY;  Surgeon: Rogene Houston, MD;  [facility];  Service: Endoscopy;  Laterality: N/A;  730  . KNEE ARTHROSCOPY WITH MEDIAL MENISECTOMY Left 07/10/2019   Procedure: KNEE ARTHROSCOPY WITH MEDIAL MENISCECTOMY;  Surgeon: Carole Civil, MD;  Location: [facility];  Service: Orthopedics;  Laterality: Left;  . KNEE ARTHROSCOPY WITH MEDIAL MENISECTOMY Left 03/14/2020   Procedure: KNEE ARTHROSCOPY WITH MEDIAL MENISCECTOMY;  Surgeon: Carole Civil, MD;  Location: [facility];  Service: Orthopedics;  Laterality:  Left;  . NOSE SURGERY  1978  . PARATHYROIDECTOMY  1992  . POLYPECTOMY  03/02/2017   Procedure: POLYPECTOMY;  Surgeon: Rogene Houston, MD;  [facility];  Service: Endoscopy;;  gastric   . POLYPECTOMY  06/27/2019   Procedure: POLYPECTOMY;  Surgeon: Rogene Houston, MD;  [facility];  Service: Endoscopy;;  colon  . Power port  03/06/2009   Right upper chest   .  TONSILLECTOMY    . TUMOR EXCISION Right August 24, 2013   [facility] Dr. Ellen Henri, ENT  . VAGINAL HYSTERECTOMY  1972    Family History  Problem Relation Age of Onset  . Heart attack Mother   . Cancer Mother   . Heart attack Father   . Cancer Sister    Social History:  reports that she quit smoking about 28 years ago. Her smoking use included cigarettes. She has a 42.00 pack-year smoking history. She quit smokeless tobacco use about 27 years ago. She reports current alcohol use of about 1.0 standard drink of alcohol per week. She reports that she does not use drugs.  Allergies:  Allergies  Allergen Reactions  . Codeine Nausea And Vomiting and Rash  . Monascus Purpureus Went Yeast Other (See Comments)    Headaches, myalgias  . Niacin And Related Other (See Comments)    Headaches, myalgias  . Red Yeast Rice Other (See Comments)    Headaches, myalgias  . Actos [Pioglitazone Hydrochloride] Other (See Comments)    Severe headache  . Amaryl Nausea Only and Other (See Comments)    Severe headache (patient is tolerating 2 mg dosing)  . Iron Diarrhea  . [place] [Trospium Chloride] Nausea Only and Other (See Comments)    Severe headache  . Statins Other (See Comments)    Severe headache,  Hips and leg weakness that cause patient not to be able to function as per patient.  Lisbeth Ply [Fesoterodine Fumarate] Other (See Comments)    Cramps, severe headache  . Norco [Hydrocodone-Acetaminophen] Nausea And Vomiting and Anxiety  . Penicillins Swelling, Rash and Other (See Comments)    Has patient had a PCN reaction causing immediate rash, facial/tongue/throat swelling, SOB or lightheadedness with hypotension: No Has patient had a PCN reaction causing severe rash involving mucus membranes or skin necrosis: No Has patient had a PCN reaction that required hospitalization: Yes Has patient had a PCN reaction occurring within the last 10 years: No If all of the above answers  are "NO", then may proceed with Cephalosporin use.   Marland Kitchen Ultram [Tramadol Hcl] Nausea And Vomiting    Medications Prior to Admission  Medication Sig Dispense Refill  . acetaminophen (TYLENOL) 500 MG tablet Take 1 tablet (500 mg total) by mouth every 6 (six) hours as needed for moderate pain or headache. 30 tablet 0  . aspirin EC 81 MG tablet Take 81 mg by mouth every other day. AT NIGHT.    Marland Kitchen atenolol (TENORMIN) 50 MG tablet Take 50 mg by mouth at bedtime.    . cholecalciferol (VITAMIN D) 25 MCG (1000 UNIT) tablet Take 1,000 Units by mouth every evening.    . cyanocobalamin (,VITAMIN B-12,) 1000 MCG/ML injection Inject 1,000 mcg into the muscle every 30 (thirty) days.    . dapagliflozin propanediol (FARXIGA) 5 MG TABS tablet Take 5 mg by mouth daily.    . furosemide (LASIX) 20 MG tablet Take 20 mg by mouth every other day. IN THE MORNING    .  gabapentin (NEURONTIN) 300 MG capsule Take 300 mg by mouth at bedtime.    Marland Kitchen glimepiride (AMARYL) 2 MG tablet Take 2 mg by mouth daily.    Marland Kitchen lisinopril (PRINIVIL,ZESTRIL) 40 MG tablet Take 40 mg by mouth daily.    . metFORMIN (GLUCOPHAGE) 500 MG tablet Take 1,000 mg by mouth 2 (two) times daily with a meal.    . vitamin B-12 (CYANOCOBALAMIN) 1000 MCG tablet Take 1,000 mcg by mouth every evening.    Marland Kitchen ZINC OXIDE EX Apply 1 application topically 3 (three) times daily as needed (psoriasis flares).     Marland Kitchen HYDROcodone-acetaminophen (NORCO/VICODIN) 5-325 MG tablet Take 1 tablet by mouth every 6 (six) hours as needed for moderate pain. (Patient not taking: No sig reported) 30 tablet 0  . promethazine (PHENERGAN) 12.5 MG tablet Take 1 tablet (12.5 mg total) by mouth every 6 (six) hours as needed for nausea or vomiting. (Patient not taking: No sig reported) 30 tablet 5    Results for orders placed or performed during the hospital encounter of 06/17/20 (from the past 48 hour(s))  Basic metabolic panel     Status: Abnormal   Collection Time: 06/16/20  8:32 AM   Result Value Ref Range   Sodium 141 135 - 145 mmol/L   Potassium 4.1 3.5 - 5.1 mmol/L   Chloride 107 98 - 111 mmol/L   CO2 25 22 - 32 mmol/L   Glucose, Bld 110 (H) 70 - 99 mg/dL    Comment: Glucose reference range applies only to samples taken after fasting for at least 8 hours.   BUN 25 (H) 8 - 23 mg/dL   Creatinine, Ser 1.14 (H) 0.44 - 1.00 mg/dL   Calcium 9.4 8.9 - 10.3 mg/dL   GFR, Estimated 48 (L) >60 mL/min    Comment: (NOTE) Calculated using the CKD-EPI Creatinine Equation (2021)    Anion gap 9 5 - 15    Comment: Performed at [facility], [address], [place], [place] [address]  Glucose, capillary     Status: None   Collection Time: 06/17/20  9:17 AM  Result Value Ref Range   Glucose-Capillary 91 70 - 99 mg/dL    Comment: Glucose reference range applies only to samples taken after fasting for at least 8 hours.   No results found.  Review of Systems  Constitutional: Negative.   HENT: Negative.   Eyes: Negative.   Respiratory: Negative.   Cardiovascular: Negative.   Gastrointestinal: Negative.   Endocrine: Negative.   Genitourinary: Negative.   Musculoskeletal: Negative.   Skin: Negative.   Allergic/Immunologic: Negative.   Neurological: Negative.   Hematological: Negative.   Psychiatric/Behavioral: Negative.     Blood pressure (!) 164/63, pulse 73, temperature 97.8 F (36.6 C), temperature source Oral, resp. rate 13, height 5' 3"  (1.6 m), weight 61.3 kg, SpO2 100 %. Physical Exam  GENERAL: The patient is AO x3, in no acute distress. HEENT: Head is normocephalic and atraumatic. EOMI are intact. Mouth is well hydrated and without lesions. NECK: Supple. No masses LUNGS: Clear to auscultation. No presence of rhonchi/wheezing/rales. Adequate chest expansion HEART: RRR, normal s1 and s2. ABDOMEN: Soft, nontender, no guarding, no peritoneal signs, and nondistended. BS +. No masses. EXTREMITIES: Without any cyanosis, clubbing, rash, lesions or edema. NEUROLOGIC:  AOx3, no focal motor deficit. SKIN: no jaundice, no rashes  Assessment/Plan Jeanette Chang is a 80 y.o. female with PMH NASH cirrhosis, diabetes, hyperlipidemia, who presents for follow up of  esophageal varices and evaluation of duodenal  ampulla.  We will proceed with EGD with distal transparent cap.   Harvel Quale, MD 06/17/2020, 10:00 AM

## 2020-06-17 NOTE — Anesthesia Preprocedure Evaluation (Addendum)
Anesthesia Evaluation  Patient identified by MRN, date of birth, ID band Patient awake    Reviewed: Allergy & Precautions, NPO status , Patient's Chart, lab work & pertinent test results, reviewed documented beta blocker date and time   Airway Mallampati: II  TM Distance: >3 FB Neck ROM: Full    Dental  (+) Dental Advisory Given,    Pulmonary former smoker,    Pulmonary exam normal breath sounds clear to auscultation       Cardiovascular Exercise Tolerance: Good hypertension, Pt. on medications and Pt. on home beta blockers + Peripheral Vascular Disease (carotid sx)  Normal cardiovascular exam Rhythm:Regular Rate:Normal     Neuro/Psych  Headaches, negative psych ROS   GI/Hepatic negative GI ROS, (+) Cirrhosis   Esophageal Varices    , Hepatitis -  Endo/Other  diabetes, Well Controlled, Type 2, Oral Hypoglycemic Agents  Renal/GU      Musculoskeletal  (+) Arthritis ,   Abdominal   Peds  Hematology  (+) Blood dyscrasia (Banti syndrome), anemia ,   Anesthesia Other Findings Banti syndrome  Reproductive/Obstetrics                                                             Anesthesia Evaluation  Patient identified by MRN, date of birth, ID band Patient awake    Reviewed: Allergy & Precautions, NPO status , Patient's Chart, lab work & pertinent test results, reviewed documented beta blocker date and time   Airway Mallampati: III  TM Distance: >3 FB Neck ROM: Full    Dental  (+) Chipped, Missing, Dental Advisory Given,    Pulmonary former smoker,    Pulmonary exam normal breath sounds clear to auscultation       Cardiovascular METS: 3 - Mets hypertension, Pt. on medications and Pt. on home beta blockers + Peripheral Vascular Disease (carotid artery stenosis)  Normal cardiovascular exam Rhythm:Regular Rate:Normal  06-Jul-2019 10:23:57 [facility]  ROUTINE RECORD Normal sinus rhythm Low voltage QRS Nonspecific ST abnormality Abnormal ECG Confirmed by Carlyle Dolly [phone]) on 07/08/2019 3:11:09 PM   Neuro/Psych  Headaches,    GI/Hepatic negative GI ROS, (+) Cirrhosis  (nonalcoholic)      , Hepatitis -  Endo/Other  diabetes, Well Controlled, Type 2, Oral Hypoglycemic Agents  Renal/GU      Musculoskeletal  (+) Arthritis , Osteoarthritis,  Chronic left knee pain   Abdominal   Peds  Hematology  (+) Blood dyscrasia (thrombocytopenia), anemia ,   Anesthesia Other Findings   Reproductive/Obstetrics                             Anesthesia Physical Anesthesia Plan  ASA: III  Anesthesia Plan: General   Post-op Pain Management:    Induction: Intravenous  PONV Risk Score and Plan: 4 or greater and Ondansetron  Airway Management Planned: LMA  Additional Equipment:   Intra-op Plan:   Post-operative Plan: Extubation in OR  Informed Consent: I have reviewed the patients History and Physical, chart, labs and discussed the procedure including the risks, benefits and alternatives for the proposed anesthesia with the patient or authorized representative who has indicated his/her understanding and acceptance.     Dental advisory given  Plan Discussed with: CRNA and Surgeon  Anesthesia Plan Comments:         Anesthesia Quick Evaluation                                   Anesthesia Evaluation  Patient identified by MRN, date of birth, ID band Patient awake    Reviewed: Allergy & Precautions, NPO status , Patient's Chart, lab work & pertinent test results, reviewed documented beta blocker date and time   Airway Mallampati: III  TM Distance: >3 FB Neck ROM: Full    Dental  (+) Chipped, Missing, Dental Advisory Given,    Pulmonary former smoker,    Pulmonary exam normal breath sounds clear to auscultation       Cardiovascular METS: 3 - Mets hypertension, Pt. on  medications and Pt. on home beta blockers + Peripheral Vascular Disease (carotid artery stenosis)  Normal cardiovascular exam Rhythm:Regular Rate:Normal  06-Jul-2019 10:23:57 [facility] ROUTINE RECORD Normal sinus rhythm Low voltage QRS Nonspecific ST abnormality Abnormal ECG Confirmed by Carlyle Dolly [phone]) on 07/08/2019 3:11:09 PM   Neuro/Psych  Headaches,    GI/Hepatic negative GI ROS, (+) Cirrhosis  (nonalcoholic)      , Hepatitis -  Endo/Other  diabetes, Well Controlled, Type 2, Oral Hypoglycemic Agents  Renal/GU      Musculoskeletal  (+) Arthritis , Osteoarthritis,  Chronic left knee pain   Abdominal   Peds  Hematology  (+) Blood dyscrasia (thrombocytopenia), anemia ,   Anesthesia Other Findings   Reproductive/Obstetrics                             Anesthesia Physical Anesthesia Plan  ASA: III  Anesthesia Plan: General   Post-op Pain Management:    Induction: Intravenous  PONV Risk Score and Plan: 4 or greater and Ondansetron  Airway Management Planned: LMA  Additional Equipment:   Intra-op Plan:   Post-operative Plan: Extubation in OR  Informed Consent: I have reviewed the patients History and Physical, chart, labs and discussed the procedure including the risks, benefits and alternatives for the proposed anesthesia with the patient or authorized representative who has indicated his/her understanding and acceptance.     Dental advisory given  Plan Discussed with: CRNA and Surgeon  Anesthesia Plan Comments:         Anesthesia Quick Evaluation  Anesthesia Physical Anesthesia Plan  ASA: III  Anesthesia Plan: General   Post-op Pain Management:    Induction: Intravenous  PONV Risk Score and Plan: Propofol infusion  Airway Management Planned: Nasal Cannula and Natural Airway  Additional Equipment:   Intra-op Plan:   Post-operative Plan:   Informed Consent: I have reviewed  the patients History and Physical, chart, labs and discussed the procedure including the risks, benefits and alternatives for the proposed anesthesia with the patient or authorized representative who has indicated his/her understanding and acceptance.     Dental advisory given  Plan Discussed with: CRNA and Surgeon  Anesthesia Plan Comments:        Anesthesia Quick Evaluation

## 2020-06-17 NOTE — Discharge Instructions (Signed)
You are being discharged to home.  Resume your previous diet.  Start Coreg 3.125 mg twice a day, stop atenolol. We are waiting for your pathology results.    Upper Endoscopy, Adult, Care After This sheet gives you information about how to care for yourself after your procedure. Your health care provider may also give you more specific instructions. If you have problems or questions, contact your health care provider. What can I expect after the procedure? After the procedure, it is common to have:  A sore throat.  Mild stomach pain or discomfort.  Bloating.  Nausea. Follow these instructions at home:  Follow instructions from your health care provider about what to eat or drink after your procedure.  Return to your normal activities as told by your health care provider. Ask your health care provider what activities are safe for you.  Take over-the-counter and prescription medicines only as told by your health care provider.  If you were given a sedative during the procedure, it can affect you for several hours. Do not drive or operate machinery until your health care provider says that it is safe.  Keep all follow-up visits as told by your health care provider. This is important.   Contact a health care provider if you have:  A sore throat that lasts longer than one day.  Trouble swallowing. Get help right away if:  You vomit blood or your vomit looks like coffee grounds.  You have: ? A fever. ? Bloody, black, or tarry stools. ? A severe sore throat or you cannot swallow. ? Difficulty breathing. ? Severe pain in your chest or abdomen. Summary  After the procedure, it is common to have a sore throat, mild stomach discomfort, bloating, and nausea.  If you were given a sedative during the procedure, it can affect you for several hours. Do not drive or operate machinery until your health care provider says that it is safe.  Follow instructions from your health care  provider about what to eat or drink after your procedure.  Return to your normal activities as told by your health care provider. This information is not intended to replace advice given to you by your health care provider. Make sure you discuss any questions you have with your health care provider. Document Revised: 01/30/2019 Document Reviewed: 07/04/2017 Elsevier Patient Education  2021 [employer].

## 2020-06-17 NOTE — Anesthesia Procedure Notes (Signed)
Date/Time: 06/17/2020 10:10 AM Performed by: Vista Deck, CRNA Pre-anesthesia Checklist: Patient identified, Emergency Drugs available, Suction available, Timeout performed and Patient being monitored Patient Re-evaluated:Patient Re-evaluated prior to induction Oxygen Delivery Method: Nasal Cannula

## 2020-06-17 NOTE — Op Note (Signed)
[facility], [place] Patient Name: Jeanette Chang Procedure Date: 06/17/2020 9:37 AM MRN: [id number] Date of Birth: 03-Aug-1940 Attending MD: [name] ,  CSN: [id number] Age: 80 Admit Type: Outpatient Procedure:                Upper GI endoscopy Indications:              Follow-up of esophageal varices Providers:                [name], Caprice Kluver, Raphael Gibney,                            Technician Referring MD:              Medicines:                Monitored Anesthesia Care Complications:            No immediate complications. Estimated Blood Loss:     Estimated blood loss: none. Procedure:                Pre-Anesthesia Assessment:                           - Prior to the procedure, a History and Physical                            was performed, and patient medications, allergies                            and sensitivities were reviewed. The patient's                            tolerance of previous anesthesia was reviewed.                           - The risks and benefits of the procedure and the                            sedation options and risks were discussed with the                            patient. All questions were answered and informed                            consent was obtained.                           - ASA Grade Assessment: II - A patient with mild                            systemic disease.                           - Adequate visualization was aided with the use of                            a transparent cap attached to the distal part  of                            the endoscope.                           After obtaining informed consent, the endoscope was                            passed under direct vision. Throughout the                            procedure, the patient's blood pressure, pulse, and                            oxygen saturations were monitored continuously.The                            upper GI endoscopy was  accomplished without                            difficulty. The patient tolerated the procedure                            well. The GIF-H190 ([id number]) scope was introduced                            through the mouth, and advanced to the second part                            of duodenum. Scope In: 10:19:38 AM Scope Out: 10:35:08 AM Total Procedure Duration: 0 hours 15 minutes 30 seconds  Findings:      Grade II varices were found in the lower third of the esophagus.      An area of irregularity was found in the posterior rim of the pyloric       channed. This was visualized with the aid of a transparent cap. I pulled       the area with a cold forceps and was able to see a medium-sized - 1 cm,       frond-like/villous, non-circumferential mass with no bleeding and no       stigmata of recent bleeding was found at the pylorus. It had some       adenomatous features - unclear if coming from pylorus or from duodenal       bulb. One biopsy was taken with a cold forceps for histology, no more       biopsies were performed as patient presented significant bleeding that       stopped on its own.      The examined duodenum and ampulla were normal. Impression:               - Grade II esophageal varices.                           - Tumor at the pylorus - unknown if primary from  pylorus or duodenum. Biopsied.                           - Normal examined duodenum. Moderate Sedation:      Per Anesthesia Care Recommendation:           - Discharge patient to home (ambulatory).                           - Resume previous diet.                           - Start Coreg 3.125 mg twice a day, stop atenolol.                           - Await pathology results. Procedure Code(s):        --- Professional ---                           [phone], Esophagogastroduodenoscopy, flexible,                            transoral; with biopsy, single or multiple Diagnosis Code(s):        ---  Professional ---                           I85.00, Esophageal varices without bleeding                           D49.0, Neoplasm of unspecified behavior of                            digestive system CPT copyright 2019 American [employer] Association. All rights reserved. The codes documented in this report are preliminary and upon coder review may  be revised to meet current compliance requirements. [name], MD [name],  06/17/2020 10:50:40 AM This report has been signed electronically. Number of Addenda: 0

## 2020-06-18 DIAGNOSIS — M23322 Other meniscus derangements, posterior horn of medial meniscus, left knee: Secondary | ICD-10-CM | POA: Diagnosis not present

## 2020-06-18 DIAGNOSIS — D508 Other iron deficiency anemias: Secondary | ICD-10-CM | POA: Diagnosis not present

## 2020-06-18 DIAGNOSIS — Z6826 Body mass index (BMI) 26.0-26.9, adult: Secondary | ICD-10-CM | POA: Diagnosis not present

## 2020-06-18 DIAGNOSIS — K7460 Unspecified cirrhosis of liver: Secondary | ICD-10-CM | POA: Diagnosis not present

## 2020-06-18 DIAGNOSIS — K769 Liver disease, unspecified: Secondary | ICD-10-CM | POA: Diagnosis not present

## 2020-06-18 DIAGNOSIS — D131 Benign neoplasm of stomach: Secondary | ICD-10-CM | POA: Diagnosis not present

## 2020-06-18 DIAGNOSIS — D500 Iron deficiency anemia secondary to blood loss (chronic): Secondary | ICD-10-CM | POA: Diagnosis not present

## 2020-06-18 DIAGNOSIS — D696 Thrombocytopenia, unspecified: Secondary | ICD-10-CM | POA: Diagnosis not present

## 2020-06-19 LAB — SURGICAL PATHOLOGY

## 2020-06-24 ENCOUNTER — Encounter (HOSPITAL_COMMUNITY): Payer: Self-pay | Admitting: Gastroenterology

## 2020-07-21 DIAGNOSIS — D708 Other neutropenia: Secondary | ICD-10-CM | POA: Diagnosis not present

## 2020-07-21 DIAGNOSIS — D508 Other iron deficiency anemias: Secondary | ICD-10-CM | POA: Diagnosis not present

## 2020-07-21 DIAGNOSIS — K7469 Other cirrhosis of liver: Secondary | ICD-10-CM | POA: Diagnosis not present

## 2020-07-21 DIAGNOSIS — E538 Deficiency of other specified B group vitamins: Secondary | ICD-10-CM | POA: Diagnosis not present

## 2020-07-24 DIAGNOSIS — D508 Other iron deficiency anemias: Secondary | ICD-10-CM | POA: Diagnosis not present

## 2020-07-24 DIAGNOSIS — Z6825 Body mass index (BMI) 25.0-25.9, adult: Secondary | ICD-10-CM | POA: Diagnosis not present

## 2020-08-07 DIAGNOSIS — G8929 Other chronic pain: Secondary | ICD-10-CM | POA: Diagnosis not present

## 2020-08-07 DIAGNOSIS — M25562 Pain in left knee: Secondary | ICD-10-CM | POA: Diagnosis not present

## 2020-08-07 DIAGNOSIS — M833 Adult osteomalacia due to malnutrition: Secondary | ICD-10-CM | POA: Diagnosis not present

## 2020-08-07 DIAGNOSIS — D61818 Other pancytopenia: Secondary | ICD-10-CM | POA: Diagnosis not present

## 2020-08-07 DIAGNOSIS — S83242A Other tear of medial meniscus, current injury, left knee, initial encounter: Secondary | ICD-10-CM | POA: Diagnosis not present

## 2020-08-07 DIAGNOSIS — M1712 Unilateral primary osteoarthritis, left knee: Secondary | ICD-10-CM | POA: Diagnosis not present

## 2020-08-21 DIAGNOSIS — E559 Vitamin D deficiency, unspecified: Secondary | ICD-10-CM | POA: Diagnosis not present

## 2020-08-21 DIAGNOSIS — E1142 Type 2 diabetes mellitus with diabetic polyneuropathy: Secondary | ICD-10-CM | POA: Diagnosis not present

## 2020-08-21 DIAGNOSIS — D61818 Other pancytopenia: Secondary | ICD-10-CM | POA: Diagnosis not present

## 2020-08-21 DIAGNOSIS — Z79899 Other long term (current) drug therapy: Secondary | ICD-10-CM | POA: Diagnosis not present

## 2020-08-21 DIAGNOSIS — R5383 Other fatigue: Secondary | ICD-10-CM | POA: Diagnosis not present

## 2020-08-21 DIAGNOSIS — Z299 Encounter for prophylactic measures, unspecified: Secondary | ICD-10-CM | POA: Diagnosis not present

## 2020-08-21 DIAGNOSIS — G8194 Hemiplegia, unspecified affecting left nondominant side: Secondary | ICD-10-CM | POA: Diagnosis not present

## 2020-08-21 DIAGNOSIS — E1165 Type 2 diabetes mellitus with hyperglycemia: Secondary | ICD-10-CM | POA: Diagnosis not present

## 2020-08-21 DIAGNOSIS — E7800 Pure hypercholesterolemia, unspecified: Secondary | ICD-10-CM | POA: Diagnosis not present

## 2020-08-21 DIAGNOSIS — I10 Essential (primary) hypertension: Secondary | ICD-10-CM | POA: Diagnosis not present

## 2020-08-25 DIAGNOSIS — D508 Other iron deficiency anemias: Secondary | ICD-10-CM | POA: Diagnosis not present

## 2020-08-25 DIAGNOSIS — E559 Vitamin D deficiency, unspecified: Secondary | ICD-10-CM | POA: Diagnosis not present

## 2020-08-25 DIAGNOSIS — D696 Thrombocytopenia, unspecified: Secondary | ICD-10-CM | POA: Diagnosis not present

## 2020-08-25 DIAGNOSIS — K7460 Unspecified cirrhosis of liver: Secondary | ICD-10-CM | POA: Diagnosis not present

## 2020-08-25 DIAGNOSIS — K769 Liver disease, unspecified: Secondary | ICD-10-CM | POA: Diagnosis not present

## 2020-08-25 DIAGNOSIS — Z1159 Encounter for screening for other viral diseases: Secondary | ICD-10-CM | POA: Diagnosis not present

## 2020-08-25 DIAGNOSIS — Z7289 Other problems related to lifestyle: Secondary | ICD-10-CM | POA: Diagnosis not present

## 2020-08-25 DIAGNOSIS — R58 Hemorrhage, not elsewhere classified: Secondary | ICD-10-CM | POA: Diagnosis not present

## 2020-08-25 DIAGNOSIS — Z6825 Body mass index (BMI) 25.0-25.9, adult: Secondary | ICD-10-CM | POA: Diagnosis not present

## 2020-08-25 DIAGNOSIS — D708 Other neutropenia: Secondary | ICD-10-CM | POA: Diagnosis not present

## 2020-08-25 DIAGNOSIS — E538 Deficiency of other specified B group vitamins: Secondary | ICD-10-CM | POA: Diagnosis not present

## 2020-08-26 DIAGNOSIS — Z1159 Encounter for screening for other viral diseases: Secondary | ICD-10-CM | POA: Diagnosis not present

## 2020-08-26 DIAGNOSIS — K769 Liver disease, unspecified: Secondary | ICD-10-CM | POA: Diagnosis not present

## 2020-08-26 DIAGNOSIS — E538 Deficiency of other specified B group vitamins: Secondary | ICD-10-CM | POA: Diagnosis not present

## 2020-08-26 DIAGNOSIS — D508 Other iron deficiency anemias: Secondary | ICD-10-CM | POA: Diagnosis not present

## 2020-08-26 DIAGNOSIS — R58 Hemorrhage, not elsewhere classified: Secondary | ICD-10-CM | POA: Diagnosis not present

## 2020-08-26 DIAGNOSIS — Z7289 Other problems related to lifestyle: Secondary | ICD-10-CM | POA: Diagnosis not present

## 2020-08-26 DIAGNOSIS — E559 Vitamin D deficiency, unspecified: Secondary | ICD-10-CM | POA: Diagnosis not present

## 2020-08-26 DIAGNOSIS — K7460 Unspecified cirrhosis of liver: Secondary | ICD-10-CM | POA: Diagnosis not present

## 2020-08-26 DIAGNOSIS — D708 Other neutropenia: Secondary | ICD-10-CM | POA: Diagnosis not present

## 2020-08-26 DIAGNOSIS — D696 Thrombocytopenia, unspecified: Secondary | ICD-10-CM | POA: Diagnosis not present

## 2020-08-28 DIAGNOSIS — R58 Hemorrhage, not elsewhere classified: Secondary | ICD-10-CM | POA: Diagnosis not present

## 2020-08-28 DIAGNOSIS — K7460 Unspecified cirrhosis of liver: Secondary | ICD-10-CM | POA: Diagnosis not present

## 2020-09-01 DIAGNOSIS — R161 Splenomegaly, not elsewhere classified: Secondary | ICD-10-CM | POA: Diagnosis not present

## 2020-09-01 DIAGNOSIS — K7460 Unspecified cirrhosis of liver: Secondary | ICD-10-CM | POA: Diagnosis not present

## 2020-09-01 DIAGNOSIS — N281 Cyst of kidney, acquired: Secondary | ICD-10-CM | POA: Diagnosis not present

## 2020-09-01 DIAGNOSIS — I999 Unspecified disorder of circulatory system: Secondary | ICD-10-CM | POA: Diagnosis not present

## 2020-09-09 DIAGNOSIS — Z7289 Other problems related to lifestyle: Secondary | ICD-10-CM | POA: Diagnosis not present

## 2020-09-09 DIAGNOSIS — Z1159 Encounter for screening for other viral diseases: Secondary | ICD-10-CM | POA: Diagnosis not present

## 2020-09-09 DIAGNOSIS — D759 Disease of blood and blood-forming organs, unspecified: Secondary | ICD-10-CM | POA: Diagnosis not present

## 2020-09-09 DIAGNOSIS — K7460 Unspecified cirrhosis of liver: Secondary | ICD-10-CM | POA: Diagnosis not present

## 2020-09-09 DIAGNOSIS — D696 Thrombocytopenia, unspecified: Secondary | ICD-10-CM | POA: Diagnosis not present

## 2020-09-09 DIAGNOSIS — R58 Hemorrhage, not elsewhere classified: Secondary | ICD-10-CM | POA: Diagnosis not present

## 2020-09-09 DIAGNOSIS — D61818 Other pancytopenia: Secondary | ICD-10-CM | POA: Diagnosis not present

## 2020-09-14 DIAGNOSIS — I10 Essential (primary) hypertension: Secondary | ICD-10-CM | POA: Diagnosis not present

## 2020-09-14 DIAGNOSIS — E1165 Type 2 diabetes mellitus with hyperglycemia: Secondary | ICD-10-CM | POA: Diagnosis not present

## 2020-09-14 DIAGNOSIS — M1712 Unilateral primary osteoarthritis, left knee: Secondary | ICD-10-CM | POA: Diagnosis not present

## 2020-09-15 DIAGNOSIS — H353132 Nonexudative age-related macular degeneration, bilateral, intermediate dry stage: Secondary | ICD-10-CM | POA: Diagnosis not present

## 2020-09-15 DIAGNOSIS — H43811 Vitreous degeneration, right eye: Secondary | ICD-10-CM | POA: Diagnosis not present

## 2020-09-17 DIAGNOSIS — D508 Other iron deficiency anemias: Secondary | ICD-10-CM | POA: Diagnosis not present

## 2020-10-09 ENCOUNTER — Ambulatory Visit (INDEPENDENT_AMBULATORY_CARE_PROVIDER_SITE_OTHER): Payer: Medicare Other | Admitting: Gastroenterology

## 2020-10-09 ENCOUNTER — Other Ambulatory Visit: Payer: Self-pay

## 2020-10-09 ENCOUNTER — Encounter (INDEPENDENT_AMBULATORY_CARE_PROVIDER_SITE_OTHER): Payer: Self-pay | Admitting: Gastroenterology

## 2020-10-09 VITALS — BP 151/61 | HR 75 | Temp 98.0°F | Ht 63.0 in | Wt 136.1 lb

## 2020-10-09 DIAGNOSIS — K7460 Unspecified cirrhosis of liver: Secondary | ICD-10-CM

## 2020-10-09 DIAGNOSIS — K7581 Nonalcoholic steatohepatitis (NASH): Secondary | ICD-10-CM

## 2020-10-09 DIAGNOSIS — I8510 Secondary esophageal varices without bleeding: Secondary | ICD-10-CM

## 2020-10-09 DIAGNOSIS — I8500 Esophageal varices without bleeding: Secondary | ICD-10-CM | POA: Insufficient documentation

## 2020-10-09 DIAGNOSIS — K317 Polyp of stomach and duodenum: Secondary | ICD-10-CM | POA: Diagnosis not present

## 2020-10-09 MED ORDER — CARVEDILOL 6.25 MG PO TABS: 6.2500 mg | ORAL_TABLET | Freq: Two times a day (BID) | ORAL | 3 refills | Status: DC

## 2020-10-09 NOTE — Patient Instructions (Addendum)
Perform blood workup Schedule liver US Increase carvedilol to 6.25 mg every 12 hours Will be referred to [facility] for evaluation of large inflammatory gastric polyp per request of Dr. Linus Mako Continue rest of medication Reduce salt intake to <2 g per day Can take Tylenol max of 2 g per day (650 mg q8h) for pain Avoid NSAIDs for pain Avoid eating raw oysters/shellfish Ensure every night before going to sleep

## 2020-10-09 NOTE — Progress Notes (Signed)
Jeanette Chang, M.D. Gastroenterology & Hepatology [facility] [address] [place], [place] [address]  Primary Care Physician: Monico Blitz, [place] [address]  I will communicate my assessment and recommendations to the referring MD via EMR.  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 female with PMH NASH cirrhosis, diabetes, hyperlipidemia, who presents for follow up of liver cirrhosis.  The patient was last seen on 05/14/2020. At that time, the patient was scheduled to undergo an EGD.She underwent an EGD on 06/17/2020.  Was found to have grade 2 esophageal varices, there was presence of a 1 cm polypoid.  Mass at the pylorus which was biopsied x1 given presence of bleeding (presence of granulation tissue and mixed inflammation negative for H. pylori but no presence of malignancy, likely an inflammatory polyp), normal duodenum.  At that time she was started on carvedilol 3.125 mg twice a day which she has been able to tolerate adequately.  Liver ultrasound performed in March 2022 was within normal limits. Labs were stable with presence of a low MELD score of 9 and negative AFP.  Patient reports that she has had nausea and some early satiety. She states this has been ongoing since the last time she was seen in the clinic. Can only eat twice a day as she gets full easily but reported she has not lost any weight.  She only has noticed that it increases if she does not take her diuretics.  Patient reports that  a month ago she had an isolated episode of fresh blood in her stool and some clots mixed with her stool.  She is scheduled to see her orthopedic surgeon in September for succussion a potential left knee replacement.  Patient reports that she has presented intermittent diarrhea when taking metformin, as she has noticed that when she skips a  dose she does not have diarrhea anymore.  Per clinical notes from her hematologist office (Dr. Emmaline Kluver) they would like to have a referral to [facility] gastroenterology service for management of her gastric polyp as she has presented some anemia.  The patient denies having any confusion, fever, chills, melena, hematemesis, abdominal distention, abdominal pain, diarrhea, jaundice, pruritus.  Last EGD:as above Last Colonoscopy:  06/2019 - two small cecal and SF polyps, one cecal 6 mm polyp. All polyps were TA. Hemorrhoids  Past Medical History: Past Medical History:  Diagnosis Date   Abnormal LFTs    Achilles bursitis or tendinitis    Acute maxillary sinusitis    Acute pharyngitis    Anemia, unspecified    Candidiasis of skin and nails    Dermatophytosis of foot    Dermatophytosis of the body    Diverticulitis of colon (without mention of hemorrhage)(562.11)    Edema    Headache(784.0)    HOH (hard of hearing)    Malaise and fatigue    Occlusion and stenosis of carotid artery without mention of cerebral infarction    Osteoarthrosis, unspecified whether generalized or localized, unspecified site    Other dyspnea and respiratory abnormality    Other psoriasis    Other specified disorders of rotator cuff syndrome of shoulder and allied disorders    Pain in joint    Pure hypercholesterolemia    RUQ pain    Spasm of back muscles    Thrombocytopenia, unspecified (HCC)    Type II or unspecified type diabetes  mellitus without mention of complication, not stated as uncontrolled    Unspecified essential hypertension    Unspecified sinusitis (chronic)    Urinary incontinence     Past Surgical History: Past Surgical History:  Procedure Laterality Date   APPENDECTOMY  1964   BIOPSY  03/02/2017   Procedure: BIOPSY;  Surgeon: Rogene Houston, MD;  [facility];  Service: Endoscopy;;  antral   BIOPSY  06/17/2020   Procedure: BIOPSY;  Surgeon: Harvel Quale, MD;   [facility];  Service: Gastroenterology;;   CAROTID ENDARTERECTOMY  2005   Left CEA   CAROTID ENDARTERECTOMY  2009   Right CEA   CATARACT EXTRACTION, BILATERAL Bilateral    CHOLECYSTECTOMY  1982   Gall Bladder   COLONOSCOPY  09   COLONOSCOPY N/A 06/27/2019   Procedure: COLONOSCOPY;  Surgeon: Rogene Houston, MD;  [facility];  Service: Endoscopy;  Laterality: N/A;   ESOPHAGOGASTRODUODENOSCOPY N/A 03/02/2017   Procedure: ESOPHAGOGASTRODUODENOSCOPY (EGD);  Surgeon: Rogene Houston, MD;  [facility];  Service: Endoscopy;  Laterality: N/A;  12:55   ESOPHAGOGASTRODUODENOSCOPY N/A 06/27/2019   Procedure: ESOPHAGOGASTRODUODENOSCOPY (EGD);  Surgeon: Rogene Houston, MD;  [facility];  Service: Endoscopy;  Laterality: N/A;  730   ESOPHAGOGASTRODUODENOSCOPY (EGD) WITH PROPOFOL N/A 06/17/2020   Procedure: ESOPHAGOGASTRODUODENOSCOPY (EGD) WITH PROPOFOL;  Surgeon: Harvel Quale, MD;  [facility];  Service: Gastroenterology;  Laterality: N/A;  AM   GIVENS CAPSULE STUDY N/A 10/24/2019   Procedure: GIVENS CAPSULE STUDY;  Surgeon: Rogene Houston, MD;  [facility];  Service: Endoscopy;  Laterality: N/A;  730   KNEE ARTHROSCOPY WITH MEDIAL MENISECTOMY Left 07/10/2019   Procedure: KNEE ARTHROSCOPY WITH MEDIAL MENISCECTOMY;  Surgeon: Carole Civil, MD;  Location: [facility];  Service: Orthopedics;  Laterality: Left;   KNEE ARTHROSCOPY WITH MEDIAL MENISECTOMY Left 03/14/2020   Procedure: KNEE ARTHROSCOPY WITH MEDIAL MENISCECTOMY;  Surgeon: Carole Civil, MD;  Location: [facility];  Service: Orthopedics;  Laterality: Left;   NOSE SURGERY  1978   PARATHYROIDECTOMY  1992   POLYPECTOMY  03/02/2017   Procedure: POLYPECTOMY;  Surgeon: Rogene Houston, MD;  [facility];  Service: Endoscopy;;  gastric    POLYPECTOMY  06/27/2019   Procedure: POLYPECTOMY;  Surgeon: Rogene Houston, MD;  [facility];  Service:  Endoscopy;;  colon   Power port  03/06/2009   Right upper chest    TONSILLECTOMY     TUMOR EXCISION Right August 24, 2013   [facility] Dr. Ellen Henri, ENT   VAGINAL HYSTERECTOMY  1972    Family History: Family History  Problem Relation Age of Onset   Heart attack Mother    Cancer Mother    Heart attack Father    Cancer Sister     Social History: Social History   Tobacco Use  Smoking Status Former   Packs/day: 1.00   Years: 42.00   Pack years: 42.00   Types: Cigarettes   Quit date: 03/12/1992   Years since quitting: 28.5  Smokeless Tobacco Former   Quit date: 12/16/1992   Social History   Substance and Sexual Activity  Alcohol Use Yes   Alcohol/week: 1.0 standard drink   Types: 1 Glasses of wine per week   Comment: very seldom   Social History   Substance and Sexual Activity  Drug Use No    Allergies: Allergies  Allergen Reactions  Codeine Nausea And Vomiting and Rash   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   Toviaz [Fesoterodine Fumarate] Other (See Comments)    Cramps, severe headache   Norco [Hydrocodone-Acetaminophen] Nausea And Vomiting and Anxiety   Penicillins Swelling, Rash and Other (See Comments)    Has patient had a PCN reaction causing immediate rash, facial/tongue/throat swelling, SOB or lightheadedness with hypotension: No Has patient had a PCN  reaction causing severe rash involving mucus membranes or skin necrosis: No Has patient had a PCN reaction that required hospitalization: Yes Has patient had a PCN reaction occurring within the last 10 years: No If all of the above answers are "NO", then may proceed with Cephalosporin use.    Ultram [Tramadol Hcl] Nausea And Vomiting    Medications: Current Outpatient Medications  Medication Sig Dispense Refill   acetaminophen (TYLENOL) 500 MG tablet Take 1 tablet (500 mg total) by mouth every 6 (six) hours as needed for moderate pain or headache. 30 tablet 0   aspirin EC 81 MG tablet Take 81 mg by mouth every other day. AT NIGHT.     carvedilol (COREG) 3.125 MG tablet Take 1 tablet (3.125 mg total) by mouth 2 (two) times daily. 180 tablet 1   cholecalciferol (VITAMIN D) 25 MCG (1000 UNIT) tablet Take 1,000 Units by mouth every evening.     cyanocobalamin (,VITAMIN B-12,) 1000 MCG/ML injection Inject 1,000 mcg into the muscle every 30 (thirty) days.     dapagliflozin propanediol (FARXIGA) 5 MG TABS tablet Take 5 mg by mouth daily.     furosemide (LASIX) 20 MG tablet Take 20 mg by mouth daily. IN THE MORNING     gabapentin (NEURONTIN) 300 MG capsule Take 300 mg by mouth at bedtime.     glimepiride (AMARYL) 2 MG tablet Take 2 mg by mouth daily.     lisinopril (PRINIVIL,ZESTRIL) 40 MG tablet Take 40 mg by mouth daily.     metFORMIN (GLUCOPHAGE) 500 MG tablet Take 1,000 mg by mouth 2 (two) times daily with a meal.     vitamin B-12 (CYANOCOBALAMIN) 1000 MCG tablet Take 1,000 mcg by mouth every evening.     ZINC OXIDE EX Apply 1 application topically 3 (three) times daily as needed (psoriasis flares).      No current facility-administered medications for this visit.   Facility-Administered Medications Ordered in Other Visits  Medication Dose Route Frequency Provider Last Rate Last Admin   sodium chloride irrigation 0.9 %    PRN Carole Civil, MD   1,000 mL at 03/14/20 0945    Review  of Systems: GENERAL: negative for malaise, night sweats HEENT: No changes in hearing or vision, no nose bleeds or other nasal problems. NECK: Negative for lumps, goiter, pain and significant neck swelling RESPIRATORY: Negative for cough, wheezing CARDIOVASCULAR: Negative for chest pain, leg swelling,  palpitations, orthopnea GI: SEE HPI MUSCULOSKELETAL: Negative for joint pain or swelling, back pain, and muscle pain. SKIN: Negative for lesions, rash PSYCH: Negative for sleep disturbance, mood disorder and recent psychosocial stressors. HEMATOLOGY Negative for prolonged bleeding, bruising easily, and swollen nodes. ENDOCRINE: Negative for cold or heat intolerance, polyuria, polydipsia and goiter. NEURO: negative for tremor, gait imbalance, syncope and seizures. The remainder of the review of systems is noncontributory.   Physical Exam: BP (!) 151/61 (BP Location: Left Arm, Patient Position: Sitting, Cuff Size: Small)   Pulse 75   Temp 98 F (36.7 C) (Oral)   Ht 5' 3"  (1.6 m)   Wt 136 lb 1.6 oz (61.7 kg)   BMI 24.11 kg/m  GENERAL: The patient is AO x3, in no acute distress. Elder. HEENT: Head is normocephalic and atraumatic. EOMI are intact. Mouth is well hydrated and without lesions. NECK: Supple. No masses LUNGS: Clear to auscultation. No presence of rhonchi/wheezing/rales. Adequate chest expansion HEART: RRR, normal s1 and s2. ABDOMEN: Soft, nontender, no guarding, no peritoneal signs, and nondistended. BS +. No masses. EXTREMITIES: Without any cyanosis, clubbing, rash, lesions or edema. NEUROLOGIC: AOx3, no focal motor deficit. SKIN: no jaundice, no rashes  Imaging/Labs: as above  I personally reviewed and interpreted the available labs, imaging and endoscopic files.  Impression and Plan: Jeanette Chang is a 80 y.o. female with PMH NASH cirrhosis, diabetes, hyperlipidemia, who presents for follow up of liver cirrhosis.  Regarding her liver cirrhosis, she has not presented  any decompensating events and is a CPT - A class cirrhotic.  She had presence of grade 2 esophageal varices which are being managed pharmacologically with carvedilol.  As she has tolerated this medication, we will uptitrate his dosage to 6.25 mg twice a day.  She is due for repeat labs which will be ordered today, as well as AFP and repeat ultrasound for [facility] screening.  The patient had evidence of large gastric polyp in the pylorus during her most recent endoscopic evaluation.  It did not have any active bleeding but after biopsying the mass x1 she had significant bleeding due to her underlying thrombocytopenia.  Based on the request of her hematologist Dr. Lavone Neri, I we will refer her for evaluation at a tertiary center [facility]) for evaluation of this polypoid mass which I think is inflammatory in nature. Gioven her significant thrombocytopenia, will like a second opinion for possible endoscopic management in a tertiary facility. It is possible that her polyp could have led to some intermittent chronic blood losses previously but her hemoglobin has been relatively stable (Hb 10.2 on 09/09/20) and her iron stores have been normal (08/25/20 - TIBC 294, sat 22%, iron 66, ferritin 272).   - Check CBC, MELD labs and AFP - Schedule liver US - Increase carvedilol to 6.25 mg every 12 hours - Will be referred to [facility] for evaluation of large inflammatory gastric polyp per request of Dr. Linus Mako - Continue diuretics - Reduce salt intake to <2 g per day - Can take Tylenol max of 2 g per day (650 mg q8h) for pain - Avoid NSAIDs for pain - Avoid eating raw oysters/shellfish - Ensure every night before going to sleep - RTC 6 months  All questions were answered.      Harvel Quale, MD Gastroenterology and Hepatology [facility]

## 2020-10-10 DIAGNOSIS — K7581 Nonalcoholic steatohepatitis (NASH): Secondary | ICD-10-CM | POA: Diagnosis not present

## 2020-10-10 DIAGNOSIS — K7460 Unspecified cirrhosis of liver: Secondary | ICD-10-CM | POA: Diagnosis not present

## 2020-10-13 LAB — COMPREHENSIVE METABOLIC PANEL
AG Ratio: 1.3 (calc) (ref 1.0–2.5)
ALT: 18 U/L (ref 6–29)
AST: 24 U/L (ref 10–35)
Albumin: 3.9 g/dL (ref 3.6–5.1)
Alkaline phosphatase (APISO): 83 U/L (ref 37–153)
BUN/Creatinine Ratio: 31 (calc) — ABNORMAL HIGH (ref 6–22)
BUN: 32 mg/dL — ABNORMAL HIGH (ref 7–25)
CO2: 27 mmol/L (ref 20–32)
Calcium: 9.2 mg/dL (ref 8.6–10.4)
Chloride: 106 mmol/L (ref 98–110)
Creat: 1.02 mg/dL — ABNORMAL HIGH (ref 0.60–0.95)
Globulin: 2.9 g/dL (calc) (ref 1.9–3.7)
Glucose, Bld: 75 mg/dL (ref 65–99)
Potassium: 4.2 mmol/L (ref 3.5–5.3)
Sodium: 144 mmol/L (ref 135–146)
Total Bilirubin: 0.4 mg/dL (ref 0.2–1.2)
Total Protein: 6.8 g/dL (ref 6.1–8.1)

## 2020-10-13 LAB — CBC WITH DIFFERENTIAL/PLATELET
Absolute Monocytes: 102 cells/uL — ABNORMAL LOW (ref 200–950)
Basophils Absolute: 10 cells/uL (ref 0–200)
Basophils Relative: 0.5 %
Eosinophils Absolute: 50 cells/uL (ref 15–500)
Eosinophils Relative: 2.5 %
HCT: 31.3 % — ABNORMAL LOW (ref 35.0–45.0)
Hemoglobin: 10 g/dL — ABNORMAL LOW (ref 11.7–15.5)
Lymphs Abs: 920 cells/uL (ref 850–3900)
MCH: 31.3 pg (ref 27.0–33.0)
MCHC: 31.9 g/dL — ABNORMAL LOW (ref 32.0–36.0)
MCV: 97.8 fL (ref 80.0–100.0)
MPV: 11.8 fL (ref 7.5–12.5)
Monocytes Relative: 5.1 %
Neutro Abs: 918 cells/uL — ABNORMAL LOW (ref 1500–7800)
Neutrophils Relative %: 45.9 %
Platelets: 48 10*3/uL — ABNORMAL LOW (ref 140–400)
RBC: 3.2 10*6/uL — ABNORMAL LOW (ref 3.80–5.10)
RDW: 13.1 % (ref 11.0–15.0)
Total Lymphocyte: 46 %
WBC: 2 10*3/uL — ABNORMAL LOW (ref 3.8–10.8)

## 2020-10-13 LAB — AFP TUMOR MARKER: AFP-Tumor Marker: 2.1 ng/mL

## 2020-10-13 LAB — PROTIME-INR
INR: 1.1
Prothrombin Time: 11 s (ref 9.0–11.5)

## 2020-10-16 DIAGNOSIS — K7460 Unspecified cirrhosis of liver: Secondary | ICD-10-CM | POA: Diagnosis not present

## 2020-10-16 DIAGNOSIS — E119 Type 2 diabetes mellitus without complications: Secondary | ICD-10-CM | POA: Diagnosis not present

## 2020-10-16 DIAGNOSIS — Z7984 Long term (current) use of oral hypoglycemic drugs: Secondary | ICD-10-CM | POA: Diagnosis not present

## 2020-10-16 DIAGNOSIS — I10 Essential (primary) hypertension: Secondary | ICD-10-CM | POA: Diagnosis not present

## 2020-10-16 DIAGNOSIS — D509 Iron deficiency anemia, unspecified: Secondary | ICD-10-CM | POA: Diagnosis not present

## 2020-10-16 DIAGNOSIS — M1712 Unilateral primary osteoarthritis, left knee: Secondary | ICD-10-CM | POA: Diagnosis not present

## 2020-10-16 DIAGNOSIS — Z87891 Personal history of nicotine dependence: Secondary | ICD-10-CM | POA: Diagnosis not present

## 2020-10-17 ENCOUNTER — Ambulatory Visit (HOSPITAL_COMMUNITY)
Admission: RE | Admit: 2020-10-17 | Discharge: 2020-10-17 | Disposition: A | Discharge status: Home / Self Care | Payer: Medicare Other | Source: Ambulatory Visit | Attending: Gastroenterology | Admitting: Gastroenterology

## 2020-10-17 ENCOUNTER — Other Ambulatory Visit: Payer: Self-pay

## 2020-10-17 DIAGNOSIS — K7460 Unspecified cirrhosis of liver: Secondary | ICD-10-CM | POA: Diagnosis not present

## 2020-10-17 DIAGNOSIS — K7581 Nonalcoholic steatohepatitis (NASH): Secondary | ICD-10-CM | POA: Insufficient documentation

## 2020-10-23 ENCOUNTER — Ambulatory Visit (INDEPENDENT_AMBULATORY_CARE_PROVIDER_SITE_OTHER): Payer: Medicare Other | Admitting: Gastroenterology

## 2020-11-10 DIAGNOSIS — D696 Thrombocytopenia, unspecified: Secondary | ICD-10-CM | POA: Diagnosis not present

## 2020-11-10 DIAGNOSIS — D508 Other iron deficiency anemias: Secondary | ICD-10-CM | POA: Diagnosis not present

## 2020-11-10 DIAGNOSIS — D72819 Decreased white blood cell count, unspecified: Secondary | ICD-10-CM | POA: Diagnosis not present

## 2020-11-10 DIAGNOSIS — E538 Deficiency of other specified B group vitamins: Secondary | ICD-10-CM | POA: Diagnosis not present

## 2020-11-12 DIAGNOSIS — Z23 Encounter for immunization: Secondary | ICD-10-CM | POA: Diagnosis not present

## 2020-11-12 DIAGNOSIS — Z0000 Encounter for general adult medical examination without abnormal findings: Secondary | ICD-10-CM | POA: Diagnosis not present

## 2020-11-12 DIAGNOSIS — Z1331 Encounter for screening for depression: Secondary | ICD-10-CM | POA: Diagnosis not present

## 2020-11-12 DIAGNOSIS — Z1339 Encounter for screening examination for other mental health and behavioral disorders: Secondary | ICD-10-CM | POA: Diagnosis not present

## 2020-11-12 DIAGNOSIS — Z7189 Other specified counseling: Secondary | ICD-10-CM | POA: Diagnosis not present

## 2020-11-12 DIAGNOSIS — D509 Iron deficiency anemia, unspecified: Secondary | ICD-10-CM | POA: Diagnosis not present

## 2020-11-12 DIAGNOSIS — E7800 Pure hypercholesterolemia, unspecified: Secondary | ICD-10-CM | POA: Diagnosis not present

## 2020-11-12 DIAGNOSIS — E1142 Type 2 diabetes mellitus with diabetic polyneuropathy: Secondary | ICD-10-CM | POA: Diagnosis not present

## 2020-11-12 DIAGNOSIS — Z6824 Body mass index (BMI) 24.0-24.9, adult: Secondary | ICD-10-CM | POA: Diagnosis not present

## 2020-11-12 DIAGNOSIS — Z1211 Encounter for screening for malignant neoplasm of colon: Secondary | ICD-10-CM | POA: Diagnosis not present

## 2020-11-12 DIAGNOSIS — Z299 Encounter for prophylactic measures, unspecified: Secondary | ICD-10-CM | POA: Diagnosis not present

## 2020-11-24 DIAGNOSIS — E2839 Other primary ovarian failure: Secondary | ICD-10-CM | POA: Diagnosis not present

## 2020-12-10 DIAGNOSIS — D508 Other iron deficiency anemias: Secondary | ICD-10-CM | POA: Diagnosis not present

## 2020-12-15 DIAGNOSIS — J329 Chronic sinusitis, unspecified: Secondary | ICD-10-CM | POA: Diagnosis not present

## 2020-12-15 DIAGNOSIS — R0600 Dyspnea, unspecified: Secondary | ICD-10-CM | POA: Diagnosis not present

## 2020-12-16 ENCOUNTER — Encounter (INDEPENDENT_AMBULATORY_CARE_PROVIDER_SITE_OTHER): Payer: Self-pay | Admitting: *Deleted

## 2020-12-18 DIAGNOSIS — Z6823 Body mass index (BMI) 23.0-23.9, adult: Secondary | ICD-10-CM | POA: Diagnosis not present

## 2020-12-18 DIAGNOSIS — M1712 Unilateral primary osteoarthritis, left knee: Secondary | ICD-10-CM | POA: Diagnosis not present

## 2020-12-29 ENCOUNTER — Other Ambulatory Visit (INDEPENDENT_AMBULATORY_CARE_PROVIDER_SITE_OTHER): Payer: Self-pay | Admitting: Gastroenterology

## 2020-12-29 DIAGNOSIS — K7581 Nonalcoholic steatohepatitis (NASH): Secondary | ICD-10-CM

## 2020-12-29 DIAGNOSIS — I8510 Secondary esophageal varices without bleeding: Secondary | ICD-10-CM

## 2020-12-29 DIAGNOSIS — Z6824 Body mass index (BMI) 24.0-24.9, adult: Secondary | ICD-10-CM | POA: Diagnosis not present

## 2020-12-29 DIAGNOSIS — K7460 Unspecified cirrhosis of liver: Secondary | ICD-10-CM

## 2020-12-29 DIAGNOSIS — K317 Polyp of stomach and duodenum: Secondary | ICD-10-CM | POA: Diagnosis not present

## 2020-12-29 MED ORDER — CARVEDILOL 6.25 MG PO TABS
6.2500 mg | ORAL_TABLET | Freq: Two times a day (BID) | ORAL | 3 refills | Status: DC
Start: 2020-12-29 — End: 2021-04-29

## 2020-12-29 NOTE — Telephone Encounter (Signed)
Last seen 10/09/2020 for NASH by Dr.Castaneda .

## 2021-01-14 DIAGNOSIS — R0600 Dyspnea, unspecified: Secondary | ICD-10-CM | POA: Diagnosis not present

## 2021-01-14 DIAGNOSIS — J329 Chronic sinusitis, unspecified: Secondary | ICD-10-CM | POA: Diagnosis not present

## 2021-01-16 DIAGNOSIS — Z87891 Personal history of nicotine dependence: Secondary | ICD-10-CM | POA: Diagnosis not present

## 2021-01-16 DIAGNOSIS — I10 Essential (primary) hypertension: Secondary | ICD-10-CM | POA: Diagnosis not present

## 2021-01-16 DIAGNOSIS — J069 Acute upper respiratory infection, unspecified: Secondary | ICD-10-CM | POA: Diagnosis not present

## 2021-01-16 DIAGNOSIS — Z299 Encounter for prophylactic measures, unspecified: Secondary | ICD-10-CM | POA: Diagnosis not present

## 2021-01-16 DIAGNOSIS — J019 Acute sinusitis, unspecified: Secondary | ICD-10-CM | POA: Diagnosis not present

## 2021-02-05 DIAGNOSIS — D508 Other iron deficiency anemias: Secondary | ICD-10-CM | POA: Diagnosis not present

## 2021-02-05 DIAGNOSIS — I158 Other secondary hypertension: Secondary | ICD-10-CM | POA: Diagnosis not present

## 2021-02-05 DIAGNOSIS — K7469 Other cirrhosis of liver: Secondary | ICD-10-CM | POA: Diagnosis not present

## 2021-02-05 DIAGNOSIS — E538 Deficiency of other specified B group vitamins: Secondary | ICD-10-CM | POA: Diagnosis not present

## 2021-02-05 DIAGNOSIS — D72819 Decreased white blood cell count, unspecified: Secondary | ICD-10-CM | POA: Diagnosis not present

## 2021-02-05 DIAGNOSIS — D696 Thrombocytopenia, unspecified: Secondary | ICD-10-CM | POA: Diagnosis not present

## 2021-02-12 DIAGNOSIS — Z6824 Body mass index (BMI) 24.0-24.9, adult: Secondary | ICD-10-CM | POA: Diagnosis not present

## 2021-02-12 DIAGNOSIS — D508 Other iron deficiency anemias: Secondary | ICD-10-CM | POA: Diagnosis not present

## 2021-02-23 DIAGNOSIS — D509 Iron deficiency anemia, unspecified: Secondary | ICD-10-CM | POA: Diagnosis not present

## 2021-02-23 DIAGNOSIS — K7460 Unspecified cirrhosis of liver: Secondary | ICD-10-CM | POA: Diagnosis not present

## 2021-02-23 DIAGNOSIS — Z299 Encounter for prophylactic measures, unspecified: Secondary | ICD-10-CM | POA: Diagnosis not present

## 2021-02-23 DIAGNOSIS — Z87891 Personal history of nicotine dependence: Secondary | ICD-10-CM | POA: Diagnosis not present

## 2021-02-23 DIAGNOSIS — I10 Essential (primary) hypertension: Secondary | ICD-10-CM | POA: Diagnosis not present

## 2021-02-23 DIAGNOSIS — E1165 Type 2 diabetes mellitus with hyperglycemia: Secondary | ICD-10-CM | POA: Diagnosis not present

## 2021-02-23 DIAGNOSIS — Z6825 Body mass index (BMI) 25.0-25.9, adult: Secondary | ICD-10-CM | POA: Diagnosis not present

## 2021-02-27 DIAGNOSIS — D509 Iron deficiency anemia, unspecified: Secondary | ICD-10-CM | POA: Diagnosis not present

## 2021-04-01 DIAGNOSIS — H353131 Nonexudative age-related macular degeneration, bilateral, early dry stage: Secondary | ICD-10-CM | POA: Diagnosis not present

## 2021-04-01 DIAGNOSIS — H353132 Nonexudative age-related macular degeneration, bilateral, intermediate dry stage: Secondary | ICD-10-CM | POA: Diagnosis not present

## 2021-04-01 DIAGNOSIS — E113293 Type 2 diabetes mellitus with mild nonproliferative diabetic retinopathy without macular edema, bilateral: Secondary | ICD-10-CM | POA: Diagnosis not present

## 2021-04-02 DIAGNOSIS — D508 Other iron deficiency anemias: Secondary | ICD-10-CM | POA: Diagnosis not present

## 2021-04-02 DIAGNOSIS — G9332 Myalgic encephalomyelitis/chronic fatigue syndrome: Secondary | ICD-10-CM | POA: Diagnosis not present

## 2021-04-02 DIAGNOSIS — D708 Other neutropenia: Secondary | ICD-10-CM | POA: Diagnosis not present

## 2021-04-02 DIAGNOSIS — D696 Thrombocytopenia, unspecified: Secondary | ICD-10-CM | POA: Diagnosis not present

## 2021-04-02 DIAGNOSIS — D8989 Other specified disorders involving the immune mechanism, not elsewhere classified: Secondary | ICD-10-CM | POA: Diagnosis not present

## 2021-04-07 DIAGNOSIS — Z6824 Body mass index (BMI) 24.0-24.9, adult: Secondary | ICD-10-CM | POA: Diagnosis not present

## 2021-04-07 DIAGNOSIS — D500 Iron deficiency anemia secondary to blood loss (chronic): Secondary | ICD-10-CM | POA: Diagnosis not present

## 2021-04-08 ENCOUNTER — Encounter (INDEPENDENT_AMBULATORY_CARE_PROVIDER_SITE_OTHER): Payer: Self-pay | Admitting: *Deleted

## 2021-04-13 ENCOUNTER — Ambulatory Visit (INDEPENDENT_AMBULATORY_CARE_PROVIDER_SITE_OTHER): Payer: Medicare Other | Admitting: Gastroenterology

## 2021-04-13 ENCOUNTER — Other Ambulatory Visit: Payer: Self-pay

## 2021-04-13 ENCOUNTER — Encounter (INDEPENDENT_AMBULATORY_CARE_PROVIDER_SITE_OTHER): Payer: Self-pay | Admitting: Gastroenterology

## 2021-04-13 VITALS — BP 139/63 | HR 76 | Temp 98.0°F | Ht 63.0 in | Wt 135.5 lb

## 2021-04-13 DIAGNOSIS — I8510 Secondary esophageal varices without bleeding: Secondary | ICD-10-CM | POA: Diagnosis not present

## 2021-04-13 DIAGNOSIS — R197 Diarrhea, unspecified: Secondary | ICD-10-CM | POA: Diagnosis not present

## 2021-04-13 DIAGNOSIS — R1030 Lower abdominal pain, unspecified: Secondary | ICD-10-CM | POA: Diagnosis not present

## 2021-04-13 DIAGNOSIS — R109 Unspecified abdominal pain: Secondary | ICD-10-CM | POA: Insufficient documentation

## 2021-04-13 DIAGNOSIS — K317 Polyp of stomach and duodenum: Secondary | ICD-10-CM

## 2021-04-13 DIAGNOSIS — K7581 Nonalcoholic steatohepatitis (NASH): Secondary | ICD-10-CM | POA: Diagnosis not present

## 2021-04-13 DIAGNOSIS — K7460 Unspecified cirrhosis of liver: Secondary | ICD-10-CM | POA: Diagnosis not present

## 2021-04-13 MED ORDER — DICYCLOMINE HCL 10 MG PO CAPS: 10.0000 mg | ORAL_CAPSULE | Freq: Two times a day (BID) | ORAL | 2 refills | Status: DC | PRN

## 2021-04-13 NOTE — Patient Instructions (Addendum)
-   Start Bentyl 1 tablet q12h as needed for abdominal pain - If persistent diarrhea can take Imodium as needed - Perform blood workup - Perform stool workup - Schedule liver US - Schedule CT angio abdomen and pelvis with IV contrast - Carvedilol 6.25 mg every 12 hours - Continue diuretics - Reduce salt intake to <2 g per day - Can take Tylenol max of 2 g per day (650 mg q8h) for pain - Avoid NSAIDs for pain - Avoid eating raw oysters/shellfish - Ensure every night before going to sleep - RTC 6 months

## 2021-04-13 NOTE — Progress Notes (Signed)
Jeanette Chang, M.D. 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  Patient reports that she has presented recurrent episodes of bloating after having a meal, as well as having pain described as cramping, which is followed by an episode of explosive diarrhea. States these symptoms started after Thanksgiving, did not have these symptoms before. She denies melena or hematochezia. States that she has the pain after eating, which is followed by severe cramping. She is fearful of eating and has been avoiding eating frequently and full meals due to this. When she has a bowel movement, she has multiple watery bowel movements. Has episodes of fecal soiling occasionally. When she takes Imodium she can have significant constipation afterwards.  Her daughter reported she has lost significant amount of weight that she has avoided eating.Sometimes she has some episodes of pain when sleeping and has to go to the restroom to have a BM.  Sometimes she feels nauseated but does not vomit. The patient denies having any vomiting, fever, chills, hematochezia, melena, hematemesis, diarrhea, jaundice, pruritus.  Patient was started on Coreg after most recent EGD and is taking this medicine at a dose of 6.25 mg twice a day.  She has been having corticosteroid shots for her knees as she was not considered a surgical candidate for knee replacement given her cirrhosis and CBC abnormalities. Has also been using Tylenol for control of her pain.  Notably, she  was referred to [facility] for evaluation of gastric polyp per request of hematology. Evaluated by Dr. Chales Abrahams, who considered that given hemoglobin stability and high risk of complications with polyp size and severe thrombocytopenia, no need for endoscopic resection was warranted for gastric polyp. Most recent Hb on 04/02/21 was 11.5.  Cirrhosis related questions: Hematemesis/coffee ground emesis: No History of variceal bleeding: No Abdominal pain: No Abdominal distention/worsening ascitesNo Fever/chills: No Episodes of confusion/disorientation: No Taking diuretics?: yes, furosemide 20 mg qday Date of last EGD:06/17/20 Prior history of banding?: no Prior episodes of SBP: no Last time liver imaging was performed:US liver 10/17/2020 - Appearance of the liver in keeping with history of cirrhosis. No new focal liver lesion identified. MELD score:02/05/2021 - 8  Last EGD:06/17/2020 Grade II varices were found in the lower third of the esophagus. An area of irregularity was found in the posterior rim of the pyloric channed. This was visualized with the aid of a transparent cap. I pulled the area with a cold forceps and was able to see a medium-sized - 1 cm, frond-like/villous, non-circumferential mass with no bleeding and no stigmata of recent bleeding was found at the pylorus. It had some adenomatous features - unclear if coming from pylorus or from duodenal bulb. One biopsy was taken with a cold forceps for histology, no more biopsies were performed as patient presented significant bleeding that stopped on its own. The examined duodenum and ampulla were normal.  Path: Granulation tissue neg for malignancy or HP.  Last Colonoscopy:06/2019 - two small cecal and SF polyps, one cecal 6 mm polyp. All polyps were TA. Hemorrhoids  Past Medical History: Past Medical History:  Diagnosis Date  Abnormal LFTs    Achilles bursitis or tendinitis    Acute maxillary sinusitis    Acute pharyngitis     Anemia, unspecified    Candidiasis of skin and nails    Dermatophytosis of foot    Dermatophytosis of the body    Diverticulitis of colon (without mention of hemorrhage)(562.11)    Edema    Headache(784.0)    HOH (hard of hearing)    Malaise and fatigue    Occlusion and stenosis of carotid artery without mention of cerebral infarction    Osteoarthrosis, unspecified whether generalized or localized, unspecified site    Other dyspnea and respiratory abnormality    Other psoriasis    Other specified disorders of rotator cuff syndrome of shoulder and allied disorders    Pain in joint    Pure hypercholesterolemia    RUQ pain    Spasm of back muscles    Thrombocytopenia, unspecified ([place])    Type II or unspecified type diabetes mellitus without mention of complication, not stated as uncontrolled    Unspecified essential hypertension    Unspecified sinusitis (chronic)    Urinary incontinence     Past Surgical History: Past Surgical History:  Procedure Laterality Date   APPENDECTOMY  1964   BIOPSY  03/02/2017   Procedure: BIOPSY;  Surgeon: Rogene Houston, MD;  [facility];  Service: Endoscopy;;  antral   BIOPSY  06/17/2020   Procedure: BIOPSY;  Surgeon: Harvel Quale, MD;  [facility];  Service: Gastroenterology;;   CAROTID ENDARTERECTOMY  2005   Left CEA   CAROTID ENDARTERECTOMY  2009   Right CEA   CATARACT EXTRACTION, BILATERAL Bilateral    CHOLECYSTECTOMY  1982   Gall Bladder   COLONOSCOPY  09   COLONOSCOPY N/A 06/27/2019   Procedure: COLONOSCOPY;  Surgeon: Rogene Houston, MD;  [facility];  Service: Endoscopy;  Laterality: N/A;   ESOPHAGOGASTRODUODENOSCOPY N/A 03/02/2017   Procedure: ESOPHAGOGASTRODUODENOSCOPY (EGD);  Surgeon: Rogene Houston, MD;  [facility];  Service: Endoscopy;  Laterality: N/A;  12:55   ESOPHAGOGASTRODUODENOSCOPY N/A 06/27/2019   Procedure: ESOPHAGOGASTRODUODENOSCOPY (EGD);  Surgeon: Rogene Houston, MD;  [facility];  Service: Endoscopy;  Laterality: N/A;  730   ESOPHAGOGASTRODUODENOSCOPY (EGD) WITH PROPOFOL N/A 06/17/2020   Procedure: ESOPHAGOGASTRODUODENOSCOPY (EGD) WITH PROPOFOL;  Surgeon: Harvel Quale, MD;  [facility];  Service: Gastroenterology;  Laterality: N/A;  AM   GIVENS CAPSULE STUDY N/A 10/24/2019   Procedure: GIVENS CAPSULE STUDY;  Surgeon: Rogene Houston, MD;  [facility];  Service: Endoscopy;  Laterality: N/A;  730   KNEE ARTHROSCOPY WITH MEDIAL MENISECTOMY Left 07/10/2019   Procedure: KNEE ARTHROSCOPY WITH MEDIAL MENISCECTOMY;  Surgeon: Carole Civil, MD;  Location: [facility];  Service: Orthopedics;  Laterality: Left;   KNEE ARTHROSCOPY WITH MEDIAL MENISECTOMY Left 03/14/2020   Procedure: KNEE ARTHROSCOPY WITH MEDIAL MENISCECTOMY;  Surgeon: Carole Civil, MD;  Location: [facility];  Service: Orthopedics;  Laterality: Left;   NOSE SURGERY  1978   PARATHYROIDECTOMY  1992   POLYPECTOMY  03/02/2017   Procedure: POLYPECTOMY;  Surgeon: Rogene Houston, MD;  [facility];  Service: Endoscopy;;  gastric    POLYPECTOMY  06/27/2019   Procedure: POLYPECTOMY;  Surgeon: Rogene Houston, MD;  [facility];  Service: Endoscopy;;  colon   Power port  03/06/2009   Right upper chest    TONSILLECTOMY  TUMOR EXCISION Right August 24, 2013   [facility] Dr. Ellen Henri, ENT   VAGINAL HYSTERECTOMY  1972    Family History: Family History  Problem Relation Age of Onset   Heart attack Mother    Cancer Mother    Heart attack Father    Cancer Sister     Social History: Social History   Tobacco Use  Smoking Status Former   Packs/day: 1.00   Years: 42.00   Pack years: 42.00   Types: Cigarettes   Quit date: 03/12/1992   Years since quitting: 29.1  Smokeless Tobacco Former   Quit date: 12/16/1992   Social History   Substance and Sexual Activity  Alcohol Use Yes   Alcohol/week:  1.0 standard drink   Types: 1 Glasses of wine per week   Comment: very seldom   Social History   Substance and Sexual Activity  Drug Use No    Allergies: Allergies  Allergen Reactions   Codeine Nausea And Vomiting and Rash   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   Toviaz [Fesoterodine Fumarate] Other (See Comments)    Cramps, severe headache   Norco [Hydrocodone-Acetaminophen] Nausea And Vomiting and Anxiety   Penicillins Swelling, Rash and Other (See Comments)    Has patient had a PCN reaction causing immediate rash, facial/tongue/throat swelling, SOB or lightheadedness with hypotension: No Has patient had a PCN reaction causing severe rash involving mucus membranes or skin necrosis: No Has patient had a PCN reaction that required hospitalization: Yes Has patient had a PCN reaction occurring within the last 10 years: No If all of the above answers are "NO", then may proceed with Cephalosporin use.    Ultram [Tramadol Hcl] Nausea And Vomiting    Medications: Current Outpatient Medications  Medication Sig Dispense Refill   acetaminophen (TYLENOL) 500 MG tablet Take 1 tablet (500 mg total) by mouth every 6 (six) hours as needed for moderate pain or headache. 30 tablet 0   aspirin EC 81 MG tablet Take 81 mg by mouth every other day. AT NIGHT.     Biotin 10 MG CAPS Take by mouth daily at 6 (six) AM.      carvedilol (COREG) 6.25 MG tablet Take 1 tablet (6.25 mg total) by mouth 2 (two) times daily with a meal. 180 tablet 3   cholecalciferol (VITAMIN D) 25 MCG (1000 UNIT) tablet Take 1,000 Units by mouth every evening.     cyanocobalamin (,VITAMIN B-12,) 1000 MCG/ML injection Inject 1,000 mcg into the muscle every 30 (thirty) days.     dapagliflozin propanediol (FARXIGA) 5 MG TABS tablet Take 5 mg by mouth daily.     furosemide (LASIX) 20 MG tablet Take 20 mg by mouth daily. IN THE MORNING     gabapentin (NEURONTIN) 300 MG capsule Take 300 mg by mouth at bedtime.  glimepiride (AMARYL) 2 MG tablet Take 2 mg by mouth daily.     lisinopril (PRINIVIL,ZESTRIL) 40 MG tablet Take 40 mg by mouth daily.     metFORMIN (GLUCOPHAGE) 500 MG tablet Take 1,000 mg by mouth 2 (two) times daily with a meal.     OVER THE COUNTER MEDICATION Iron 25 mg daily.     vitamin B-12 (CYANOCOBALAMIN) 1000 MCG tablet Take 1,000 mcg by mouth every evening.     ZINC OXIDE EX Apply 1 application topically 3 (three) times daily as needed (psoriasis flares).      No current facility-administered medications for this visit.   Facility-Administered Medications Ordered in Other Visits  Medication Dose Route Frequency Provider Last Rate Last Admin   sodium chloride irrigation 0.9 %    PRN Carole Civil, MD   1,000 mL at 03/14/20 0945    Review of Systems: GENERAL: negative for malaise, night sweats HEENT: No changes in hearing or vision, no nose bleeds or other nasal problems. NECK: Negative for lumps, goiter, pain and significant neck swelling RESPIRATORY: Negative for cough, wheezing CARDIOVASCULAR: Negative for chest pain, leg swelling, palpitations, orthopnea GI: SEE HPI MUSCULOSKELETAL: Negative for joint pain or swelling, back pain, and muscle pain. SKIN: Negative for lesions, rash PSYCH: Negative for sleep disturbance, mood disorder and recent psychosocial stressors. HEMATOLOGY Negative for prolonged bleeding,  bruising easily, and swollen nodes. ENDOCRINE: Negative for cold or heat intolerance, polyuria, polydipsia and goiter. NEURO: negative for tremor, gait imbalance, syncope and seizures. The remainder of the review of systems is noncontributory.   Physical Exam: BP 139/63 (BP Location: Left Arm, Patient Position: Sitting, Cuff Size: Small)    Pulse 76    Temp 98 F (36.7 C) (Oral)    Ht 5' 3"  (1.6 m)    Wt 135 lb 8 oz (61.5 kg)    BMI 24.00 kg/m  GENERAL: The patient is AO x3, in no acute distress. HEENT: Head is normocephalic and atraumatic. EOMI are intact. Mouth is well hydrated and without lesions. NECK: Supple. No masses LUNGS: Clear to auscultation. No presence of rhonchi/wheezing/rales. Adequate chest expansion HEART: RRR, normal s1 and s2. ABDOMEN: Soft, nontender, no guarding, no peritoneal signs, and nondistended. BS +. No masses. EXTREMITIES: Without any cyanosis, clubbing, rash, lesions or edema. NEUROLOGIC: AOx3, no focal motor deficit. SKIN: no jaundice, no rashes   Imaging/Labs: as above  I personally reviewed and interpreted the available labs, imaging and endoscopic files.  Impression and Plan: Justyne Roell is a 81 y.o. 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  She has presented relatively compensated cirrhosis with presence of nonbleeding esophageal varices that are currently being managed with beta-blockers which she has been tolerating adequately.  We will continue at the same dose of carvedilol for now but may consider increasing the dose to 12.5 mg in the future if her heart rate tolerated.  We will update her liver ultrasound and AFP testing for [facility] screening.  She had recent MELD labs with score of 8.  In terms of her anemia, this has remained stable and based on the evaluation by [facility] advanced endoscopist, there is no need to proceed with  polypectomy given her high risk of bleeding which I find reasonable.  She is following with hematology at [facility].  Finally, she has presented recurrent episode of diarrhea and abdominal pain after meals.  Given her age and comorbidities will  need to evaluate the presence of chronic mesenteric ischemia with a CT angio of the abdomen and pelvis.  We will rule out C. difficile infection and other GI pathogens in stool.  She can take Imodium as needed for diarrhea and she can take Bentyl as needed for abdominal pain episodes.  - Start Bentyl 1 tablet q12h as needed for abdominal pain - If persistent diarrhea can take Imodium as needed - AFP -Check C. difficile and GI pathogen - Schedule liver US - Schedule CT angio abdomen and pelvis with IV contrast - Carvedilol 6.25 mg every 12 hours - Continue diuretics - Reduce salt intake to <2 g per day - Can take Tylenol max of 2 g per day (650 mg q8h) for pain - Avoid NSAIDs for pain - Avoid eating raw oysters/shellfish - Ensure every night before going to sleep - RTC 6 months  All questions were answered.      Jeanette Peppers, MD Gastroenterology and Hepatology [facility]

## 2021-04-15 ENCOUNTER — Telehealth (INDEPENDENT_AMBULATORY_CARE_PROVIDER_SITE_OTHER): Payer: Self-pay

## 2021-04-15 NOTE — Telephone Encounter (Signed)
Dicyclomine 10 mg approved per [employer] from 02/15/21-04/15/22 ?

## 2021-04-17 DIAGNOSIS — K7581 Nonalcoholic steatohepatitis (NASH): Secondary | ICD-10-CM | POA: Diagnosis not present

## 2021-04-17 DIAGNOSIS — R197 Diarrhea, unspecified: Secondary | ICD-10-CM | POA: Diagnosis not present

## 2021-04-17 DIAGNOSIS — K7460 Unspecified cirrhosis of liver: Secondary | ICD-10-CM | POA: Diagnosis not present

## 2021-04-20 LAB — GASTROINTESTINAL PATHOGEN PANEL PCR
C. difficile Tox A/B, PCR: NOT DETECTED
Campylobacter, PCR: NOT DETECTED
Cryptosporidium, PCR: NOT DETECTED
E coli (ETEC) LT/ST PCR: NOT DETECTED
E coli (STEC) stx1/stx2, PCR: NOT DETECTED
E coli 0157, PCR: NOT DETECTED
Giardia lamblia, PCR: NOT DETECTED
Norovirus, PCR: NOT DETECTED
Rotavirus A, PCR: NOT DETECTED
Salmonella, PCR: NOT DETECTED
Shigella, PCR: NOT DETECTED

## 2021-04-20 LAB — C. DIFFICILE GDH AND TOXIN A/B
GDH ANTIGEN: NOT DETECTED
MICRO NUMBER:: 13087058
SPECIMEN QUALITY:: ADEQUATE
TOXIN A AND B: NOT DETECTED

## 2021-04-20 LAB — AFP TUMOR MARKER: AFP-Tumor Marker: 2.2 ng/mL

## 2021-04-21 ENCOUNTER — Ambulatory Visit (HOSPITAL_COMMUNITY)
Admission: RE | Admit: 2021-04-21 | Discharge: 2021-04-21 | Disposition: A | Discharge status: Home / Self Care | Payer: Medicare Other | Source: Ambulatory Visit | Attending: Gastroenterology | Admitting: Gastroenterology

## 2021-04-21 ENCOUNTER — Other Ambulatory Visit: Payer: Self-pay

## 2021-04-21 DIAGNOSIS — K7460 Unspecified cirrhosis of liver: Secondary | ICD-10-CM | POA: Insufficient documentation

## 2021-04-21 DIAGNOSIS — K7581 Nonalcoholic steatohepatitis (NASH): Secondary | ICD-10-CM | POA: Diagnosis not present

## 2021-04-21 DIAGNOSIS — Z9049 Acquired absence of other specified parts of digestive tract: Secondary | ICD-10-CM | POA: Diagnosis not present

## 2021-04-21 IMAGING — US US ABDOMEN LIMITED
1 series · 14 of 25 positions shown · non-contrast
Comparison: Abdominal ultrasound [DATE]

CLINICAL DATA: Cirrhosis.

EXAM:
ULTRASOUND ABDOMEN LIMITED RIGHT UPPER QUADRANT

[Series 1: us abdomen limited ruq (liver/gb) · 14 of 50 slices shown]
[im 1/50]
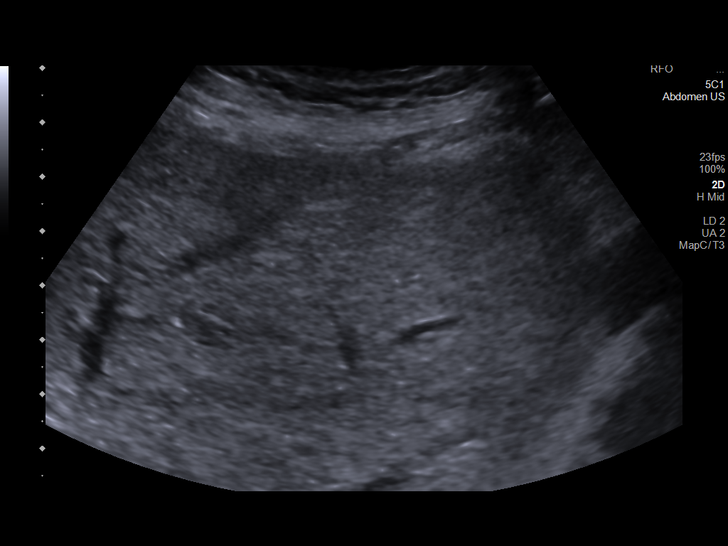
[im 5/50]
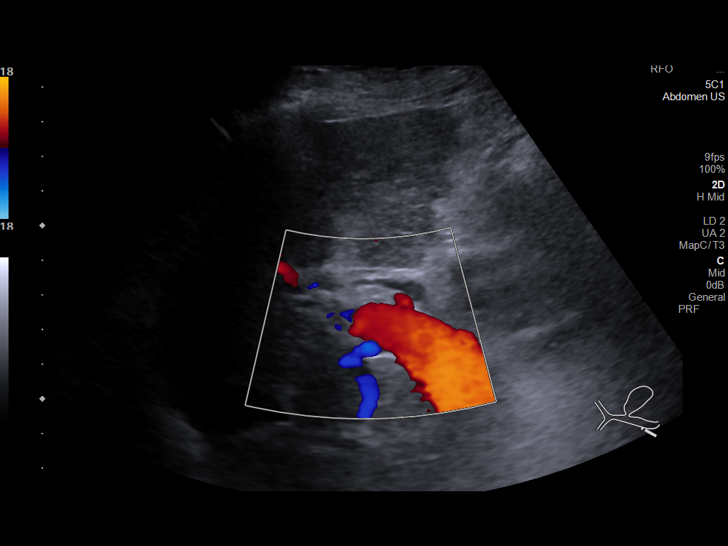
[im 9/50]
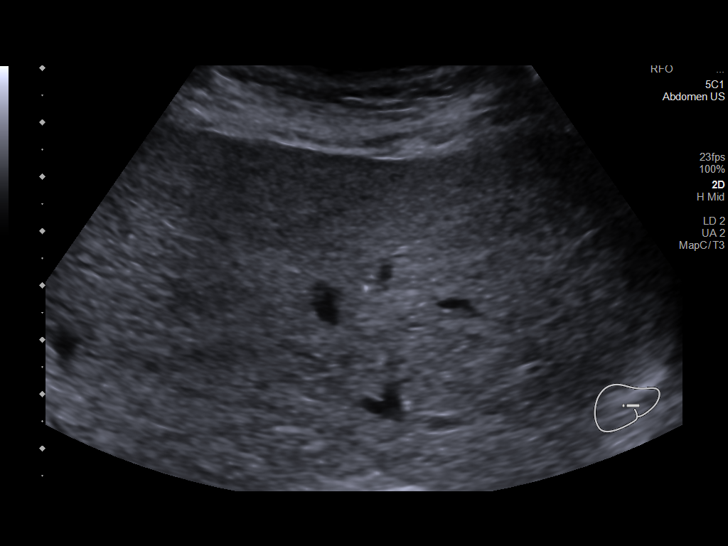
[im 13/50]
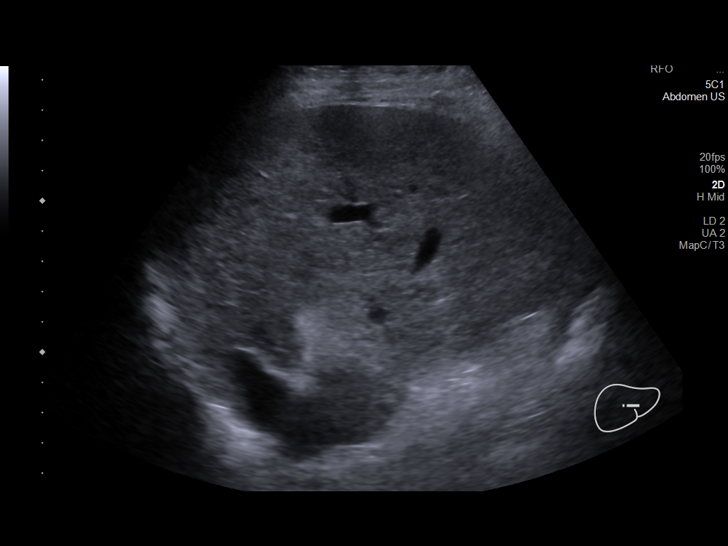
[im 17/50]
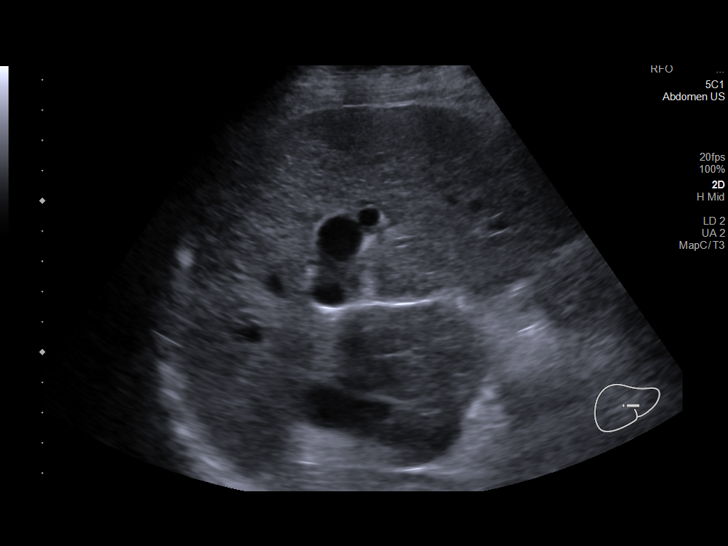
[im 19/50]
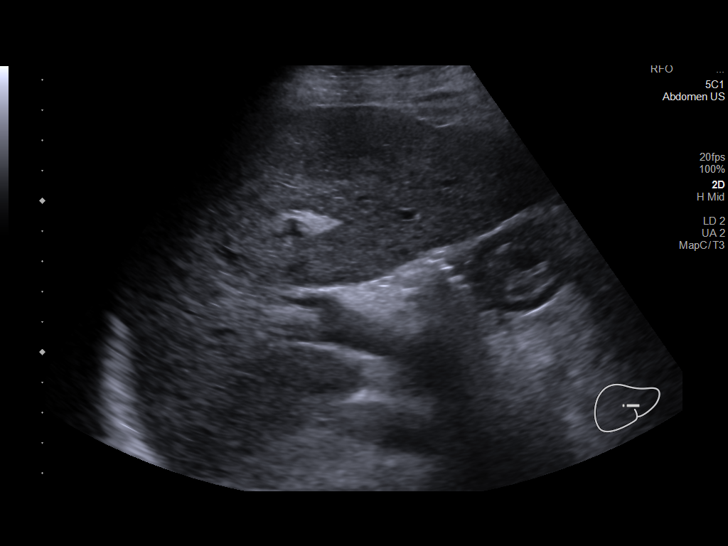
[im 23/50]
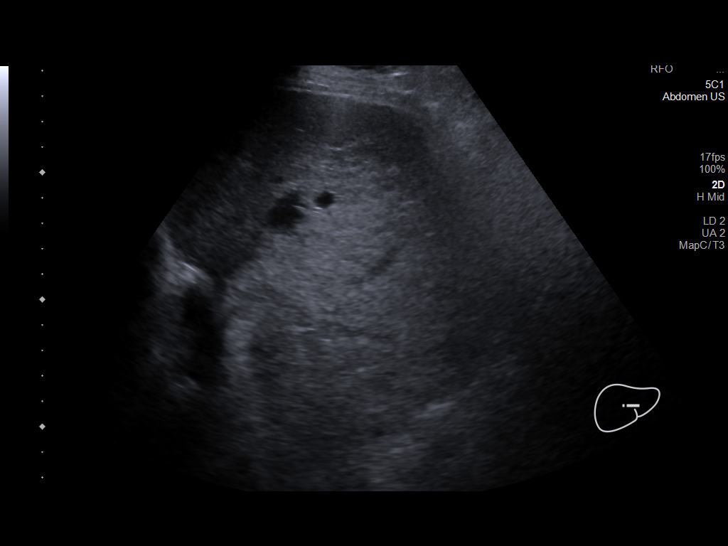
[im 27/50]
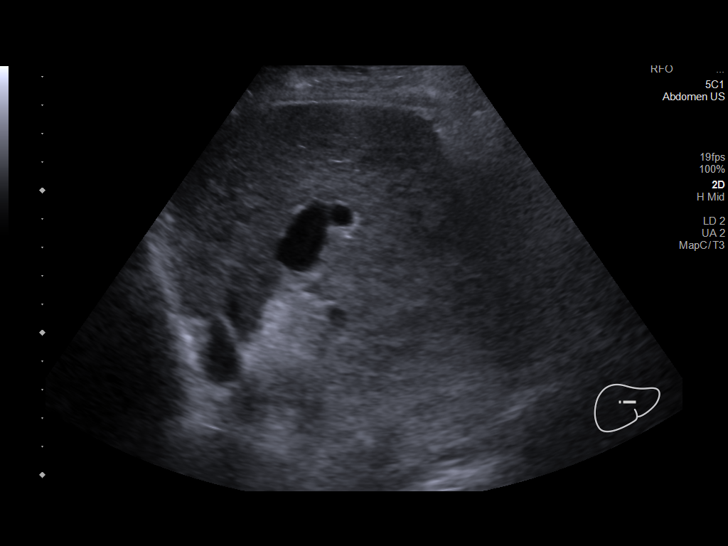
[im 31/50]
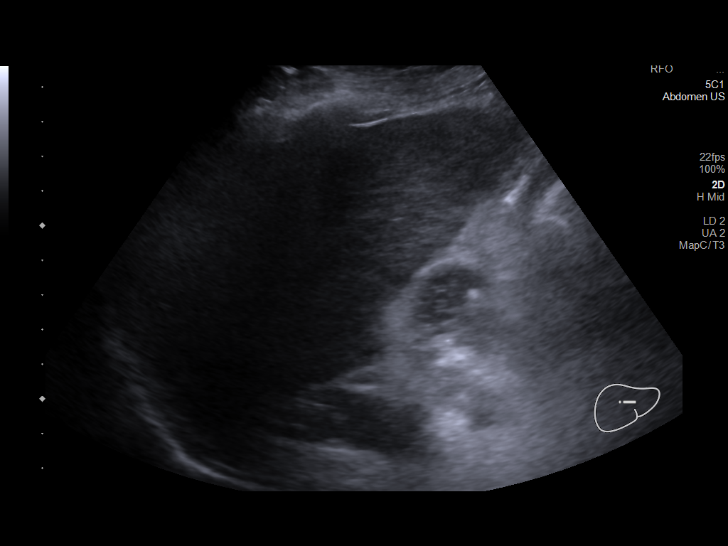
[im 33/50]
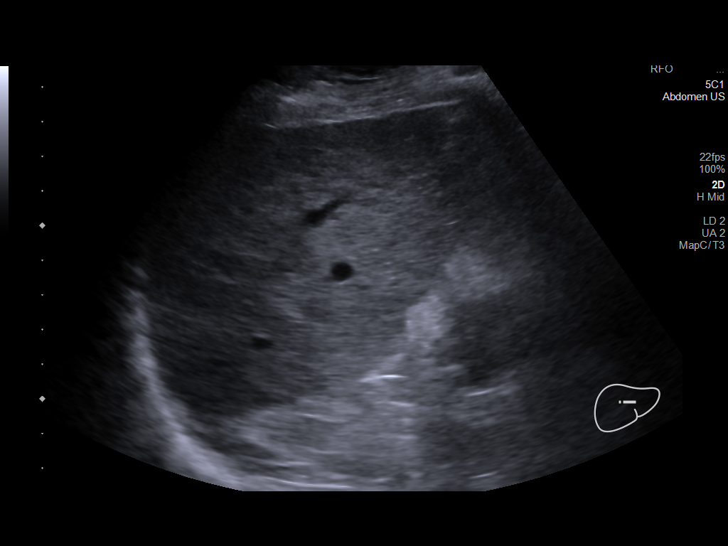
[im 37/50]
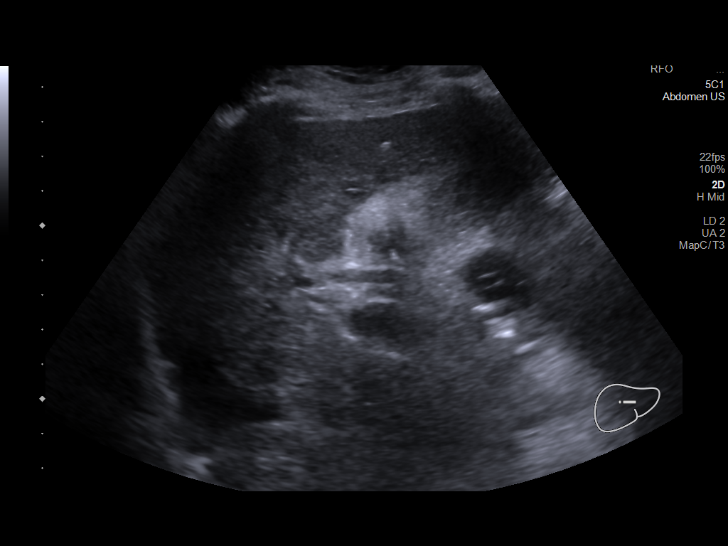
[im 41/50]
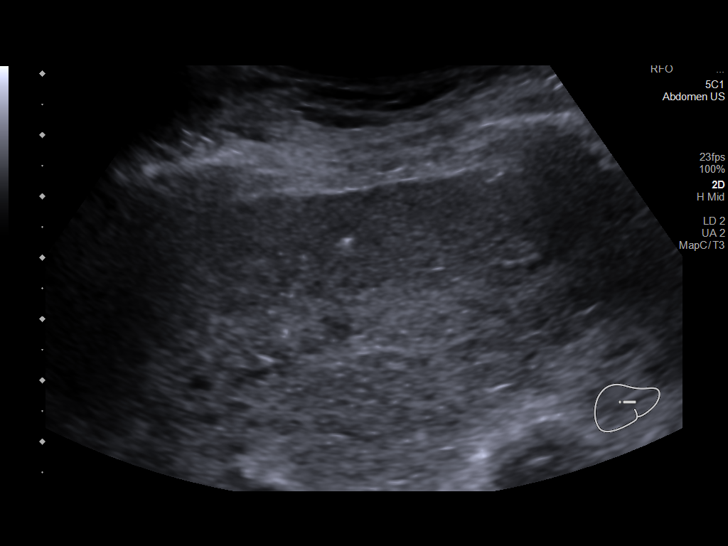
[im 45/50]
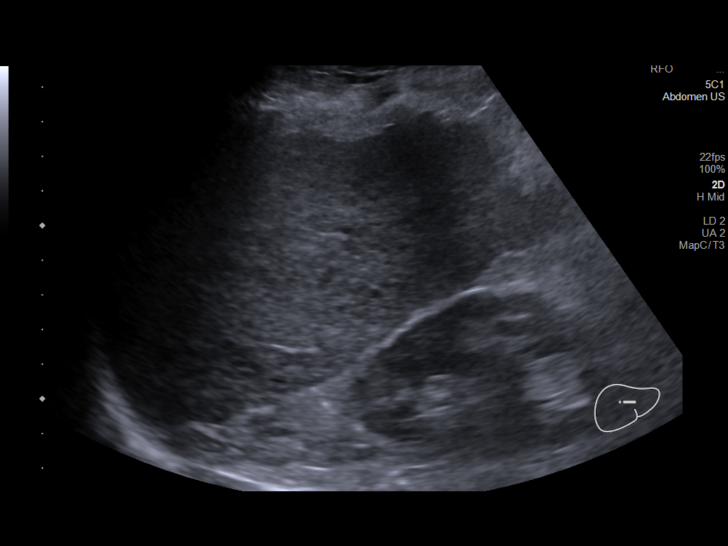
[im 50/50]
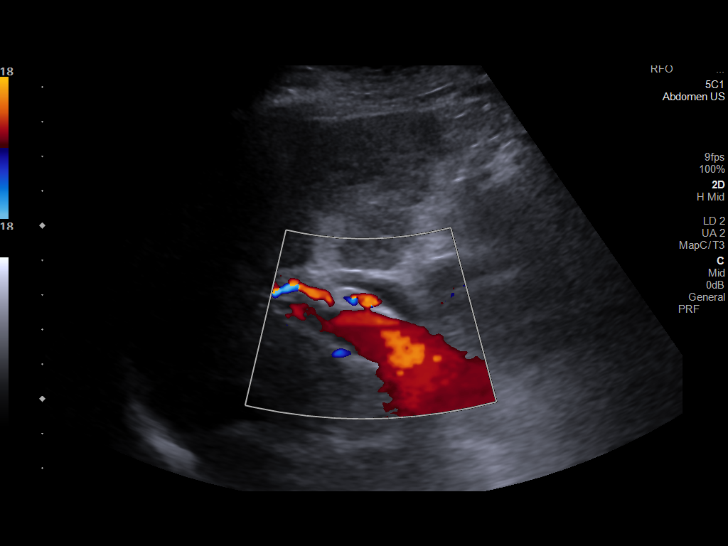

[14 of 25 positions shown; findings below may reference images not displayed]

FINDINGS: Gallbladder:

Surgically absent.

Common bile duct:

Diameter: 6.6 mm

Liver:

No focal lesion identified. Nodular liver contour. Increased
echogenicity. 3 mm calcification identified. Portal vein is patent
on color Doppler imaging with normal direction of blood flow towards
the liver.

Other: None.
IMPRESSION: 1. Findings consistent with hepatic cirrhosis.
2. Cholecystectomy.

## 2021-04-24 ENCOUNTER — Ambulatory Visit (HOSPITAL_COMMUNITY)
Admission: RE | Admit: 2021-04-24 | Discharge: 2021-04-24 | Disposition: A | Discharge status: Home / Self Care | Payer: Medicare Other | Source: Ambulatory Visit | Attending: Gastroenterology | Admitting: Gastroenterology

## 2021-04-24 ENCOUNTER — Other Ambulatory Visit: Payer: Self-pay

## 2021-04-24 DIAGNOSIS — R1030 Lower abdominal pain, unspecified: Secondary | ICD-10-CM | POA: Insufficient documentation

## 2021-04-24 DIAGNOSIS — I700 Atherosclerosis of aorta: Secondary | ICD-10-CM | POA: Diagnosis not present

## 2021-04-24 DIAGNOSIS — R140 Abdominal distension (gaseous): Secondary | ICD-10-CM | POA: Diagnosis not present

## 2021-04-24 DIAGNOSIS — R188 Other ascites: Secondary | ICD-10-CM | POA: Diagnosis not present

## 2021-04-24 LAB — POCT I-STAT CREATININE: Creatinine, Ser: 1 mg/dL (ref 0.44–1.00)

## 2021-04-24 IMAGING — CT CT CTA ABD/PEL W/CM AND/OR W/O CM
2 of 9 series · 8 of 46 positions shown, 14 images · IV contrast (Omnipaque or Isovue)
Comparison: [DATE]

CLINICAL DATA: Lower abdominal pain. Bloating after eating.
Evaluate for chronic mesenteric ischemia.

EXAM:
CTA ABDOMEN AND PELVIS WITHOUT AND WITH CONTRAST
TECHNIQUE: Multidetector CT imaging of the abdomen and pelvis was performed
using the standard protocol during bolus administration of
intravenous contrast. Multiplanar reconstructed images and MIPs were
obtained and reviewed to evaluate the vascular anatomy.

[Series 11: portal venous · axial · portal-venous · 0.84mm/px · z∈[+1011,+1336]mm · 6 of 91 slices shown, 11 images]
[im 13/91  soft-tissue]
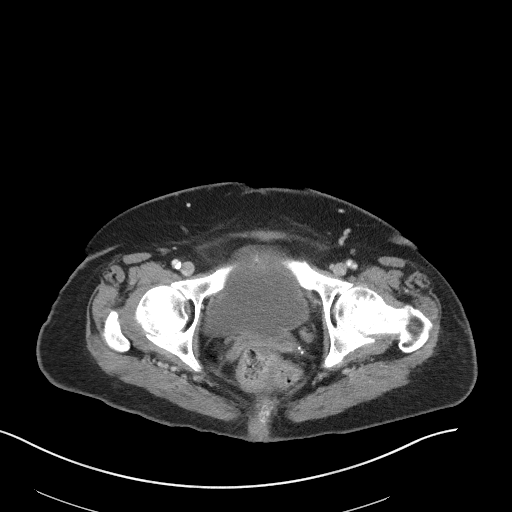
[im 13/91  bone]
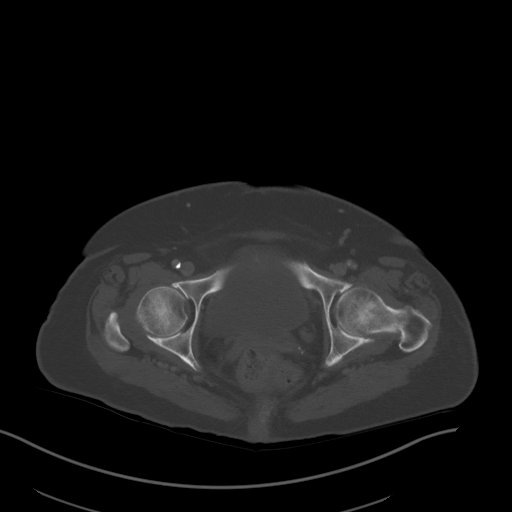
[im 26/91  soft-tissue]
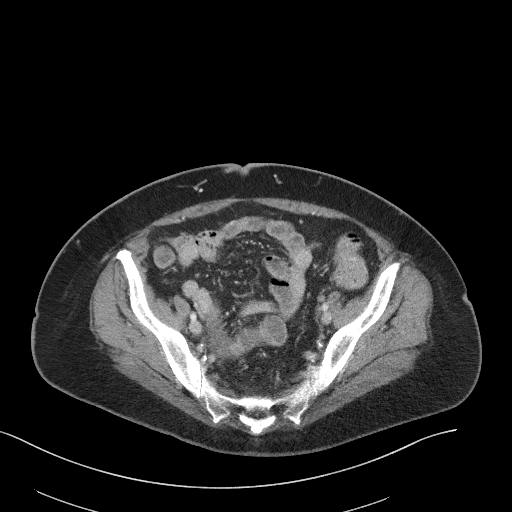
[im 39/91  soft-tissue]
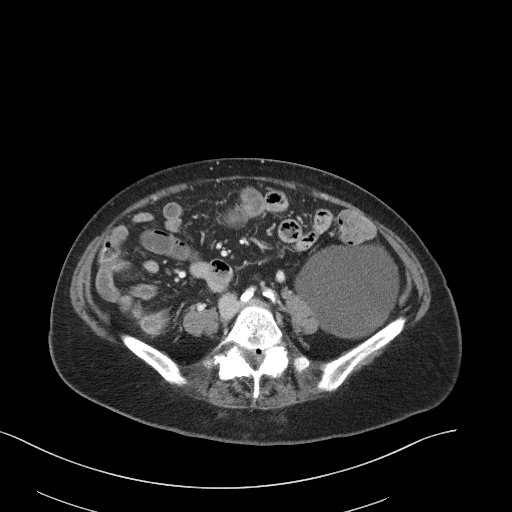
[im 39/91  lung]
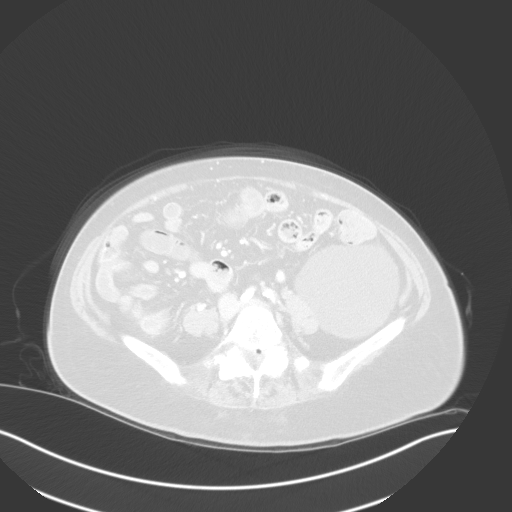
[im 52/91  soft-tissue]
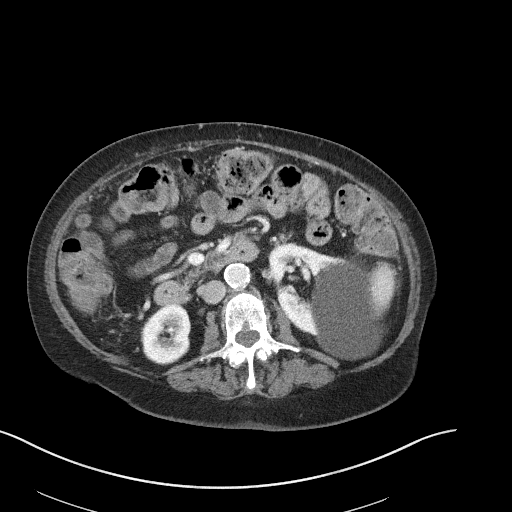
[im 52/91  lung]
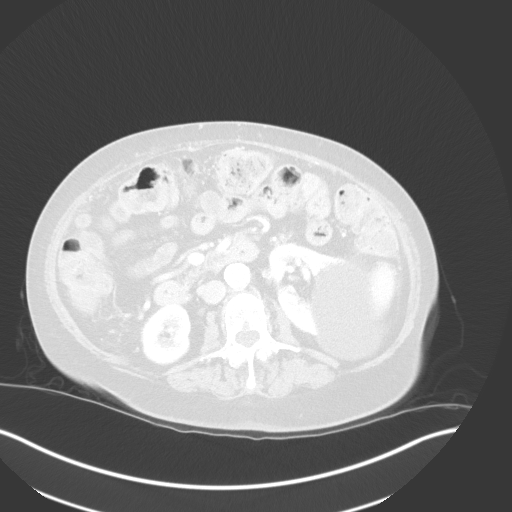
[im 65/91  soft-tissue]
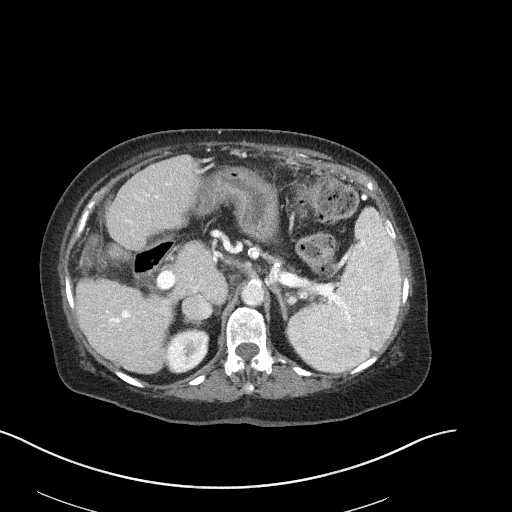
[im 65/91  lung]
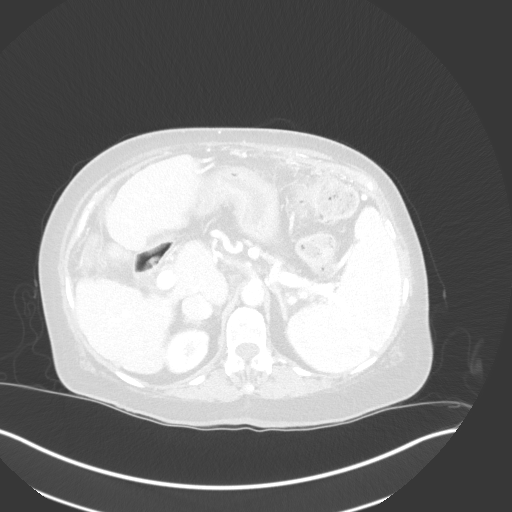
[im 78/91  soft-tissue]
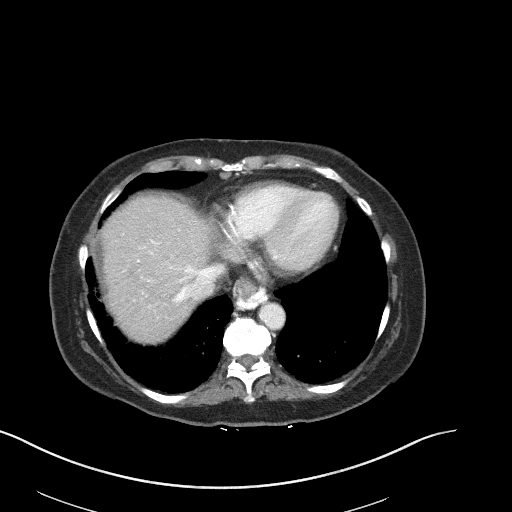
[im 78/91  lung]
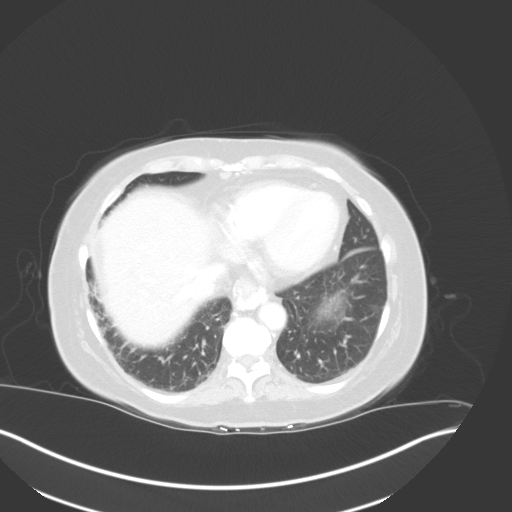

[Series 13: cor ven · coronal · 0.79mm/px · 2 of 160 slices shown, 3 images]
[im 54/160  soft-tissue]
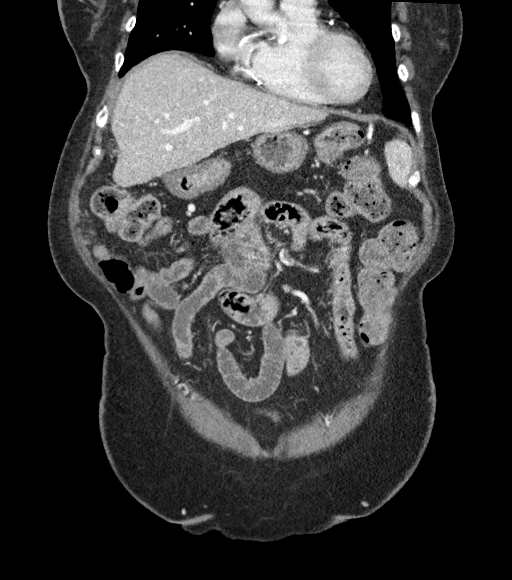
[im 54/160  bone]
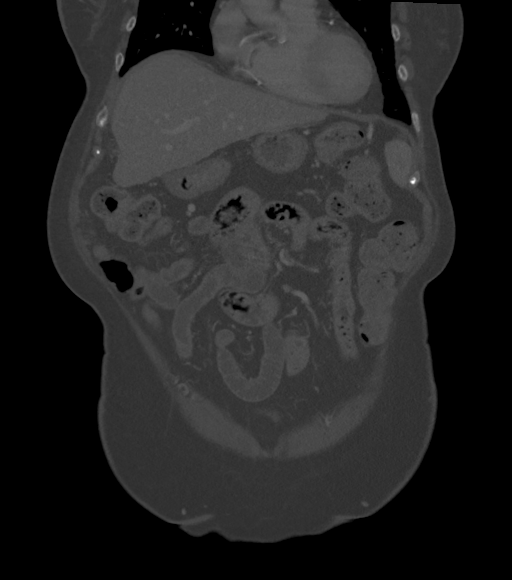
[im 107/160  soft-tissue]
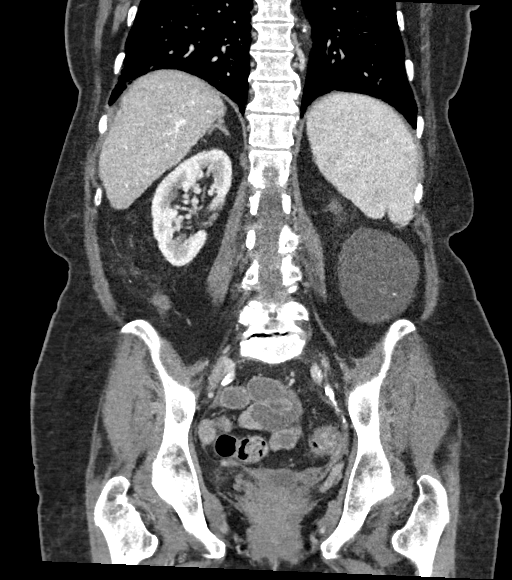

[8 of 46 positions shown; findings below may reference images not displayed]

RADIATION DOSE REDUCTION: This exam was performed according to the
departmental dose-optimization program which includes automated
exposure control, adjustment of the mA and/or kV according to
patient size and/or use of iterative reconstruction technique.

CONTRAST:  100mL OMNIPAQUE IOHEXOL 350 MG/ML SOLN
FINDINGS: VASCULAR

Aorta: Diffuse atherosclerotic disease in the abdominal aorta.
Overall, the abdominal aorta is small in caliber only measuring
cm just above the IMA. Focal ectasia of the infrarenal abdominal
aorta measuring 2.0 cm. No evidence for an aortic dissection.
Coronary artery calcifications.

Celiac: Celiac trunk is patent with minimal narrowing at the origin.
Main branches of the celiac trunk are patent. Splenic artery is
heavily calcified without aneurysm.

SMA: Mild narrowing at the origin of the SMA. Main branch vessels
are patent.

Renals: 2 right-sided renal arteries and 2 left-sided renal
arteries. Stenosis at the origin of the renal arteries related to
the aortic calcified plaque. Difficult to evaluate the degree of
stenosis. No evidence for aneurysm or dissections involving the
renal arteries.

IMA: Patent without evidence of aneurysm, dissection, vasculitis or
significant stenosis.

Inflow: Calcified plaque involving the bilateral iliac arteries.
Common and external iliac arteries are patent. Difficult to exclude
mild stenosis in the proximal right common iliac artery. Calcified
plaque and stenosis near the origin of the internal iliac arteries
bilaterally.

Proximal Outflow: Proximal femoral arteries are patent bilaterally.

Veins: Portal venous system including the splenic vein are patent.
Main portal vein is prominent for size measuring up to 1.6 cm.
Enlarged left gastric vein associated with prominent esophageal
varices. No significant gastric varices. Renal veins are patent. No
gross abnormality to the IVC or iliac veins.

Review of the MIP images confirms the above findings.

NON-VASCULAR

Lower chest: Lung bases are clear without pleural effusions.

Hepatobiliary: Liver has a nodular contour with enlargement of the
caudate lobe and left hepatic lobe. Findings are compatible with
cirrhosis. Gallbladder appears to be surgically absent. No discrete
liver lesion identified.

Pancreas: Unremarkable. No pancreatic ductal dilatation or
surrounding inflammatory changes.

Spleen: Spleen is enlarged measuring 11.8 x 7.6 x 14.1 cm,
calculated volume is approximately 632 mL.

Adrenals/Urinary Tract: Normal appearance of the adrenal glands.
Indeterminate hyperdense exophytic lesion in the right kidney lower
pole measuring up to 1.2 cm. There was similar sized structure in
this area in [BW] suggesting this could represent a complex cyst but
indeterminate. Again noted is a very large left renal cyst with
internal septations. Left renal cyst measures 11.4 x 8.9 x 10.3 cm.
No suspicious left renal lesion. Normal appearance of the urinary
bladder.

Stomach/Bowel: No evidence for bowel dilatation or focal bowel
inflammation. Mild wall thickening in the distal stomach/proximal
duodenum is similar to the previous examination.

Lymphatic: No lymph node enlargement in the abdomen or pelvis.
Prominent small vascular structures along the anterior abdominal
cavity related to the patient's portal hypertension and cirrhosis.

Reproductive: Status post hysterectomy. No adnexal masses.

Other: Trace ascites in the pelvis.  No abdominal ascites.

Musculoskeletal: Multilevel disc space narrowing in the lumbar spine
most prominent at L2-L3. Degenerative facet arthropathy in lumbar
spine. No acute bone abnormality.
IMPRESSION: VASCULAR

1. Extensive atherosclerotic disease involving the abdominal aorta
with a small caliber, measuring as small as 1.0 cm. Focal ectasia of
the infrarenal abdominal aorta measuring up to 2.0 cm.
2. Main mesenteric arteries are patent. Mild narrowing involving the
origin of the celiac artery and SMA. The mesenteric vascular disease
is not suggestive for chronic mesenteric ischemia.
3. Prominent esophageal varices with enlarged portal vein. Findings
are compatible with portal hypertension secondary to cirrhosis.
Prominent vessels along the anterior abdominal cavity related to the
portal hypertension.

NON-VASCULAR

1. Cirrhosis with esophageal varices and splenomegaly. Findings are
compatible with portal hypertension. Trace fluid in the pelvis. No
abdominal ascites.
2. Chronic large left renal cyst measuring up to 11.4 cm, minimally
changed since [BW]. This potentially could be a source of pain
although this is a chronic finding.
3. Indeterminate hyperdense structure in the right kidney lower
pole, measuring 1.2 cm. There was a similar sized structure in this
area on the previous examination suggesting this could represent a
complex or hemorrhagic cyst but indeterminate. Consider further
evaluation with an abdominal MRI (with and without contrast) to
evaluate the renal lesion. The liver could also be evaluated at the
same time to screen for hepatocellular carcinoma.

## 2021-04-24 MED ORDER — IOHEXOL 350 MG/ML SOLN
100.0000 mL | Freq: Once | INTRAVENOUS | Status: AC | PRN
  Administered 2021-04-24: 100 mL via INTRAVENOUS

## 2021-04-28 ENCOUNTER — Telehealth (INDEPENDENT_AMBULATORY_CARE_PROVIDER_SITE_OTHER): Payer: Self-pay

## 2021-04-28 NOTE — Telephone Encounter (Signed)
Faxed [facility] the denial asked that they check with Dr. Manuella Ghazi patient pcp. ?

## 2021-04-28 NOTE — Telephone Encounter (Signed)
Patient had a rx request sent by [facility] for Farxiga 10 mg once po Qd # 90 Please advise. ?

## 2021-04-28 NOTE — Telephone Encounter (Signed)
That's a medicine for diabetes, she needs to ask for these refills to her PCP ?

## 2021-04-29 ENCOUNTER — Other Ambulatory Visit (INDEPENDENT_AMBULATORY_CARE_PROVIDER_SITE_OTHER): Payer: Self-pay | Admitting: Gastroenterology

## 2021-04-29 DIAGNOSIS — I8510 Secondary esophageal varices without bleeding: Secondary | ICD-10-CM

## 2021-04-29 MED ORDER — CARVEDILOL 12.5 MG PO TABS: 12.5000 mg | ORAL_TABLET | Freq: Two times a day (BID) | ORAL | 3 refills | Status: DC

## 2021-04-30 ENCOUNTER — Other Ambulatory Visit (INDEPENDENT_AMBULATORY_CARE_PROVIDER_SITE_OTHER): Payer: Self-pay

## 2021-05-11 ENCOUNTER — Ambulatory Visit (HOSPITAL_COMMUNITY): Payer: Medicare Other

## 2021-05-22 ENCOUNTER — Ambulatory Visit (HOSPITAL_COMMUNITY)
Admission: RE | Admit: 2021-05-22 | Discharge: 2021-05-22 | Disposition: A | Discharge status: Home / Self Care | Payer: Medicare Other | Source: Ambulatory Visit | Attending: Gastroenterology | Admitting: Gastroenterology

## 2021-05-22 DIAGNOSIS — N289 Disorder of kidney and ureter, unspecified: Secondary | ICD-10-CM | POA: Diagnosis not present

## 2021-05-22 DIAGNOSIS — N281 Cyst of kidney, acquired: Secondary | ICD-10-CM | POA: Diagnosis not present

## 2021-05-22 DIAGNOSIS — K7460 Unspecified cirrhosis of liver: Secondary | ICD-10-CM | POA: Diagnosis not present

## 2021-05-22 DIAGNOSIS — K769 Liver disease, unspecified: Secondary | ICD-10-CM | POA: Diagnosis not present

## 2021-05-22 DIAGNOSIS — I700 Atherosclerosis of aorta: Secondary | ICD-10-CM | POA: Diagnosis not present

## 2021-05-22 IMAGING — MR MR ABDOMEN WO/W CM
20 series · 48 of 48 positions shown · IV contrast (7 ml Gadavist)
Comparison: CT angiogram abdomen and pelvis, [DATE]

CLINICAL DATA: Incidental right inferior pole renal lesion
identified by prior CT, cirrhosis, screen for liver lesions

EXAM:
MRI ABDOMEN WITHOUT AND WITH CONTRAST
TECHNIQUE: Multiplanar multisequence MR imaging of the abdomen was performed
both before and after the administration of intravenous contrast.
CONTRAST:  7mL GADAVIST GADOBUTROL 1 MMOL/ML IV SOLN

[Series 3: cor haste · coronal · 6.0mm · 1.25mm/px · 2 of 26 slices shown]
[im 1/26]
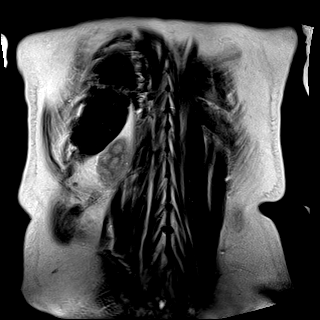
[im 26/26]
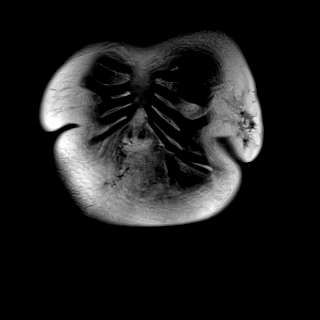

[Series 6: T2 fat-sat · axial · 6.5mm · 1.19mm/px · z∈[-175,+129]mm · 2 of 40 slices shown]
[im 1/40]
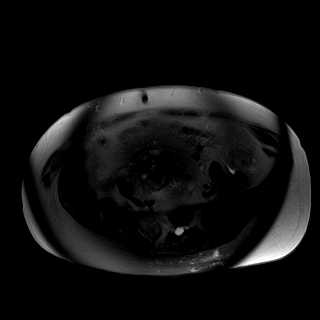
[im 40/40]
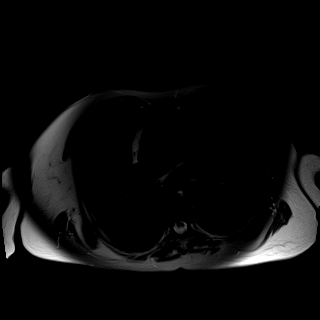

[Series 7: DWI · axial · 6.0mm · 1.42mm/px · z∈[-163,+117]mm · 2 of 40 slices shown (1 of 4)]
[im 1/40]
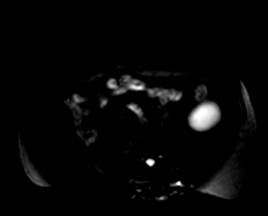
[im 40/40]
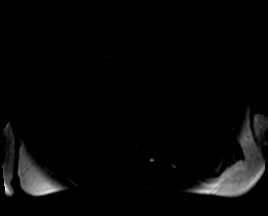

[Series 7: DWI · axial · 6.0mm · 1.42mm/px · 1 of 40 slices shown (2 of 4)]
[im 1/40]
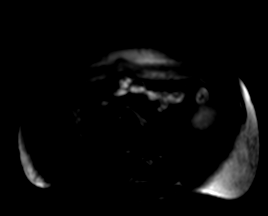

[Series 7: DWI · axial · 6.0mm · 1.42mm/px · 1 of 40 slices shown (3 of 4)]
[im 1/40]
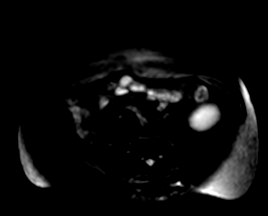

[Series 8: DWI · axial · 6.0mm · 1.42mm/px · 1 of 40 slices shown (4 of 4)]
[im 1/40]
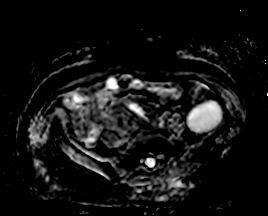

[Series 9: ax haste bh · axial · 6.0mm · 1.19mm/px · 1 of 40 slices shown]
[im 1/40]
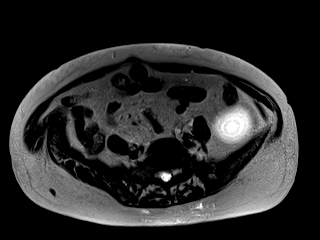

[Series 10: ax in and · axial · 3.5mm · 1.19mm/px · z∈[-183,+122]mm · 3 of 88 slices shown (1 of 2)]
[im 1/88]
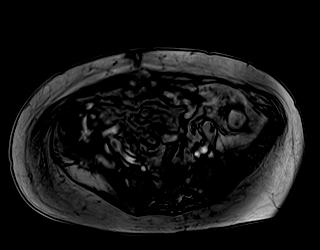
[im 44/88]
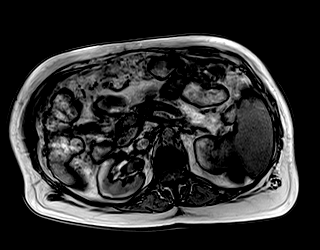
[im 88/88]
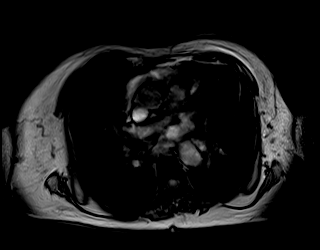

[Series 11: ax in and · axial · 3.5mm · 1.19mm/px · z∈[-183,+122]mm · 3 of 88 slices shown (2 of 2)]
[im 1/88]
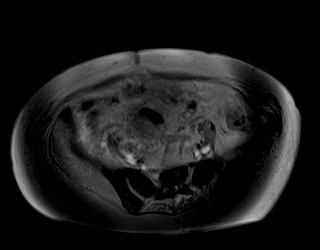
[im 44/88]
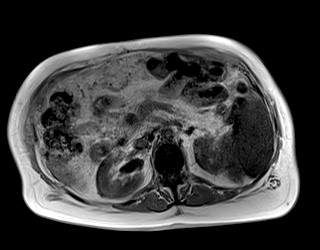
[im 88/88]
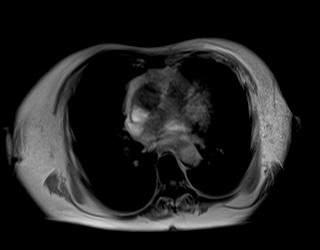

[Series 12: bSSFP · axial · 6.0mm · 0.74mm/px · z∈[-178,+116]mm · 2 of 50 slices shown]
[im 1/50]
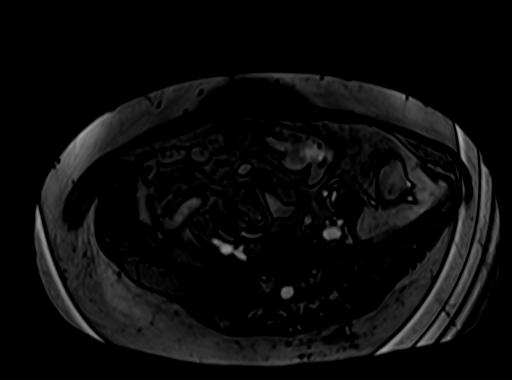
[im 50/50]
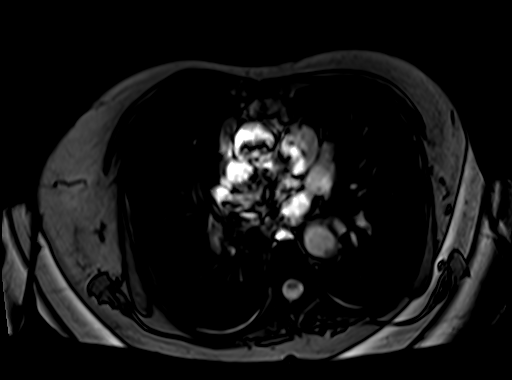

[Series 13: T1 dynamic · axial · 3.5mm · 1.19mm/px · z∈[-183,+122]mm · 3 of 88 slices shown]
[im 1/88]
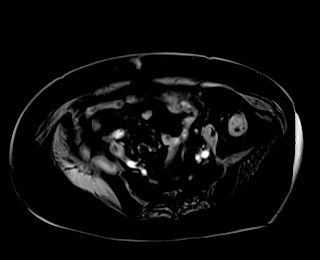
[im 44/88]
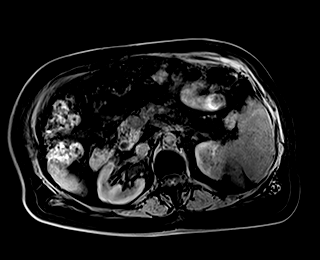
[im 88/88]
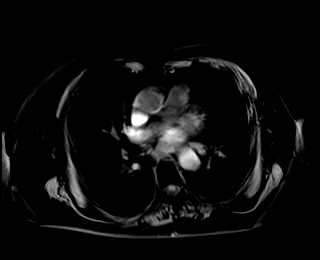

[Series 14: T1 dynamic post-contrast · axial · 3.5mm · 1.19mm/px · z∈[-183,+122]mm · 3 of 88 slices shown (1 of 9)]
[im 1/88]
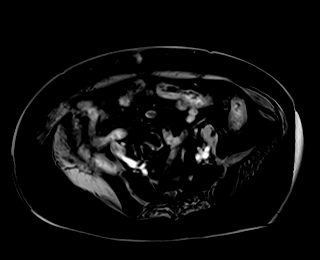
[im 44/88]
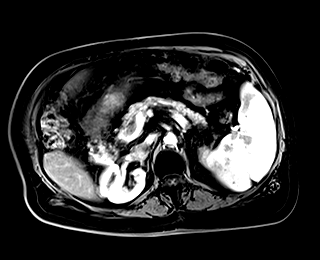
[im 88/88]
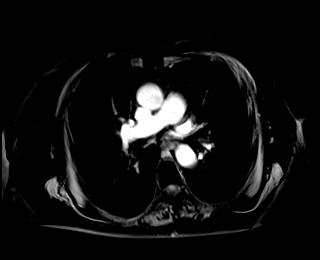

[Series 15: T1 dynamic post-contrast · axial · 3.5mm · 1.19mm/px · z∈[-183,+122]mm · 3 of 88 slices shown (2 of 9)]
[im 1/88]
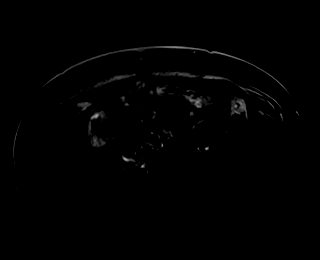
[im 44/88]
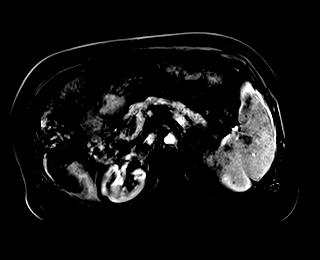
[im 88/88]
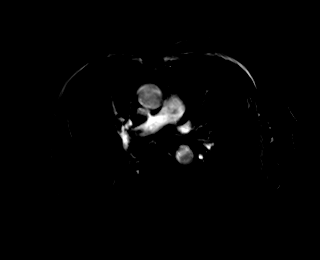

[Series 16: T1 dynamic post-contrast · axial · 3.5mm · 1.19mm/px · z∈[-183,+122]mm · 3 of 88 slices shown (3 of 9)]
[im 1/88]
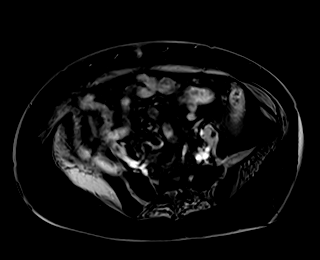
[im 44/88]
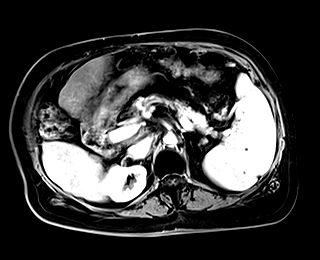
[im 88/88]
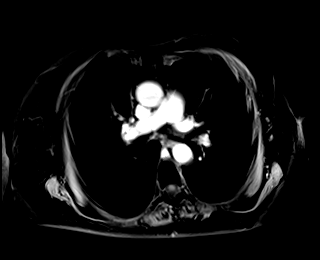

[Series 17: T1 dynamic post-contrast · axial · 3.5mm · 1.19mm/px · z∈[-183,+122]mm · 3 of 88 slices shown (4 of 9)]
[im 1/88]
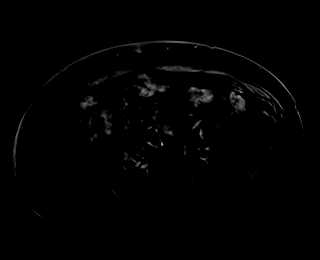
[im 44/88]
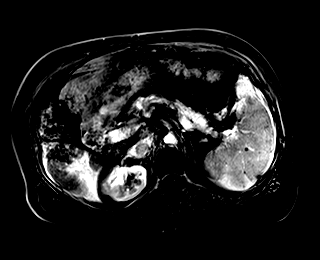
[im 88/88]
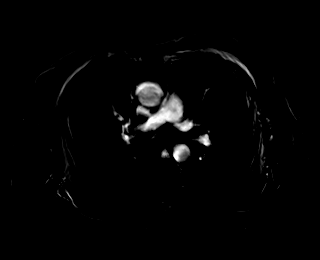

[Series 18: T1 dynamic post-contrast · axial · 3.5mm · 1.19mm/px · z∈[-183,+122]mm · 3 of 88 slices shown (5 of 9)]
[im 1/88]
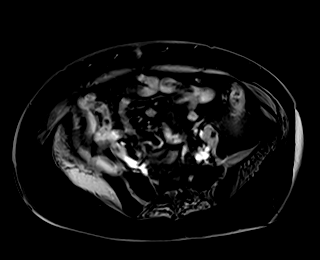
[im 44/88]
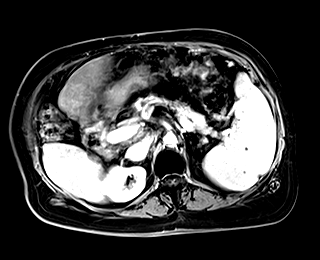
[im 88/88]
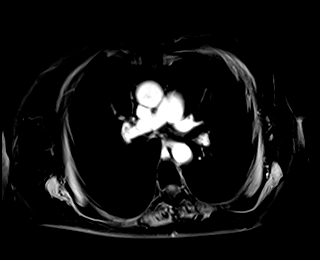

[Series 19: T1 dynamic post-contrast · axial · 3.5mm · 1.19mm/px · z∈[-183,+122]mm · 3 of 88 slices shown (6 of 9)]
[im 1/88]
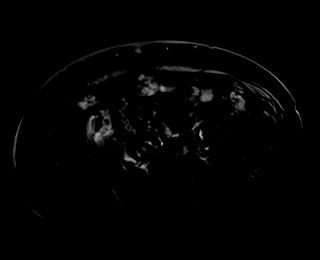
[im 44/88]
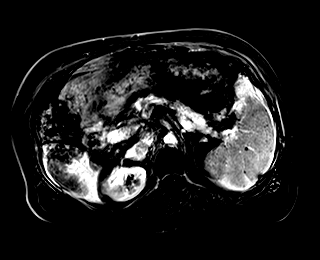
[im 88/88]
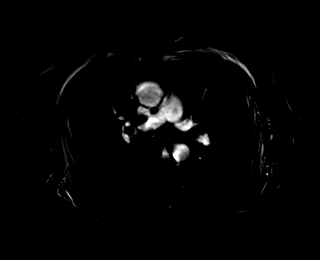

[Series 20: T1 dynamic post-contrast · coronal · 3.0mm · 1.31mm/px · 3 of 72 slices shown (7 of 9)]
[im 1/72]
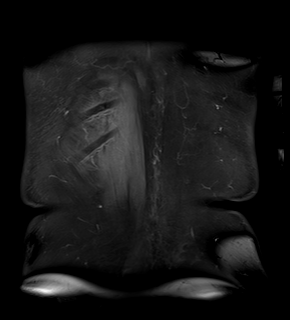
[im 36/72]
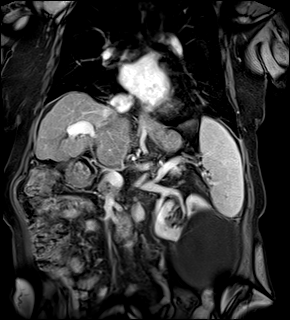
[im 72/72]
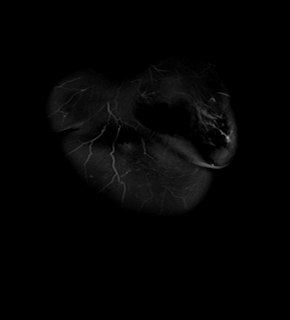

[Series 21: T1 dynamic post-contrast · axial · 3.5mm · 1.19mm/px · z∈[-183,+122]mm · 3 of 88 slices shown (8 of 9)]
[im 1/88]
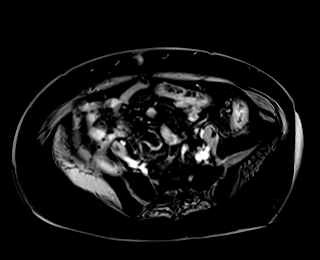
[im 44/88]
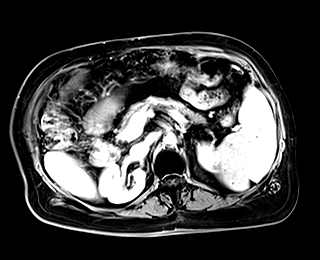
[im 88/88]
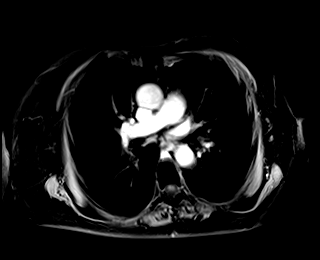

[Series 22: T1 dynamic post-contrast · axial · 3.5mm · 1.19mm/px · z∈[-183,+122]mm · 3 of 88 slices shown (9 of 9)]
[im 1/88]
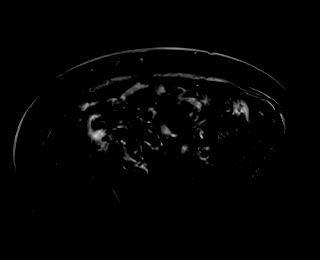
[im 44/88]
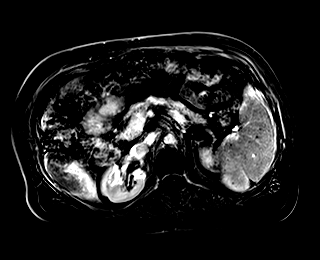
[im 88/88]
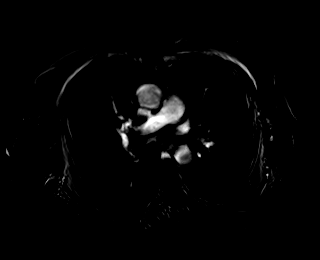

[48 of 48 positions shown; findings below may reference images not displayed]

FINDINGS: Lower chest: No acute findings.

Hepatobiliary: Coarse, nodular cirrhotic morphology of the liver. No
mass or other parenchymal abnormality identified. No gallstones. No
biliary ductal dilatation.

Pancreas: No mass, inflammatory changes, or other parenchymal
abnormality identified.No pancreatic ductal dilatation.

Spleen:  Within normal limits in size and appearance.

Adrenals/Urinary Tract: Normal adrenal glands. Large, thinly
septated exophytic cyst of the inferior pole of the left kidney,
measuring 10.0 cm (series 6, image 32). Intrinsically T1 and T2
hypointense, nonenhancing partially exophytic lesion of the inferior
pole of the right kidney, consistent with a hemorrhagic
proteinaceous cyst (series 13, image 58). No solid renal masses or
suspicious contrast enhancement identified. No evidence of
hydronephrosis.

Stomach/Bowel: Visualized portions within the abdomen are
unremarkable.

Vascular/Lymphatic: No pathologically enlarged lymph nodes
identified. No abdominal aortic aneurysm demonstrated. Aortic
atherosclerosis.

Other:  None.

Musculoskeletal: No suspicious osseous lesions identified.
IMPRESSION: 1. Nonenhancing lesion of the inferior pole of the right kidney,
consistent with a benign hemorrhagic or proteinaceous cyst, for
which no further follow-up or characterization is required
2. Large, thinly septated exophytic cyst of the inferior pole of the
left kidney, measuring 10.0 cm. Although definitively benign and
requiring no further follow-up or characterization, this may be
symptomatic due to large size and mass effect.
3. Coarse, nodular cirrhotic morphology of the liver. No mass or
suspicious contrast enhancement.

Aortic Atherosclerosis ([NK]-[NK]).

## 2021-05-22 MED ORDER — GADOBUTROL 1 MMOL/ML IV SOLN
7.0000 mL | Freq: Once | INTRAVENOUS | Status: AC | PRN
  Administered 2021-05-22: 7 mL via INTRAVENOUS

## 2021-06-02 DIAGNOSIS — E1142 Type 2 diabetes mellitus with diabetic polyneuropathy: Secondary | ICD-10-CM | POA: Diagnosis not present

## 2021-06-02 DIAGNOSIS — Z299 Encounter for prophylactic measures, unspecified: Secondary | ICD-10-CM | POA: Diagnosis not present

## 2021-06-02 DIAGNOSIS — M1710 Unilateral primary osteoarthritis, unspecified knee: Secondary | ICD-10-CM | POA: Diagnosis not present

## 2021-06-02 DIAGNOSIS — Z6825 Body mass index (BMI) 25.0-25.9, adult: Secondary | ICD-10-CM | POA: Diagnosis not present

## 2021-06-02 DIAGNOSIS — I10 Essential (primary) hypertension: Secondary | ICD-10-CM | POA: Diagnosis not present

## 2021-07-15 DIAGNOSIS — E1165 Type 2 diabetes mellitus with hyperglycemia: Secondary | ICD-10-CM | POA: Diagnosis not present

## 2021-07-15 DIAGNOSIS — M1712 Unilateral primary osteoarthritis, left knee: Secondary | ICD-10-CM | POA: Diagnosis not present

## 2021-07-15 DIAGNOSIS — I10 Essential (primary) hypertension: Secondary | ICD-10-CM | POA: Diagnosis not present

## 2021-09-02 DIAGNOSIS — E119 Type 2 diabetes mellitus without complications: Secondary | ICD-10-CM | POA: Diagnosis not present

## 2021-09-02 DIAGNOSIS — H353132 Nonexudative age-related macular degeneration, bilateral, intermediate dry stage: Secondary | ICD-10-CM | POA: Diagnosis not present

## 2021-09-02 DIAGNOSIS — H43811 Vitreous degeneration, right eye: Secondary | ICD-10-CM | POA: Diagnosis not present

## 2021-09-08 DIAGNOSIS — Z299 Encounter for prophylactic measures, unspecified: Secondary | ICD-10-CM | POA: Diagnosis not present

## 2021-09-08 DIAGNOSIS — G8194 Hemiplegia, unspecified affecting left nondominant side: Secondary | ICD-10-CM | POA: Diagnosis not present

## 2021-09-08 DIAGNOSIS — D696 Thrombocytopenia, unspecified: Secondary | ICD-10-CM | POA: Diagnosis not present

## 2021-09-08 DIAGNOSIS — E1165 Type 2 diabetes mellitus with hyperglycemia: Secondary | ICD-10-CM | POA: Diagnosis not present

## 2021-09-08 DIAGNOSIS — I10 Essential (primary) hypertension: Secondary | ICD-10-CM | POA: Diagnosis not present

## 2021-09-08 DIAGNOSIS — M1990 Unspecified osteoarthritis, unspecified site: Secondary | ICD-10-CM | POA: Diagnosis not present

## 2021-09-14 DIAGNOSIS — Z7982 Long term (current) use of aspirin: Secondary | ICD-10-CM | POA: Diagnosis not present

## 2021-09-14 DIAGNOSIS — N3000 Acute cystitis without hematuria: Secondary | ICD-10-CM | POA: Diagnosis not present

## 2021-09-14 DIAGNOSIS — Z7984 Long term (current) use of oral hypoglycemic drugs: Secondary | ICD-10-CM | POA: Diagnosis not present

## 2021-09-14 DIAGNOSIS — Z885 Allergy status to narcotic agent status: Secondary | ICD-10-CM | POA: Diagnosis not present

## 2021-09-14 DIAGNOSIS — I10 Essential (primary) hypertension: Secondary | ICD-10-CM | POA: Diagnosis not present

## 2021-09-14 DIAGNOSIS — Z888 Allergy status to other drugs, medicaments and biological substances status: Secondary | ICD-10-CM | POA: Diagnosis not present

## 2021-09-14 DIAGNOSIS — E119 Type 2 diabetes mellitus without complications: Secondary | ICD-10-CM | POA: Diagnosis not present

## 2021-09-14 DIAGNOSIS — R0602 Shortness of breath: Secondary | ICD-10-CM | POA: Diagnosis not present

## 2021-09-14 DIAGNOSIS — D61818 Other pancytopenia: Secondary | ICD-10-CM | POA: Diagnosis not present

## 2021-09-14 DIAGNOSIS — R531 Weakness: Secondary | ICD-10-CM | POA: Diagnosis not present

## 2021-09-14 DIAGNOSIS — Z79899 Other long term (current) drug therapy: Secondary | ICD-10-CM | POA: Diagnosis not present

## 2021-09-14 DIAGNOSIS — Z87891 Personal history of nicotine dependence: Secondary | ICD-10-CM | POA: Diagnosis not present

## 2021-09-14 DIAGNOSIS — Z91018 Allergy to other foods: Secondary | ICD-10-CM | POA: Diagnosis not present

## 2021-09-14 DIAGNOSIS — J9811 Atelectasis: Secondary | ICD-10-CM | POA: Diagnosis not present

## 2021-09-14 DIAGNOSIS — Z880 Allergy status to penicillin: Secondary | ICD-10-CM | POA: Diagnosis not present

## 2021-09-16 DIAGNOSIS — K7460 Unspecified cirrhosis of liver: Secondary | ICD-10-CM | POA: Diagnosis not present

## 2021-09-16 DIAGNOSIS — U071 COVID-19: Secondary | ICD-10-CM | POA: Diagnosis not present

## 2021-09-16 DIAGNOSIS — I10 Essential (primary) hypertension: Secondary | ICD-10-CM | POA: Diagnosis not present

## 2021-09-16 DIAGNOSIS — N390 Urinary tract infection, site not specified: Secondary | ICD-10-CM | POA: Diagnosis not present

## 2021-09-16 DIAGNOSIS — Z299 Encounter for prophylactic measures, unspecified: Secondary | ICD-10-CM | POA: Diagnosis not present

## 2021-09-30 DIAGNOSIS — D508 Other iron deficiency anemias: Secondary | ICD-10-CM | POA: Diagnosis not present

## 2021-09-30 DIAGNOSIS — D696 Thrombocytopenia, unspecified: Secondary | ICD-10-CM | POA: Diagnosis not present

## 2021-09-30 DIAGNOSIS — D72819 Decreased white blood cell count, unspecified: Secondary | ICD-10-CM | POA: Diagnosis not present

## 2021-10-09 ENCOUNTER — Encounter (INDEPENDENT_AMBULATORY_CARE_PROVIDER_SITE_OTHER): Payer: Self-pay | Admitting: *Deleted

## 2021-10-12 ENCOUNTER — Ambulatory Visit (INDEPENDENT_AMBULATORY_CARE_PROVIDER_SITE_OTHER): Payer: Medicare Other | Admitting: Gastroenterology

## 2021-10-27 ENCOUNTER — Ambulatory Visit (INDEPENDENT_AMBULATORY_CARE_PROVIDER_SITE_OTHER): Payer: Medicare Other | Admitting: Gastroenterology

## 2021-10-27 ENCOUNTER — Telehealth (INDEPENDENT_AMBULATORY_CARE_PROVIDER_SITE_OTHER): Payer: Self-pay | Admitting: *Deleted

## 2021-10-27 ENCOUNTER — Encounter (INDEPENDENT_AMBULATORY_CARE_PROVIDER_SITE_OTHER): Payer: Self-pay | Admitting: Gastroenterology

## 2021-10-27 VITALS — BP 144/84 | HR 78 | Temp 97.7°F | Ht 63.0 in | Wt 132.0 lb

## 2021-10-27 DIAGNOSIS — D649 Anemia, unspecified: Secondary | ICD-10-CM

## 2021-10-27 DIAGNOSIS — K582 Mixed irritable bowel syndrome: Secondary | ICD-10-CM | POA: Diagnosis not present

## 2021-10-27 DIAGNOSIS — R1030 Lower abdominal pain, unspecified: Secondary | ICD-10-CM | POA: Diagnosis not present

## 2021-10-27 DIAGNOSIS — K7581 Nonalcoholic steatohepatitis (NASH): Secondary | ICD-10-CM | POA: Diagnosis not present

## 2021-10-27 DIAGNOSIS — K7460 Unspecified cirrhosis of liver: Secondary | ICD-10-CM | POA: Diagnosis not present

## 2021-10-27 NOTE — Telephone Encounter (Signed)
Patient seen today and rx for dicyclomine was changed from bid to tid. They went to pharmacy and they did not have updated directions or quantity. Daughter wanted to know if this could be sent to [facility].   Would like a call back after its sent  [phone].

## 2021-10-27 NOTE — Patient Instructions (Addendum)
It was nice to meet you! -We will update liver specific labs today and get you setup for your 6 month US of the liver, this is due in early October. -Let's try increasing the dicyclomine 3m to every 8 hours, you should take this 30-45 minutes prior to eating to see if this helps with pain after you eat. If this does not improve your symptoms, please let me know. I am also providing a food guide that can help with IBS.  -Please make sure that you are taking your carvedilol 12.599mtwice a day and your lasix 20109maily, these are important to keep liver related issues under control. As you have refills on these medications with [facility], you can call and have them [facility], they will also be able to tell you the cost of them before doing so.   - Reduce salt intake to <2 g per day - Can take Tylenol max of 2 g per day (650 mg q8h) for pain - Avoid NSAIDs for pain - Avoid eating raw oysters/shellfish   Follow up 6 months

## 2021-10-27 NOTE — Progress Notes (Signed)
Referring Provider: Monico Blitz, MD Primary Care Physician:  Monico Blitz, MD Primary GI Physician: castaneda  Chief Complaint  Patient presents with   Cirrhosis    Patient arrives with her daughters. Has concerns about abdominal pain. Only eats once daily in the afternoons. Stomach cramps after eating.    HPI:   Jeanette Chang is a 81 y.o. 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  History: Last seen 04/13/21, at that time having bloating after meals and abdominal cramping with some diarrhea. Reported multiple watery BMs, taking imodium which then caused constipation. Also with some nausea but no vomiting  Notably, she was previously referred to [facility] for evaluation of gastric polyp per request of hematology. Evaluated by Dr. Chales Abrahams, who considered that given hemoglobin stability and high risk of complications with polyp size and severe thrombocytopenia, no need for endoscopic resection was warranted for gastric polyp. Most recent Hb on 04/02/21 was 11.5.  Patient started on bentyl q12h, C diff and GI path panel checked, CT angio abdomen and pelvis with iv contrast, continued on carvedilol and diuretics.   CTA AP 04/24/21 Cirrhosis with esophageal varices and splenomegaly. 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 Trace fluid in the pelvis. No abdominal ascites. 2. 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 This potentially could be a source of pain although this is a chronic finding. Indeterminate hyperdense structure in the right kidney lower pole, measuring 1.2 cm. There was a similar sized structure in this area on the previous examination suggesting this could represent a complex or hemorrhagic cyst but indeterminate. Consider further evaluation with an abdominal MRI (with and without  contrast) to evaluate the renal lesion. The liver could also be evaluated at the same time to screen for hepatocellular carcinoma.  MR Abd w wo 05/22/21 Nonenhancing lesion of the inferior pole of the right kidney, consistent with a benign hemorrhagic or proteinaceous cyst, for which no further follow-up or characterization is required 2. Large, thinly septated exophytic cyst of the inferior pole of the left kidney, measuring 10.0 cm. Although definitively benign and requiring no further follow-up or characterization, this may be symptomatic due to large size and mass effect. 3. Coarse, nodular cirrhotic morphology of the liver. No mass or suspicious contrast enhancement.   C diff and GI path negative Last Labs: INR 02/05/21 0.89 AFP 04/17/21 2.2  09/30/21 with WBC 3.1, Hgb 9.6, plt 60k, T bili 0.5, AST 30, ALT 34, AP 106, Ferritin 41.3, Iron 75, TIBC 299, Iron sat 25%  Carvedilol increased to 12.49m BID in march 2023, maintained on lasix 243mdaily   Present: Patient reports that she has abdominal pain, worsened by eating, states that pain starts in mid abdomen and travels down to lower abdomen. Pain is relieved by having a BM. She reports that she has a mix of both diarrhea and constipation. She may have up 3-4 BMs a day some days. Sometimes stools are watery. She is not taking anything for her diarrhea. She states that her husband passed away recently and she was not taking her medications during the time he was in the hospital. She is using bentyl but does not feel that it really helps her pain. She sometimes feels that she has a build up of gas in her stomach. She notes that she takes bentyl after her abdominal pain starts, does  not notice a lot of relief from this. She restarted all of her medications last night. She states that appetite is not great recently. She denies any blood in stools or melena. No nausea or vomiting. She denies any swelling to her abdominal. She denies episodes of confusion,  jaundice or pruritus. Weight stable at 132 lbs today, previously 135 lbs.  Patient has been having issues with her medications being too expensive, [facility]. She also notes that she follows with hematology in [place] at [facility] but has had issues with her current hematologist and has not been able to see them, she wishes to transfer her care to [place].   Most recent MELD: 13   Cirrhosis related questions: Episodes of confusion/disorientation:  no  Taking diuretics? Lasix 78m daily  Beta blockers? Carvedilol 12.570mBID Prior history of variceal banding? no Prior episodes of SBP?no  Last liver imaging: MRI April 2023, cirrhosis, no masses    Last EGD:06/17/2020 Grade II varices were found in the lower third of the esophagus. An area of irregularity was found in the posterior rim of the pyloric channed. This was visualized with the aid of a transparent cap. I pulled the area with a cold forceps and was able to see a medium-sized - 1 cm, frond-like/villous, non-circumferential mass with no bleeding and no stigmata of recent bleeding was found at the pylorus. It had some adenomatous features - unclear if coming from pylorus or from duodenal bulb. One biopsy was taken with a cold forceps for histology, no more biopsies were performed as patient presented significant bleeding that stopped on its own. The examined duodenum and ampulla were normal. Path: Granulation tissue neg for malignancy or HP. Last Colonoscopy:06/2019 - two small cecal and SF polyps, one cecal 6 mm polyp. All polyps were TA. Hemorrhoids  Past Medical History:  Diagnosis Date   Abnormal LFTs    Achilles bursitis or tendinitis    Acute maxillary sinusitis    Acute pharyngitis    Anemia, unspecified    Candidiasis of skin and nails    Dermatophytosis of foot    Dermatophytosis of the body    Diverticulitis of colon (without mention of hemorrhage)(562.11)    Edema     Headache(784.0)    HOH (hard of hearing)    Malaise and fatigue    Occlusion and stenosis of carotid artery without mention of cerebral infarction    Osteoarthrosis, unspecified whether generalized or localized, unspecified site    Other dyspnea and respiratory abnormality    Other psoriasis    Other specified disorders of rotator cuff syndrome of shoulder and allied disorders    Pain in joint    Pure hypercholesterolemia    RUQ pain    Spasm of back muscles    [place], [place]   Type II or unspecified type diabetes mellitus without mention of complication, not stated as uncontrolled    Unspecified essential hypertension    Unspecified sinusitis (chronic)    Urinary incontinence     Past Surgical History:  Procedure Laterality Date   APPENDECTOMY  1964   BIOPSY  03/02/2017   Procedure: BIOPSY;  Surgeon: ReRogene HoustonMD;  [facility];  Service: Endoscopy;;  antral   BIOPSY  06/17/2020   Procedure: BIOPSY;  Surgeon: CaHarvel QualeMD;  [facility];  Service: Gastroenterology;;   CAROTID ENDARTERECTOMY  2005   Left CEA  CAROTID ENDARTERECTOMY  2009   Right CEA   CATARACT EXTRACTION, BILATERAL Bilateral    CHOLECYSTECTOMY  1982   Gall Bladder   COLONOSCOPY  09   COLONOSCOPY N/A 06/27/2019   Procedure: COLONOSCOPY;  Surgeon: Rogene Houston, MD;  [facility];  Service: Endoscopy;  Laterality: N/A;   ESOPHAGOGASTRODUODENOSCOPY N/A 03/02/2017   Procedure: ESOPHAGOGASTRODUODENOSCOPY (EGD);  Surgeon: Rogene Houston, MD;  [facility];  Service: Endoscopy;  Laterality: N/A;  12:55   ESOPHAGOGASTRODUODENOSCOPY N/A 06/27/2019   Procedure: ESOPHAGOGASTRODUODENOSCOPY (EGD);  Surgeon: Rogene Houston, MD;  [facility];  Service: Endoscopy;  Laterality: N/A;  730   ESOPHAGOGASTRODUODENOSCOPY (EGD) WITH PROPOFOL N/A 06/17/2020   Procedure: ESOPHAGOGASTRODUODENOSCOPY (EGD) WITH PROPOFOL;  Surgeon:  Harvel Quale, MD;  [facility];  Service: Gastroenterology;  Laterality: N/A;  AM   GIVENS CAPSULE STUDY N/A 10/24/2019   Procedure: GIVENS CAPSULE STUDY;  Surgeon: Rogene Houston, MD;  [facility];  Service: Endoscopy;  Laterality: N/A;  730   KNEE ARTHROSCOPY WITH MEDIAL MENISECTOMY Left 07/10/2019   Procedure: KNEE ARTHROSCOPY WITH MEDIAL MENISCECTOMY;  Surgeon: Carole Civil, MD;  Location: [facility];  Service: Orthopedics;  Laterality: Left;   KNEE ARTHROSCOPY WITH MEDIAL MENISECTOMY Left 03/14/2020   Procedure: KNEE ARTHROSCOPY WITH MEDIAL MENISCECTOMY;  Surgeon: Carole Civil, MD;  Location: [facility];  Service: Orthopedics;  Laterality: Left;   NOSE SURGERY  1978   PARATHYROIDECTOMY  1992   POLYPECTOMY  03/02/2017   Procedure: POLYPECTOMY;  Surgeon: Rogene Houston, MD;  [facility];  Service: Endoscopy;;  gastric    POLYPECTOMY  06/27/2019   Procedure: POLYPECTOMY;  Surgeon: Rogene Houston, MD;  [facility];  Service: Endoscopy;;  colon   Power port  03/06/2009   Right upper chest    TONSILLECTOMY     TUMOR EXCISION Right August 24, 2013   [facility] Dr. Ellen Henri, ENT   VAGINAL HYSTERECTOMY  [phone]    Current Outpatient Medications  Medication Sig Dispense Refill   acetaminophen (TYLENOL) 500 MG tablet Take 1 tablet (500 mg total) by mouth every 6 (six) hours as needed for moderate pain or headache. 30 tablet 0   aspirin EC 81 MG tablet Take 81 mg by mouth every other day. AT NIGHT.     Biotin 10 MG CAPS Take by mouth daily at 6 (six) AM.     carvedilol (COREG) 12.5 MG tablet Take 1 tablet (12.5 mg total) by mouth 2 (two) times daily with a meal. 180 tablet 3   cholecalciferol (VITAMIN D) 25 MCG (1000 UNIT) tablet Take 1,000 Units by mouth every evening. Takes 3 days a week     dapagliflozin propanediol (FARXIGA) 5 MG TABS tablet Take 5 mg by mouth daily.     dicyclomine (BENTYL) 10 MG capsule  Take 1 capsule (10 mg total) by mouth every 12 (twelve) hours as needed (abdominal pain). 60 capsule 2   furosemide (LASIX) 20 MG tablet Take 20 mg by mouth daily. IN THE MORNING     gabapentin (NEURONTIN) 300 MG capsule Take 300 mg by mouth at bedtime.     glimepiride (AMARYL) 2 MG tablet Take 2 mg by mouth daily.     lisinopril (PRINIVIL,ZESTRIL) 40 MG tablet Take 40 mg by mouth daily.     metFORMIN (GLUCOPHAGE) 500 MG tablet Take 1,000 mg by mouth 2 (two) times daily with a meal.  OVER THE COUNTER MEDICATION Iron as needed. Not daily     No current facility-administered medications for this visit.   Facility-Administered Medications Ordered in Other Visits  Medication Dose Route Frequency Provider Last Rate Last Admin   sodium chloride irrigation 0.9 %    PRN Carole Civil, MD   1,000 mL at 03/14/20 0945    Allergies as of 10/27/2021 - Review Complete 10/27/2021  Allergen Reaction Noted   Codeine Nausea And Vomiting and Rash 09/24/2010   Monascus purpureus went yeast Other (See Comments) 09/24/2010   Niacin and related Other (See Comments) 09/24/2010   Red yeast rice Other (See Comments) 09/24/2010   Actos [pioglitazone hydrochloride] Other (See Comments) 09/24/2010   Amaryl Nausea Only and Other (See Comments) 09/24/2010   Iron Diarrhea 04/10/2019   Cloyd Stagers [trospium chloride] Nausea Only and Other (See Comments) 09/24/2010   Statins Other (See Comments) 10/29/2014   Toviaz [fesoterodine fumarate] Other (See Comments) 09/24/2010   Norco [hydrocodone-acetaminophen] Nausea And Vomiting and Anxiety 06/13/2019   Penicillins Swelling, Rash, and Other (See Comments) 09/24/2010   Ultram [place] Mode hcl] Nausea And Vomiting 09/24/2010    Family History  Problem Relation Age of Onset   Heart attack Mother    Cancer Mother    Heart attack Father    Cancer Sister     Social History   Socioeconomic History   Marital status: Married    Spouse name: Not on file   Number of  children: Not on file   Years of education: Not on file   Highest education level: Not on file  Occupational History   Not on file  Tobacco Use   Smoking status: Former    Packs/day: 1.00    Years: 42.00    Total pack years: 42.00    Types: Cigarettes    Quit date: 03/12/1992    Years since quitting: 29.6   Smokeless tobacco: Former    Quit date: 12/16/1992  Vaping Use   Vaping Use: Never used  Substance and Sexual Activity   Alcohol use: Yes    Alcohol/week: 1.0 standard drink of alcohol    Types: 1 Glasses of wine per week    Comment: very seldom   Drug use: No   Sexual activity: Not Currently  Other Topics Concern   Not on file  Social History Narrative   Not on file   Social Determinants of Health   Financial Resource Strain: Not on file  Food Insecurity: Not on file  Transportation Needs: Not on file  Physical Activity: Not on file  Stress: Not on file  Social Connections: Not on file   Review of systems General: negative for malaise, night sweats, fever, chills, weight loss Neck: Negative for lumps, goiter, pain and significant neck swelling Resp: Negative for cough, wheezing, dyspnea at rest CV: Negative for chest pain, leg swelling, palpitations, orthopnea GI: denies melena, hematochezia, nausea, vomiting, dysphagia, odyonophagia, early satiety or unintentional weight loss. +post prandial abdominal pain +diarrhea +constipation MSK: Negative for joint pain or swelling, back pain, and muscle pain. Derm: Negative for itching or rash Psych: Denies depression, anxiety, memory loss, confusion. No homicidal or suicidal ideation.  Heme: Negative for prolonged bleeding, bruising easily, and swollen nodes. Endocrine: Negative for cold or heat intolerance, polyuria, polydipsia and goiter. Neuro: negative for tremor, gait imbalance, syncope and seizures. The remainder of the review of systems is noncontributory.  Physical Exam: There were no vitals taken for this  visit. General:   Alert and  oriented. No distress noted. Pleasant and cooperative.  Head:  Normocephalic and atraumatic. Eyes:  Conjuctiva clear without scleral icterus. Mouth:  Oral mucosa pink and moist. Good dentition. No lesions. Heart: Normal rate and rhythm, s1 and s2 heart sounds present.  Lungs: Clear lung sounds in all lobes. Respirations equal and unlabored. Abdomen:  +BS, soft, non-tender and non-distended. No rebound or guarding. No HSM or masses noted. Derm: No palmar erythema or jaundice Msk:  Symmetrical without gross deformities. Normal posture. Extremities:  Without edema. Neurologic:  Alert and  oriented x4 Psych:  Alert and cooperative. Normal mood and affect.  Invalid input(s): "6 MONTHS"   ASSESSMENT: Jeanette Chang is a 81 y.o. female presenting today for follow up of Cirrhosis and IBS-M.  Cirrhosis: last imaging via MRI in April 2023 without obvious liver lesions. She denies ascites, confusion, jaundice or pruritus. Husband recently passed and she notes not being compliant with her medication at that time. She started back all of her meds last night. I encouraged her to be compliant with these medications in order to avoid adverse events. We will continue with carvedilol 12.83m BID and Lasix 250mdaily. Last MELD 13. CMP and CBC done in August. Will update AFP and INR today, RUQ US[place]ue in Early October.   Abdominal pain/IBS-M: patient with ongoing postprandial abdominal pain and mix of both constipation and diarrhea. Workup earlier this year with CT angio A/P without findings of mesenteric ischemia or other causes of her symptoms. Colonoscopy in 2021 with only a few polyps. She denies rectal bleeding or melena. No nausea or vomiting. Avoids eating due to pain. She is using dicyclomine only PRN on occasion, takes after pain starts. She has notably been under more stress recently with the passing of her husband as well.  I explained indications of IBS with the patient to  include hypersensitivity of the bowels that is often heavily influenced by certain food/drink triggers and emotional/mental triggers. We discussed the importance of utilizing the low FODMAP guide to help determine specific trigger foods and increasing foods that tend to be more well tolerated. I also discussed the use of anti spasmodics for this condition which help to calm down the activity in the bowels, decreasing cramping and looser stools. Will try increasing dicyclomine to TID as she has not had any side effects from it thus far, I encouraged her to take it 30-45 minutes prior to her meals instead of after onset of pain. If she is not having relief from scheduled dosing, we can try Levsin.   [facility]  In regards to prescription transfer due to cost, I advised patient's family that med costs may be better with GoodRx card which they have. They also can [facility] to get [facility] on her medications and have them transferred there if that's how they wish to proceed.   PLAN:  -AFP, INR  - RUQ USKoreactober 2023  -bentyl 1042mID 30-45 minutes prior to meals, trial levsin if no improvement -Continue carvedilol 12.5mg5mD -continue lasix 20mg35mly - Reduce salt intake to <2 g per day - Can take Tylenol max of 2 g per day (650 mg q8h) for pain - Avoid NSAIDs for pain - Avoid eating raw oysters/shellfish -[facility]  All questions were answered, patient and family verbalized understanding and are in agreement with plan  as outlined above.    Follow Up: 6 months  Derisha Funderburke L. Alver Sorrow, MSN, APRN, AGNP-C Adult-Gerontology Nurse Practitioner [facility]

## 2021-10-28 ENCOUNTER — Other Ambulatory Visit (INDEPENDENT_AMBULATORY_CARE_PROVIDER_SITE_OTHER): Payer: Self-pay | Admitting: Gastroenterology

## 2021-10-28 DIAGNOSIS — R1030 Lower abdominal pain, unspecified: Secondary | ICD-10-CM

## 2021-10-28 LAB — PROTIME-INR
INR: 1.1 (ref 0.9–1.2)
Prothrombin Time: 12 s (ref 9.1–12.0)

## 2021-10-28 LAB — AFP TUMOR MARKER: AFP, Serum, Tumor Marker: <1.8 ng/mL (ref 0.0–8.7)

## 2021-10-28 MED ORDER — DICYCLOMINE HCL 10 MG PO CAPS: 10.0000 mg | ORAL_CAPSULE | Freq: Three times a day (TID) | ORAL | 3 refills | Status: DC

## 2021-10-28 NOTE — Telephone Encounter (Signed)
I called and spoke with patient and she told me she only has two pills left and needs refill with new directions. [facility]

## 2021-11-03 ENCOUNTER — Ambulatory Visit (HOSPITAL_COMMUNITY)
Admission: RE | Admit: 2021-11-03 | Discharge: 2021-11-03 | Disposition: A | Discharge status: Home / Self Care | Payer: Medicare Other | Source: Ambulatory Visit | Attending: Gastroenterology | Admitting: Gastroenterology

## 2021-11-03 DIAGNOSIS — K7581 Nonalcoholic steatohepatitis (NASH): Secondary | ICD-10-CM | POA: Insufficient documentation

## 2021-11-03 DIAGNOSIS — K7460 Unspecified cirrhosis of liver: Secondary | ICD-10-CM | POA: Insufficient documentation

## 2021-11-03 DIAGNOSIS — R188 Other ascites: Secondary | ICD-10-CM | POA: Diagnosis not present

## 2021-11-09 ENCOUNTER — Encounter (INDEPENDENT_AMBULATORY_CARE_PROVIDER_SITE_OTHER): Payer: Self-pay

## 2021-11-27 DIAGNOSIS — D61818 Other pancytopenia: Secondary | ICD-10-CM | POA: Diagnosis not present

## 2021-11-27 DIAGNOSIS — Z6823 Body mass index (BMI) 23.0-23.9, adult: Secondary | ICD-10-CM | POA: Diagnosis not present

## 2021-11-27 DIAGNOSIS — D500 Iron deficiency anemia secondary to blood loss (chronic): Secondary | ICD-10-CM | POA: Diagnosis not present

## 2021-12-02 DIAGNOSIS — D509 Iron deficiency anemia, unspecified: Secondary | ICD-10-CM | POA: Diagnosis not present

## 2021-12-02 DIAGNOSIS — Z23 Encounter for immunization: Secondary | ICD-10-CM | POA: Diagnosis not present

## 2021-12-08 DIAGNOSIS — D509 Iron deficiency anemia, unspecified: Secondary | ICD-10-CM | POA: Diagnosis not present

## 2021-12-11 ENCOUNTER — Encounter: Payer: Medicare Other | Admitting: Hematology

## 2021-12-18 DIAGNOSIS — R5383 Other fatigue: Secondary | ICD-10-CM | POA: Diagnosis not present

## 2021-12-18 DIAGNOSIS — Z7189 Other specified counseling: Secondary | ICD-10-CM | POA: Diagnosis not present

## 2021-12-18 DIAGNOSIS — Z1339 Encounter for screening examination for other mental health and behavioral disorders: Secondary | ICD-10-CM | POA: Diagnosis not present

## 2021-12-18 DIAGNOSIS — E1165 Type 2 diabetes mellitus with hyperglycemia: Secondary | ICD-10-CM | POA: Diagnosis not present

## 2021-12-18 DIAGNOSIS — E559 Vitamin D deficiency, unspecified: Secondary | ICD-10-CM | POA: Diagnosis not present

## 2021-12-18 DIAGNOSIS — Z1331 Encounter for screening for depression: Secondary | ICD-10-CM | POA: Diagnosis not present

## 2021-12-18 DIAGNOSIS — Z0000 Encounter for general adult medical examination without abnormal findings: Secondary | ICD-10-CM | POA: Diagnosis not present

## 2021-12-18 DIAGNOSIS — Z299 Encounter for prophylactic measures, unspecified: Secondary | ICD-10-CM | POA: Diagnosis not present

## 2021-12-18 DIAGNOSIS — I10 Essential (primary) hypertension: Secondary | ICD-10-CM | POA: Diagnosis not present

## 2021-12-18 DIAGNOSIS — Z6823 Body mass index (BMI) 23.0-23.9, adult: Secondary | ICD-10-CM | POA: Diagnosis not present

## 2021-12-18 DIAGNOSIS — Z79899 Other long term (current) drug therapy: Secondary | ICD-10-CM | POA: Diagnosis not present

## 2021-12-18 DIAGNOSIS — E7800 Pure hypercholesterolemia, unspecified: Secondary | ICD-10-CM | POA: Diagnosis not present

## 2021-12-22 ENCOUNTER — Ambulatory Visit (INDEPENDENT_AMBULATORY_CARE_PROVIDER_SITE_OTHER): Payer: Medicare Other | Admitting: Gastroenterology

## 2021-12-22 ENCOUNTER — Encounter (INDEPENDENT_AMBULATORY_CARE_PROVIDER_SITE_OTHER): Payer: Self-pay | Admitting: Gastroenterology

## 2021-12-22 ENCOUNTER — Encounter (INDEPENDENT_AMBULATORY_CARE_PROVIDER_SITE_OTHER): Payer: Self-pay

## 2021-12-22 VITALS — BP 151/81 | HR 76 | Temp 97.5°F | Ht 63.0 in | Wt 129.7 lb

## 2021-12-22 DIAGNOSIS — K625 Hemorrhage of anus and rectum: Secondary | ICD-10-CM

## 2021-12-22 DIAGNOSIS — K643 Fourth degree hemorrhoids: Secondary | ICD-10-CM | POA: Diagnosis not present

## 2021-12-22 NOTE — Patient Instructions (Addendum)
We will send a cream for your hemorrhoids to help shrink them and prevent bleeding, you can use this 4x/day  Please follow up with hematology regarding your platelet count If you have worsening bleeding or other associated symptoms, please let me know   Please keep follow up with Dr. Jenetta Downer in March

## 2021-12-22 NOTE — Progress Notes (Unsigned)
Referring Provider: Monico Blitz, MD Primary Care Physician:  Monico Blitz, MD Primary GI Physician: Jenetta Downer   Chief Complaint  Patient presents with   Blood In Stools    Patient says she saw bright red blood in her stools for the last three days. Denies any constipation. She is taking bentyl three times per day. Had IV iron 12/02/2021 and 12/08/2021.   HPI:   Jeanette Chang is a 81 y.o. 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   At last visit in September 2023, patient reported some abdominal pain, worse with eating, relieved by having a BM, having both diarrhea and constipation with up to 3-4 Bms per day, using bentyl without much relief, though notes taking it after pain starts.   Labs in October with iron 42, TIBC 422, iron sat 10 ferritin 14.2, Hgb  9.4, plt count 52k  INR in September was 1.1  Notably is followed by hematology, last saw them in early October, labs rechecked, as above, she received iron infusion on 12/02/21 and 12/08/21  CT Angio A/P in march 2023 without presence of mesenteric ischemia  Present:  Patient notes that she has rectal bleeding since last Thursday or Friday. She notes that she had diarrhea after breakfast and noted blood in her stools. She also endorses recent nose bleeds, saw hematology in October and had two iron infusions, as above. She saw Dr. Manuella Ghazi on Monday and had CBC with plts lower than previously and continued anemia in the 9 range. She does note more frequent BMs over the past few days, stools are loose initially then transition more to watery. She notes that she had more lower abdominal cramping that began on Saturday. She denies nausea or vomiting. She denies any rectal bleeding between BMs. Denies melena.  She denies any rectal pain, itching or burning. She denies any recent change in meds or antibiotic  therapy.   She is taking bentyl 1/2 hour before she eats to help combat looser stools as she has history of IBS-M.    Last EGD:06/17/2020 Grade II varices were found in the lower third of the esophagus. An area of irregularity was found in the posterior rim of the pyloric channed. This was visualized with the aid of a transparent cap. I pulled the area with a cold forceps and was able to see a medium-sized - 1 cm, frond-like/villous, non-circumferential mass with no bleeding and no stigmata of recent bleeding was found at the pylorus. It had some adenomatous features - unclear if coming from pylorus or from duodenal bulb. One biopsy was taken with a cold forceps for histology, no more biopsies were performed as patient presented significant bleeding that stopped on its own. The examined duodenum and ampulla were normal. Path: Granulation tissue neg for malignancy or HP. Last Colonoscopy:06/2019 - two small cecal and SF polyps, one cecal 6 mm polyp. All polyps were TA. 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    Osteoarthrosis, unspecified whether generalized or localized, unspecified site    Other dyspnea and respiratory abnormality    Other psoriasis    Other specified disorders of rotator cuff syndrome of shoulder and allied disorders    Pain in joint    Pure hypercholesterolemia    RUQ pain    Spasm of back muscles    Thrombocytopenia, unspecified ([place])    Type II or unspecified type diabetes mellitus without mention of complication, not  stated as uncontrolled    Unspecified essential hypertension    Unspecified sinusitis (chronic)    Urinary incontinence     Past Surgical History:  Procedure Laterality Date   APPENDECTOMY  1964   BIOPSY  03/02/2017   Procedure: BIOPSY;  Surgeon: Rogene Houston, MD;  [facility];  Service: Endoscopy;;  antral   BIOPSY  06/17/2020   Procedure: BIOPSY;  Surgeon: Harvel Quale, MD;  [facility];  Service: Gastroenterology;;   CAROTID ENDARTERECTOMY  2005   Left CEA   CAROTID ENDARTERECTOMY  2009   Right CEA   CATARACT EXTRACTION, BILATERAL Bilateral    CHOLECYSTECTOMY  1982   Gall Bladder   COLONOSCOPY  09   COLONOSCOPY N/A 06/27/2019   Procedure: COLONOSCOPY;  Surgeon: Rogene Houston, MD;  [facility];  Service: Endoscopy;  Laterality: N/A;   ESOPHAGOGASTRODUODENOSCOPY N/A 03/02/2017   Procedure: ESOPHAGOGASTRODUODENOSCOPY (EGD);  Surgeon: Rogene Houston, MD;  [facility];  Service: Endoscopy;  Laterality: N/A;  12:55   ESOPHAGOGASTRODUODENOSCOPY N/A 06/27/2019   Procedure: ESOPHAGOGASTRODUODENOSCOPY (EGD);  Surgeon: Rogene Houston, MD;  [facility];  Service: Endoscopy;  Laterality: N/A;  730   ESOPHAGOGASTRODUODENOSCOPY (EGD) WITH PROPOFOL N/A 06/17/2020   Procedure: ESOPHAGOGASTRODUODENOSCOPY (EGD) WITH PROPOFOL;  Surgeon: Harvel Quale, MD;  [facility];  Service: Gastroenterology;  Laterality: N/A;  AM   GIVENS CAPSULE STUDY N/A 10/24/2019   Procedure: GIVENS CAPSULE STUDY;  Surgeon: Rogene Houston, MD;  [facility];  Service: Endoscopy;  Laterality: N/A;  730   KNEE ARTHROSCOPY WITH MEDIAL MENISECTOMY Left 07/10/2019   Procedure: KNEE ARTHROSCOPY WITH MEDIAL MENISCECTOMY;  Surgeon: Carole Civil, MD;  Location: [facility];  Service: Orthopedics;  Laterality: Left;   KNEE ARTHROSCOPY WITH MEDIAL MENISECTOMY Left 03/14/2020   Procedure: KNEE ARTHROSCOPY WITH MEDIAL  MENISCECTOMY;  Surgeon: Carole Civil, MD;  Location: [facility];  Service: Orthopedics;  Laterality: Left;   NOSE SURGERY  1978   PARATHYROIDECTOMY  1992   POLYPECTOMY  03/02/2017   Procedure: POLYPECTOMY;  Surgeon: Rogene Houston, MD;  [facility];  Service: Endoscopy;;  gastric    POLYPECTOMY  06/27/2019   Procedure: POLYPECTOMY;  Surgeon: Rogene Houston, MD;  [facility];  Service: Endoscopy;;  colon   Power port  03/06/2009   Right upper chest    TONSILLECTOMY     TUMOR EXCISION Right August 24, 2013   [facility] Dr. Ellen Henri, ENT   VAGINAL HYSTERECTOMY  [phone]    Current Outpatient Medications  Medication Sig Dispense Refill   acetaminophen (TYLENOL) 500 MG tablet Take 1 tablet (500 mg total) by mouth every 6 (six) hours as needed for moderate pain or headache. 30 tablet 0   aspirin EC 81 MG tablet Take 81 mg by mouth every other day.  AT NIGHT.     Biotin 10 MG CAPS Take by mouth daily at 6 (six) AM.     cholecalciferol (VITAMIN D) 25 MCG (1000 UNIT) tablet Take 1,000 Units by mouth daily.     dapagliflozin propanediol (FARXIGA) 5 MG TABS tablet Take 5 mg by mouth daily.     dicyclomine (BENTYL) 10 MG capsule Take 1 capsule (10 mg total) by mouth 3 (three) times daily before meals. 90 capsule 3   furosemide (LASIX) 20 MG tablet Take 20 mg by mouth as needed.     gabapentin (NEURONTIN) 300 MG capsule Take 300 mg by mouth at bedtime.     metFORMIN (GLUCOPHAGE) 500 MG tablet Take 1,000 mg by mouth 2 (two) times daily with a meal.     OVER THE COUNTER MEDICATION Iron as needed. Not daily     No current facility-administered medications for this visit.   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 Other (See Comments) 09/24/2010   Niacin and related Other (See Comments) 09/24/2010   Red yeast rice Other (See Comments) 09/24/2010   Actos [pioglitazone hydrochloride] Other (See Comments) 09/24/2010   Amaryl Nausea Only and Other (See Comments) 09/24/2010   Iron Diarrhea 04/10/2019   Cloyd Stagers [trospium chloride] Nausea Only and Other (See Comments) 09/24/2010   Statins Other (See Comments) 10/29/2014   Toviaz [fesoterodine fumarate] Other (See Comments) 09/24/2010   Norco [hydrocodone-acetaminophen] Nausea And Vomiting and Anxiety 06/13/2019   Penicillins Swelling, Rash, and Other (See Comments) 09/24/2010   Ultram [place] Mode hcl] Nausea And Vomiting 09/24/2010    Family History  Problem Relation Age of Onset   Heart attack Mother    Cancer Mother    Heart attack Father    Cancer Sister     Social History   Socioeconomic History   Marital status: Widowed    Spouse name: Not on file   Number of children: Not on file   Years of education: Not on file   Highest education level: Not on file  Occupational History   Not on file  Tobacco Use   Smoking status: Former    Packs/day: 1.00    Years: 42.00    Total pack years: 42.00    Types: Cigarettes    Quit date: 03/12/1992    Years since quitting: 29.8    Passive exposure: Past   Smokeless tobacco: Former    Quit date: 12/16/1992  Vaping Use   Vaping Use: Never used  Substance and Sexual Activity   Alcohol use: Yes    Alcohol/week: 1.0 standard drink of alcohol    Types: 1 Glasses of wine per week    Comment: very seldom   Drug use: No   Sexual activity: Not Currently  Other Topics Concern   Not on file  Social History Narrative   Not on file   Social Determinants of Health   Financial Resource Strain: Not on file  Food Insecurity: Not on file  Transportation Needs: Not on file  Physical Activity: Not on file   Stress: Not on file  Social Connections: Not on file    Review of systems General: negative  for malaise, night sweats, fever, chills, weight los Neck: Negative for lumps, goiter, pain and significant neck swelling Resp: Negative for cough, wheezing, dyspnea at rest CV: Negative for chest pain, leg swelling, palpitations, orthopnea GI: denies melena, hematochezia, nausea, vomiting, diarrhea, constipation, dysphagia, odyonophagia, early satiety or unintentional weight loss.  MSK: Negative for joint pain or swelling, back pain, and muscle pain. Derm: Negative for itching or rash Psych: Denies depression, anxiety, memory loss, confusion. No homicidal or suicidal ideation.  Heme: Negative for prolonged bleeding, bruising easily, and swollen nodes. Endocrine: Negative for cold or heat intolerance, polyuria, polydipsia and goiter. Neuro: negative for tremor, gait imbalance, syncope and seizures. The remainder of the review of systems is noncontributory.  Physical Exam: BP (!) 151/81 (BP Location: Left Arm, Patient Position: Sitting, Cuff Size: Small)   Pulse 76   Temp (!) 97.5 F (36.4 C) (Temporal)   Ht 5' 3"  (1.6 m)   Wt 129 lb 11.2 oz (58.8 kg)   BMI 22.98 kg/m  General:   Alert and oriented. No distress noted. Pleasant and cooperative.  Head:  Normocephalic and atraumatic. Eyes:  Conjuctiva clear without scleral icterus. Mouth:  Oral mucosa pink and moist. Good dentition. No lesions. Heart: Normal rate and rhythm, s1 and s2 heart sounds present.  Lungs: Clear lung sounds in all lobes. Respirations equal and unlabored. Abdomen:  +BS, soft, non-tender and non-distended. No rebound or guarding. No HSM or masses noted. Rectal: crytal sutton, CMA present as witness, notably moderate sized external hemorrhoid at [place] location with stigmata of recent bleeding. Derm: No palmar erythema or jaundice Msk:  Symmetrical without gross deformities. Normal posture. Extremities:  Without  edema. Neurologic:  Alert and  oriented x4 Psych:  Alert and cooperative. Normal mood and affect.  Invalid input(s): "6 MONTHS"   ASSESSMENT: Jeanette Chang is a 81 y.o. female presenting today for rectal bleeding    PLAN:  Hemorrhoid compound  2. Follow up with hematology  3. Pt to make me aware if bleeding worsens or new associated symptoms    Follow Up: ***  Cheyene Hamric L. Alver Sorrow, MSN, APRN, AGNP-C Adult-Gerontology Nurse Practitioner [facility]

## 2021-12-23 DIAGNOSIS — K643 Fourth degree hemorrhoids: Secondary | ICD-10-CM | POA: Insufficient documentation

## 2021-12-23 DIAGNOSIS — K625 Hemorrhage of anus and rectum: Secondary | ICD-10-CM | POA: Insufficient documentation

## 2022-01-01 DIAGNOSIS — R197 Diarrhea, unspecified: Secondary | ICD-10-CM | POA: Diagnosis not present

## 2022-01-01 DIAGNOSIS — N281 Cyst of kidney, acquired: Secondary | ICD-10-CM | POA: Diagnosis not present

## 2022-01-01 DIAGNOSIS — R110 Nausea: Secondary | ICD-10-CM | POA: Diagnosis not present

## 2022-01-01 DIAGNOSIS — K6389 Other specified diseases of intestine: Secondary | ICD-10-CM | POA: Diagnosis not present

## 2022-01-01 DIAGNOSIS — R933 Abnormal findings on diagnostic imaging of other parts of digestive tract: Secondary | ICD-10-CM | POA: Diagnosis not present

## 2022-01-01 DIAGNOSIS — K766 Portal hypertension: Secondary | ICD-10-CM | POA: Diagnosis not present

## 2022-01-01 DIAGNOSIS — Z9049 Acquired absence of other specified parts of digestive tract: Secondary | ICD-10-CM | POA: Diagnosis not present

## 2022-01-01 DIAGNOSIS — R109 Unspecified abdominal pain: Secondary | ICD-10-CM | POA: Diagnosis not present

## 2022-01-01 DIAGNOSIS — E7800 Pure hypercholesterolemia, unspecified: Secondary | ICD-10-CM | POA: Diagnosis not present

## 2022-01-01 DIAGNOSIS — Z299 Encounter for prophylactic measures, unspecified: Secondary | ICD-10-CM | POA: Diagnosis not present

## 2022-01-01 DIAGNOSIS — D61818 Other pancytopenia: Secondary | ICD-10-CM | POA: Diagnosis not present

## 2022-01-01 DIAGNOSIS — I8500 Esophageal varices without bleeding: Secondary | ICD-10-CM | POA: Diagnosis not present

## 2022-01-01 DIAGNOSIS — Z90710 Acquired absence of both cervix and uterus: Secondary | ICD-10-CM | POA: Diagnosis not present

## 2022-01-01 DIAGNOSIS — Z6823 Body mass index (BMI) 23.0-23.9, adult: Secondary | ICD-10-CM | POA: Diagnosis not present

## 2022-01-01 DIAGNOSIS — N289 Disorder of kidney and ureter, unspecified: Secondary | ICD-10-CM | POA: Diagnosis not present

## 2022-01-01 DIAGNOSIS — D500 Iron deficiency anemia secondary to blood loss (chronic): Secondary | ICD-10-CM | POA: Diagnosis not present

## 2022-01-01 DIAGNOSIS — R161 Splenomegaly, not elsewhere classified: Secondary | ICD-10-CM | POA: Diagnosis not present

## 2022-01-01 DIAGNOSIS — K7460 Unspecified cirrhosis of liver: Secondary | ICD-10-CM | POA: Diagnosis not present

## 2022-01-01 DIAGNOSIS — I10 Essential (primary) hypertension: Secondary | ICD-10-CM | POA: Diagnosis not present

## 2022-01-01 DIAGNOSIS — K589 Irritable bowel syndrome without diarrhea: Secondary | ICD-10-CM | POA: Diagnosis not present

## 2022-01-12 ENCOUNTER — Telehealth (INDEPENDENT_AMBULATORY_CARE_PROVIDER_SITE_OTHER): Payer: Self-pay

## 2022-01-12 NOTE — Telephone Encounter (Signed)
Patient last seen here for Hemorrhoids on 12/22/2021. Patient daughter states patient ended up going to Dr. Trena Platt office on 01/01/2022 and he sent her over to [facility], where several tests were done. Ct scan showed Colitis and patient was started on Cipro 500 mg bid and finished this yesterday, patient was also given Metronidazole 500 mg TID and has one more dose of this to take. Patient is still having several episodes of diarrhea after every meal daily. She says she was told to stop bentyl while being treated for Colitis. Patient feeling better, but the daughter still thinks there is something else going on with the patient's gi system. Daughter wants to know whom patient should follow up with [place] or Dr Manuella Ghazi. She feels we should be the ones to see her. Per Mitzie the next available appointment with Vikki Ports is in February 2024. Please advise.

## 2022-01-12 NOTE — Telephone Encounter (Signed)
No diarrhea is about the same as it was when she was seen at the beginning of the month, and no dark or blood stools have been noticed. I have transferred the patient daughter Suanne Marker to Mitzie to set up with a app at the other office.

## 2022-01-13 ENCOUNTER — Encounter: Payer: Self-pay | Admitting: Gastroenterology

## 2022-01-13 ENCOUNTER — Ambulatory Visit (INDEPENDENT_AMBULATORY_CARE_PROVIDER_SITE_OTHER): Payer: Medicare Other | Admitting: Gastroenterology

## 2022-01-13 VITALS — BP 106/66 | HR 76 | Temp 97.1°F | Ht 63.0 in | Wt 130.0 lb

## 2022-01-13 DIAGNOSIS — I8510 Secondary esophageal varices without bleeding: Secondary | ICD-10-CM

## 2022-01-13 DIAGNOSIS — K529 Noninfective gastroenteritis and colitis, unspecified: Secondary | ICD-10-CM | POA: Diagnosis not present

## 2022-01-13 DIAGNOSIS — K7581 Nonalcoholic steatohepatitis (NASH): Secondary | ICD-10-CM

## 2022-01-13 DIAGNOSIS — K7460 Unspecified cirrhosis of liver: Secondary | ICD-10-CM | POA: Diagnosis not present

## 2022-01-13 NOTE — Progress Notes (Addendum)
Gastroenterology Office Note     Primary Care Physician:  Monico Blitz, MD  Primary Gastroenterologist: Dr. Jenetta Downer    Chief Complaint   Chief Complaint  Patient presents with   Abdominal Pain    Patient here today due to abd pain and diarrhea. Saw pcp on 01/01/2022, he sent her to have labs and scans. Was told she had colitis and was given Cipro 500 mg bid and Metronidazole 500 mg Tid, she finished the medication yesterday. She is having 4-5 loose stools per day, if she doesn't eat she only has 2 loose stools per day. Patient also given zofran to use prn.     History of Present Illness   Jeanette Chang is an 81 y.o. 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 Here today due to recent CT with colitis.    Diarrhea: previously, CTA not suggestive of chronic mesenteric ischemia. Cdiff and GI path negative in March 2023. Has taken dicyclomine ni the past. Worsening symptoms with abdominal pain and diarrhea in November. Notes she had been eating pre-cut fruit in [facility] that had been recalled.  CT abd/pelvis with contrast Nov 2023 at [facility]: moderate diffuse wall thickening of cecum, ascending colon, proximal transverse colon, mild to moderate diffuse wall thickening of more distal colon. Prescribed Cipro and Flagyl by PCP. She feels improved since completing the antibiotics. Much better than when seeing Dr. Manuella Ghazi. Postprandial looser stools/soft stool like "pudding". Improved consistency. Cramping better.   8/30 husband passed. Has   Cirrhosis: [place] up-to-date as of Sept 2023. No mental status changes, confusion. No overt GI bleeding. Seeing Hematology at [facility] on Friday.    Last EGD:06/17/2020 Grade II varices were found in the lower third of the esophagus. An area of irregularity was found in the posterior rim of the  pyloric channed. This was visualized with the aid of a transparent cap. I pulled the area with a cold forceps and was able to see a medium-sized - 1 cm, frond-like/villous, non-circumferential mass with no bleeding and no stigmata of recent bleeding was found at the pylorus. It had some adenomatous features - unclear if coming from pylorus or from duodenal bulb. One biopsy was taken with a cold forceps for histology, no more biopsies were performed as patient presented significant bleeding that stopped on its own. The examined duodenum and ampulla were normal.   Path: Granulation tissue neg for malignancy or HP.   Last Colonoscopy:06/2019 - two small cecal and SF polyps, one cecal 6 mm polyp. All polyps were TA. Hemorrhoids   Past Medical History:  Diagnosis Date   Abnormal LFTs    Achilles bursitis or tendinitis    Acute maxillary sinusitis    Acute pharyngitis    Anemia, unspecified    Candidiasis of skin and nails    Dermatophytosis of foot    Dermatophytosis of the body    Diverticulitis of colon (without mention of hemorrhage)(562.11)    Edema    Headache(784.0)    HOH (hard of hearing)    Malaise and fatigue    Occlusion and stenosis of carotid artery without mention of cerebral infarction    Osteoarthrosis, unspecified whether generalized or localized, unspecified site    Other dyspnea and respiratory abnormality    Other psoriasis    Other specified disorders of rotator cuff syndrome of shoulder and allied disorders  Pain in joint    Pure hypercholesterolemia    RUQ pain    Spasm of back muscles    Thrombocytopenia, unspecified (HCC)    Type II or unspecified type diabetes mellitus without mention of complication, not stated as uncontrolled    Unspecified essential hypertension    Unspecified sinusitis (chronic)    Urinary incontinence     Past Surgical History:  Procedure Laterality Date   APPENDECTOMY  1964   BIOPSY  03/02/2017   Procedure: BIOPSY;  Surgeon:  Rogene Houston, MD;  [facility];  Service: Endoscopy;;  antral   BIOPSY  06/17/2020   Procedure: BIOPSY;  Surgeon: Harvel Quale, MD;  [facility];  Service: Gastroenterology;;   CAROTID ENDARTERECTOMY  2005   Left CEA   CAROTID ENDARTERECTOMY  2009   Right CEA   CATARACT EXTRACTION, BILATERAL Bilateral    CHOLECYSTECTOMY  1982   Gall Bladder   COLONOSCOPY  09   COLONOSCOPY N/A 06/27/2019   Procedure: COLONOSCOPY;  Surgeon: Rogene Houston, MD;  [facility];  Service: Endoscopy;  Laterality: N/A;   ESOPHAGOGASTRODUODENOSCOPY N/A 03/02/2017   Procedure: ESOPHAGOGASTRODUODENOSCOPY (EGD);  Surgeon: Rogene Houston, MD;  [facility];  Service: Endoscopy;  Laterality: N/A;  12:55   ESOPHAGOGASTRODUODENOSCOPY N/A 06/27/2019   Procedure: ESOPHAGOGASTRODUODENOSCOPY (EGD);  Surgeon: Rogene Houston, MD;  [facility];  Service: Endoscopy;  Laterality: N/A;  730   ESOPHAGOGASTRODUODENOSCOPY (EGD) WITH PROPOFOL N/A 06/17/2020   Procedure: ESOPHAGOGASTRODUODENOSCOPY (EGD) WITH PROPOFOL;  Surgeon: Harvel Quale, MD;  [facility];  Service: Gastroenterology;  Laterality: N/A;  AM   GIVENS CAPSULE STUDY N/A 10/24/2019   Procedure: GIVENS CAPSULE STUDY;  Surgeon: Rogene Houston, MD;  [facility];  Service: Endoscopy;  Laterality: N/A;  730   KNEE ARTHROSCOPY WITH MEDIAL MENISECTOMY Left 07/10/2019   Procedure: KNEE ARTHROSCOPY WITH MEDIAL MENISCECTOMY;  Surgeon: Carole Civil, MD;  Location: [facility];  Service: Orthopedics;  Laterality: Left;   KNEE ARTHROSCOPY WITH MEDIAL MENISECTOMY Left 03/14/2020   Procedure: KNEE ARTHROSCOPY WITH MEDIAL MENISCECTOMY;  Surgeon: Carole Civil, MD;  Location: [facility];  Service: Orthopedics;  Laterality: Left;   NOSE SURGERY  1978   PARATHYROIDECTOMY  1992   POLYPECTOMY  03/02/2017   Procedure: POLYPECTOMY;  Surgeon: Rogene Houston, MD;  [facility];  Service: Endoscopy;;  gastric    POLYPECTOMY  06/27/2019   Procedure: POLYPECTOMY;  Surgeon: Rogene Houston, MD;  [facility];  Service: Endoscopy;;  colon   Power port  03/06/2009   Right upper chest    TONSILLECTOMY     TUMOR EXCISION Right August 24, 2013   [facility] Dr. Ellen Henri, ENT   VAGINAL HYSTERECTOMY  [phone]    Current Outpatient Medications  Medication Sig Dispense Refill   acetaminophen (TYLENOL) 500 MG tablet Take 1 tablet (500 mg total) by mouth every 6 (six) hours as needed for moderate pain or headache. 30 tablet 0   aspirin EC 81 MG tablet Take 81 mg by mouth every other day. AT NIGHT.     Biotin 10 MG CAPS Take by mouth daily at 6 (six) AM.     cholecalciferol (VITAMIN D) 25 MCG (1000 UNIT) tablet Take 1,000 Units by mouth daily.     dapagliflozin propanediol (FARXIGA) 5 MG TABS tablet Take 5 mg by mouth daily.     furosemide (  LASIX) 20 MG tablet Take 20 mg by mouth as needed.     gabapentin (NEURONTIN) 300 MG capsule Take 300 mg by mouth at bedtime.     metFORMIN (GLUCOPHAGE) 500 MG tablet Take 1,000 mg by mouth 2 (two) times daily with a meal.     OVER THE COUNTER MEDICATION Iron as needed. Not daily     OVER THE COUNTER MEDICATION Compounded Hemorrhoid cream [facility]) Apply rectally up to four times per day.     dicyclomine (BENTYL) 10 MG capsule Take 1 capsule (10 mg total) by mouth 3 (three) times daily before meals. (Patient not taking: Reported on 01/13/2022) 90 capsule 3   No current facility-administered medications for this visit.   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 Other (See Comments) 09/24/2010   Niacin and related Other (See Comments) 09/24/2010   Red yeast rice Other (See Comments) 09/24/2010   Actos [pioglitazone hydrochloride] Other (See Comments) 09/24/2010   Amaryl Nausea Only and Other (See Comments) 09/24/2010   Iron Diarrhea 04/10/2019   Cloyd Stagers [trospium chloride] Nausea Only and Other (See Comments) 09/24/2010   Statins Other (See Comments) 10/29/2014   Toviaz [fesoterodine fumarate] Other (See Comments) 09/24/2010   Norco [hydrocodone-acetaminophen] Nausea And Vomiting and Anxiety 06/13/2019   Penicillins Swelling, Rash, and Other (See Comments) 09/24/2010   Ultram [place] Mode hcl] Nausea And Vomiting 09/24/2010    Family History  Problem Relation Age of Onset   Heart attack Mother    Cancer Mother    Heart attack Father    Cancer Sister     Social History   Socioeconomic History   Marital status: Widowed    Spouse name: Not on file   Number of children: Not on file   Years of education: Not on file   Highest education level: Not on file  Occupational History   Not on file  Tobacco Use   Smoking status: Former    Packs/day: 1.00    Years: 42.00    Total pack years: 42.00    Types: Cigarettes    Quit date: 03/12/1992    Years since quitting: 29.8    Passive exposure: Past   Smokeless tobacco: Former    Quit date: 12/16/1992  Vaping Use   Vaping Use: Never used  Substance and Sexual Activity   Alcohol use: Yes    Alcohol/week: 1.0 standard drink of alcohol    Types: 1 Glasses of wine per week    Comment: very seldom   Drug use: No   Sexual activity: Not Currently  Other Topics Concern   Not on file  Social History Narrative   Not on file   Social Determinants of Health   Financial Resource Strain: Not on file  Food Insecurity: Not on file  Transportation Needs: Not on file  Physical Activity: Not on file  Stress: Not on file  Social Connections: Not on file  Intimate Partner Violence: Not on file      Review of Systems   Gen: Denies any fever, chills, fatigue, weight loss, lack of appetite.  CV: Denies chest pain, heart palpitations, peripheral edema, syncope.  Resp:  Denies shortness of breath at rest or with exertion. Denies wheezing or cough.  GI: see HPI GU : Denies urinary burning, urinary frequency, urinary hesitancy MS: Denies joint pain, muscle weakness, cramps, or limitation of movement.  Derm: Denies rash, itching, dry skin Psych: Denies depression, anxiety, memory loss, and confusion Heme: Denies bruising, bleeding, and enlarged lymph nodes.   Physical Exam   BP 106/66 (BP Location: Left Arm, Patient Position: Sitting, Cuff Size: Small)   Pulse 76   Temp (!) 97.1 F (36.2 C) (Temporal)   Ht 5' 3"  (1.6 m)   Wt 130 lb (59 kg)   BMI 23.03 kg/m  General:   Alert and oriented. Pleasant and cooperative. Well-nourished and well-developed.  Head:  Normocephalic and atraumatic. Eyes:  Without icterus Abdomen:  +BS, soft, mild TTP right-sided abdomen and non-distended. No HSM noted. No guarding or rebound. No masses appreciated.  Rectal:  Deferred  Msk:  Symmetrical without gross deformities. Normal posture. Extremities:  Without edema. Neurologic:  Alert and  oriented x4;  grossly normal neurologically. Skin:  Intact without significant lesions or rashes. Psych:  Alert and cooperative. Normal mood and affect.  Lab Results  Component Value Date   INR 1.1 10/27/2021   INR 1.1 10/10/2020   INR 1.1 04/17/2020   AFP tumor marker less than 1.8 in Sept 2023.     Assessment   Jeanette Chang is a delightful 81 y.o. 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 Here today due to recent CT with colitis.    Colitis: CT abd/pelvis with contrast Nov 2023 at [facility]: moderate diffuse wall thickening of cecum,  ascending colon, proximal transverse colon, mild to moderate diffuse wall thickening of more distal colon. Prescribed Cipro and Flagyl by PCP. Clinically has improved s/p antibiotics. Abdominal pain resolved and stool consistency improved. Seems to be back to baseline of loose stools. Discussed consideration of colonoscopy in about 6 weeks to directly visualize colon. I suspect she had infectious process after eating contaminated fruit. Holding off on stool studies as symptoms improve. However, at risk for Cdiff in light of abx exposure. Will call if worsening symptoms.  Diarrhea: I note since approximately Feb 2023. Dicyclomine with improvement in past. As she has improved from colitis episode, can resume dicyclomine. Unclear when metformin was started; this may be contributing.   Cirrhosis: compensated at this time with next [place] due in March 2023. Majority of visit was spent discussing colitis symptoms.   IDA: Hematology appt upcoming on Friday. Outside ferritin 240 in nov 2023. Hgb 11.4.      PLAN    Resume dicyclomine Low-fiber diet for now Call if worsening and will do stool studies 4 week follow-up and discuss colonoscopy [place] in March 2023   Annitta Needs, PhD, ANP-BC [facility] Gastroenterology   I have reviewed the note and agree with the APP's assessment as described in this progress note  Abdominal pain nad diarrhea improved with antibiotics. 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 Will monitor for now  [name], MD Gastroenterology and Hepatology [facility] Gastroenterology

## 2022-01-13 NOTE — Patient Instructions (Signed)
You can start back taking dicyclomine before meals and at bedtime, no more than 4 times a day.  Follow a low-fiber/low-residue diet for now.  I would like to see you back in 4 weeks! Please call if worsening symptoms.  We will discuss a colonoscopy at next visit!  It was a pleasure to see you today. I want to create trusting relationships with patients to provide genuine, compassionate, and quality care. I value your feedback. If you receive a survey regarding your visit,  I greatly appreciate you taking time to fill this out.   Annitta Needs, PhD, ANP-BC [facility]astroenterology

## 2022-01-14 NOTE — Telephone Encounter (Signed)
Patient was seen by Marlyn Corporal 01/13/2022. Do we still need to do this?

## 2022-01-15 ENCOUNTER — Inpatient Hospital Stay: Payer: Medicare Other

## 2022-01-15 ENCOUNTER — Inpatient Hospital Stay: Payer: Medicare Other | Attending: Hematology | Admitting: Hematology

## 2022-01-15 VITALS — BP 158/79 | HR 77 | Temp 98.4°F | Resp 18 | Ht 63.0 in | Wt 130.6 lb

## 2022-01-15 DIAGNOSIS — Z809 Family history of malignant neoplasm, unspecified: Secondary | ICD-10-CM | POA: Diagnosis not present

## 2022-01-15 DIAGNOSIS — Z79899 Other long term (current) drug therapy: Secondary | ICD-10-CM | POA: Diagnosis not present

## 2022-01-15 DIAGNOSIS — D61818 Other pancytopenia: Secondary | ICD-10-CM | POA: Diagnosis not present

## 2022-01-15 DIAGNOSIS — D649 Anemia, unspecified: Secondary | ICD-10-CM | POA: Insufficient documentation

## 2022-01-15 DIAGNOSIS — D509 Iron deficiency anemia, unspecified: Secondary | ICD-10-CM | POA: Diagnosis not present

## 2022-01-15 DIAGNOSIS — D631 Anemia in chronic kidney disease: Secondary | ICD-10-CM | POA: Insufficient documentation

## 2022-01-15 DIAGNOSIS — Z87891 Personal history of nicotine dependence: Secondary | ICD-10-CM | POA: Diagnosis not present

## 2022-01-15 DIAGNOSIS — N189 Chronic kidney disease, unspecified: Secondary | ICD-10-CM | POA: Diagnosis not present

## 2022-01-15 DIAGNOSIS — D696 Thrombocytopenia, unspecified: Secondary | ICD-10-CM

## 2022-01-15 LAB — CBC WITH DIFFERENTIAL/PLATELET
Abs Immature Granulocytes: 0 10*3/uL (ref 0.00–0.07)
Basophils Absolute: 0 10*3/uL (ref 0.0–0.1)
Basophils Relative: 1 %
Eosinophils Absolute: 0 10*3/uL (ref 0.0–0.5)
Eosinophils Relative: 1 %
HCT: 33.8 % — ABNORMAL LOW (ref 36.0–46.0)
Hemoglobin: 10.7 g/dL — ABNORMAL LOW (ref 12.0–15.0)
Immature Granulocytes: 0 %
Lymphocytes Relative: 32 %
Lymphs Abs: 0.6 10*3/uL — ABNORMAL LOW (ref 0.7–4.0)
MCH: 30.9 pg (ref 26.0–34.0)
MCHC: 31.7 g/dL (ref 30.0–36.0)
MCV: 97.7 fL (ref 80.0–100.0)
Monocytes Absolute: 0.1 10*3/uL (ref 0.1–1.0)
Monocytes Relative: 8 %
Neutro Abs: 1.1 10*3/uL — ABNORMAL LOW (ref 1.7–7.7)
Neutrophils Relative %: 58 %
Platelets: 61 10*3/uL — ABNORMAL LOW (ref 150–400)
RBC: 3.46 MIL/uL — ABNORMAL LOW (ref 3.87–5.11)
RDW: 18 % — ABNORMAL HIGH (ref 11.5–15.5)
Smear Review: DECREASED
WBC: 1.9 10*3/uL — ABNORMAL LOW (ref 4.0–10.5)
nRBC: 0 % (ref 0.0–0.2)

## 2022-01-15 LAB — FOLATE: Folate: 15.7 ng/mL (ref >5.9)

## 2022-01-15 LAB — RETICULOCYTES
Immature Retic Fract: 19.9 % — ABNORMAL HIGH (ref 2.3–15.9)
RBC.: 3.44 MIL/uL — ABNORMAL LOW (ref 3.87–5.11)
Retic Count, Absolute: 75.7 10*3/uL (ref 19.0–186.0)
Retic Ct Pct: 2.2 % (ref 0.4–3.1)

## 2022-01-15 LAB — LACTATE DEHYDROGENASE: LDH: 164 U/L (ref 98–192)

## 2022-01-15 LAB — VITAMIN B12: Vitamin B-12: 697 pg/mL (ref 180–914)

## 2022-01-15 LAB — IRON AND TIBC
Iron: 61 ug/dL (ref 28–170)
Saturation Ratios: 22 % (ref 10.4–31.8)
TIBC: 284 ug/dL (ref 250–450)
UIBC: 223 ug/dL

## 2022-01-15 LAB — FERRITIN: Ferritin: 104 ng/mL (ref 11–307)

## 2022-01-15 NOTE — Progress Notes (Signed)
CONSULT NOTE  Patient Care Team: Monico Blitz, MD as PCP - General (Internal Medicine) Jacquiline Doe, Marko Stai, MD (Hematology and Oncology) Dorthula Rue., MD as Referring Physician (Otolaryngology)  CHIEF COMPLAINTS/PURPOSE OF CONSULTATION:  Pancytopenia and normocytic anemia  HISTORY OF PRESENTING ILLNESS:  Jeanette Chang 81 y.o. female is seen in consultation today for further workup and management of normocytic anemia and pancytopenia.  She has a history of Karlene Lineman cirrhosis and also intermittent GI blood loss.  Recently her hemoglobin was 9.4 in October and received 2 units of PRBC on 12/02/2021 and 12/08/2021.  She was also receiving intermittent parenteral iron therapy.  She had a bout of recent colitis which made her weak for the last 2 weeks.  Denies any bleeding per rectum or melena.  Denies any ice pica.  Reports energy levels of 60%.  Occasional nausea stable.  Her daughter lives with her at home and patient is independent of ADLs and IADLs.    MEDICAL HISTORY:  Past Medical History:  Diagnosis Date   Abnormal LFTs    Achilles bursitis or tendinitis    Acute maxillary sinusitis    Acute pharyngitis    Anemia, unspecified    Candidiasis of skin and nails    Dermatophytosis of foot    Dermatophytosis of the body    Diverticulitis of colon (without mention of hemorrhage)(562.11)    Edema    Headache(784.0)    HOH (hard of hearing)    Malaise and fatigue    Occlusion and stenosis of carotid artery without mention of cerebral infarction    Osteoarthrosis, unspecified whether generalized or localized, unspecified site    Other dyspnea and respiratory abnormality    Other psoriasis    Other specified disorders of rotator cuff syndrome of shoulder and allied disorders    Pain in joint    Pure hypercholesterolemia    RUQ pain    Spasm of back muscles    Thrombocytopenia, unspecified ([place])    Type II or unspecified type diabetes mellitus without mention of complication, not  stated as uncontrolled    Unspecified essential hypertension    Unspecified sinusitis (chronic)    Urinary incontinence     SURGICAL HISTORY: Past Surgical History:  Procedure Laterality Date   APPENDECTOMY  1964   BIOPSY  03/02/2017   Procedure: BIOPSY;  Surgeon: Rogene Houston, MD;  [facility];  Service: Endoscopy;;  antral   BIOPSY  06/17/2020   Procedure: BIOPSY;  Surgeon: Harvel Quale, MD;  [facility];  Service: Gastroenterology;;   CAROTID ENDARTERECTOMY  2005   Left CEA   CAROTID ENDARTERECTOMY  2009   Right CEA   CATARACT EXTRACTION, BILATERAL Bilateral    CHOLECYSTECTOMY  1982   Gall Bladder   COLONOSCOPY  09   COLONOSCOPY N/A 06/27/2019   Procedure: COLONOSCOPY;  Surgeon: Rogene Houston, MD;  [facility];  Service: Endoscopy;  Laterality: N/A;   ESOPHAGOGASTRODUODENOSCOPY N/A 03/02/2017   Procedure: ESOPHAGOGASTRODUODENOSCOPY (EGD);  Surgeon: Rogene Houston, MD;  [facility];  Service: Endoscopy;  Laterality: N/A;  12:55   ESOPHAGOGASTRODUODENOSCOPY N/A 06/27/2019   Procedure: ESOPHAGOGASTRODUODENOSCOPY (EGD);  Surgeon: Rogene Houston, MD;  [facility];  Service: Endoscopy;  Laterality: N/A;  730   ESOPHAGOGASTRODUODENOSCOPY (EGD) WITH PROPOFOL N/A 06/17/2020   Procedure: ESOPHAGOGASTRODUODENOSCOPY (EGD) WITH PROPOFOL;  Surgeon: Harvel Quale, MD;  [facility];  Service: Gastroenterology;  Laterality: N/A;  AM  GIVENS CAPSULE STUDY N/A 10/24/2019   Procedure: GIVENS CAPSULE STUDY;  Surgeon: Rogene Houston, MD;  [facility];  Service: Endoscopy;  Laterality: N/A;  730   KNEE ARTHROSCOPY WITH MEDIAL MENISECTOMY Left 07/10/2019   Procedure: KNEE ARTHROSCOPY WITH MEDIAL MENISCECTOMY;  Surgeon: Carole Civil, MD;  Location: [facility];  Service: Orthopedics;  Laterality: Left;   KNEE ARTHROSCOPY WITH MEDIAL MENISECTOMY Left 03/14/2020   Procedure: KNEE ARTHROSCOPY  WITH MEDIAL MENISCECTOMY;  Surgeon: Carole Civil, MD;  [facility];  Service: Orthopedics;  Laterality: Left;   NOSE SURGERY  1978   PARATHYROIDECTOMY  1992   POLYPECTOMY  03/02/2017   Procedure: POLYPECTOMY;  Surgeon: Rogene Houston, MD;  [facility];  Service: Endoscopy;;  gastric    POLYPECTOMY  06/27/2019   Procedure: POLYPECTOMY;  Surgeon: Rogene Houston, MD;  [facility];  Service: Endoscopy;;  colon   Power port  03/06/2009   Right upper chest    TONSILLECTOMY     TUMOR EXCISION Right August 24, 2013   [facility] Dr. Ellen Henri, ENT   VAGINAL HYSTERECTOMY  [phone]    SOCIAL HISTORY: Social History   Socioeconomic History   Marital status: Widowed    Spouse name: Not on file   Number of children: Not on file   Years of education: Not on file   Highest education level: Not on file  Occupational History   Not on file  Tobacco Use   Smoking status: Former    Packs/day: 1.00    Years: 42.00    Total pack years: 42.00    Types: Cigarettes    Quit date: 03/12/1992    Years since quitting: 29.8    Passive exposure: Past   Smokeless tobacco: Former    Quit date: 12/16/1992  Vaping Use   Vaping Use: Never used  Substance and Sexual Activity   Alcohol use: Yes    Alcohol/week: 1.0 standard drink of alcohol    Types: 1 Glasses of wine per week    Comment: very seldom   Drug use: No   Sexual activity: Not Currently  Other Topics Concern   Not on file  Social History Narrative   Not on file   Social Determinants of Health   Financial Resource Strain: Not on file  Food Insecurity: Not on file  Transportation Needs: Not on file  Physical Activity: Not on file  Stress: Not on file  Social Connections: Not on file  Intimate Partner Violence: Not on file    FAMILY HISTORY: Family History  Problem Relation Age of Onset   Heart attack Mother    Cancer Mother    Heart attack Father    Cancer Sister      ALLERGIES:  is allergic to codeine, monascus purpureus went yeast, niacin and related, red yeast rice, actos [pioglitazone hydrochloride], amaryl, iron, sanctura [trospium chloride], statins, toviaz [fesoterodine fumarate], norco [hydrocodone-acetaminophen], penicillins, and ultram [tramadol hcl].  MEDICATIONS:  Current Outpatient Medications  Medication Sig Dispense Refill   acetaminophen (TYLENOL) 500 MG tablet Take 1 tablet (500 mg total) by mouth every 6 (six) hours as needed for moderate pain or headache. 30 tablet 0   aspirin EC 81 MG tablet Take 81 mg by mouth every other day. AT NIGHT.     Biotin 10 MG CAPS Take by mouth daily at 6 (six) AM.     cholecalciferol (VITAMIN D) 25 MCG (1000 UNIT) tablet Take 1,000  Units by mouth daily.     dapagliflozin propanediol (FARXIGA) 5 MG TABS tablet Take 5 mg by mouth daily.     dicyclomine (BENTYL) 10 MG capsule Take 1 capsule (10 mg total) by mouth 3 (three) times daily before meals. 90 capsule 3   furosemide (LASIX) 20 MG tablet Take 20 mg by mouth as needed.     gabapentin (NEURONTIN) 300 MG capsule Take 300 mg by mouth at bedtime.     lisinopril (ZESTRIL) 40 MG tablet Take 40 mg by mouth daily.     metFORMIN (GLUCOPHAGE) 500 MG tablet Take 1,000 mg by mouth 2 (two) times daily with a meal.     OVER THE COUNTER MEDICATION Iron as needed. Not daily     OVER THE COUNTER MEDICATION Compounded Hemorrhoid cream [facility]) Apply rectally up to four times per day.     No current facility-administered medications for this visit.   Facility-Administered Medications Ordered in Other Visits  Medication Dose Route Frequency Provider Last Rate Last Admin   sodium chloride irrigation 0.9 %    PRN Carole Civil, MD   1,000 mL at 03/14/20 0945    REVIEW OF SYSTEMS:   Constitutional: Denies fevers, chills or abnormal night sweats Eyes: Denies blurriness of vision, double vision or watery eyes Ears, nose, mouth, throat, and face:  Denies mucositis or sore throat Respiratory: Denies cough, dyspnea or wheezes Cardiovascular: Denies palpitation, chest discomfort or lower extremity swelling Gastrointestinal:  Denies heartburn or change in bowel habits.  Positive for nausea. Skin: Denies abnormal skin rashes Lymphatics: Denies new lymphadenopathy or easy bruising Neurological:Denies numbness, tingling or new weaknesses Behavioral/Psych: Mood is stable, no new changes  All other systems were reviewed with the patient and are negative.  PHYSICAL EXAMINATION: ECOG PERFORMANCE STATUS: 1 - Symptomatic but completely ambulatory  Vitals:   01/15/22 1100  BP: (!) 158/79  Pulse: 77  Resp: 18  Temp: 98.4 F (36.9 C)  SpO2: 98%   Filed Weights   01/15/22 1100  Weight: 130 lb 9.6 oz (59.2 kg)    GENERAL:alert, no distress and comfortable SKIN: skin color, texture, turgor are normal, no rashes or significant lesions EYES: normal, conjunctiva are pink and non-injected, sclera clear OROPHARYNX:no exudate, no erythema and lips, buccal mucosa, and tongue normal  NECK: supple, thyroid normal size, non-tender, without nodularity LYMPH:  no palpable lymphadenopathy in the cervical, axillary or inguinal LUNGS: clear to auscultation and percussion with normal breathing effort HEART: regular rate & rhythm and no murmurs and no lower extremity edema ABDOMEN:abdomen soft, non-tender and normal bowel sounds Musculoskeletal:no cyanosis of digits and no clubbing  PSYCH: alert & oriented x 3 with fluent speech NEURO: no focal motor/sensory deficits  LABORATORY DATA:  I have reviewed the data as listed Recent Results (from the past 2160 hour(s))  AFP tumor marker     Status: None   Collection Time: 10/27/21 11:08 AM  Result Value Ref Range   AFP, Serum, Tumor Marker <1.8 0.0 - 8.7 ng/mL    Comment: [employer] Electrochemiluminescence Immunoassay (ECLIA) Values obtained with different assay methods or kits cannot be used  interchangeably.  Results cannot be interpreted as absolute evidence of the presence or absence of malignant disease. This test is not interpretable in pregnant females.   INR/PT     Status: None   Collection Time: 10/27/21 11:08 AM  Result Value Ref Range   INR 1.1 0.9 - 1.2    Comment: Reference interval is for non-anticoagulated  patients. Suggested INR therapeutic range for Vitamin K antagonist therapy:    Standard Dose (moderate intensity                   therapeutic range):       2.0 - 3.0    Higher intensity therapeutic range       2.5 - 3.5    Prothrombin Time 12.0 9.1 - 12.0 sec  Folate     Status: None   Collection Time: 01/15/22 11:26 AM  Result Value Ref Range   Folate 15.7 >5.9 ng/mL    Comment: Performed at [facility], [address], [place], [place] [address]  Vitamin B12     Status: None   Collection Time: 01/15/22 11:26 AM  Result Value Ref Range   Vitamin B-12 697 180 - 914 pg/mL    Comment: (NOTE) This assay is not validated for testing neonatal or myeloproliferative syndrome specimens for Vitamin B12 levels. Performed at [facility], [address], [place], [place] [address]   Ferritin     Status: None   Collection Time: 01/15/22 11:26 AM  Result Value Ref Range   Ferritin 104 11 - 307 ng/mL    Comment: Performed at [facility], [address], [place], [place] [address]  Iron and TIBC [facility] ONLY)     Status: None   Collection Time: 01/15/22 11:26 AM  Result Value Ref Range   Iron 61 28 - 170 ug/dL   TIBC 284 250 - 450 ug/dL   Saturation Ratios 22 10.4 - 31.8 %   UIBC 223 ug/dL    Comment: Performed at [facility], [address], [place], [place] [address]  Lactate dehydrogenase     Status: None   Collection Time: 01/15/22 11:26 AM  Result Value Ref Range   LDH 164 98 - 192 U/L    Comment: Performed at [facility], [address], [place], [place] [address]  CBC with Differential     Status: Abnormal   Collection Time:  01/15/22 11:26 AM  Result Value Ref Range   WBC 1.9 (L) 4.0 - 10.5 K/uL   RBC 3.46 (L) 3.87 - 5.11 MIL/uL   Hemoglobin 10.7 (L) 12.0 - 15.0 g/dL   HCT 33.8 (L) 36.0 - 46.0 %   MCV 97.7 80.0 - 100.0 fL   MCH 30.9 26.0 - 34.0 pg   MCHC 31.7 30.0 - 36.0 g/dL   RDW 18.0 (H) 11.5 - 15.5 %   Platelets 61 (L) 150 - 400 K/uL    Comment: SPECIMEN CHECKED FOR CLOTS PLATELET COUNT CONFIRMED BY SMEAR    nRBC 0.0 0.0 - 0.2 %   Neutrophils Relative % 58 %   Neutro Abs 1.1 (L) 1.7 - 7.7 K/uL   Lymphocytes Relative 32 %   Lymphs Abs 0.6 (L) 0.7 - 4.0 K/uL   Monocytes Relative 8 %   Monocytes Absolute 0.1 0.1 - 1.0 K/uL   Eosinophils Relative 1 %   Eosinophils Absolute 0.0 0.0 - 0.5 K/uL   Basophils Relative 1 %   Basophils Absolute 0.0 0.0 - 0.1 K/uL   WBC Morphology MORPHOLOGY UNREMARKABLE    RBC Morphology ANISO, HYPO    Smear Review PLATELETS APPEAR DECREASED    Immature Granulocytes 0 %   Abs Immature Granulocytes 0.00 0.00 - 0.07 K/uL    Comment: Performed at [facility], [address] [place], [place] [address]  Reticulocytes     Status: Abnormal   Collection Time: 01/15/22  11:27 AM  Result Value Ref Range   Retic Ct Pct 2.2 0.4 - 3.1 %   RBC. 3.44 (L) 3.87 - 5.11 MIL/uL   Retic Count, Absolute 75.7 19.0 - 186.0 K/uL   Immature Retic Fract 19.9 (H) 2.3 - 15.9 %    Comment: Performed at [facility], [address] [place], [place] [address]    RADIOGRAPHIC STUDIES: I have personally reviewed the radiological images as listed and agreed with the findings in the report. No results found.  ASSESSMENT:  1.  Pancytopenia and normocytic anemia: - Patient seen at the request of Scherrie Gerlach, NP - CBC (01/01/2022): Hemoglobin 11.4, PLT 51. 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  Normal duodenum. - Colonoscopy (06/29/2019): Perianal skin tags found on perianal exam.  2 small polyps at  the splenic flexure.  External hemorrhoids. - BMBX (09/09/2020): Normocellular marrow (20%) with TLH, including minimal dyserythropoiesis and 1% blasts by manual aspirate differential.  Mild dyserythropoiesis which does not fulfill criteria for MDS.  Karyotype 45, X, -X[4]/46, XX[16], loss of 1 X chromosome is rare finding in the bone marrow is of older woman and when present as a minor clone (less than 50% of 20 metaphases) is thought to be most likely aging effect. - CT [facility] (04/24/2021): Spleen enlarged measuring 14.1 cm with volume 632 mL.  Liver has nodular contour consistent with cirrhosis. - CBC (11/27/2021): Hemoglobin 9.4. - She reportedly received 1 unit PRBC on 12/02/2021 and 12/08/2021. - She was also receiving intermittent IV iron. - She denies any bleeding per rectum or melena.  2.  Social/family history: - Her daughter lives with her at home.  She is independent of ADLs and IADLs.  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  Quit smoking in November 1992. - Maternal grandmother had low platelets.  Sister had low platelet. - Mother had colon cancer.  Sister had stomach cancer.  Paternal uncle had lung cancer.  PLAN:  1.  Normocytic anemia: - Likely multifactorial anemia from blood loss, relative iron deficiency and CKD. - Will check CBC today with ferritin, iron panel, N46, folic acid, MMA, copper. - Will also check for hemolysis with LDH, reticulocyte count.  Will check for bone marrow infiltrative process with SPEP and free light chains. - Will see her back in 1 week to see if she needs any further IV infusions of iron.  All questions were answered. The patient knows to call the clinic with any problems, questions or concerns.      Derek Jack, MD 01/15/22 4:43 PM

## 2022-01-15 NOTE — Patient Instructions (Signed)
[place] at [facility] Discharge Instructions   You were seen and examined today by Dr. Delton Coombes. He is a blood specialist who is seeing you today for low platelets and anemia.  We will check blood work today.   Return as scheduled.    Thank you for choosing [place] at [facility] to provide your oncology and hematology care.  To afford each patient quality time with our provider, please arrive at least 15 minutes before your scheduled appointment time.   If you have a lab appointment with the [facility] please come in thru the Main Entrance and check in at the main information desk.  You need to re-schedule your appointment should you arrive 10 or more minutes late.  We strive to give you quality time with our providers, and arriving late affects you and other patients whose appointments are after yours.  Also, if you no show three or more times for appointments you may be dismissed from the clinic at the providers discretion.     Again, thank you for choosing [facility].  Our hope is that these requests will decrease the amount of time that you wait before being seen by our physicians.       _____________________________________________________________  Should you have questions after your visit to [facility], please contact our office at [phone] and follow the prompts.  Our office hours are 8:00 a.m. and 4:30 p.m. Monday - Friday.  Please note that voicemails left after 4:00 p.m. may not be returned until the following business day.  We are closed weekends and major holidays.  You do have access to a nurse 24-7, just call the main number to the clinic [phone] and do not press any options, hold on the line and a nurse will answer the phone.    For prescription refill requests, have your pharmacy contact our office and allow 72 hours.    Due to Covid, you will need to wear a mask upon entering the  hospital. If you do not have a mask, a mask will be given to you at the Main Entrance upon arrival. For doctor visits, patients may have 1 support person age 83 or older with them. For treatment visits, patients can not have anyone with them due to social distancing guidelines and our immunocompromised population.

## 2022-01-18 LAB — KAPPA/LAMBDA LIGHT CHAINS
Kappa free light chain: 59.8 mg/L — ABNORMAL HIGH (ref 3.3–19.4)
Kappa, lambda light chain ratio: 1.71 — ABNORMAL HIGH (ref 0.26–1.65)
Lambda free light chains: 35 mg/L — ABNORMAL HIGH (ref 5.7–26.3)

## 2022-01-19 LAB — PROTEIN ELECTROPHORESIS, SERUM
A/G Ratio: 1.2 (ref 0.7–1.7)
Albumin ELP: 3.5 g/dL (ref 2.9–4.4)
Alpha-1-Globulin: 0.3 g/dL (ref 0.0–0.4)
Alpha-2-Globulin: 0.6 g/dL (ref 0.4–1.0)
Beta Globulin: 1.1 g/dL (ref 0.7–1.3)
Gamma Globulin: 0.9 g/dL (ref 0.4–1.8)
Globulin, Total: 2.9 g/dL (ref 2.2–3.9)
Total Protein ELP: 6.4 g/dL (ref 6.0–8.5)

## 2022-01-20 LAB — METHYLMALONIC ACID, SERUM: Methylmalonic Acid, Quantitative: 202 nmol/L (ref 0–378)

## 2022-01-21 ENCOUNTER — Inpatient Hospital Stay (HOSPITAL_BASED_OUTPATIENT_CLINIC_OR_DEPARTMENT_OTHER): Payer: Medicare Other | Admitting: Hematology

## 2022-01-21 VITALS — BP 146/45 | HR 85 | Temp 98.1°F | Resp 17 | Ht 63.0 in | Wt 128.0 lb

## 2022-01-21 DIAGNOSIS — Z79899 Other long term (current) drug therapy: Secondary | ICD-10-CM | POA: Diagnosis not present

## 2022-01-21 DIAGNOSIS — D696 Thrombocytopenia, unspecified: Secondary | ICD-10-CM | POA: Diagnosis not present

## 2022-01-21 DIAGNOSIS — D649 Anemia, unspecified: Secondary | ICD-10-CM | POA: Diagnosis not present

## 2022-01-21 DIAGNOSIS — N189 Chronic kidney disease, unspecified: Secondary | ICD-10-CM | POA: Diagnosis not present

## 2022-01-21 DIAGNOSIS — D631 Anemia in chronic kidney disease: Secondary | ICD-10-CM | POA: Diagnosis not present

## 2022-01-21 DIAGNOSIS — D61818 Other pancytopenia: Secondary | ICD-10-CM | POA: Diagnosis not present

## 2022-01-21 DIAGNOSIS — D509 Iron deficiency anemia, unspecified: Secondary | ICD-10-CM | POA: Diagnosis not present

## 2022-01-21 NOTE — Progress Notes (Signed)
[place], [place] [address]   CLINIC:  Medical Oncology/Hematology  PCP:  Monico Blitz, MD [place] [address] [phone]   REASON FOR VISIT:  Follow-up for pancytopenia normocytic anemia  PRIOR THERAPY: Intermittent parenteral iron  CURRENT THERAPY: Feraheme  INTERVAL HISTORY:  Jeanette Chang 81 y.o. female seen for follow-up of pancytopenia and normocytic anemia.  She was evaluated by me on 01/15/2022 for further workup and management of anemia from blood loss, iron deficiency and CKD.  She reports energy levels of around 70%.  Intermittent nausea also present.    REVIEW OF SYSTEMS:  Review of Systems  Gastrointestinal:  Positive for nausea.  All other systems reviewed and are negative.    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    Osteoarthrosis, unspecified whether generalized or localized, unspecified site    Other dyspnea and respiratory abnormality    Other psoriasis    Other specified disorders of rotator cuff syndrome of shoulder and allied disorders    Pain in joint    Pure hypercholesterolemia    RUQ pain    Spasm of back muscles    Thrombocytopenia, unspecified ([place])    Type II or unspecified type diabetes mellitus without mention of complication, not stated as uncontrolled    Unspecified essential hypertension    Unspecified sinusitis (chronic)    Urinary incontinence    Past Surgical History:  Procedure Laterality Date    APPENDECTOMY  1964   BIOPSY  03/02/2017   Procedure: BIOPSY;  Surgeon: Rogene Houston, MD;  [facility];  Service: Endoscopy;;  antral   BIOPSY  06/17/2020   Procedure: BIOPSY;  Surgeon: Harvel Quale, MD;  [facility];  Service: Gastroenterology;;   CAROTID ENDARTERECTOMY  2005   Left CEA   CAROTID ENDARTERECTOMY  2009   Right CEA   CATARACT EXTRACTION, BILATERAL Bilateral    CHOLECYSTECTOMY  1982   Gall Bladder   COLONOSCOPY  09   COLONOSCOPY N/A 06/27/2019   Procedure: COLONOSCOPY;  Surgeon: Rogene Houston, MD;  [facility];  Service: Endoscopy;  Laterality: N/A;   ESOPHAGOGASTRODUODENOSCOPY N/A 03/02/2017   Procedure: ESOPHAGOGASTRODUODENOSCOPY (EGD);  Surgeon: Rogene Houston, MD;  [facility];  Service: Endoscopy;  Laterality: N/A;  12:55   ESOPHAGOGASTRODUODENOSCOPY N/A 06/27/2019   Procedure: ESOPHAGOGASTRODUODENOSCOPY (EGD);  Surgeon: Rogene Houston, MD;  [facility];  Service: Endoscopy;  Laterality: N/A;  730   ESOPHAGOGASTRODUODENOSCOPY (EGD) WITH PROPOFOL N/A 06/17/2020   Procedure: ESOPHAGOGASTRODUODENOSCOPY (EGD) WITH PROPOFOL;  Surgeon: Harvel Quale, MD;  [facility];  Service: Gastroenterology;  Laterality: N/A;  AM   GIVENS CAPSULE STUDY N/A 10/24/2019   Procedure: GIVENS CAPSULE STUDY;  Surgeon: Rogene Houston, MD;  [facility];  Service: Endoscopy;  Laterality: N/A;  730   KNEE ARTHROSCOPY WITH MEDIAL MENISECTOMY Left  07/10/2019   Procedure: KNEE ARTHROSCOPY WITH MEDIAL MENISCECTOMY;  Surgeon: Carole Civil, MD;  Location: [facility];  Service: Orthopedics;  Laterality: Left;   KNEE ARTHROSCOPY WITH MEDIAL MENISECTOMY Left 03/14/2020   Procedure: KNEE ARTHROSCOPY WITH MEDIAL MENISCECTOMY;  Surgeon: Carole Civil, MD;  [facility];  Service: Orthopedics;  Laterality: Left;   NOSE SURGERY  1978   PARATHYROIDECTOMY  1992   POLYPECTOMY  03/02/2017    Procedure: POLYPECTOMY;  Surgeon: Rogene Houston, MD;  [facility];  Service: Endoscopy;;  gastric    POLYPECTOMY  06/27/2019   Procedure: POLYPECTOMY;  Surgeon: Rogene Houston, MD;  [facility];  Service: Endoscopy;;  colon   Power port  03/06/2009   Right upper chest    TONSILLECTOMY     TUMOR EXCISION Right August 24, 2013   [facility] Dr. Ellen Henri, ENT   VAGINAL HYSTERECTOMY  [phone]     SOCIAL HISTORY:  Social History   Socioeconomic History   Marital status: Widowed    Spouse name: Not on file   Number of children: Not on file   Years of education: Not on file   Highest education level: Not on file  Occupational History   Not on file  Tobacco Use   Smoking status: Former    Packs/day: 1.00    Years: 42.00    Total pack years: 42.00    Types: Cigarettes    Quit date: 03/12/1992    Years since quitting: 29.8    Passive exposure: Past   Smokeless tobacco: Former    Quit date: 12/16/1992  Vaping Use   Vaping Use: Never used  Substance and Sexual Activity   Alcohol use: Yes    Alcohol/week: 1.0 standard drink of alcohol    Types: 1 Glasses of wine per week    Comment: very seldom   Drug use: No   Sexual activity: Not Currently  Other Topics Concern   Not on file  Social History Narrative   Not on file   Social Determinants of Health   Financial Resource Strain: Not on file  Food Insecurity: Not on file  Transportation Needs: Not on file  Physical Activity: Not on file  Stress: Not on file  Social Connections: Not on file  Intimate Partner Violence: Not on file    FAMILY HISTORY:  Family History  Problem Relation Age of Onset   Heart attack Mother    Cancer Mother    Heart attack Father    Cancer Sister     CURRENT MEDICATIONS:  Outpatient Encounter Medications as of 01/21/2022  Medication Sig   acetaminophen (TYLENOL) 500 MG tablet Take 1 tablet (500 mg total) by mouth every 6 (six) hours as needed for  moderate pain or headache.   aspirin EC 81 MG tablet Take 81 mg by mouth every other day. AT NIGHT.   Biotin 10 MG CAPS Take by mouth daily at 6 (six) AM.   cholecalciferol (VITAMIN D) 25 MCG (1000 UNIT) tablet Take 1,000 Units by mouth daily.   dapagliflozin propanediol (FARXIGA) 5 MG TABS tablet Take 5 mg by mouth daily.   dicyclomine (BENTYL) 10 MG capsule Take 1 capsule (10 mg total) by mouth 3 (three) times daily before meals.   furosemide (LASIX) 20 MG tablet Take 20 mg by mouth as needed.   gabapentin (NEURONTIN) 300 MG capsule Take 300 mg by mouth at bedtime.   lisinopril (ZESTRIL) 40 MG tablet Take  40 mg by mouth daily.   metFORMIN (GLUCOPHAGE) 500 MG tablet Take 1,000 mg by mouth 2 (two) times daily with a meal.   OVER THE COUNTER MEDICATION Iron as needed. Not daily   OVER THE COUNTER MEDICATION Compounded Hemorrhoid cream [facility]) Apply rectally up to four times per day.   Facility-Administered Encounter Medications as of 01/21/2022  Medication   sodium chloride irrigation 0.9 %    ALLERGIES:  Allergies  Allergen Reactions   Codeine Nausea And Vomiting and Rash   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   Toviaz [Fesoterodine Fumarate] Other (See Comments)    Cramps, severe headache   Norco [Hydrocodone-Acetaminophen] Nausea And Vomiting and Anxiety   Penicillins Swelling, Rash and Other  (See Comments)    Has patient had a PCN reaction causing immediate rash, facial/tongue/throat swelling, SOB or lightheadedness with hypotension: No Has patient had a PCN reaction causing severe rash involving mucus membranes or skin necrosis: No Has patient had a PCN reaction that required hospitalization: Yes Has patient had a PCN reaction occurring within the last 10 years: No If all of the above answers are "NO", then may proceed with Cephalosporin use.    Ultram [Tramadol Hcl] Nausea And Vomiting     PHYSICAL EXAM:  ECOG Performance status: 1  Vitals:   01/21/22 1534  BP: (!) 146/45  Pulse: 85  Resp: 17  Temp: 98.1 F (36.7 C)  SpO2: 100%   Filed Weights   01/21/22 1534  Weight: 128 lb (58.1 kg)   Physical Exam Vitals reviewed.  Constitutional:      Appearance: Normal appearance.  Cardiovascular:     Rate and Rhythm: Normal rate and regular rhythm.     Heart sounds: Normal heart sounds.  Pulmonary:     Effort: Pulmonary effort is normal.     Breath sounds: Normal breath sounds.  Abdominal:     Palpations: Abdomen is soft.  Neurological:     Mental Status: She is alert.  Psychiatric:        Mood and Affect: Mood normal.      LABORATORY DATA:  I have reviewed the labs as listed.  CBC    Component Value Date/Time   WBC 1.9 (L) 01/15/2022 1126   RBC 3.44 (L) 01/15/2022 1127   RBC 3.46 (L) 01/15/2022 1126   HGB 10.7 (L) 01/15/2022 1126   HCT 33.8 (L) 01/15/2022 1126   PLT 61 (L) 01/15/2022 1126   MCV 97.7 01/15/2022 1126   MCH 30.9 01/15/2022 1126   MCHC 31.7 01/15/2022 1126   RDW 18.0 (H) 01/15/2022 1126   LYMPHSABS 0.6 (L) 01/15/2022 1126   MONOABS 0.1 01/15/2022 1126   EOSABS 0.0 01/15/2022 1126   BASOSABS 0.0 01/15/2022 1126      Latest Ref Rng & Units 04/24/2021    3:44 PM 10/10/2020  9:29 AM 06/16/2020    8:32 AM  CMP  Glucose 65 - 99 mg/dL  75  110   BUN 7 - 25 mg/dL  32  25   Creatinine 0.44 - 1.00 mg/dL 1.00  1.02  1.14   Sodium 135 -  146 mmol/L  144  141   Potassium 3.5 - 5.3 mmol/L  4.2  4.1   Chloride 98 - 110 mmol/L  106  107   CO2 20 - 32 mmol/L  27  25   Calcium 8.6 - 10.4 mg/dL  9.2  9.4   Total Protein 6.1 - 8.1 g/dL  6.8    Total Bilirubin 0.2 - 1.2 mg/dL  0.4    AST 10 - 35 U/L  24    ALT 6 - 29 U/L  18      DIAGNOSTIC IMAGING:  I have independently reviewed the scans and discussed with the patient.  ASSESSMENT:  1.  Pancytopenia and normocytic anemia: - Patient seen at the request of Scherrie Gerlach, NP - CBC (01/01/2022): Hemoglobin 11.4, PLT 51. 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  Normal duodenum. - Colonoscopy (06/29/2019): Perianal skin tags found on perianal exam.  2 small polyps at the splenic flexure.  External hemorrhoids. - BMBX (09/09/2020): Normocellular marrow (20%) with TLH, including minimal dyserythropoiesis and 1% blasts by manual aspirate differential.  Mild dyserythropoiesis which does not fulfill criteria for MDS.  Karyotype 45, X, -X[4]/46, XX[16], loss of 1 X chromosome is rare finding in the bone marrow is of older woman and when present as a minor clone (less than 50% of 20 metaphases) is thought to be most likely aging effect. - CT [facility] (04/24/2021): Spleen enlarged measuring 14.1 cm with volume 632 mL.  Liver has nodular contour consistent with cirrhosis. - CBC (11/27/2021): Hemoglobin 9.4. - She reportedly received 1 unit PRBC on 12/02/2021 and 12/08/2021. - She was also receiving intermittent IV iron. - She denies any bleeding per rectum or melena.   2.  Social/family history: - Her daughter lives with her at home.  She is independent of ADLs and IADLs.  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  Quit smoking in November 1992. - Maternal grandmother had low platelets.  Sister had low platelet. - Mother had colon cancer.  Sister had stomach cancer.  Paternal uncle had  lung cancer.   PLAN:  1.  Normocytic anemia: - Multifactorial anemia from blood loss, iron deficiency and CKD. - Labs on 01/15/2022 were reviewed.  Hemoglobin has come down to 10.7 from 11.4 previously.  T01, MMA, folic acid were normal.  Ferritin is 104 and percent saturation 22.  LDH is 164.  Reticulocyte count 2.2%. - SPEP is negative.  Free light chain ratio is 1.7 with kappa light chains 59.8 and lambda light chains 35. - As her hemoglobin and iron levels are downtrending, I have recommended parenteral iron therapy with Feraheme x 2. - RTC 10 weeks with repeat CBC, ferritin and iron panel.  2.  Leukopenia and thrombocytopenia: - Previous bone marrow biopsy showed normocellular marrow without significant dysplasia. - Leukopenia and thrombocytopenia likely from splenomegaly measuring 14.1 cm. - Latest CBC showed white count 1.9 with ANC of 1.1 and PLT of 61.  Will continue to monitor closely.      Orders placed this encounter:  Orders Placed This Encounter  Procedures   CBC with Differential/Platelet  Reticulocytes   Ferritin   Iron and TIBC      Derek Jack, MD [place] [phone]

## 2022-01-21 NOTE — Patient Instructions (Signed)
[facility]  Discharge Instructions  You were seen and examined today by Dr. Delton Coombes.  Dr. Delton Coombes discussed your most recent lab work which revealed that your iron is low.  Dr. Delton Coombes is going to get you scheduled for iron transfusions.  Follow-up as scheduled in 10 weeks with labs.    Thank you for choosing [facility] to provide your oncology and hematology care.   To afford each patient quality time with our provider, please arrive at least 15 minutes before your scheduled appointment time. You may need to reschedule your appointment if you arrive late (10 or more minutes). Arriving late affects you and other patients whose appointments are after yours.  Also, if you miss three or more appointments without notifying the office, you may be dismissed from the clinic at the provider's discretion.    Again, thank you for choosing [facility].  Our hope is that these requests will decrease the amount of time that you wait before being seen by our physicians.   If you have a lab appointment with the [facility] please come in thru the Main Entrance and check in at the main information desk.           _____________________________________________________________  Should you have questions after your visit to [facility], please contact our office at [phone] and follow the prompts.  Our office hours are 8:00 a.m. to 4:30 p.m. Monday - Thursday and 8:00 a.m. to 2:30 p.m. Friday.  Please note that voicemails left after 4:00 p.m. may not be returned until the following business day.  We are closed weekends and all major holidays.  You do have access to a nurse 24-7, just call the main number to the clinic [phone] and do not press any options, hold on the line and a nurse will answer the phone.    For prescription refill requests, have your pharmacy contact our office and allow 72 hours.     Masks are optional in the cancer centers. If you would like for your care team to wear a mask while they are taking care of you, please let them know. You may have one support person who is at least 83 years old accompany you for your appointments.

## 2022-01-24 LAB — COPPER, SERUM: Copper: 84 ug/dL (ref 80–158)

## 2022-01-25 ENCOUNTER — Inpatient Hospital Stay: Payer: Medicare Other

## 2022-01-25 VITALS — BP 137/69 | HR 77 | Temp 97.0°F | Resp 17

## 2022-01-25 DIAGNOSIS — D509 Iron deficiency anemia, unspecified: Secondary | ICD-10-CM | POA: Diagnosis not present

## 2022-01-25 DIAGNOSIS — N189 Chronic kidney disease, unspecified: Secondary | ICD-10-CM | POA: Diagnosis not present

## 2022-01-25 DIAGNOSIS — D649 Anemia, unspecified: Secondary | ICD-10-CM | POA: Diagnosis not present

## 2022-01-25 DIAGNOSIS — Z79899 Other long term (current) drug therapy: Secondary | ICD-10-CM | POA: Diagnosis not present

## 2022-01-25 DIAGNOSIS — D61818 Other pancytopenia: Secondary | ICD-10-CM | POA: Diagnosis not present

## 2022-01-25 DIAGNOSIS — D631 Anemia in chronic kidney disease: Secondary | ICD-10-CM | POA: Diagnosis not present

## 2022-01-25 MED ORDER — SODIUM CHLORIDE 0.9 % IV SOLN: Freq: Once | INTRAVENOUS | Status: AC

## 2022-01-25 MED ORDER — SODIUM CHLORIDE 0.9 % IV SOLN
510.0000 mg | Freq: Once | INTRAVENOUS | Status: AC
  Administered 2022-01-25: 510 mg via INTRAVENOUS
  Filled 2022-01-25: qty 510

## 2022-01-25 NOTE — Patient Instructions (Signed)
[facility]  Discharge Instructions: Thank you for choosing [facility] to provide your oncology and hematology care.  If you have a lab appointment with the [facility], please come in thru the Main Entrance and check in at the main information desk.  Wear comfortable clothing and clothing appropriate for easy access to any Portacath or PICC line.   We strive to give you quality time with your provider. You may need to reschedule your appointment if you arrive late (15 or more minutes).  Arriving late affects you and other patients whose appointments are after yours.  Also, if you miss three or more appointments without notifying the office, you may be dismissed from the clinic at the provider's discretion.      For prescription refill requests, have your pharmacy contact our office and allow 72 hours for refills to be completed.    Today you received the following chemotherapy and/or immunotherapy agents feraheme      To help prevent nausea and vomiting after your treatment, we encourage you to take your nausea medication as directed.  BELOW ARE SYMPTOMS THAT SHOULD BE REPORTED IMMEDIATELY: *FEVER GREATER THAN 100.4 F (38 C) OR HIGHER *CHILLS OR SWEATING *NAUSEA AND VOMITING THAT IS NOT CONTROLLED WITH YOUR NAUSEA MEDICATION *UNUSUAL SHORTNESS OF BREATH *UNUSUAL BRUISING OR BLEEDING *URINARY PROBLEMS (pain or burning when urinating, or frequent urination) *BOWEL PROBLEMS (unusual diarrhea, constipation, pain near the anus) TENDERNESS IN MOUTH AND THROAT WITH OR WITHOUT PRESENCE OF ULCERS (sore throat, sores in mouth, or a toothache) UNUSUAL RASH, SWELLING OR PAIN  UNUSUAL VAGINAL DISCHARGE OR ITCHING   Items with * indicate a potential emergency and should be followed up as soon as possible or go to the Emergency Department if any problems should occur.  Please show the CHEMOTHERAPY ALERT CARD or IMMUNOTHERAPY ALERT CARD at check-in to the  Emergency Department and triage nurse.  Should you have questions after your visit or need to cancel or reschedule your appointment, please contact [facility] [phone]  and follow the prompts.  Office hours are 8:00 a.m. to 4:30 p.m. Monday - Friday. Please note that voicemails left after 4:00 p.m. may not be returned until the following business day.  We are closed weekends and major holidays. You have access to a nurse at all times for urgent questions. Please call the main number to the clinic [phone] and follow the prompts.  For any non-urgent questions, you may also contact your provider using MyChart. We now offer e-Visits for anyone 77 and older to request care online for non-urgent symptoms. For details visit [email or web address].   Also download the MyChart app! Go to the app store, search "MyChart", open the app, select [facility], and log in with your MyChart username and password.  Masks are optional in the cancer centers. If you would like for your care team to wear a mask while they are taking care of you, please let them know. You may have one support person who is at least 83 years old accompany you for your appointments.

## 2022-01-25 NOTE — Progress Notes (Signed)
Patient presents today for Feraheme infusion per providers order.  Vital signs WNL.  Patient has no new complaints at this time.  Peripheral IV started and blood return noted pre and post infusion.    Stable during infusion without adverse affects.  Vital signs stable.  No complaints at this time.  Discharge from clinic ambulatory in stable condition.  Alert and oriented X 3.  Follow up with [facility] as scheduled.

## 2022-02-01 ENCOUNTER — Inpatient Hospital Stay: Payer: Medicare Other

## 2022-02-01 VITALS — BP 144/61 | HR 72 | Temp 96.9°F | Resp 16

## 2022-02-01 DIAGNOSIS — D61818 Other pancytopenia: Secondary | ICD-10-CM | POA: Diagnosis not present

## 2022-02-01 DIAGNOSIS — N189 Chronic kidney disease, unspecified: Secondary | ICD-10-CM | POA: Diagnosis not present

## 2022-02-01 DIAGNOSIS — D649 Anemia, unspecified: Secondary | ICD-10-CM | POA: Diagnosis not present

## 2022-02-01 DIAGNOSIS — D631 Anemia in chronic kidney disease: Secondary | ICD-10-CM | POA: Diagnosis not present

## 2022-02-01 DIAGNOSIS — D509 Iron deficiency anemia, unspecified: Secondary | ICD-10-CM | POA: Diagnosis not present

## 2022-02-01 DIAGNOSIS — Z79899 Other long term (current) drug therapy: Secondary | ICD-10-CM | POA: Diagnosis not present

## 2022-02-01 MED ORDER — SODIUM CHLORIDE 0.9 % IV SOLN
510.0000 mg | Freq: Once | INTRAVENOUS | Status: AC
  Administered 2022-02-01: 510 mg via INTRAVENOUS
  Filled 2022-02-01: qty 17

## 2022-02-01 MED ORDER — SODIUM CHLORIDE 0.9 % IV SOLN: Freq: Once | INTRAVENOUS | Status: AC

## 2022-02-01 NOTE — Progress Notes (Signed)
Patient presents today for Feraheme mg infusion. Vital signs stable. Patient has no complaints or side effects related to her last iron infusion. Patient states she does not take pre-medications prior to her iron.   Feraheme given today per MD orders. Tolerated infusion without adverse affects. Vital signs stable. No complaints at this time. Discharged from clinic ambulatory in stable condition. Alert and oriented x 3. F/U with [facility] as scheduled.

## 2022-02-01 NOTE — Patient Instructions (Signed)
[facility]  Discharge Instructions: Thank you for choosing [facility] to provide your oncology and hematology care.  If you have a lab appointment with the [facility], please come in thru the Main Entrance and check in at the main information desk.  Wear comfortable clothing and clothing appropriate for easy access to any Portacath or PICC line.   We strive to give you quality time with your provider. You may need to reschedule your appointment if you arrive late (15 or more minutes).  Arriving late affects you and other patients whose appointments are after yours.  Also, if you miss three or more appointments without notifying the office, you may be dismissed from the clinic at the provider's discretion.      For prescription refill requests, have your pharmacy contact our office and allow 72 hours for refills to be completed.    Today you received the following chemotherapy and/or immunotherapy agents Feraheme. Ferumoxytol Injection What is this medication? FERUMOXYTOL (FER ue MOX i tol) treats low levels of iron in your body (iron deficiency anemia). Iron is a mineral that plays an important role in making red blood cells, which carry oxygen from your lungs to the rest of your body. This medicine may be used for other purposes; ask your health care provider or pharmacist if you have questions. COMMON BRAND NAME(S): Feraheme What should I tell my care team before I take this medication? They need to know if you have any of these conditions: Anemia not caused by low iron levels High levels of iron in the blood Magnetic resonance imaging (MRI) test scheduled An unusual or allergic reaction to iron, other medications, foods, dyes, or preservatives Pregnant or trying to get pregnant Breastfeeding How should I use this medication? This medication is injected into a vein. It is given by your care team in a hospital or clinic setting. Talk to your care  team the use of this medication in children. Special care may be needed. Overdosage: If you think you have taken too much of this medicine contact a poison control center or emergency room at once. NOTE: This medicine is only for you. Do not share this medicine with others. What if I miss a dose? It is important not to miss your dose. Call your care team if you are unable to keep an appointment. What may interact with this medication? Other iron products This list may not describe all possible interactions. Give your health care provider a list of all the medicines, herbs, non-prescription drugs, or dietary supplements you use. Also tell them if you smoke, drink alcohol, or use illegal drugs. Some items may interact with your medicine. What should I watch for while using this medication? Visit your care team regularly. Tell your care team if your symptoms do not start to get better or if they get worse. You may need blood work done while you are taking this medication. You may need to follow a special diet. Talk to your care team. Foods that contain iron include: whole grains/cereals, dried fruits, beans, or peas, leafy green vegetables, and organ meats (liver, kidney). What side effects may I notice from receiving this medication? Side effects that you should report to your care team as soon as possible: Allergic reactions--skin rash, itching, hives, swelling of the face, lips, tongue, or throat Low blood pressure--dizziness, feeling faint or lightheaded, blurry vision Shortness of breath Side effects that usually do not require medical attention (report to your  care team if they continue or are bothersome): Flushing Headache Joint pain Muscle pain Nausea Pain, redness, or irritation at injection site This list may not describe all possible side effects. Call your doctor for medical advice about side effects. You may report side effects to FDA at 1-800-FDA-1088. Where should I keep my  medication? This medication is given in a hospital or clinic and will not be stored at home. NOTE: This sheet is a summary. It may not cover all possible information. If you have questions about this medicine, talk to your doctor, pharmacist, or health care provider.  2023 Elsevier/Gold Standard (2020-06-25 00:00:00)       To help prevent nausea and vomiting after your treatment, we encourage you to take your nausea medication as directed.  BELOW ARE SYMPTOMS THAT SHOULD BE REPORTED IMMEDIATELY: *FEVER GREATER THAN 100.4 F (38 C) OR HIGHER *CHILLS OR SWEATING *NAUSEA AND VOMITING THAT IS NOT CONTROLLED WITH YOUR NAUSEA MEDICATION *UNUSUAL SHORTNESS OF BREATH *UNUSUAL BRUISING OR BLEEDING *URINARY PROBLEMS (pain or burning when urinating, or frequent urination) *BOWEL PROBLEMS (unusual diarrhea, constipation, pain near the anus) TENDERNESS IN MOUTH AND THROAT WITH OR WITHOUT PRESENCE OF ULCERS (sore throat, sores in mouth, or a toothache) UNUSUAL RASH, SWELLING OR PAIN  UNUSUAL VAGINAL DISCHARGE OR ITCHING   Items with * indicate a potential emergency and should be followed up as soon as possible or go to the Emergency Department if any problems should occur.  Please show the CHEMOTHERAPY ALERT CARD or IMMUNOTHERAPY ALERT CARD at check-in to the Emergency Department and triage nurse.  Should you have questions after your visit or need to cancel or reschedule your appointment, please contact [facility] [phone]  and follow the prompts.  Office hours are 8:00 a.m. to 4:30 p.m. Monday - Friday. Please note that voicemails left after 4:00 p.m. may not be returned until the following business day.  We are closed weekends and major holidays. You have access to a nurse at all times for urgent questions. Please call the main number to the clinic [phone] and follow the prompts.  For any non-urgent questions, you may also contact your provider using MyChart. We now  offer e-Visits for anyone 48 and older to request care online for non-urgent symptoms. For details visit [email or web address].   Also download the MyChart app! Go to the app store, search "MyChart", open the app, select [facility], and log in with your MyChart username and password.  Masks are optional in the cancer centers. If you would like for your care team to wear a mask while they are taking care of you, please let them know. You may have one support person who is at least 83 years old accompany you for your appointments.

## 2022-02-11 ENCOUNTER — Ambulatory Visit: Payer: Medicare Other | Admitting: Gastroenterology

## 2022-02-26 DIAGNOSIS — R509 Fever, unspecified: Secondary | ICD-10-CM | POA: Diagnosis not present

## 2022-02-26 DIAGNOSIS — J019 Acute sinusitis, unspecified: Secondary | ICD-10-CM | POA: Diagnosis not present

## 2022-02-26 DIAGNOSIS — Z299 Encounter for prophylactic measures, unspecified: Secondary | ICD-10-CM | POA: Diagnosis not present

## 2022-02-26 DIAGNOSIS — I10 Essential (primary) hypertension: Secondary | ICD-10-CM | POA: Diagnosis not present

## 2022-03-10 DIAGNOSIS — I10 Essential (primary) hypertension: Secondary | ICD-10-CM | POA: Diagnosis not present

## 2022-03-10 DIAGNOSIS — Z299 Encounter for prophylactic measures, unspecified: Secondary | ICD-10-CM | POA: Diagnosis not present

## 2022-03-10 DIAGNOSIS — R040 Epistaxis: Secondary | ICD-10-CM | POA: Diagnosis not present

## 2022-03-10 DIAGNOSIS — E1142 Type 2 diabetes mellitus with diabetic polyneuropathy: Secondary | ICD-10-CM | POA: Diagnosis not present

## 2022-03-10 DIAGNOSIS — I700 Atherosclerosis of aorta: Secondary | ICD-10-CM | POA: Diagnosis not present

## 2022-03-10 DIAGNOSIS — E1165 Type 2 diabetes mellitus with hyperglycemia: Secondary | ICD-10-CM | POA: Diagnosis not present

## 2022-03-20 ENCOUNTER — Other Ambulatory Visit (INDEPENDENT_AMBULATORY_CARE_PROVIDER_SITE_OTHER): Payer: Self-pay | Admitting: Gastroenterology

## 2022-03-20 DIAGNOSIS — R1030 Lower abdominal pain, unspecified: Secondary | ICD-10-CM

## 2022-03-22 DIAGNOSIS — H43811 Vitreous degeneration, right eye: Secondary | ICD-10-CM | POA: Diagnosis not present

## 2022-03-22 DIAGNOSIS — H353132 Nonexudative age-related macular degeneration, bilateral, intermediate dry stage: Secondary | ICD-10-CM | POA: Diagnosis not present

## 2022-03-22 DIAGNOSIS — E109 Type 1 diabetes mellitus without complications: Secondary | ICD-10-CM | POA: Diagnosis not present

## 2022-03-25 ENCOUNTER — Inpatient Hospital Stay: Payer: Medicare Other | Attending: Hematology

## 2022-03-25 DIAGNOSIS — D72819 Decreased white blood cell count, unspecified: Secondary | ICD-10-CM | POA: Insufficient documentation

## 2022-03-25 DIAGNOSIS — R161 Splenomegaly, not elsewhere classified: Secondary | ICD-10-CM | POA: Diagnosis not present

## 2022-03-25 DIAGNOSIS — D696 Thrombocytopenia, unspecified: Secondary | ICD-10-CM | POA: Diagnosis not present

## 2022-03-25 DIAGNOSIS — K7581 Nonalcoholic steatohepatitis (NASH): Secondary | ICD-10-CM | POA: Diagnosis not present

## 2022-03-25 DIAGNOSIS — D649 Anemia, unspecified: Secondary | ICD-10-CM | POA: Diagnosis not present

## 2022-03-25 DIAGNOSIS — Z87891 Personal history of nicotine dependence: Secondary | ICD-10-CM | POA: Diagnosis not present

## 2022-03-25 DIAGNOSIS — D61818 Other pancytopenia: Secondary | ICD-10-CM | POA: Insufficient documentation

## 2022-03-25 LAB — CBC WITH DIFFERENTIAL/PLATELET
Abs Immature Granulocytes: 0.01 10*3/uL (ref 0.00–0.07)
Basophils Absolute: 0 10*3/uL (ref 0.0–0.1)
Basophils Relative: 0 %
Eosinophils Absolute: 0 10*3/uL (ref 0.0–0.5)
Eosinophils Relative: 2 %
HCT: 33.5 % — ABNORMAL LOW (ref 36.0–46.0)
Hemoglobin: 10.5 g/dL — ABNORMAL LOW (ref 12.0–15.0)
Immature Granulocytes: 0 %
Lymphocytes Relative: 41 %
Lymphs Abs: 0.9 10*3/uL (ref 0.7–4.0)
MCH: 32.2 pg (ref 26.0–34.0)
MCHC: 31.3 g/dL (ref 30.0–36.0)
MCV: 102.8 fL — ABNORMAL HIGH (ref 80.0–100.0)
Monocytes Absolute: 0.1 10*3/uL (ref 0.1–1.0)
Monocytes Relative: 5 %
Neutro Abs: 1.2 10*3/uL — ABNORMAL LOW (ref 1.7–7.7)
Neutrophils Relative %: 52 %
Platelets: 37 10*3/uL — ABNORMAL LOW (ref 150–400)
RBC: 3.26 MIL/uL — ABNORMAL LOW (ref 3.87–5.11)
RDW: 15.7 % — ABNORMAL HIGH (ref 11.5–15.5)
WBC: 2.3 10*3/uL — ABNORMAL LOW (ref 4.0–10.5)
nRBC: 0 % (ref 0.0–0.2)

## 2022-03-25 LAB — RETICULOCYTES
Immature Retic Fract: 15.2 % (ref 2.3–15.9)
RBC.: 3.26 MIL/uL — ABNORMAL LOW (ref 3.87–5.11)
Retic Count, Absolute: 64.2 10*3/uL (ref 19.0–186.0)
Retic Ct Pct: 2 % (ref 0.4–3.1)

## 2022-03-25 LAB — IRON AND TIBC
Iron: 70 ug/dL (ref 28–170)
Saturation Ratios: 21 % (ref 10.4–31.8)
TIBC: 338 ug/dL (ref 250–450)
UIBC: 268 ug/dL

## 2022-03-25 LAB — FERRITIN: Ferritin: 321 ng/mL — ABNORMAL HIGH (ref 11–307)

## 2022-04-01 ENCOUNTER — Inpatient Hospital Stay (HOSPITAL_BASED_OUTPATIENT_CLINIC_OR_DEPARTMENT_OTHER): Payer: Medicare Other | Admitting: Hematology

## 2022-04-01 VITALS — BP 133/69 | HR 87 | Temp 98.0°F | Resp 18 | Ht 63.0 in | Wt 125.1 lb

## 2022-04-01 DIAGNOSIS — R161 Splenomegaly, not elsewhere classified: Secondary | ICD-10-CM | POA: Diagnosis not present

## 2022-04-01 DIAGNOSIS — D509 Iron deficiency anemia, unspecified: Secondary | ICD-10-CM

## 2022-04-01 DIAGNOSIS — D696 Thrombocytopenia, unspecified: Secondary | ICD-10-CM | POA: Diagnosis not present

## 2022-04-01 DIAGNOSIS — D61818 Other pancytopenia: Secondary | ICD-10-CM | POA: Diagnosis not present

## 2022-04-01 DIAGNOSIS — D649 Anemia, unspecified: Secondary | ICD-10-CM | POA: Diagnosis not present

## 2022-04-01 DIAGNOSIS — K7581 Nonalcoholic steatohepatitis (NASH): Secondary | ICD-10-CM | POA: Diagnosis not present

## 2022-04-01 DIAGNOSIS — D72819 Decreased white blood cell count, unspecified: Secondary | ICD-10-CM | POA: Diagnosis not present

## 2022-04-01 NOTE — Patient Instructions (Signed)
[facility]  Discharge Instructions  You were seen and examined today by Dr. Delton Coombes.  Dr. Delton Coombes discussed your most recent lab work which revealed that everything looks good and stable.   Follow-up as scheduled in 2 months in labs.    Thank you for choosing [facility] to provide your oncology and hematology care.   To afford each patient quality time with our provider, please arrive at least 15 minutes before your scheduled appointment time. You may need to reschedule your appointment if you arrive late (10 or more minutes). Arriving late affects you and other patients whose appointments are after yours.  Also, if you miss three or more appointments without notifying the office, you may be dismissed from the clinic at the provider's discretion.    Again, thank you for choosing [facility].  Our hope is that these requests will decrease the amount of time that you wait before being seen by our physicians.   If you have a lab appointment with the [facility] please come in thru the Main Entrance and check in at the main information desk.           _____________________________________________________________  Should you have questions after your visit to [facility], please contact our office at [phone] and follow the prompts.  Our office hours are 8:00 a.m. to 4:30 p.m. Monday - Thursday and 8:00 a.m. to 2:30 p.m. Friday.  Please note that voicemails left after 4:00 p.m. may not be returned until the following business day.  We are closed weekends and all major holidays.  You do have access to a nurse 24-7, just call the main number to the clinic [phone] and do not press any options, hold on the line and a nurse will answer the phone.    For prescription refill requests, have your pharmacy contact our office and allow 72 hours.    Masks are optional in the cancer centers. If you would  like for your care team to wear a mask while they are taking care of you, please let them know. You may have one support person who is at least 84 years old accompany you for your appointments.

## 2022-04-01 NOTE — Progress Notes (Signed)
Jeanette Chang [address], [place] [address]    Clinic Day:  04/01/2022  Referring physician: Monico Blitz, MD  Patient Care Team: Jeanette Blitz, MD as PCP - General (Internal Medicine) Jeanette Chang, Jeanette Stai, MD (Hematology and Oncology) Jeanette Chang., MD as Referring Physician (Otolaryngology)   ASSESSMENT & PLAN:   Assessment: .  Pancytopenia and normocytic anemia: - Patient seen at the request of Jeanette Gerlach, NP - CBC (01/01/2022): Hemoglobin 11.4, PLT 51. 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  Normal duodenum. - Colonoscopy (06/29/2019): Perianal skin tags found on perianal exam.  2 small polyps at the splenic flexure.  External hemorrhoids. - BMBX (09/09/2020): Normocellular marrow (20%) with TLH, including minimal dyserythropoiesis and 1% blasts by manual aspirate differential.  Mild dyserythropoiesis which does not fulfill criteria for MDS.  Karyotype 45, X, -X[4]/46, XX[16], loss of 1 X chromosome is rare finding in the bone marrow is of older woman and when present as a minor clone (less than 50% of 20 metaphases) is thought to be most likely aging effect. - CT AP (04/24/2021): Spleen enlarged measuring 14.1 cm with volume 632 mL.  Liver has nodular contour consistent with cirrhosis. - CBC (11/27/2021): Hemoglobin 9.4. - She reportedly received 1 unit PRBC on 12/02/2021 and 12/08/2021. - She was also receiving intermittent IV iron. - She denies any bleeding per rectum or melena.   2.  Social/family history: - Her daughter lives with her at home.  She is independent of ADLs and IADLs.  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  Quit smoking in November 1992. - Maternal grandmother had low platelets.  Sister had low platelet. - Mother had colon cancer.  Sister had stomach cancer.  Paternal uncle had lung cancer.  Plan:  1.  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 - She received Feraheme in December 2023. - She has improved energy levels.  Denies any bleeding per rectum or melena.  No ice pica or restless leg syndrome symptoms which she usually develops when her iron is low. - We reviewed labs from 03/25/2022.  Ferritin is 321 and percent saturation 21.  Hemoglobin stable at 10.5. - No indication for parenteral iron.  RTC 2 months with repeat CBC, ferritin and iron panel.   2.  Leukopenia and thrombocytopenia: - Previous bone marrow biopsy showed normocellular marrow without significant dysplasia. 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 - Latest CBC shows white count 2.3 with ANC of 1.2.  Platelet count is 37,000.  No active bleeding.  Will closely monitor.  Orders Placed This Encounter  Procedures   CBC    Standing Status:   Future    Standing Expiration Date:   04/01/2023   Ferritin    Standing Status:   Future    Standing Expiration Date:   04/02/2023    Order Specific Question:   Release to patient    Answer:   Immediate   Iron and TIBC    Standing Status:   Future    Standing Expiration Date:   04/02/2023    Order Specific Question:   Release to patient    Answer:   Immediate      Jeanette Chang as a scribe for Jeanette Jack, MD.,have documented all relevant documentation on the behalf of Jeanette Jack, MD,as directed by  Jeanette Chang  Delton Coombes, MD while in the presence of Jeanette Jack, MD.   I, Jeanette Jack MD, have reviewed the above documentation for accuracy and completeness, and I agree with the above.   Jeanette Chang   2/15/20243:32 PM  CHIEF COMPLAINT:   Diagnosis: pancytopenia normocytic anemia    Cancer Staging  No matching staging information was found for the patient.   Prior Therapy: intermittent parenteral iron   Current Therapy:  Feraheme    HISTORY OF PRESENT ILLNESS:    Oncology History   No history exists.     INTERVAL HISTORY:   Jeanette Chang is a 82 y.o. female presenting to clinic today for follow up of pancytopenia normocytic anemia .   Today, she states that she is doing well overall. Her appetite level is at 70%. Her energy level is at 50%.She has 6/10 hip pain. She continued to feel tired after the iron infusion.  She denies blood in stool and black stool. She has had one nose bleed since an humidifier was added to her room.     PAST MEDICAL HISTORY:   Past Medical History: Past Medical History:  Diagnosis Date   Abnormal LFTs    Achilles bursitis or tendinitis    Acute maxillary sinusitis    Acute pharyngitis    Anemia, unspecified    Candidiasis of skin and nails    Dermatophytosis of foot    Dermatophytosis of the body    Diverticulitis of colon (without mention of hemorrhage)(562.11)    Edema    Headache(784.0)    HOH (hard of hearing)    Malaise and fatigue    Occlusion and stenosis of carotid artery without mention of cerebral infarction    Osteoarthrosis, unspecified whether generalized or localized, unspecified site    Other dyspnea and respiratory abnormality    Other psoriasis    Other specified disorders of rotator cuff syndrome of shoulder and allied disorders    Pain in joint    Pure hypercholesterolemia    RUQ pain    Spasm of back muscles    Thrombocytopenia, unspecified ([place])    Type II or unspecified type diabetes mellitus without mention of complication, not stated as uncontrolled    Unspecified essential hypertension    Unspecified sinusitis (chronic)    Urinary incontinence     Surgical History: Past Surgical History:  Procedure Laterality Date   APPENDECTOMY  1964   BIOPSY  03/02/2017   Procedure: BIOPSY;  Surgeon: Rogene Houston, MD;  [facility];  Service: Endoscopy;;  antral   BIOPSY  06/17/2020   Procedure: BIOPSY;  Surgeon: Harvel Quale, MD;  [facility];   Service: Gastroenterology;;   CAROTID ENDARTERECTOMY  2005   Left CEA   CAROTID ENDARTERECTOMY  2009   Right CEA   CATARACT EXTRACTION, BILATERAL Bilateral    CHOLECYSTECTOMY  1982   Gall Bladder   COLONOSCOPY  09   COLONOSCOPY N/A 06/27/2019   Procedure: COLONOSCOPY;  Surgeon: Rogene Houston, MD;  [facility];  Service: Endoscopy;  Laterality: N/A;   ESOPHAGOGASTRODUODENOSCOPY N/A 03/02/2017   Procedure: ESOPHAGOGASTRODUODENOSCOPY (EGD);  Surgeon: Rogene Houston, MD;  [facility];  Service: Endoscopy;  Laterality: N/A;  12:55   ESOPHAGOGASTRODUODENOSCOPY N/A 06/27/2019   Procedure: ESOPHAGOGASTRODUODENOSCOPY (EGD);  Surgeon: Rogene Houston, MD;  [facility];  Service: Endoscopy;  Laterality: N/A;  730   ESOPHAGOGASTRODUODENOSCOPY (EGD) WITH PROPOFOL N/A 06/17/2020   Procedure: ESOPHAGOGASTRODUODENOSCOPY (EGD) WITH PROPOFOL;  Surgeon: Montez Morita, Quillian Quince, MD;  [facility];  Service: Gastroenterology;  Laterality: N/A;  AM   GIVENS CAPSULE STUDY N/A 10/24/2019   Procedure: GIVENS CAPSULE STUDY;  Surgeon: Rogene Houston, MD;  [facility];  Service: Endoscopy;  Laterality: N/A;  730   KNEE ARTHROSCOPY WITH MEDIAL MENISECTOMY Left 07/10/2019   Procedure: KNEE ARTHROSCOPY WITH MEDIAL MENISCECTOMY;  Surgeon: Carole Civil, MD;  [facility];  Service: Orthopedics;  Laterality: Left;   KNEE ARTHROSCOPY WITH MEDIAL MENISECTOMY Left 03/14/2020   Procedure: KNEE ARTHROSCOPY WITH MEDIAL MENISCECTOMY;  Surgeon: Carole Civil, MD;  [facility];  Service: Orthopedics;  Laterality: Left;   NOSE SURGERY  1978   PARATHYROIDECTOMY  1992   POLYPECTOMY  03/02/2017   Procedure: POLYPECTOMY;  Surgeon: Rogene Houston, MD;  [facility];  Service: Endoscopy;;  gastric    POLYPECTOMY  06/27/2019   Procedure: POLYPECTOMY;  Surgeon: Rogene Houston, MD;  [facility];  Service: Endoscopy;;  colon   Power  port  03/06/2009   Right upper chest    TONSILLECTOMY     TUMOR EXCISION Right August 24, 2013   [facility] Dr. Ellen Henri, ENT   VAGINAL HYSTERECTOMY  1972    Social History: Social History   Socioeconomic History   Marital status: Widowed    Spouse name: Not on file   Number of children: Not on file   Years of education: Not on file   Highest education level: Not on file  Occupational History   Not on file  Tobacco Use   Smoking status: Former    Packs/day: 1.00    Years: 42.00    Total pack years: 42.00    Types: Cigarettes    Quit date: 03/12/1992    Years since quitting: 30.0    Passive exposure: Past   Smokeless tobacco: Former    Quit date: 12/16/1992  Vaping Use   Vaping Use: Never used  Substance and Sexual Activity   Alcohol use: Yes    Alcohol/week: 1.0 standard drink of alcohol    Types: 1 Glasses of wine per week    Comment: very seldom   Drug use: No   Sexual activity: Not Currently  Other Topics Concern   Not on file  Social History Narrative   Not on file   Social Determinants of Health   Financial Resource Strain: Not on file  Food Insecurity: Not on file  Transportation Needs: Not on file  Physical Activity: Not on file  Stress: Not on file  Social Connections: Not on file  Intimate Partner Violence: Not on file    Family History: Family History  Problem Relation Age of Onset   Heart attack Mother    Cancer Mother    Heart attack Father    Cancer Sister     Current Medications:  Current Outpatient Medications:    acetaminophen (TYLENOL) 500 MG tablet, Take 1 tablet (500 mg total) by mouth every 6 (six) hours as needed for moderate pain or headache., Disp: 30 tablet, Rfl: 0   aspirin EC 81 MG tablet, Take 81 mg by mouth every other day. AT NIGHT., Disp:  , Rfl:    Biotin 10 MG CAPS, Take by mouth daily at 6 (six) AM., Disp: , Rfl:    cholecalciferol (VITAMIN D) 25 MCG (1000 UNIT) tablet, Take 1,000 Units by mouth  daily., Disp: , Rfl:    dapagliflozin propanediol (  FARXIGA) 5 MG TABS tablet, Take 5 mg by mouth daily., Disp: , Rfl:    dicyclomine (BENTYL) 10 MG capsule, TAKE 1 CAPSULE (10 MG TOTAL) BY MOUTH 3 (THREE) TIMES DAILY BEFORE MEALS., Disp: 270 capsule, Rfl: 0   furosemide (LASIX) 20 MG tablet, Take 20 mg by mouth as needed., Disp: , Rfl:    gabapentin (NEURONTIN) 300 MG capsule, Take 300 mg by mouth at bedtime., Disp: , Rfl:    lisinopril (ZESTRIL) 40 MG tablet, Take 40 mg by mouth daily., Disp: , Rfl:    metFORMIN (GLUCOPHAGE) 500 MG tablet, Take 1,000 mg by mouth 2 (two) times daily with a meal., Disp: , Rfl:    OVER THE COUNTER MEDICATION, Iron as needed. Not daily, Disp: , Rfl:    OVER THE COUNTER MEDICATION, Compounded Hemorrhoid cream [facility]) Apply rectally up to four times per day., Disp: , Rfl:  No current facility-administered medications for this visit.  Facility-Administered Medications Ordered in Other Visits:    sodium chloride irrigation 0.9 %, , , PRN, Carole Civil, MD, 1,000 mL at 03/14/20 0945   Allergies: Allergies  Allergen Reactions   Codeine Nausea And Vomiting and Rash   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   Toviaz [Fesoterodine Fumarate] Other (See Comments)    Cramps, severe headache   Norco [Hydrocodone-Acetaminophen] Nausea And Vomiting and Anxiety   Penicillins  Swelling, Rash and Other (See Comments)    Has patient had a PCN reaction causing immediate rash, facial/tongue/throat swelling, SOB or lightheadedness with hypotension: No Has patient had a PCN reaction causing severe rash involving mucus membranes or skin necrosis: No Has patient had a PCN reaction that required hospitalization: Yes Has patient had a PCN reaction occurring within the last 10 years: No If all of the above answers are "NO", then may proceed with Cephalosporin use.    Ultram [Tramadol Hcl] Nausea And Vomiting    REVIEW OF SYSTEMS:   Review of Systems  Constitutional:  Negative for chills, fatigue and fever.  HENT:   Negative for lump/mass, mouth sores, nosebleeds, sore throat and trouble swallowing.   Eyes:  Negative for eye problems.  Respiratory:  Positive for shortness of breath (At times). Negative for cough.   Cardiovascular:  Negative for chest pain, leg swelling and palpitations.  Gastrointestinal:  Positive for constipation and diarrhea. Negative for abdominal pain, nausea and vomiting.  Genitourinary:  Negative for bladder incontinence, difficulty urinating, dysuria, frequency, hematuria and nocturia.   Musculoskeletal:  Negative for arthralgias, back pain, flank pain, myalgias and neck pain.  Skin:  Negative for itching and rash.  Neurological:  Positive for headaches. Negative for dizziness and numbness.  Hematological:  Does not bruise/bleed easily.  Psychiatric/Behavioral:  Negative for depression, sleep disturbance and suicidal ideas. The patient is not nervous/anxious.   All other systems reviewed  and are negative.    VITALS:   Blood pressure 133/69, pulse 87, temperature 98 F (36.7 C), temperature source Oral, resp. rate 18, height 5' 3"$  (1.6 m), weight 125 lb 1.6 oz (56.7 kg), SpO2 100 %.  Wt Readings from Last 3 Encounters:  04/01/22 125 lb 1.6 oz (56.7 kg)  01/21/22 128 lb (58.1 kg)  01/15/22 130 lb 9.6 oz (59.2 kg)    Body mass index is 22.16  kg/m.  Performance status (ECOG): 1 - Symptomatic but completely ambulatory  PHYSICAL EXAM:   Physical Exam Vitals and nursing note reviewed. Exam conducted with a chaperone present.  Constitutional:      Appearance: Normal appearance.  Cardiovascular:     Rate and Rhythm: Normal rate and regular rhythm.     Pulses: Normal pulses.     Heart sounds: Normal heart sounds.  Pulmonary:     Effort: Pulmonary effort is normal.     Breath sounds: Normal breath sounds.  Abdominal:     Palpations: Abdomen is soft. There is no hepatomegaly, splenomegaly or mass.     Tenderness: There is no abdominal tenderness.  Musculoskeletal:     Right lower leg: No edema.     Left lower leg: No edema.  Lymphadenopathy:     Cervical: No cervical adenopathy.     Right cervical: No superficial, deep or posterior cervical adenopathy.    Left cervical: No superficial, deep or posterior cervical adenopathy.     Upper Body:     Right upper body: No supraclavicular or axillary adenopathy.     Left upper body: No supraclavicular or axillary adenopathy.  Neurological:     General: No focal deficit present.     Mental Status: She is alert and oriented to person, place, and time.  Psychiatric:        Mood and Affect: Mood normal.        Behavior: Behavior normal.     LABS:      Latest Ref Rng & Units 03/25/2022    1:17 PM 01/15/2022   11:26 AM 10/10/2020    9:29 AM  CBC  WBC 4.0 - 10.5 K/uL 2.3  1.9  2.0   Hemoglobin 12.0 - 15.0 g/dL 10.5  10.7  10.0   Hematocrit 36.0 - 46.0 % 33.5  33.8  31.3   Platelets 150 - 400 K/uL 37  61  48       Latest Ref Rng & Units 04/24/2021    3:44 PM 10/10/2020    9:29 AM 06/16/2020    8:32 AM  CMP  Glucose 65 - 99 mg/dL  75  110   BUN 7 - 25 mg/dL  32  25   Creatinine 0.44 - 1.00 mg/dL 1.00  1.02  1.14   Sodium 135 - 146 mmol/L  144  141   Potassium 3.5 - 5.3 mmol/L  4.2  4.1   Chloride 98 - 110 mmol/L  106  107   CO2 20 - 32 mmol/L  27  25   Calcium 8.6 - 10.4  mg/dL  9.2  9.4   Total Protein 6.1 - 8.1 g/dL  6.8    Total Bilirubin 0.2 - 1.2 mg/dL  0.4    AST 10 - 35 U/L  24    ALT 6 - 29 U/L  18       No results found for: "CEA1", "CEA" / No results found for: "CEA1", "CEA" No results found for: "PSA1" No results found for: "  [id number]" No results found for: "CAN125"  Lab Results  Component Value Date   TOTALPROTELP 6.4 01/15/2022   ALBUMINELP 3.5 01/15/2022   A1GS 0.3 01/15/2022   A2GS 0.6 01/15/2022   BETS 1.1 01/15/2022   GAMS 0.9 01/15/2022   MSPIKE Not Observed 01/15/2022   SPEI Comment 01/15/2022   Lab Results  Component Value Date   TIBC 338 03/25/2022   TIBC 284 01/15/2022   TIBC 374 02/10/2017   FERRITIN 321 (H) 03/25/2022   FERRITIN 104 01/15/2022   FERRITIN 148 02/10/2017   IRONPCTSAT 21 03/25/2022   IRONPCTSAT 22 01/15/2022   IRONPCTSAT 24 02/10/2017   Lab Results  Component Value Date   LDH 164 01/15/2022     STUDIES:   No results found.

## 2022-04-07 ENCOUNTER — Encounter (INDEPENDENT_AMBULATORY_CARE_PROVIDER_SITE_OTHER): Payer: Self-pay | Admitting: *Deleted

## 2022-04-15 ENCOUNTER — Telehealth (INDEPENDENT_AMBULATORY_CARE_PROVIDER_SITE_OTHER): Payer: Self-pay | Admitting: Gastroenterology

## 2022-04-15 DIAGNOSIS — K7460 Unspecified cirrhosis of liver: Secondary | ICD-10-CM

## 2022-04-15 NOTE — Telephone Encounter (Signed)
Pt left voicemail. Pt received recall letter for ultrasound.   [place] schedule for 04/27/22; arrive at [facility]. NPO after midnight. Spoke with Daughter Suanne Marker and gave time/date.

## 2022-04-26 ENCOUNTER — Ambulatory Visit (INDEPENDENT_AMBULATORY_CARE_PROVIDER_SITE_OTHER): Payer: Medicare Other | Admitting: Gastroenterology

## 2022-04-26 ENCOUNTER — Encounter (INDEPENDENT_AMBULATORY_CARE_PROVIDER_SITE_OTHER): Payer: Self-pay | Admitting: Gastroenterology

## 2022-04-26 VITALS — BP 115/52 | HR 84 | Temp 97.5°F | Ht 63.0 in | Wt 126.3 lb

## 2022-04-26 DIAGNOSIS — I10 Essential (primary) hypertension: Secondary | ICD-10-CM | POA: Diagnosis not present

## 2022-04-26 DIAGNOSIS — I8510 Secondary esophageal varices without bleeding: Secondary | ICD-10-CM

## 2022-04-26 DIAGNOSIS — E1165 Type 2 diabetes mellitus with hyperglycemia: Secondary | ICD-10-CM | POA: Diagnosis not present

## 2022-04-26 DIAGNOSIS — E1142 Type 2 diabetes mellitus with diabetic polyneuropathy: Secondary | ICD-10-CM | POA: Diagnosis not present

## 2022-04-26 DIAGNOSIS — K7460 Unspecified cirrhosis of liver: Secondary | ICD-10-CM

## 2022-04-26 DIAGNOSIS — K582 Mixed irritable bowel syndrome: Secondary | ICD-10-CM

## 2022-04-26 DIAGNOSIS — K7581 Nonalcoholic steatohepatitis (NASH): Secondary | ICD-10-CM | POA: Diagnosis not present

## 2022-04-26 DIAGNOSIS — Z299 Encounter for prophylactic measures, unspecified: Secondary | ICD-10-CM | POA: Diagnosis not present

## 2022-04-26 DIAGNOSIS — I700 Atherosclerosis of aorta: Secondary | ICD-10-CM | POA: Diagnosis not present

## 2022-04-26 DIAGNOSIS — K7469 Other cirrhosis of liver: Secondary | ICD-10-CM

## 2022-04-26 DIAGNOSIS — Z6822 Body mass index (BMI) 22.0-22.9, adult: Secondary | ICD-10-CM | POA: Diagnosis not present

## 2022-04-26 DIAGNOSIS — Z87891 Personal history of nicotine dependence: Secondary | ICD-10-CM | POA: Diagnosis not present

## 2022-04-26 MED ORDER — CARVEDILOL 3.125 MG PO TABS: 3.1250 mg | ORAL_TABLET | Freq: Two times a day (BID) | ORAL | 0 refills | Status: DC

## 2022-04-26 NOTE — Patient Instructions (Signed)
-   Continue Imodium as needed for diarrhea - Stop lisinopril, restart carvedilol 3.125 mg BID - Check CBC, MELD labs and AFP - proceed with scheduled liver US - Reduce salt intake to <2 g per day - Can take Tylenol max of 2 g per day (650 mg q8h) for pain - Avoid NSAIDs for pain - Avoid eating raw oysters/shellfish - Protein shake (Ensure or Boost) every night before going to sleep

## 2022-04-26 NOTE — Progress Notes (Signed)
[name], M.D. Gastroenterology & Hepatology [facility] [address] [place], [place] [address]  Primary Care Physician: Monico Blitz, [place] [id number]  I will communicate my assessment and recommendations to the referring MD via EMR.  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  The patient was last seen on 01/13/22. At that time, the patient was followed for possible infectious diarrhea, clinically seemed to be doing better so no further workup was performed.  Patient is still having some episodes of diarrhea intermittently, for which she takes Imodium AD. She has an average of 3-4 Bms per day but does not have any fecal soiling Skin accidents. Sometimes she can develop constipation after taking the Imodium, which eventually resolves. Family has tried to identify food triggers but have not been able to identify a specific trigger. She reports that Imodium helps for abdominal cramping and diarrhea.  States overall, she is not interested in proceeding with any endoscopic evaluation given the increased risk of bleeding as she has had severe thrombocytopenia chronically.  States that Dr. Manuella Ghazi stopped her coreg due to concern of hypotension. She is on lisinopril for management of blood pressure and is not taking any other medication for variceal bleeding prevention.  The patient denies having any nausea, vomiting, fever, chills, hematochezia, melena, hematemesis, abdominal distention, jaundice, pruritus. Weight has been stable.  Most recent labs from 03/25/2022 showed a WBC of 12.3, hemoglobin 10.5 and platelets of 37,000.  Iron stores showed a  ferritin of 321, iron of 20, iron saturation of 21%.  She is currently following with hematology at [facility] (Dr. Delton Coombes).  Anemia has been related to multiple reasons such as chronic iron losses, CKD and chronic liver disease.  Cirrhosis related questions: Hematemesis/coffee ground emesis: No Abdominal pain: No Abdominal distention/worsening ascitesNo Fever/chills: No Episodes of confusion/disorientation: No Taking diuretics?: Yestm 20 mg as needed History of variceal bleeding: No Prior history of banding?: No, but had grade 2 esophageal varices on treatment with beta-blockers in the past Prior episodes of SBP: No Last time liver imaging was performed:11/03/21 [place] - no masses Last AFP: 3/23 - 2.2 MELD score: 01/2021  - 8  Last EGD:06/17/2020 Grade II varices were found in the lower third of the esophagus. An area of irregularity was found in the posterior rim of the pyloric channed. This was visualized with the aid of a transparent cap. I pulled the area with a cold forceps and was able to see a medium-sized - 1 cm, frond-like/villous, non-circumferential mass with no bleeding and no stigmata of recent bleeding was found at the pylorus. It had some adenomatous features - unclear if coming from pylorus or from duodenal bulb. One biopsy was taken with a cold forceps for histology, no more biopsies were performed as patient presented significant bleeding that stopped on its own. The examined duodenum and ampulla were normal.   Path: Granulation tissue neg for malignancy or HP.   Last Colonoscopy:06/2019 - two small cecal and SF polyps, one cecal 6 mm polyp. All polyps were TA. Hemorrhoids  Past Medical History: Past Medical History:  Diagnosis Date   Abnormal LFTs    Achilles bursitis or tendinitis    Acute maxillary sinusitis  Acute pharyngitis    Anemia, unspecified    Candidiasis of skin and nails    Dermatophytosis of foot    Dermatophytosis of the body     Diverticulitis of colon (without mention of hemorrhage)(562.11)    Edema    Headache(784.0)    HOH (hard of hearing)    Malaise and fatigue    Occlusion and stenosis of carotid artery without mention of cerebral infarction    Osteoarthrosis, unspecified whether generalized or localized, unspecified site    Other dyspnea and respiratory abnormality    Other psoriasis    Other specified disorders of rotator cuff syndrome of shoulder and allied disorders    Pain in joint    Pure hypercholesterolemia    RUQ pain    Spasm of back muscles    Thrombocytopenia, unspecified (HCC)    Type II or unspecified type diabetes mellitus without mention of complication, not stated as uncontrolled    Unspecified essential hypertension    Unspecified sinusitis (chronic)    Urinary incontinence     Past Surgical History: Past Surgical History:  Procedure Laterality Date   APPENDECTOMY  1964   BIOPSY  03/02/2017   Procedure: BIOPSY;  Surgeon: Rogene Houston, MD;  [facility];  Service: Endoscopy;;  antral   BIOPSY  06/17/2020   Procedure: BIOPSY;  Surgeon: Harvel Quale, MD;  [facility];  Service: Gastroenterology;;   CAROTID ENDARTERECTOMY  2005   Left CEA   CAROTID ENDARTERECTOMY  2009   Right CEA   CATARACT EXTRACTION, BILATERAL Bilateral    CHOLECYSTECTOMY  1982   Gall Bladder   COLONOSCOPY  09   COLONOSCOPY N/A 06/27/2019   Procedure: COLONOSCOPY;  Surgeon: Rogene Houston, MD;  [facility];  Service: Endoscopy;  Laterality: N/A;   ESOPHAGOGASTRODUODENOSCOPY N/A 03/02/2017   Procedure: ESOPHAGOGASTRODUODENOSCOPY (EGD);  Surgeon: Rogene Houston, MD;  [facility];  Service: Endoscopy;  Laterality: N/A;  12:55   ESOPHAGOGASTRODUODENOSCOPY N/A 06/27/2019   Procedure: ESOPHAGOGASTRODUODENOSCOPY (EGD);  Surgeon: Rogene Houston, MD;  [facility];  Service: Endoscopy;  Laterality: N/A;  730   ESOPHAGOGASTRODUODENOSCOPY (EGD)  WITH PROPOFOL N/A 06/17/2020   Procedure: ESOPHAGOGASTRODUODENOSCOPY (EGD) WITH PROPOFOL;  Surgeon: Harvel Quale, MD;  [facility];  Service: Gastroenterology;  Laterality: N/A;  AM   GIVENS CAPSULE STUDY N/A 10/24/2019   Procedure: GIVENS CAPSULE STUDY;  Surgeon: Rogene Houston, MD;  [facility];  Service: Endoscopy;  Laterality: N/A;  730   KNEE ARTHROSCOPY WITH MEDIAL MENISECTOMY Left 07/10/2019   Procedure: KNEE ARTHROSCOPY WITH MEDIAL MENISCECTOMY;  Surgeon: Carole Civil, MD;  Location: [facility];  Service: Orthopedics;  Laterality: Left;   KNEE ARTHROSCOPY WITH MEDIAL MENISECTOMY Left 03/14/2020   Procedure: KNEE ARTHROSCOPY WITH MEDIAL MENISCECTOMY;  Surgeon: Carole Civil, MD;  Location: [facility];  Service: Orthopedics;  Laterality: Left;   NOSE SURGERY  1978   PARATHYROIDECTOMY  1992   POLYPECTOMY  03/02/2017   Procedure: POLYPECTOMY;  Surgeon: Rogene Houston, MD;  [facility];  Service: Endoscopy;;  gastric    POLYPECTOMY  06/27/2019   Procedure: POLYPECTOMY;  Surgeon: Rogene Houston, MD;  [facility];  Service: Endoscopy;;  colon   Power port  03/06/2009   Right upper chest    TONSILLECTOMY     TUMOR EXCISION Right August 24, 2013   [facility] Dr. Ellen Henri, ENT  VAGINAL HYSTERECTOMY  1972    Family History: Family History  Problem Relation Age of Onset   Heart attack Mother    Cancer Mother    Heart attack Father    Cancer Sister     Social History: Social History   Tobacco Use  Smoking Status Former   Packs/day: 1.00   Years: 42.00   Total pack years: 42.00   Types: Cigarettes   Quit date: 03/12/1992   Years since quitting: 30.1   Passive exposure: Past  Smokeless Tobacco Former   Quit date: 12/16/1992   Social History   Substance and Sexual Activity  Alcohol Use Yes   Alcohol/week: 1.0 standard drink of alcohol   Types: 1 Glasses of wine per week   Comment: very  seldom   Social History   Substance and Sexual Activity  Drug Use No    Allergies: Allergies  Allergen Reactions   Codeine Nausea And Vomiting and Rash   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   Toviaz [Fesoterodine Fumarate] Other (See Comments)    Cramps, severe headache   Norco [Hydrocodone-Acetaminophen] Nausea And Vomiting and Anxiety   Penicillins Swelling, Rash and Other (See Comments)    Has patient had a PCN reaction causing immediate rash, facial/tongue/throat swelling, SOB or lightheadedness with hypotension: No Has patient had a PCN reaction causing severe rash involving mucus membranes or skin necrosis: No Has patient had a PCN reaction that required hospitalization: Yes Has patient had a PCN reaction occurring within the last 10 years: No If all of the above answers are "NO", then may proceed with Cephalosporin use.    Ultram [Tramadol Hcl] Nausea And Vomiting    Medications: Current Outpatient Medications  Medication Sig Dispense Refill   acetaminophen (TYLENOL) 500 MG tablet Take 1 tablet (500 mg total) by mouth every 6 (six) hours as needed for moderate pain or headache. 30 tablet 0   aspirin EC 81 MG tablet Take 81 mg by mouth every other day. AT NIGHT.     Biotin 10 MG CAPS Take by mouth daily at 6 (six) AM.     cholecalciferol (VITAMIN D) 25 MCG (1000 UNIT) tablet Take 1,000 Units by  mouth daily.     dapagliflozin propanediol (FARXIGA) 5 MG TABS tablet Take 5 mg by mouth daily.     dicyclomine (BENTYL) 10 MG capsule TAKE 1 CAPSULE (10 MG TOTAL) BY MOUTH 3 (THREE) TIMES DAILY BEFORE MEALS. 270 capsule 0   furosemide (LASIX) 20 MG tablet Take 20 mg by mouth as needed.     gabapentin (NEURONTIN) 300 MG capsule Take 300 mg by mouth at bedtime.     lisinopril (ZESTRIL) 40 MG tablet Take 40 mg by mouth daily.     metFORMIN (GLUCOPHAGE) 500 MG tablet Take 1,000 mg by mouth 2 (two) times daily with a  meal.     OVER THE COUNTER MEDICATION Iron as needed. Not daily (Patient not taking: Reported on 04/26/2022)     OVER THE COUNTER MEDICATION Compounded Hemorrhoid cream [facility]) Apply rectally up to four times per day. (Patient not taking: Reported on 04/26/2022)     No current facility-administered medications for this visit.   Facility-Administered Medications Ordered in Other Visits  Medication Dose Route Frequency Provider Last Rate Last Admin   sodium chloride irrigation 0.9 %    PRN Carole Civil, MD   1,000 mL at 03/14/20 0945    Review of Systems: GENERAL: negative for malaise, night sweats HEENT: No changes in hearing or vision, no nose bleeds or other nasal problems. NECK: Negative for lumps, goiter, pain and significant neck swelling RESPIRATORY: Negative for cough, wheezing CARDIOVASCULAR: Negative for chest pain, leg swelling, palpitations, orthopnea GI: SEE HPI MUSCULOSKELETAL: Negative for joint pain or swelling, back pain, and muscle pain. SKIN: Negative for lesions, rash PSYCH: Negative for sleep disturbance, mood disorder and recent psychosocial stressors. HEMATOLOGY Negative for prolonged bleeding, bruising easily, and swollen nodes. ENDOCRINE: Negative for cold or heat intolerance, polyuria, polydipsia and goiter. NEURO: negative for tremor, gait imbalance, syncope and seizures. The remainder of the review of systems is  noncontributory.   Physical Exam: BP (!) 115/52 (BP Location: Left Arm, Patient Position: Sitting, Cuff Size: Normal)   Pulse 84   Temp (!) 97.5 F (36.4 C) (Temporal)   Ht '5\' 3"'$  (1.6 m)   Wt 126 lb 4.8 oz (57.3 kg)   BMI 22.37 kg/m  GENERAL: The patient is AO x3, in no acute distress. Elder. HEENT: Head is normocephalic and atraumatic. EOMI are intact. Mouth is well hydrated and without lesions. NECK: Supple. No masses LUNGS: Clear to auscultation. No presence of rhonchi/wheezing/rales. Adequate chest expansion HEART: RRR, normal s1 and s2. ABDOMEN: Soft, nontender, no guarding, no peritoneal signs, and nondistended. BS +. No masses. EXTREMITIES: Without any cyanosis, clubbing, rash, lesions or edema. NEUROLOGIC: AOx3, no focal motor deficit. SKIN: no jaundice, no rashes  Imaging/Labs: as above  I personally reviewed and interpreted the available labs, imaging and endoscopic files.  Impression and Plan: Chelsey Raycraft is a 82 y.o. 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  The patient has had stable liver cirrhosis with a low MELD score in the past.  She has evidence of severe portal hypertension given the presence of grade 2 varices and splenomegaly.  Fortunately she will has not presented any recent decompensating events.  We will recheck MELD labs today, as well as check AFP and liver ultrasound for [place] screening.  Given the fact that the patient had large varices during her most recent endoscopic evaluation and she has very high risk of intraprocedural bleeding if banding is performed, she was advised to take beta-blockers for variceal prophylaxis.  However she had her Coreg discontinued by her PCP due to hypotension.  I will favor using a beta-blocker at this point as this can prevent life-threatening bleeding.  The patient agreed with this.  Will stop her lisinopril  and start low-dose Coreg, which will need to be uptitrated as needed.  Finally, she has presented chronic diarrhea for multiple years.  He has been felt that this was related to IBS-D.  I discussed with the patient and family the possibility of treating this with a 2-week trial of Xifaxan trial, but they are  not interested in this.  She will continue with the use of Imodium as needed.  - Continue Imodium as needed for diarrhea - Stop lisinopril, restart carvedilol 3.125 mg BID - Check CBC, MELD labs and AFP - proceed with scheduled liver US - Reduce salt intake to <2 g per day - Can take Tylenol max of 2 g per day (650 mg q8h) for pain - Avoid NSAIDs for pain - Avoid eating raw oysters/shellfish - Protein shake (Ensure or Boost) every night before going to sleep  All questions were answered.      [name], MD Gastroenterology and Hepatology [facility]

## 2022-04-27 ENCOUNTER — Ambulatory Visit (HOSPITAL_COMMUNITY)
Admission: RE | Admit: 2022-04-27 | Discharge: 2022-04-27 | Disposition: A | Discharge status: Home / Self Care | Payer: Medicare Other | Source: Ambulatory Visit | Attending: Gastroenterology | Admitting: Gastroenterology

## 2022-04-27 DIAGNOSIS — K7581 Nonalcoholic steatohepatitis (NASH): Secondary | ICD-10-CM

## 2022-04-27 DIAGNOSIS — K7460 Unspecified cirrhosis of liver: Secondary | ICD-10-CM | POA: Diagnosis not present

## 2022-04-27 DIAGNOSIS — Z9049 Acquired absence of other specified parts of digestive tract: Secondary | ICD-10-CM | POA: Diagnosis not present

## 2022-04-27 LAB — CBC WITH DIFFERENTIAL/PLATELET
Absolute Monocytes: 152 cells/uL — ABNORMAL LOW (ref 200–950)
Basophils Absolute: 11 cells/uL (ref 0–200)
Basophils Relative: 0.5 %
Eosinophils Absolute: 42 cells/uL (ref 15–500)
Eosinophils Relative: 1.9 %
HCT: 31 % — ABNORMAL LOW (ref 35.0–45.0)
Hemoglobin: 10.2 g/dL — ABNORMAL LOW (ref 11.7–15.5)
Lymphs Abs: 713 cells/uL — ABNORMAL LOW (ref 850–3900)
MCH: 32.5 pg (ref 27.0–33.0)
MCHC: 32.9 g/dL (ref 32.0–36.0)
MCV: 98.7 fL (ref 80.0–100.0)
MPV: 11.6 fL (ref 7.5–12.5)
Monocytes Relative: 6.9 %
Neutro Abs: 1283 cells/uL — ABNORMAL LOW (ref 1500–7800)
Neutrophils Relative %: 58.3 %
Platelets: 48 10*3/uL — ABNORMAL LOW (ref 140–400)
RBC: 3.14 10*6/uL — ABNORMAL LOW (ref 3.80–5.10)
RDW: 14.3 % (ref 11.0–15.0)
Total Lymphocyte: 32.4 %
WBC: 2.2 10*3/uL — ABNORMAL LOW (ref 3.8–10.8)

## 2022-04-27 LAB — COMPREHENSIVE METABOLIC PANEL
AG Ratio: 1.5 (calc) (ref 1.0–2.5)
ALT: 30 U/L — ABNORMAL HIGH (ref 6–29)
AST: 36 U/L — ABNORMAL HIGH (ref 10–35)
Albumin: 4 g/dL (ref 3.6–5.1)
Alkaline phosphatase (APISO): 100 U/L (ref 37–153)
BUN: 24 mg/dL (ref 7–25)
CO2: 26 mmol/L (ref 20–32)
Calcium: 9.5 mg/dL (ref 8.6–10.4)
Chloride: 107 mmol/L (ref 98–110)
Creat: 0.9 mg/dL (ref 0.60–0.95)
Globulin: 2.6 g/dL (calc) (ref 1.9–3.7)
Glucose, Bld: 200 mg/dL — ABNORMAL HIGH (ref 65–99)
Potassium: 4.2 mmol/L (ref 3.5–5.3)
Sodium: 144 mmol/L (ref 135–146)
Total Bilirubin: 0.5 mg/dL (ref 0.2–1.2)
Total Protein: 6.6 g/dL (ref 6.1–8.1)

## 2022-04-27 LAB — AFP TUMOR MARKER: AFP-Tumor Marker: 2.3 ng/mL

## 2022-04-27 LAB — PROTIME-INR
INR: 1.1
Prothrombin Time: 11.9 s — ABNORMAL HIGH (ref 9.0–11.5)

## 2022-05-19 DIAGNOSIS — I10 Essential (primary) hypertension: Secondary | ICD-10-CM | POA: Diagnosis not present

## 2022-05-19 DIAGNOSIS — Z299 Encounter for prophylactic measures, unspecified: Secondary | ICD-10-CM | POA: Diagnosis not present

## 2022-05-19 DIAGNOSIS — R609 Edema, unspecified: Secondary | ICD-10-CM | POA: Diagnosis not present

## 2022-05-19 DIAGNOSIS — E1142 Type 2 diabetes mellitus with diabetic polyneuropathy: Secondary | ICD-10-CM | POA: Diagnosis not present

## 2022-05-31 ENCOUNTER — Inpatient Hospital Stay: Payer: Medicare Other | Attending: Hematology

## 2022-05-31 DIAGNOSIS — Z79899 Other long term (current) drug therapy: Secondary | ICD-10-CM | POA: Insufficient documentation

## 2022-05-31 DIAGNOSIS — D649 Anemia, unspecified: Secondary | ICD-10-CM

## 2022-05-31 DIAGNOSIS — D696 Thrombocytopenia, unspecified: Secondary | ICD-10-CM

## 2022-05-31 DIAGNOSIS — D509 Iron deficiency anemia, unspecified: Secondary | ICD-10-CM | POA: Diagnosis not present

## 2022-05-31 LAB — CBC
HCT: 25.7 % — ABNORMAL LOW (ref 36.0–46.0)
Hemoglobin: 8 g/dL — ABNORMAL LOW (ref 12.0–15.0)
MCH: 32 pg (ref 26.0–34.0)
MCHC: 31.1 g/dL (ref 30.0–36.0)
MCV: 102.8 fL — ABNORMAL HIGH (ref 80.0–100.0)
Platelets: 52 10*3/uL — ABNORMAL LOW (ref 150–400)
RBC: 2.5 MIL/uL — ABNORMAL LOW (ref 3.87–5.11)
RDW: 14.1 % (ref 11.5–15.5)
WBC: 1.6 10*3/uL — ABNORMAL LOW (ref 4.0–10.5)
nRBC: 0 % (ref 0.0–0.2)

## 2022-05-31 LAB — IRON AND TIBC
Iron: 47 ug/dL (ref 28–170)
Saturation Ratios: 14 % (ref 10.4–31.8)
TIBC: 329 ug/dL (ref 250–450)
UIBC: 282 ug/dL

## 2022-05-31 LAB — FERRITIN: Ferritin: 56 ng/mL (ref 11–307)

## 2022-06-01 DIAGNOSIS — R350 Frequency of micturition: Secondary | ICD-10-CM | POA: Diagnosis not present

## 2022-06-01 DIAGNOSIS — Z299 Encounter for prophylactic measures, unspecified: Secondary | ICD-10-CM | POA: Diagnosis not present

## 2022-06-01 DIAGNOSIS — D649 Anemia, unspecified: Secondary | ICD-10-CM | POA: Diagnosis not present

## 2022-06-01 DIAGNOSIS — R609 Edema, unspecified: Secondary | ICD-10-CM | POA: Diagnosis not present

## 2022-06-01 DIAGNOSIS — I10 Essential (primary) hypertension: Secondary | ICD-10-CM | POA: Diagnosis not present

## 2022-06-04 NOTE — Progress Notes (Signed)
[facility] [address], [place] [address]   CLINIC:  Medical Oncology/Hematology  PCP:  Kirstie Peri, MD [address] [place] [address] [phone]   REASON FOR VISIT:  Follow-up for pancytopenia  PRIOR THERAPY: Blood transfusions  CURRENT THERAPY: Intermittent IV iron  INTERVAL HISTORY:   Jeanette Chang 82 y.o. 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  She was last seen by Dr. Ellin Saba on 04/01/2022.  At today's visit, she reports feeling fairly.  No recent hospitalizations, surgeries, or changes in baseline health status.  However, she had significant emotional stressors due to the recent death of her husband and her son being placed on hospice.  She reports single episode of hematochezia 2 weeks ago.  Denies any melena.  She admits to easy bruising but denies any petechiae.  She reports of fatigue, pica, restless legs, headaches, dyspnea on exertion, and lightheadedness with standing.  No chest pain or syncopal episodes.  She has not had any recent infections, B symptoms, or lymphadenopathy.  She has 50% energy and 40% appetite. She endorses that she is maintaining a stable weight.   ASSESSMENT & PLAN:  1.   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  Normal duodenum. - Colonoscopy (06/29/2019): Perianal skin tags found on perianal exam.  2 small polyps at the splenic flexure.  External hemorrhoids. - BMBX (09/09/2020): Normocellular marrow (20%) with TLH, including minimal dyserythropoiesis and 1% blasts by manual aspirate differential.  Mild dyserythropoiesis which does not fulfill criteria for MDS.  Karyotype 45, X, -X[4]/46, XX[16], loss of 1 X chromosome is rare finding in the bone marrow is of older woman and when present as a minor clone (less than 50% of 20  metaphases) is thought to be most likely aging effect. - Labs from December 2023 showed normal B12 of, folate, copper, SPEP.  CKD stage IIIa/b. - History of PRBC transfusions x 2 in October 2023 - She received Feraheme in December 2023. - Most recent labs (05/31/2022): Hgb 8.0/MCV 102.8, ferritin 56, iron saturation 14% -Single episode of hematochezia in April 2024.  No melena. - PLAN: Recommend IV Feraheme x 3 - CBC/differential + BB sample every 2 weeks  - Recheck CBC and iron panel with RTC in 2 months   2.  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  Normal duodenum. - Colonoscopy (06/29/2019): Perianal skin tags found on perianal exam.  2 small polyps at the splenic flexure.  External hemorrhoids. - BMBX (09/09/2020): Normocellular marrow (20%) with TLH, including minimal dyserythropoiesis and 1% blasts by manual aspirate differential.  Mild dyserythropoiesis which does not fulfill criteria for MDS.  Karyotype 45, X, -X[4]/46, XX[16], loss of 1 X chromosome is rare finding in the bone marrow is of older woman and when present as a minor clone (less than 50% of 20 metaphases) is thought to be most likely aging effect. - CT AP (04/24/2021): Spleen enlarged measuring 14.1 cm with volume 632 mL.  Liver has nodular contour consistent with cirrhosis. - Labs from December 2023 showed normal B12 of, folate, copper, SPEP.  CKD stage IIIa/b. -vNo B symptoms or recent infections. - Most recent labs (05/31/2022): Platelets 52, WBC 1.6 - PLAN: We will continue active surveillance with repeat CBC/differential in  2 months   3.  Social/family history: - Other PMH includes NASH cirrhosis, diabetes, arthritis, carotid artery disease her daughter lives with her at home.  She is independent of ADLs and IADLs.  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  Quit smoking in  November 1992. - Maternal grandmother had low platelets.  Sister had low platelet. - Mother had colon cancer.  Sister had stomach cancer.  Paternal uncle had lung cancer.   PLAN SUMMARY: >> IV Feraheme x 3 >> CBC + BB sample every 2 weeks >> Labs in 2 months = CBC/D, ferritin, iron/TIBC >> OFFICE visit in 2 months     REVIEW OF SYSTEMS:   Review of Systems  Constitutional:  Positive for fatigue. Negative for appetite change, chills, diaphoresis, fever and unexpected weight change.  HENT:   Negative for lump/mass and nosebleeds.   Eyes:  Negative for eye problems.  Respiratory:  Positive for shortness of breath. Negative for cough and hemoptysis.   Cardiovascular:  Negative for chest pain, leg swelling and palpitations.  Gastrointestinal:  Negative for abdominal pain, blood in stool, constipation, diarrhea, nausea and vomiting.  Genitourinary:  Negative for hematuria.   Skin: Negative.   Neurological:  Positive for dizziness and headaches. Negative for light-headedness.  Hematological:  Does not bruise/bleed easily.  Psychiatric/Behavioral:  Positive for depression and sleep disturbance. The patient is nervous/anxious.      PHYSICAL EXAM:  ECOG PERFORMANCE STATUS: 1 - Symptomatic but completely ambulatory  There were no vitals filed for this visit. There were no vitals filed for this visit. Physical Exam Constitutional:      Appearance: Normal appearance. She is normal weight.  Cardiovascular:     Heart sounds: Normal heart sounds.  Pulmonary:     Breath sounds: Normal breath sounds.  Neurological:     General: No focal deficit present.     Mental Status: Mental status is at baseline.  Psychiatric:        Behavior: Behavior normal. Behavior is cooperative.     PAST MEDICAL/SURGICAL HISTORY:  Past Medical History:  Diagnosis Date   Abnormal LFTs    Achilles bursitis or tendinitis    Acute maxillary sinusitis    Acute pharyngitis    Anemia, unspecified     Candidiasis of skin and nails    Dermatophytosis of foot    Dermatophytosis of the body    Diverticulitis of colon (without mention of hemorrhage)(562.11)    Edema    Headache(784.0)    HOH (hard of hearing)    Malaise and fatigue    Occlusion and stenosis of carotid artery without mention of cerebral infarction    Osteoarthrosis, unspecified whether generalized or localized, unspecified site    Other dyspnea and respiratory abnormality    Other psoriasis    Other specified disorders of rotator cuff syndrome of shoulder and allied disorders    Pain in joint    Pure hypercholesterolemia    RUQ pain    Spasm of back muscles    Thrombocytopenia, unspecified (HCC)    Type II or unspecified type diabetes mellitus without mention of complication, not stated as uncontrolled    Unspecified essential hypertension    Unspecified sinusitis (chronic)    Urinary incontinence    Past Surgical History:  Procedure Laterality Date   APPENDECTOMY  1964   BIOPSY  03/02/2017   Procedure: BIOPSY;  Surgeon: Malissa Hippo, MD;  [facility];  Service: Endoscopy;;  antral  BIOPSY  06/17/2020   Procedure: BIOPSY;  Surgeon: Marguerita Merles, Reuel Boom, MD;  [facility];  Service: Gastroenterology;;   CAROTID ENDARTERECTOMY  2005   Left CEA   CAROTID ENDARTERECTOMY  2009   Right CEA   CATARACT EXTRACTION, BILATERAL Bilateral    CHOLECYSTECTOMY  1982   Gall Bladder   COLONOSCOPY  09   COLONOSCOPY N/A 06/27/2019   Procedure: COLONOSCOPY;  Surgeon: Malissa Hippo, MD;  [facility];  Service: Endoscopy;  Laterality: N/A;   ESOPHAGOGASTRODUODENOSCOPY N/A 03/02/2017   Procedure: ESOPHAGOGASTRODUODENOSCOPY (EGD);  Surgeon: Malissa Hippo, MD;  [facility];  Service: Endoscopy;  Laterality: N/A;  12:55   ESOPHAGOGASTRODUODENOSCOPY N/A 06/27/2019   Procedure: ESOPHAGOGASTRODUODENOSCOPY (EGD);  Surgeon: Malissa Hippo, MD;  [facility];  Service:  Endoscopy;  Laterality: N/A;  730   ESOPHAGOGASTRODUODENOSCOPY (EGD) WITH PROPOFOL N/A 06/17/2020   Procedure: ESOPHAGOGASTRODUODENOSCOPY (EGD) WITH PROPOFOL;  Surgeon: Dolores Frame, MD;  [facility];  Service: Gastroenterology;  Laterality: N/A;  AM   GIVENS CAPSULE STUDY N/A 10/24/2019   Procedure: GIVENS CAPSULE STUDY;  Surgeon: Malissa Hippo, MD;  [facility];  Service: Endoscopy;  Laterality: N/A;  730   KNEE ARTHROSCOPY WITH MEDIAL MENISECTOMY Left 07/10/2019   Procedure: KNEE ARTHROSCOPY WITH MEDIAL MENISCECTOMY;  Surgeon: Vickki Hearing, MD;  Location: [facility];  Service: Orthopedics;  Laterality: Left;   KNEE ARTHROSCOPY WITH MEDIAL MENISECTOMY Left 03/14/2020   Procedure: KNEE ARTHROSCOPY WITH MEDIAL MENISCECTOMY;  Surgeon: Vickki Hearing, MD;  [facility]: [facility];  Service: Orthopedics;  Laterality: Left;   NOSE SURGERY  1978   PARATHYROIDECTOMY  1992   POLYPECTOMY  03/02/2017   Procedure: POLYPECTOMY;  Surgeon: Malissa Hippo, MD;  [facility];  Service: Endoscopy;;  gastric    POLYPECTOMY  06/27/2019   Procedure: POLYPECTOMY;  Surgeon: Malissa Hippo, MD;  [facility];  Service: Endoscopy;;  colon   Power port  03/06/2009   Right upper chest    TONSILLECTOMY     TUMOR EXCISION Right August 24, 2013   [facility]   VAGINAL HYSTERECTOMY  [phone]    SOCIAL HISTORY:  Social History   Socioeconomic History   Marital status: Widowed    Spouse name: Not on file   Number of children: Not on file   Years of education: Not on file   Highest education level: Not on file  Occupational History   Not on file  Tobacco Use   Smoking status: Former    Packs/day: 1.00    Years: 42.00    Additional pack years: 0.00    Total pack years: 42.00    Types: Cigarettes    Quit date: 03/12/1992    Years since quitting: 30.2    Passive exposure: Past   Smokeless tobacco: Former    Quit  date: 12/16/1992  Vaping Use   Vaping Use: Never used  Substance and Sexual Activity   Alcohol use: Yes    Alcohol/week: 1.0 standard drink of alcohol    Types: 1 Glasses of wine per week    Comment: very seldom   Drug use: No   Sexual activity: Not Currently  Other Topics Concern   Not on file  Social History Narrative   Not on file   Social Determinants of Health   Financial Resource Strain: Not on file  Food Insecurity: Not on file  Transportation Needs: Not on file  Physical Activity: Not on file  Stress: Not on file  Social Connections: Not on file  Intimate Partner Violence: Not on file    FAMILY HISTORY:  Family History  Problem Relation Age of Onset   Heart attack Mother    Cancer Mother    Heart attack Father    Cancer Sister     CURRENT MEDICATIONS:  Outpatient Encounter Medications as of 06/07/2022  Medication Sig Note   acetaminophen (TYLENOL) 500 MG tablet Take 1 tablet (500 mg total) by mouth every 6 (six) hours as needed for moderate pain or headache.    aspirin EC 81 MG tablet Take 81 mg by mouth every other day. AT NIGHT.    Biotin 10 MG CAPS Take by mouth daily at 6 (six) AM.    carvedilol (COREG) 3.125 MG tablet Take 1 tablet (3.125 mg total) by mouth 2 (two) times daily with a meal.    cholecalciferol (VITAMIN D) 25 MCG (1000 UNIT) tablet Take 1,000 Units by mouth daily.    dapagliflozin propanediol (FARXIGA) 5 MG TABS tablet Take 5 mg by mouth daily.    dicyclomine (BENTYL) 10 MG capsule TAKE 1 CAPSULE (10 MG TOTAL) BY MOUTH 3 (THREE) TIMES DAILY BEFORE MEALS.    furosemide (LASIX) 20 MG tablet Take 20 mg by mouth as needed.    gabapentin (NEURONTIN) 300 MG capsule Take 300 mg by mouth at bedtime.    metFORMIN (GLUCOPHAGE) 500 MG tablet Take 1,000 mg by mouth 2 (two) times daily with a meal.    OVER THE COUNTER MEDICATION Iron as needed. Not daily (Patient not taking: Reported on 04/26/2022)    OVER THE COUNTER MEDICATION Compounded Hemorrhoid cream  [facility]) Apply rectally up to four times per day. (Patient not taking: Reported on 04/26/2022) 04/26/2022: prn   Facility-Administered Encounter Medications as of 06/07/2022  Medication   sodium chloride irrigation 0.9 %    ALLERGIES:  Allergies  Allergen Reactions   Codeine Nausea And Vomiting and Rash   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   Toviaz [Fesoterodine Fumarate] Other (See Comments)    Cramps, severe headache   Norco [Hydrocodone-Acetaminophen] Nausea And Vomiting and Anxiety   Penicillins Swelling, Rash and Other (See Comments)    Has patient had a PCN reaction causing immediate rash, facial/tongue/throat swelling, SOB or lightheadedness with hypotension: No Has patient had a PCN reaction causing severe rash involving mucus membranes or skin necrosis: No Has patient had a PCN reaction that required hospitalization: Yes Has patient had a PCN reaction occurring within the last 10 years: No If all of the above answers are "NO", then may proceed with Cephalosporin use.    Ultram [Tramadol Hcl] Nausea And Vomiting    LABORATORY DATA:  I have reviewed the labs as listed.  CBC  Component Value Date/Time   WBC 1.6 (L) 05/31/2022 1055   RBC 2.50 (L) 05/31/2022 1055   HGB 8.0 (L) 05/31/2022 1055   HCT 25.7 (L) 05/31/2022 1055   PLT 52 (L) 05/31/2022 1055   MCV 102.8 (H) 05/31/2022  1055   MCH 32.0 05/31/2022 1055   MCHC 31.1 05/31/2022 1055   RDW 14.1 05/31/2022 1055   LYMPHSABS 713 (L) 04/26/2022 1327   MONOABS 0.1 03/25/2022 1317   EOSABS 42 04/26/2022 1327   BASOSABS 11 04/26/2022 1327      Latest Ref Rng & Units 04/26/2022    1:27 PM 04/24/2021    3:44 PM 10/10/2020    9:29 AM  CMP  Glucose 65 - 99 mg/dL 161   75   BUN 7 - 25 mg/dL 24   32   Creatinine 0.96 - 0.95 mg/dL 0.45  4.09  8.11   Sodium 135 - 146 mmol/L 144   144   Potassium 3.5 - 5.3 mmol/L 4.2   4.2   Chloride 98 - 110 mmol/L 107   106   CO2 20 - 32 mmol/L 26   27   Calcium 8.6 - 10.4 mg/dL 9.5   9.2   Total Protein 6.1 - 8.1 g/dL 6.6   6.8   Total Bilirubin 0.2 - 1.2 mg/dL 0.5   0.4   AST 10 - 35 U/L 36   24   ALT 6 - 29 U/L 30   18     DIAGNOSTIC IMAGING:  I have independently reviewed the relevant imaging and discussed with the patient.   WRAP UP:  All questions were answered. The patient knows to call the clinic with any problems, questions or concerns.  Medical decision making: Moderate  Time spent on visit: I spent 20 minutes counseling the patient face to face. The total time spent in the appointment was 30 minutes and more than 50% was on counseling.  Carnella Guadalajara, PA-C  06/07/2022 9:24 AM

## 2022-06-07 ENCOUNTER — Other Ambulatory Visit: Payer: Self-pay

## 2022-06-07 ENCOUNTER — Inpatient Hospital Stay (HOSPITAL_BASED_OUTPATIENT_CLINIC_OR_DEPARTMENT_OTHER): Payer: Medicare Other | Admitting: Physician Assistant

## 2022-06-07 ENCOUNTER — Inpatient Hospital Stay: Payer: Medicare Other

## 2022-06-07 VITALS — BP 134/53 | HR 71 | Temp 97.7°F | Resp 18

## 2022-06-07 VITALS — BP 131/45 | HR 70 | Temp 97.6°F | Resp 16 | Ht 63.0 in | Wt 131.4 lb

## 2022-06-07 DIAGNOSIS — D500 Iron deficiency anemia secondary to blood loss (chronic): Secondary | ICD-10-CM

## 2022-06-07 DIAGNOSIS — D72818 Other decreased white blood cell count: Secondary | ICD-10-CM | POA: Diagnosis not present

## 2022-06-07 DIAGNOSIS — D509 Iron deficiency anemia, unspecified: Secondary | ICD-10-CM | POA: Diagnosis not present

## 2022-06-07 DIAGNOSIS — D696 Thrombocytopenia, unspecified: Secondary | ICD-10-CM | POA: Diagnosis not present

## 2022-06-07 DIAGNOSIS — Z79899 Other long term (current) drug therapy: Secondary | ICD-10-CM | POA: Diagnosis not present

## 2022-06-07 MED ORDER — CETIRIZINE HCL 10 MG PO TABS
10.0000 mg | ORAL_TABLET | Freq: Once | ORAL | Status: AC
  Administered 2022-06-07: 10 mg via ORAL
  Filled 2022-06-07: qty 1

## 2022-06-07 MED ORDER — SODIUM CHLORIDE 0.9 % IV SOLN: Freq: Once | INTRAVENOUS | Status: AC

## 2022-06-07 MED ORDER — ACETAMINOPHEN 325 MG PO TABS
650.0000 mg | ORAL_TABLET | Freq: Once | ORAL | Status: AC
  Administered 2022-06-07: 650 mg via ORAL
  Filled 2022-06-07: qty 2

## 2022-06-07 MED ORDER — SODIUM CHLORIDE 0.9 % IV SOLN
510.0000 mg | Freq: Once | INTRAVENOUS | Status: AC
  Administered 2022-06-07: 510 mg via INTRAVENOUS
  Filled 2022-06-07: qty 510

## 2022-06-07 MED ORDER — LORATADINE 10 MG PO TABS: 10.0000 mg | ORAL_TABLET | Freq: Once | ORAL | Status: DC

## 2022-06-07 NOTE — Patient Instructions (Signed)
[facility]  Discharge Instructions: Thank you for choosing [facility] to provide your oncology and hematology care.  If you have a lab appointment with the Cancer Center - please note that after April 8th, 2024, all labs will be drawn in the cancer center.  You do not have to check in or register with the main entrance as you have in the past but will complete your check-in in the cancer center.  Wear comfortable clothing and clothing appropriate for easy access to any Portacath or PICC line.   We strive to give you quality time with your provider. You may need to reschedule your appointment if you arrive late (15 or more minutes).  Arriving late affects you and other patients whose appointments are after yours.  Also, if you miss three or more appointments without notifying the office, you may be dismissed from the clinic at the provider's discretion.      For prescription refill requests, have your pharmacy contact our office and allow 72 hours for refills to be completed.    Today you received Feraheme IV iron infusion.   .  BELOW ARE SYMPTOMS THAT SHOULD BE REPORTED IMMEDIATELY: *FEVER GREATER THAN 100.4 F (38 C) OR HIGHER *CHILLS OR SWEATING *NAUSEA AND VOMITING THAT IS NOT CONTROLLED WITH YOUR NAUSEA MEDICATION *UNUSUAL SHORTNESS OF BREATH *UNUSUAL BRUISING OR BLEEDING *URINARY PROBLEMS (pain or burning when urinating, or frequent urination) *BOWEL PROBLEMS (unusual diarrhea, constipation, pain near the anus) TENDERNESS IN MOUTH AND THROAT WITH OR WITHOUT PRESENCE OF ULCERS (sore throat, sores in mouth, or a toothache) UNUSUAL RASH, SWELLING OR PAIN  UNUSUAL VAGINAL DISCHARGE OR ITCHING   Items with * indicate a potential emergency and should be followed up as soon as possible or go to the Emergency Department if any problems should occur.  Please show the CHEMOTHERAPY ALERT CARD or IMMUNOTHERAPY ALERT CARD at check-in to the Emergency  Department and triage nurse.  Should you have questions after your visit or need to cancel or reschedule your appointment, please contact [facility] [phone]  and follow the prompts.  Office hours are 8:00 a.m. to 4:30 p.m. Monday - Friday. Please note that voicemails left after 4:00 p.m. may not be returned until the following business day.  We are closed weekends and major holidays. You have access to a nurse at all times for urgent questions. Please call the main number to the clinic [phone] and follow the prompts.  For any non-urgent questions, you may also contact your provider using MyChart. We now offer e-Visits for anyone 18 and older to request care online for non-urgent symptoms. For details visit [email or web address].   Also download the MyChart app! Go to the app store, search "MyChart", open the app, select [facility], and log in with your MyChart username and password.   

## 2022-06-07 NOTE — Progress Notes (Signed)
Pharmacy has substituted cetirizine 10 mg orally x 1 as premedication for   Loratidine discontinued.  V.O. Rebekah Pennington, PA-C/Athleen Feltner, PharmD  

## 2022-06-07 NOTE — Patient Instructions (Signed)
[facility] **VISIT SUMMARY & IMPORTANT INSTRUCTIONS **   You were seen today by Rojelio Brenner PA-C for your follow-up visit.    IRON DEFICIENCY ANEMIA Your blood and iron levels are significantly lower than they were 2 months ago.  This is suspicious for bleeding from your stomach and intestines. We will schedule you for IV iron x 3 doses. We will check your blood counts once every 2 weeks to see if you need a blood transfusion.  LOW PLATELETS & WHITE BLOOD CELLS Your low platelets and low white blood cells are overall stable. 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  FOLLOW-UP APPOINTMENT: Labs and follow-up visit in 2 months.  ** Thank you for trusting me with your healthcare!  I strive to provide all of my patients with quality care at each visit.  If you receive a survey for this visit, I would be so grateful to you for taking the time to provide feedback.  Thank you in advance!  ~ [place]                   Dr. Doreatha Massed   &   Rojelio Brenner, PA-C   - - - - - - - - - - - - - - - - - -    Thank you for choosing [facility] at [facility] to provide your oncology and hematology care.  To afford each patient quality time with our provider, please arrive at least 15 minutes before your scheduled appointment time.   If you have a lab appointment with the Cancer Center please come in thru the Main Entrance and check in at the main information desk.  You need to re-schedule your appointment should you arrive 10 or more minutes late.  We strive to give you quality time with our providers, and arriving late affects you and other patients whose appointments are after yours.  Also, if you no show three or more times for appointments you may be dismissed from the clinic at the providers discretion.      Again, thank you for choosing [facility].  Our hope is that these requests will decrease the amount of time that you wait before being seen by our physicians.       _____________________________________________________________  Should you have questions after your visit to [facility], please contact our office at [phone] and follow the prompts.  Our office hours are 8:00 a.m. and 4:30 p.m. Monday - Friday.  Please note that voicemails left after 4:00 p.m. may not be returned until the following business day.  We are closed weekends and major holidays.  You do have access to a nurse 24-7, just call the main number to the clinic [phone] and do not press any options, hold on the line and a nurse will answer the phone.    For prescription refill requests, have your pharmacy contact our office and allow 72 hours.

## 2022-06-07 NOTE — Progress Notes (Signed)
Patient presents today for iron infusion. Patient is in satisfactory condition with no new complaints voiced.  Vital signs are stable.  We will proceed with infusion per provider orders.    Peripheral IV started with good blood return pre and post infusion.  Feraheme given today per MD orders. Tolerated infusion without adverse affects. Vital signs stable. No complaints at this time. Discharged from clinic ambulatory in stable condition. Alert and oriented x 3. F/U with [facility] as scheduled.   

## 2022-06-14 ENCOUNTER — Inpatient Hospital Stay: Payer: Medicare Other

## 2022-06-16 ENCOUNTER — Inpatient Hospital Stay: Payer: Medicare Other | Attending: Hematology

## 2022-06-16 VITALS — BP 121/44 | HR 62 | Temp 98.6°F | Resp 16

## 2022-06-16 DIAGNOSIS — D509 Iron deficiency anemia, unspecified: Secondary | ICD-10-CM | POA: Diagnosis not present

## 2022-06-16 MED ORDER — SODIUM CHLORIDE 0.9 % IV SOLN: Freq: Once | INTRAVENOUS | Status: AC

## 2022-06-16 MED ORDER — ACETAMINOPHEN 325 MG PO TABS
650.0000 mg | ORAL_TABLET | Freq: Once | ORAL | Status: AC
  Administered 2022-06-16: 650 mg via ORAL
  Filled 2022-06-16: qty 2

## 2022-06-16 MED ORDER — CETIRIZINE HCL 10 MG PO TABS
10.0000 mg | ORAL_TABLET | Freq: Once | ORAL | Status: AC
  Administered 2022-06-16: 10 mg via ORAL
  Filled 2022-06-16: qty 1

## 2022-06-16 MED ORDER — SODIUM CHLORIDE 0.9 % IV SOLN
510.0000 mg | Freq: Once | INTRAVENOUS | Status: AC
  Administered 2022-06-16: 510 mg via INTRAVENOUS
  Filled 2022-06-16: qty 17

## 2022-06-16 NOTE — Patient Instructions (Signed)
[facility] [place]  Discharge Instructions: Thank you for choosing [facility] to provide your oncology and hematology care.  If you have a lab appointment with the Cancer Center - please note that after April 8th, 2024, all labs will be drawn in the cancer center.  You do not have to check in or register with the main entrance as you have in the past but will complete your check-in in the cancer center.  Wear comfortable clothing and clothing appropriate for easy access to any Portacath or PICC line.   We strive to give you quality time with your provider. You may need to reschedule your appointment if you arrive late (15 or more minutes).  Arriving late affects you and other patients whose appointments are after yours.  Also, if you miss three or more appointments without notifying the office, you may be dismissed from the clinic at the provider's discretion.      For prescription refill requests, have your pharmacy contact our office and allow 72 hours for refills to be completed.    Today you received Feraheme IV iron infusion.   .  BELOW ARE SYMPTOMS THAT SHOULD BE REPORTED IMMEDIATELY: *FEVER GREATER THAN 100.4 F (38 C) OR HIGHER *CHILLS OR SWEATING *NAUSEA AND VOMITING THAT IS NOT CONTROLLED WITH YOUR NAUSEA MEDICATION *UNUSUAL SHORTNESS OF BREATH *UNUSUAL BRUISING OR BLEEDING *URINARY PROBLEMS (pain or burning when urinating, or frequent urination) *BOWEL PROBLEMS (unusual diarrhea, constipation, pain near the anus) TENDERNESS IN MOUTH AND THROAT WITH OR WITHOUT PRESENCE OF ULCERS (sore throat, sores in mouth, or a toothache) UNUSUAL RASH, SWELLING OR PAIN  UNUSUAL VAGINAL DISCHARGE OR ITCHING   Items with * indicate a potential emergency and should be followed up as soon as possible or go to the Emergency Department if any problems should occur.  Please show the CHEMOTHERAPY ALERT CARD or IMMUNOTHERAPY ALERT CARD at check-in to the Emergency  Department and triage nurse.  Should you have questions after your visit or need to cancel or reschedule your appointment, please contact [facility] AT  [phone]  and follow the prompts.  Office hours are 8:00 a.m. to 4:30 p.m. Monday - Friday. Please note that voicemails left after 4:00 p.m. may not be returned until the following business day.  We are closed weekends and major holidays. You have access to a nurse at all times for urgent questions. Please call the main number to the clinic [phone] and follow the prompts.  For any non-urgent questions, you may also contact your provider using MyChart. We now offer e-Visits for anyone 18 and older to request care online for non-urgent symptoms. For details visit [email or web address].   Also download the MyChart app! Go to the app store, search "MyChart", open the app, select [place], and log in with your MyChart username and password.   

## 2022-06-16 NOTE — Progress Notes (Signed)
Patient presents today for iron infusion. Patient is in satisfactory condition with no new complaints voiced.  Vital signs are stable.  We will proceed with infusion per provider orders.    Peripheral IV started with good blood return pre and post infusion.  Feraheme given today per MD orders. Tolerated infusion without adverse affects. Vital signs stable. No complaints at this time. Discharged from clinic ambulatory in stable condition. Alert and oriented x 3. F/U with [facility] as scheduled.   

## 2022-06-18 ENCOUNTER — Other Ambulatory Visit: Payer: Self-pay

## 2022-06-18 DIAGNOSIS — D509 Iron deficiency anemia, unspecified: Secondary | ICD-10-CM

## 2022-06-21 ENCOUNTER — Inpatient Hospital Stay: Payer: Medicare Other

## 2022-06-21 DIAGNOSIS — D509 Iron deficiency anemia, unspecified: Secondary | ICD-10-CM

## 2022-06-21 LAB — CBC WITH DIFFERENTIAL/PLATELET
Abs Immature Granulocytes: 0.02 10*3/uL (ref 0.00–0.07)
Basophils Absolute: 0 10*3/uL (ref 0.0–0.1)
Basophils Relative: 1 %
Eosinophils Absolute: 0.1 10*3/uL (ref 0.0–0.5)
Eosinophils Relative: 4 %
HCT: 27.9 % — ABNORMAL LOW (ref 36.0–46.0)
Hemoglobin: 8.4 g/dL — ABNORMAL LOW (ref 12.0–15.0)
Immature Granulocytes: 1 %
Lymphocytes Relative: 35 %
Lymphs Abs: 0.7 10*3/uL (ref 0.7–4.0)
MCH: 31.9 pg (ref 26.0–34.0)
MCHC: 30.1 g/dL (ref 30.0–36.0)
MCV: 106.1 fL — ABNORMAL HIGH (ref 80.0–100.0)
Monocytes Absolute: 0.2 10*3/uL (ref 0.1–1.0)
Monocytes Relative: 9 %
Neutro Abs: 1 10*3/uL — ABNORMAL LOW (ref 1.7–7.7)
Neutrophils Relative %: 50 %
Platelets: 58 10*3/uL — ABNORMAL LOW (ref 150–400)
RBC: 2.63 MIL/uL — ABNORMAL LOW (ref 3.87–5.11)
RDW: 16.2 % — ABNORMAL HIGH (ref 11.5–15.5)
WBC: 2 10*3/uL — ABNORMAL LOW (ref 4.0–10.5)
nRBC: 0 % (ref 0.0–0.2)

## 2022-06-21 LAB — SAMPLE TO BLOOD BANK

## 2022-06-23 ENCOUNTER — Inpatient Hospital Stay: Payer: Medicare Other

## 2022-06-23 VITALS — BP 137/53 | HR 74 | Temp 98.0°F | Resp 18

## 2022-06-23 DIAGNOSIS — D509 Iron deficiency anemia, unspecified: Secondary | ICD-10-CM | POA: Diagnosis not present

## 2022-06-23 MED ORDER — SODIUM CHLORIDE 0.9 % IV SOLN
510.0000 mg | Freq: Once | INTRAVENOUS | Status: AC
  Administered 2022-06-23: 510 mg via INTRAVENOUS
  Filled 2022-06-23: qty 510

## 2022-06-23 MED ORDER — ACETAMINOPHEN 325 MG PO TABS
650.0000 mg | ORAL_TABLET | Freq: Once | ORAL | Status: AC
  Administered 2022-06-23: 650 mg via ORAL
  Filled 2022-06-23: qty 2

## 2022-06-23 MED ORDER — CETIRIZINE HCL 10 MG PO TABS
10.0000 mg | ORAL_TABLET | Freq: Once | ORAL | Status: AC
  Administered 2022-06-23: 10 mg via ORAL
  Filled 2022-06-23: qty 1

## 2022-06-23 MED ORDER — SODIUM CHLORIDE 0.9 % IV SOLN: Freq: Once | INTRAVENOUS | Status: AC

## 2022-06-23 NOTE — Progress Notes (Signed)
Patient presents today for Feraheme infusion per providers order.  Vital signs WNL.  Patient has no new complaints at this time.  Peripheral IV started and blood return noted pre and post infusion.  Stable during infusion without adverse affects.  Vital signs stable.  No complaints at this time.  Discharge from clinic ambulatory in stable condition.  Alert and oriented X 3.  Follow up with [facility] as scheduled.  

## 2022-06-23 NOTE — Patient Instructions (Signed)
[facility]  Discharge Instructions: Thank you for choosing [facility] to provide your oncology and hematology care.  If you have a lab appointment with the Cancer Center - please note that after April 8th, 2024, all labs will be drawn in the cancer center.  You do not have to check in or register with the main entrance as you have in the past but will complete your check-in in the cancer center.  Wear comfortable clothing and clothing appropriate for easy access to any Portacath or PICC line.   We strive to give you quality time with your provider. You may need to reschedule your appointment if you arrive late (15 or more minutes).  Arriving late affects you and other patients whose appointments are after yours.  Also, if you miss three or more appointments without notifying the office, you may be dismissed from the clinic at the provider's discretion.      For prescription refill requests, have your pharmacy contact our office and allow 72 hours for refills to be completed.    Today you received the following chemotherapy and/or immunotherapy agents feraheme      To help prevent nausea and vomiting after your treatment, we encourage you to take your nausea medication as directed.  BELOW ARE SYMPTOMS THAT SHOULD BE REPORTED IMMEDIATELY: *FEVER GREATER THAN 100.4 F (38 C) OR HIGHER *CHILLS OR SWEATING *NAUSEA AND VOMITING THAT IS NOT CONTROLLED WITH YOUR NAUSEA MEDICATION *UNUSUAL SHORTNESS OF BREATH *UNUSUAL BRUISING OR BLEEDING *URINARY PROBLEMS (pain or burning when urinating, or frequent urination) *BOWEL PROBLEMS (unusual diarrhea, constipation, pain near the anus) TENDERNESS IN MOUTH AND THROAT WITH OR WITHOUT PRESENCE OF ULCERS (sore throat, sores in mouth, or a toothache) UNUSUAL RASH, SWELLING OR PAIN  UNUSUAL VAGINAL DISCHARGE OR ITCHING   Items with * indicate a potential emergency and should be followed up as soon as possible or go to the  Emergency Department if any problems should occur.  Please show the CHEMOTHERAPY ALERT CARD or IMMUNOTHERAPY ALERT CARD at check-in to the Emergency Department and triage nurse.  Should you have questions after your visit or need to cancel or reschedule your appointment, please contact [facility] [phone]  and follow the prompts.  Office hours are 8:00 a.m. to 4:30 p.m. Monday - Friday. Please note that voicemails left after 4:00 p.m. may not be returned until the following business day.  We are closed weekends and major holidays. You have access to a nurse at all times for urgent questions. Please call the main number to the clinic [phone] and follow the prompts.  For any non-urgent questions, you may also contact your provider using MyChart. We now offer e-Visits for anyone 93 and older to request care online for non-urgent symptoms. For details visit [email or web address].   Also download the MyChart app! Go to the app store, search "MyChart", open the app, select [place], and log in with your MyChart username and password.

## 2022-07-01 ENCOUNTER — Other Ambulatory Visit: Payer: Self-pay

## 2022-07-01 DIAGNOSIS — D500 Iron deficiency anemia secondary to blood loss (chronic): Secondary | ICD-10-CM

## 2022-07-01 DIAGNOSIS — D509 Iron deficiency anemia, unspecified: Secondary | ICD-10-CM

## 2022-07-02 DIAGNOSIS — Z299 Encounter for prophylactic measures, unspecified: Secondary | ICD-10-CM | POA: Diagnosis not present

## 2022-07-02 DIAGNOSIS — D509 Iron deficiency anemia, unspecified: Secondary | ICD-10-CM | POA: Diagnosis not present

## 2022-07-02 DIAGNOSIS — R5383 Other fatigue: Secondary | ICD-10-CM | POA: Diagnosis not present

## 2022-07-02 DIAGNOSIS — I10 Essential (primary) hypertension: Secondary | ICD-10-CM | POA: Diagnosis not present

## 2022-07-05 ENCOUNTER — Inpatient Hospital Stay: Payer: Medicare Other

## 2022-07-05 DIAGNOSIS — D509 Iron deficiency anemia, unspecified: Secondary | ICD-10-CM | POA: Diagnosis not present

## 2022-07-05 LAB — CBC WITH DIFFERENTIAL/PLATELET
Abs Immature Granulocytes: 0 10*3/uL (ref 0.00–0.07)
Basophils Absolute: 0 10*3/uL (ref 0.0–0.1)
Basophils Relative: 1 %
Eosinophils Absolute: 0 10*3/uL (ref 0.0–0.5)
Eosinophils Relative: 3 %
HCT: 31.1 % — ABNORMAL LOW (ref 36.0–46.0)
Hemoglobin: 9.6 g/dL — ABNORMAL LOW (ref 12.0–15.0)
Immature Granulocytes: 0 %
Lymphocytes Relative: 36 %
Lymphs Abs: 0.6 10*3/uL — ABNORMAL LOW (ref 0.7–4.0)
MCH: 33.4 pg (ref 26.0–34.0)
MCHC: 30.9 g/dL (ref 30.0–36.0)
MCV: 108.4 fL — ABNORMAL HIGH (ref 80.0–100.0)
Monocytes Absolute: 0.1 10*3/uL (ref 0.1–1.0)
Monocytes Relative: 9 %
Neutro Abs: 0.8 10*3/uL — ABNORMAL LOW (ref 1.7–7.7)
Neutrophils Relative %: 51 %
Platelets: 48 10*3/uL — ABNORMAL LOW (ref 150–400)
RBC: 2.87 MIL/uL — ABNORMAL LOW (ref 3.87–5.11)
RDW: 16.9 % — ABNORMAL HIGH (ref 11.5–15.5)
WBC: 1.5 10*3/uL — ABNORMAL LOW (ref 4.0–10.5)
nRBC: 0 % (ref 0.0–0.2)

## 2022-07-05 LAB — SAMPLE TO BLOOD BANK

## 2022-07-16 ENCOUNTER — Other Ambulatory Visit: Payer: Self-pay

## 2022-07-16 DIAGNOSIS — D500 Iron deficiency anemia secondary to blood loss (chronic): Secondary | ICD-10-CM

## 2022-07-19 ENCOUNTER — Inpatient Hospital Stay: Payer: Medicare Other | Attending: Hematology

## 2022-07-19 DIAGNOSIS — K7460 Unspecified cirrhosis of liver: Secondary | ICD-10-CM | POA: Diagnosis not present

## 2022-07-19 DIAGNOSIS — K921 Melena: Secondary | ICD-10-CM | POA: Diagnosis not present

## 2022-07-19 DIAGNOSIS — Z79899 Other long term (current) drug therapy: Secondary | ICD-10-CM | POA: Diagnosis not present

## 2022-07-19 DIAGNOSIS — D631 Anemia in chronic kidney disease: Secondary | ICD-10-CM | POA: Diagnosis not present

## 2022-07-19 DIAGNOSIS — I129 Hypertensive chronic kidney disease with stage 1 through stage 4 chronic kidney disease, or unspecified chronic kidney disease: Secondary | ICD-10-CM | POA: Insufficient documentation

## 2022-07-19 DIAGNOSIS — Z87891 Personal history of nicotine dependence: Secondary | ICD-10-CM | POA: Insufficient documentation

## 2022-07-19 DIAGNOSIS — D500 Iron deficiency anemia secondary to blood loss (chronic): Secondary | ICD-10-CM

## 2022-07-19 DIAGNOSIS — N1831 Chronic kidney disease, stage 3a: Secondary | ICD-10-CM | POA: Diagnosis not present

## 2022-07-19 DIAGNOSIS — R1012 Left upper quadrant pain: Secondary | ICD-10-CM | POA: Insufficient documentation

## 2022-07-19 LAB — CBC WITH DIFFERENTIAL/PLATELET
Abs Immature Granulocytes: 0.01 10*3/uL (ref 0.00–0.07)
Basophils Absolute: 0 10*3/uL (ref 0.0–0.1)
Basophils Relative: 1 %
Eosinophils Absolute: 0.1 10*3/uL (ref 0.0–0.5)
Eosinophils Relative: 4 %
HCT: 31.1 % — ABNORMAL LOW (ref 36.0–46.0)
Hemoglobin: 9.6 g/dL — ABNORMAL LOW (ref 12.0–15.0)
Immature Granulocytes: 1 %
Lymphocytes Relative: 31 %
Lymphs Abs: 0.6 10*3/uL — ABNORMAL LOW (ref 0.7–4.0)
MCH: 32.5 pg (ref 26.0–34.0)
MCHC: 30.9 g/dL (ref 30.0–36.0)
MCV: 105.4 fL — ABNORMAL HIGH (ref 80.0–100.0)
Monocytes Absolute: 0.1 10*3/uL (ref 0.1–1.0)
Monocytes Relative: 6 %
Neutro Abs: 1.2 10*3/uL — ABNORMAL LOW (ref 1.7–7.7)
Neutrophils Relative %: 57 %
Platelets: 42 10*3/uL — ABNORMAL LOW (ref 150–400)
RBC: 2.95 MIL/uL — ABNORMAL LOW (ref 3.87–5.11)
RDW: 15.5 % (ref 11.5–15.5)
WBC: 2 10*3/uL — ABNORMAL LOW (ref 4.0–10.5)
nRBC: 0 % (ref 0.0–0.2)

## 2022-07-19 LAB — SAMPLE TO BLOOD BANK

## 2022-07-22 ENCOUNTER — Other Ambulatory Visit (INDEPENDENT_AMBULATORY_CARE_PROVIDER_SITE_OTHER): Payer: Self-pay | Admitting: Gastroenterology

## 2022-07-22 DIAGNOSIS — K7460 Unspecified cirrhosis of liver: Secondary | ICD-10-CM

## 2022-07-22 DIAGNOSIS — I8510 Secondary esophageal varices without bleeding: Secondary | ICD-10-CM

## 2022-07-22 NOTE — Telephone Encounter (Signed)
I spoke with the patient and she is aware we would prefer Dr. Sherryll Burger to handle this as he is the one whom increased this to 6.25 bid. Patient states understanding and will have Dr. Sherryll Burger to refill when time comes to refill.

## 2022-07-22 NOTE — Telephone Encounter (Signed)
I called and left a message asked that the patient please return call.  

## 2022-07-22 NOTE — Telephone Encounter (Signed)
Per patient daughter Burna Mortimer patient is on this, but it was recently increased by pcp Dr. Sherryll Burger to 6.25 bid. Please advise if we need to send the request to Dr. Sherryll Burger. This was started by [place] due to [facility] and secondary esophageal varices with out bleeding. Please advise.

## 2022-08-02 ENCOUNTER — Other Ambulatory Visit: Payer: Self-pay | Admitting: Physician Assistant

## 2022-08-02 DIAGNOSIS — K7460 Unspecified cirrhosis of liver: Secondary | ICD-10-CM | POA: Diagnosis not present

## 2022-08-02 DIAGNOSIS — R233 Spontaneous ecchymoses: Secondary | ICD-10-CM

## 2022-08-02 DIAGNOSIS — R188 Other ascites: Secondary | ICD-10-CM | POA: Diagnosis not present

## 2022-08-02 DIAGNOSIS — Z299 Encounter for prophylactic measures, unspecified: Secondary | ICD-10-CM | POA: Diagnosis not present

## 2022-08-02 DIAGNOSIS — D61818 Other pancytopenia: Secondary | ICD-10-CM | POA: Diagnosis not present

## 2022-08-02 DIAGNOSIS — R109 Unspecified abdominal pain: Secondary | ICD-10-CM | POA: Diagnosis not present

## 2022-08-02 DIAGNOSIS — E1165 Type 2 diabetes mellitus with hyperglycemia: Secondary | ICD-10-CM | POA: Diagnosis not present

## 2022-08-02 DIAGNOSIS — I10 Essential (primary) hypertension: Secondary | ICD-10-CM | POA: Diagnosis not present

## 2022-08-02 DIAGNOSIS — R161 Splenomegaly, not elsewhere classified: Secondary | ICD-10-CM | POA: Diagnosis not present

## 2022-08-02 NOTE — Progress Notes (Signed)
Patient contacted clinic due to concerns regarding bruising.  She is scheduled for labs later this week, but we will try to move this up.  We will also add PT/INR, APTT, fibrinogen, and von Willebrand factor.

## 2022-08-04 ENCOUNTER — Inpatient Hospital Stay: Payer: Medicare Other

## 2022-08-04 DIAGNOSIS — Z87891 Personal history of nicotine dependence: Secondary | ICD-10-CM | POA: Diagnosis not present

## 2022-08-04 DIAGNOSIS — I129 Hypertensive chronic kidney disease with stage 1 through stage 4 chronic kidney disease, or unspecified chronic kidney disease: Secondary | ICD-10-CM | POA: Diagnosis not present

## 2022-08-04 DIAGNOSIS — N1831 Chronic kidney disease, stage 3a: Secondary | ICD-10-CM | POA: Diagnosis not present

## 2022-08-04 DIAGNOSIS — K7460 Unspecified cirrhosis of liver: Secondary | ICD-10-CM | POA: Diagnosis not present

## 2022-08-04 DIAGNOSIS — D509 Iron deficiency anemia, unspecified: Secondary | ICD-10-CM

## 2022-08-04 DIAGNOSIS — R1012 Left upper quadrant pain: Secondary | ICD-10-CM | POA: Diagnosis not present

## 2022-08-04 DIAGNOSIS — D631 Anemia in chronic kidney disease: Secondary | ICD-10-CM | POA: Diagnosis not present

## 2022-08-04 DIAGNOSIS — R233 Spontaneous ecchymoses: Secondary | ICD-10-CM

## 2022-08-04 LAB — CBC WITH DIFFERENTIAL/PLATELET
Abs Immature Granulocytes: 0.01 10*3/uL (ref 0.00–0.07)
Basophils Absolute: 0 10*3/uL (ref 0.0–0.1)
Basophils Relative: 1 %
Eosinophils Absolute: 0.1 10*3/uL (ref 0.0–0.5)
Eosinophils Relative: 3 %
HCT: 28.4 % — ABNORMAL LOW (ref 36.0–46.0)
Hemoglobin: 8.9 g/dL — ABNORMAL LOW (ref 12.0–15.0)
Immature Granulocytes: 1 %
Lymphocytes Relative: 35 %
Lymphs Abs: 0.6 10*3/uL — ABNORMAL LOW (ref 0.7–4.0)
MCH: 33 pg (ref 26.0–34.0)
MCHC: 31.3 g/dL (ref 30.0–36.0)
MCV: 105.2 fL — ABNORMAL HIGH (ref 80.0–100.0)
Monocytes Absolute: 0.1 10*3/uL (ref 0.1–1.0)
Monocytes Relative: 5 %
Neutro Abs: 1 10*3/uL — ABNORMAL LOW (ref 1.7–7.7)
Neutrophils Relative %: 55 %
Platelets: 53 10*3/uL — ABNORMAL LOW (ref 150–400)
RBC: 2.7 MIL/uL — ABNORMAL LOW (ref 3.87–5.11)
RDW: 15.9 % — ABNORMAL HIGH (ref 11.5–15.5)
WBC: 1.8 10*3/uL — ABNORMAL LOW (ref 4.0–10.5)
nRBC: 0 % (ref 0.0–0.2)

## 2022-08-04 LAB — IRON AND TIBC
Iron: 79 ug/dL (ref 28–170)
Saturation Ratios: 21 % (ref 10.4–31.8)
TIBC: 381 ug/dL (ref 250–450)
UIBC: 302 ug/dL

## 2022-08-04 LAB — FERRITIN: Ferritin: 139 ng/mL (ref 11–307)

## 2022-08-04 LAB — APTT: aPTT: 34 seconds (ref 24–36)

## 2022-08-04 LAB — PROTIME-INR
INR: 1.2 (ref 0.8–1.2)
Prothrombin Time: 15.9 seconds — ABNORMAL HIGH (ref 11.4–15.2)

## 2022-08-04 LAB — FIBRINOGEN: Fibrinogen: 310 mg/dL (ref 210–475)

## 2022-08-05 LAB — VON WILLEBRAND PANEL
Coagulation Factor VIII: 208 % — ABNORMAL HIGH (ref 56–140)
Ristocetin Co-factor, Plasma: 381 % — ABNORMAL HIGH (ref 50–200)
Von Willebrand Antigen, Plasma: 553 % — ABNORMAL HIGH (ref 50–200)

## 2022-08-05 LAB — COAG STUDIES INTERP REPORT

## 2022-08-10 DIAGNOSIS — Z299 Encounter for prophylactic measures, unspecified: Secondary | ICD-10-CM | POA: Diagnosis not present

## 2022-08-10 DIAGNOSIS — I10 Essential (primary) hypertension: Secondary | ICD-10-CM | POA: Diagnosis not present

## 2022-08-10 DIAGNOSIS — E1165 Type 2 diabetes mellitus with hyperglycemia: Secondary | ICD-10-CM | POA: Diagnosis not present

## 2022-08-10 NOTE — Progress Notes (Unsigned)
[facility] [address], [place] [address]   CLINIC:  Medical Oncology/Hematology  PCP:  Kirstie Peri, MD [address] [place] [address] [phone]   REASON FOR VISIT:  Follow-up for pancytopenia  PRIOR THERAPY: Blood transfusions  CURRENT THERAPY: Intermittent IV iron  INTERVAL HISTORY:   Jeanette Chang 82 y.o. 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  She was last seen by Rojelio Brenner PA-C on 06/07/2022.  At today's visit, she reports feeling somewhat poorly.  Since her last visit, her son passed away secondary to cancer.  This happened just 8 months after her husband had passed away in 09-18-2021.  Since last visit, her PCP had noticed some increased bruising.  She has had significant bruising of her extremities, but also started to have some abdominal bruising.  Abdominal bruising occurred at the locations where she was receiving insulin injection, as well as the area below that where the "bruising seemed to pool together."  She also had some bruising and pain in her LUQ abdomen.  Thankfully, CT abdomen/pelvis ordered by PCP was negative for any acute abnormality.  She denies any petechiae.  She has intermittent hematochezia thought to be related to bleeding hemorrhoids.  She had an episode that lasted about 3 days in late May/early June, but that improved after she started putting hemorrhoid cream on the area.  She denies any melena, coffee-ground emesis, or epistaxis.  She felt no improvement after her most recent Feraheme x 3 from 06/07/2022 through 06/23/2022.  She continues to have significant fatigue, pica, restless legs, headaches, dyspnea on exertion, and lightheadedness with standing.  No chest pain or syncopal episodes.   She has not had any recent infections, B symptoms, or lymphadenopathy.  She has little to no energy and 80% appetite. She endorses that she is maintaining a stable weight.  ASSESSMENT &  PLAN:  1.   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 - EGD (06/17/2020): Grade 2 esophageal var   ices, tumor at the pylorus, biopsy benign.  Normal duodenum. - Colonoscopy (06/29/2019): Perianal skin tags found on perianal exam.  2 small polyps at the splenic flexure.  External hemorrhoids. - BMBX (09/09/2020): Normocellular marrow (20%) with TLH, including minimal dyserythropoiesis and 1% blasts by manual aspirate differential.  Mild dyserythropoiesis which does not fulfill criteria for MDS.  Karyotype 45, X, -X[4]/46, XX[16], loss of 1 X chromosome is rare finding in the bone marrow is of older woman and when present as a minor clone (less than 50% of 20 metaphases) is thought to be most likely aging effect. - Labs from December 2023 showed normal B12 of, folate, copper, SPEP.  CKD stage IIIa/b. - History of PRBC transfusions x 2 in October 2023 - She received Feraheme x 3 from 06/07/2022 through 06/23/2022 - Most recent labs (08/04/2022): Hgb 8.9/MCV 105.2, ferritin 139, iron saturation 21% -Intermittent hematochezia thought to be from bleeding hemorrhoids.  No melena. - PLAN: She has persistent severe anemia despite adequate iron levels.  Recommend starting Retacrit 10,000 units every 2 weeks in the setting of anemia of CKD as well as dyserythropoiesis noted on BMBX from 09-18-2020.   - CBC/differential + BB sample every 2 weeks -- We will check cystatin C with next lab draw - she has evidence of elevated creatinine on her labs, but suspect that she may have some more significant CKD that is masked by her  liver cirrhosis - Recheck CBC/D, CMP, and iron panel with RTC in 4 weeks  - If any severe derangements beyond baseline cytopenias, would consider repeat bone marrow biopsy  2.  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   Normal duodenum. - Colonoscopy (06/29/2019): Perianal skin tags found on perianal exam.  2 small polyps at the splenic flexure.  External hemorrhoids. - BMBX (09/09/2020): Normocellular marrow (20%) with TLH, including minimal dyserythropoiesis and 1% blasts by manual aspirate differential.  Mild dyserythropoiesis which does not fulfill criteria for MDS.  Karyotype 45, X, -X[4]/46, XX[16], loss of 1 X chromosome is rare finding in the bone marrow is of older woman and when present as a minor clone (less than 50% of 20 metaphases) is thought to be most likely aging effect. - CT AP (04/24/2021): Spleen enlarged measuring 14.1 cm with volume 632 mL.  Liver has nodular contour consistent with cirrhosis. - Labs from December 2023 showed normal B12 of, folate, copper, SPEP.  CKD stage IIIa/b. - No B symptoms or recent infections. - Most recent labs (08/04/2022): Platelets 53, WBC 1.8/ANC 1.0/lymphocyte 0.6 - PLAN: We will continue active surveillance with repeat CBC/differential in 2 months  - If any severe derangements beyond baseline cytopenias, would consider repeat bone marrow biopsy  3.  Easy bruising - Per patient report, PCP had concerns regarding her easy bruising - Additional labs were checked on 08/04/2022, which showed normal fibrinogen, normal APTT, and normal INR.  Von Willebrand panel was not consistent with diagnosis of von Willebrand's disease. 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  4.  Social/family history: - Other PMH includes NASH cirrhosis, diabetes, arthritis, carotid artery disease her daughter lives with her at home.  She is independent of ADLs and IADLs.  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  Quit smoking in November 1992. - Maternal grandmother had low platelets.  Sister had low platelet. - Mother had colon cancer.  Sister had stomach cancer.  Paternal uncle had lung cancer.   PLAN SUMMARY: >> CBC/BB sample +  Retacrit every 2 weeks (first dose today) >> Check Cystatin C (Misc [facility] send out) with lab draw in 2 weeks >> Same-day labs (CBC/D, BB sample, ferritin, iron/TIBC, CMP) + injection + OFFICE visit in 1 month      REVIEW OF SYSTEMS:  Review of Systems  Constitutional:  Positive for fatigue. Negative for appetite change, chills, diaphoresis, fever and unexpected weight change.  HENT:   Negative for lump/mass and nosebleeds.   Eyes:  Negative for eye problems.  Respiratory:  Positive for shortness of breath. Negative for cough and hemoptysis.   Cardiovascular:  Positive for leg swelling. Negative for chest pain and palpitations.  Gastrointestinal:  Positive for abdominal pain and blood in stool. Negative for constipation, diarrhea, nausea and vomiting.  Genitourinary:  Negative for hematuria.   Musculoskeletal:  Positive for arthralgias.  Skin: Negative.   Neurological:  Positive for dizziness and headaches. Negative for light-headedness.  Hematological:  Bruises/bleeds easily.  Psychiatric/Behavioral:  Positive for depression and sleep disturbance. The patient is nervous/anxious.      PHYSICAL EXAM:  ECOG PERFORMANCE STATUS: 1 - Symptomatic but completely ambulatory  There were no vitals filed for this visit. There were no vitals filed for this visit. Physical Exam Constitutional:      Appearance: Normal appearance. She is normal weight.  Comments: Weak-appearing  Cardiovascular:     Heart sounds: Normal heart sounds.  Pulmonary:     Breath sounds: Normal breath sounds.  Skin:    Findings: Bruising present.  Neurological:     General: No focal deficit present.     Mental Status: Mental status is at baseline.  Psychiatric:        Behavior: Behavior normal. Behavior is cooperative.     PAST MEDICAL/SURGICAL HISTORY:  Past Medical History:  Diagnosis Date   Abnormal LFTs    Achilles bursitis or tendinitis    Acute maxillary sinusitis    Acute pharyngitis     Anemia, unspecified    Candidiasis of skin and nails    Dermatophytosis of foot    Dermatophytosis of the body    Diverticulitis of colon (without mention of hemorrhage)(562.11)    Edema    Headache(784.0)    HOH (hard of hearing)    Malaise and fatigue    Occlusion and stenosis of carotid artery without mention of cerebral infarction    Osteoarthrosis, unspecified whether generalized or localized, unspecified site    Other dyspnea and respiratory abnormality    Other psoriasis    Other specified disorders of rotator cuff syndrome of shoulder and allied disorders    Pain in joint    Pure hypercholesterolemia    RUQ pain    Spasm of back muscles    Thrombocytopenia, unspecified (HCC)    Type II or unspecified type diabetes mellitus without mention of complication, not stated as uncontrolled    Unspecified essential hypertension    Unspecified sinusitis (chronic)    Urinary incontinence    Past Surgical History:  Procedure Laterality Date   APPENDECTOMY  1964   BIOPSY  03/02/2017   Procedure: BIOPSY;  Surgeon: Malissa Hippo, MD;  [facility];  Service: Endoscopy;;  antral   BIOPSY  06/17/2020   Procedure: BIOPSY;  Surgeon: Dolores Frame, MD;  [facility];  Service: Gastroenterology;;   CAROTID ENDARTERECTOMY  2005   Left CEA   CAROTID ENDARTERECTOMY  2009   Right CEA   CATARACT EXTRACTION, BILATERAL Bilateral    CHOLECYSTECTOMY  1982   Gall Bladder   COLONOSCOPY  09   COLONOSCOPY N/A 06/27/2019   Procedure: COLONOSCOPY;  Surgeon: Malissa Hippo, MD;  [facility];  Service: Endoscopy;  Laterality: N/A;   ESOPHAGOGASTRODUODENOSCOPY N/A 03/02/2017   Procedure: ESOPHAGOGASTRODUODENOSCOPY (EGD);  Surgeon: Malissa Hippo, MD;  [facility];  Service: Endoscopy;  Laterality: N/A;  12:55   ESOPHAGOGASTRODUODENOSCOPY N/A 06/27/2019   Procedure: ESOPHAGOGASTRODUODENOSCOPY (EGD);  Surgeon: Malissa Hippo, MD;  [facility];  Service: Endoscopy;  Laterality: N/A;  730   ESOPHAGOGASTRODUODENOSCOPY (EGD) WITH PROPOFOL N/A 06/17/2020   Procedure: ESOPHAGOGASTRODUODENOSCOPY (EGD) WITH PROPOFOL;  Surgeon: Dolores Frame, MD;  [facility];  Service: Gastroenterology;  Laterality: N/A;  AM   GIVENS CAPSULE STUDY N/A 10/24/2019   Procedure: GIVENS CAPSULE STUDY;  Surgeon: Malissa Hippo, MD;  [facility];  Service: Endoscopy;  Laterality: N/A;  730   KNEE ARTHROSCOPY WITH MEDIAL MENISECTOMY Left 07/10/2019   Procedure: KNEE ARTHROSCOPY WITH MEDIAL MENISCECTOMY;  Surgeon: Vickki Hearing, MD;  [facility]: [facility];  Service: Orthopedics;  Laterality: Left;   KNEE ARTHROSCOPY WITH MEDIAL MENISECTOMY Left 03/14/2020   Procedure: KNEE ARTHROSCOPY WITH MEDIAL MENISCECTOMY;  Surgeon: Vickki Hearing, MD;  [facility];  Service: Orthopedics;  Laterality:  Left;   NOSE SURGERY  1978   PARATHYROIDECTOMY  1992   POLYPECTOMY  03/02/2017   Procedure: POLYPECTOMY;  Surgeon: Malissa Hippo, MD;  [facility];  Service: Endoscopy;;  gastric    POLYPECTOMY  06/27/2019   Procedure: POLYPECTOMY;  Surgeon: Malissa Hippo, MD;  [facility];  Service: Endoscopy;;  colon   Power port  03/06/2009   Right upper chest    TONSILLECTOMY     TUMOR EXCISION Right August 24, 2013   [facility] Dr. Haynes Hoehn, ENT   VAGINAL HYSTERECTOMY  [id number]    SOCIAL HISTORY:  Social History   Socioeconomic History   Marital status: Widowed    Spouse name: Not on file   Number of children: Not on file   Years of education: Not on file   Highest education level: Not on file  Occupational History   Not on file  Tobacco Use   Smoking status: Former    Packs/day: 1.00    Years: 42.00    Additional pack years: 0.00    Total pack years: 42.00    Types: Cigarettes    Quit date: 03/12/1992    Years since quitting: 30.4    Passive exposure: Past   Smokeless  tobacco: Former    Quit date: 12/16/1992  Vaping Use   Vaping Use: Never used  Substance and Sexual Activity   Alcohol use: Yes    Alcohol/week: 1.0 standard drink of alcohol    Types: 1 Glasses of wine per week    Comment: very seldom   Drug use: No   Sexual activity: Not Currently  Other Topics Concern   Not on file  Social History Narrative   Not on file   Social Determinants of Health   Financial Resource Strain: Not on file  Food Insecurity: Not on file  Transportation Needs: Not on file  Physical Activity: Not on file  Stress: Not on file  Social Connections: Not on file  Intimate Partner Violence: Not on file    FAMILY HISTORY:  Family History  Problem Relation Age of Onset   Heart attack Mother    Cancer Mother    Heart attack Father    Cancer Sister     CURRENT MEDICATIONS:  Outpatient Encounter Medications as of 08/11/2022  Medication Sig Note   acetaminophen (TYLENOL) 500 MG tablet Take 1 tablet (500 mg total) by mouth every 6 (six) hours as needed for moderate pain or headache.    aspirin EC 81 MG tablet Take 81 mg by mouth every other day. AT NIGHT.    B-D UF III MINI PEN NEEDLES 31G X 5 MM MISC SMARTSIG:1 Pen Needle SUB-Q Daily    Biotin 10 MG CAPS Take by mouth daily at 6 (six) AM.    cholecalciferol (VITAMIN D) 25 MCG (1000 UNIT) tablet Take 1,000 Units by mouth daily.    dicyclomine (BENTYL) 10 MG capsule TAKE 1 CAPSULE (10 MG TOTAL) BY MOUTH 3 (THREE) TIMES DAILY BEFORE MEALS.    furosemide (LASIX) 20 MG tablet Take 20 mg by mouth as needed.    gabapentin (NEURONTIN) 300 MG capsule Take 300 mg by mouth at bedtime.    losartan (COZAAR) 25 MG tablet Take 25 mg by mouth daily.    metFORMIN (GLUCOPHAGE) 500 MG tablet Take 500 mg by mouth 2 (two) times daily with a meal.    OVER THE COUNTER MEDICATION Iron as needed. Not daily  OVER THE COUNTER MEDICATION Compounded Hemorrhoid cream [facility]) Apply rectally up to four times per day.  04/26/2022: prn   TRESIBA FLEXTOUCH 100 UNIT/ML FlexTouch Pen SMARTSIG:10 Unit(s) SUB-Q Daily    Facility-Administered Encounter Medications as of 08/11/2022  Medication   sodium chloride irrigation 0.9 %    ALLERGIES:  Allergies  Allergen Reactions   Codeine Nausea And Vomiting and Rash   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   Toviaz [Fesoterodine Fumarate] Other (See Comments)    Cramps, severe headache   Norco [Hydrocodone-Acetaminophen] Nausea And Vomiting and Anxiety   Penicillins Swelling, Rash and Other (See Comments)    Has patient had a PCN reaction causing immediate rash, facial/tongue/throat swelling, SOB or lightheadedness with hypotension: No Has patient had a PCN reaction causing severe rash involving mucus membranes or skin necrosis: No Has patient had a PCN reaction that required hospitalization: Yes Has patient had a PCN reaction occurring within the last 10 years: No If all of the above answers are "NO", then may proceed with Cephalosporin use.    Ultram [Tramadol Hcl] Nausea And Vomiting    LABORATORY DATA:  I have reviewed the labs as listed.  CBC    Component Value Date/Time   WBC 1.8 (L) 08/04/2022 1108   RBC 2.70 (L) 08/04/2022 1108   HGB 8.9 (L) 08/04/2022 1108   HCT 28.4 (L) 08/04/2022 1108   PLT 53 (L) 08/04/2022 1108   MCV 105.2 (H) 08/04/2022 1108   MCH 33.0 08/04/2022  1108   MCHC 31.3 08/04/2022 1108   RDW 15.9 (H) 08/04/2022 1108   LYMPHSABS 0.6 (L) 08/04/2022 1108   MONOABS 0.1 08/04/2022 1108   EOSABS 0.1 08/04/2022 1108   BASOSABS 0.0 08/04/2022 1108      Latest Ref Rng & Units 04/26/2022    1:27 PM 04/24/2021    3:44 PM 10/10/2020    9:29 AM  CMP  Glucose 65 - 99 mg/dL 644   75   BUN 7 - 25 mg/dL 24   32   Creatinine 0.34 - 0.95 mg/dL 7.42  5.95  6.38   Sodium 135 - 146 mmol/L 144   144   Potassium 3.5 - 5.3 mmol/L 4.2   4.2   Chloride 98 - 110 mmol/L 107   106   CO2 20 - 32 mmol/L 26   27   Calcium 8.6 - 10.4 mg/dL 9.5   9.2   Total Protein 6.1 - 8.1 g/dL 6.6   6.8   Total Bilirubin 0.2 - 1.2 mg/dL 0.5   0.4   AST 10 - 35 U/L 36   24   ALT 6 - 29 U/L 30   18     DIAGNOSTIC IMAGING:  I have independently reviewed the relevant imaging and discussed with the patient.   WRAP UP:  All questions  were answered. The patient knows to call the clinic with any problems, questions or concerns.  Medical decision making: Moderate  Time spent on visit: I spent 20 minutes counseling the patient face to face. The total time spent in the appointment was 30 minutes and more than 50% was on counseling.  Carnella Guadalajara, PA-C  08/11/22 4:59 PM

## 2022-08-11 ENCOUNTER — Encounter: Payer: Self-pay | Admitting: Hematology

## 2022-08-11 ENCOUNTER — Inpatient Hospital Stay: Payer: Medicare Other

## 2022-08-11 ENCOUNTER — Inpatient Hospital Stay (HOSPITAL_BASED_OUTPATIENT_CLINIC_OR_DEPARTMENT_OTHER): Payer: Medicare Other | Admitting: Physician Assistant

## 2022-08-11 VITALS — BP 122/51 | HR 78 | Temp 98.2°F | Resp 18 | Ht 63.0 in | Wt 136.8 lb

## 2022-08-11 DIAGNOSIS — R1012 Left upper quadrant pain: Secondary | ICD-10-CM | POA: Diagnosis not present

## 2022-08-11 DIAGNOSIS — D696 Thrombocytopenia, unspecified: Secondary | ICD-10-CM

## 2022-08-11 DIAGNOSIS — D631 Anemia in chronic kidney disease: Secondary | ICD-10-CM | POA: Diagnosis not present

## 2022-08-11 DIAGNOSIS — D509 Iron deficiency anemia, unspecified: Secondary | ICD-10-CM

## 2022-08-11 DIAGNOSIS — K7460 Unspecified cirrhosis of liver: Secondary | ICD-10-CM | POA: Diagnosis not present

## 2022-08-11 DIAGNOSIS — D500 Iron deficiency anemia secondary to blood loss (chronic): Secondary | ICD-10-CM

## 2022-08-11 DIAGNOSIS — I129 Hypertensive chronic kidney disease with stage 1 through stage 4 chronic kidney disease, or unspecified chronic kidney disease: Secondary | ICD-10-CM | POA: Diagnosis not present

## 2022-08-11 DIAGNOSIS — N1831 Chronic kidney disease, stage 3a: Secondary | ICD-10-CM

## 2022-08-11 DIAGNOSIS — Z87891 Personal history of nicotine dependence: Secondary | ICD-10-CM | POA: Diagnosis not present

## 2022-08-11 MED ORDER — EPOETIN ALFA-EPBX 10000 UNIT/ML IJ SOLN
10000.0000 [IU] | Freq: Once | INTRAMUSCULAR | Status: AC
  Administered 2022-08-11: 10000 [IU] via SUBCUTANEOUS
  Filled 2022-08-11: qty 1

## 2022-08-11 NOTE — Patient Instructions (Signed)
[facility] at [facility] **VISIT SUMMARY & IMPORTANT INSTRUCTIONS **   You were seen today by Rojelio Brenner PA-C for your follow-up visit.    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 This is likely related to your ongoing blood loss (hemorrhoid bleeding). It is also possible that your body is not making enough new blood cells due to your chronic kidney disease and the mild abnormalities which were seen on your previous bone marrow biopsy. I recommend starting RETACRIT (epoetin alfa injection) to help your body make more blood cells. We will continue to check your blood counts every 2 weeks on the same day that you receive the Retacrit injection. I will check your blood counts and iron levels again in 1 month to see if you need any additional IV iron and to make sure you are doing well after starting Retacrit injections.  LOW PLATELETS & WHITE BLOOD CELLS Your low platelets and low white blood cells remain low, but are overall stable. 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 The other labs that we checked did not show any other reason for you to have any severe bruising.  Your bruising is most likely related to your low platelets.  FOLLOW-UP APPOINTMENT: Office visit in 1 month  ** Thank you for trusting me with your healthcare!  I strive to provide all of my patients with quality care at each visit.  If you receive a survey for this visit, I would be so grateful to you for taking the time to provide feedback.  Thank you in advance!  ~ Thayden Lemire                   Dr. Doreatha Massed   &   Rojelio Brenner, PA-C   - - - - - - - - - - - - - - - - - -    Thank you for choosing [facility] at [facility] to provide your  oncology and hematology care.  To afford each patient quality time with our provider, please arrive at least 15 minutes before your scheduled appointment time.   If you have a lab appointment with the Cancer Center please come in thru the Main Entrance and check in at the main information desk.  You need to re-schedule your appointment should you arrive 10 or more minutes late.  We strive to give you quality time with our providers, and arriving late affects you and other patients whose appointments are after yours.  Also, if you no show three or more times for appointments you may be dismissed from the clinic at the providers discretion.     Again, thank you for choosing [facility].  Our hope is that these requests will decrease the amount of time that you wait before being seen by our physicians.       _____________________________________________________________  Should you have questions after your visit to [facility], please contact our office at [phone] and follow the prompts.  Our office hours are 8:00 a.m. and 4:30 p.m. Monday - Friday.  Please note that voicemails left after 4:00 p.m. may not be returned until the following business day.  We are closed weekends and major holidays.  You do have access to a nurse 24-7, just call the main number to the clinic [phone] and do not press any options, hold on the line and a nurse will answer the phone.    For prescription refill requests, have your pharmacy contact our office and allow 72 hours.

## 2022-08-11 NOTE — Progress Notes (Signed)
Retacrit 10,000 units given in left deltoid, per order without incident.  Site CDI.  Tolerated well.  Discharged in stable condition ambulatory with daughter.

## 2022-08-25 ENCOUNTER — Inpatient Hospital Stay: Payer: Medicare Other | Attending: Hematology

## 2022-08-25 ENCOUNTER — Inpatient Hospital Stay: Payer: Medicare Other

## 2022-08-25 VITALS — BP 157/49 | HR 58 | Temp 97.7°F | Resp 17

## 2022-08-25 DIAGNOSIS — N1831 Chronic kidney disease, stage 3a: Secondary | ICD-10-CM | POA: Insufficient documentation

## 2022-08-25 DIAGNOSIS — D509 Iron deficiency anemia, unspecified: Secondary | ICD-10-CM

## 2022-08-25 DIAGNOSIS — Z87891 Personal history of nicotine dependence: Secondary | ICD-10-CM | POA: Insufficient documentation

## 2022-08-25 DIAGNOSIS — D631 Anemia in chronic kidney disease: Secondary | ICD-10-CM | POA: Insufficient documentation

## 2022-08-25 DIAGNOSIS — D500 Iron deficiency anemia secondary to blood loss (chronic): Secondary | ICD-10-CM

## 2022-08-25 DIAGNOSIS — D696 Thrombocytopenia, unspecified: Secondary | ICD-10-CM

## 2022-08-25 LAB — CBC
HCT: 31.7 % — ABNORMAL LOW (ref 36.0–46.0)
Hemoglobin: 9.8 g/dL — ABNORMAL LOW (ref 12.0–15.0)
MCH: 32.5 pg (ref 26.0–34.0)
MCHC: 30.9 g/dL (ref 30.0–36.0)
MCV: 105 fL — ABNORMAL HIGH (ref 80.0–100.0)
Platelets: 50 10*3/uL — ABNORMAL LOW (ref 150–400)
RBC: 3.02 MIL/uL — ABNORMAL LOW (ref 3.87–5.11)
RDW: 15 % (ref 11.5–15.5)
WBC: 2.8 10*3/uL — ABNORMAL LOW (ref 4.0–10.5)
nRBC: 0 % (ref 0.0–0.2)

## 2022-08-25 LAB — SAMPLE TO BLOOD BANK

## 2022-08-25 MED ORDER — EPOETIN ALFA-EPBX 10000 UNIT/ML IJ SOLN
10000.0000 [IU] | Freq: Once | INTRAMUSCULAR | Status: AC
  Administered 2022-08-25: 10000 [IU] via SUBCUTANEOUS
  Filled 2022-08-25: qty 1

## 2022-08-25 NOTE — Progress Notes (Signed)
Jeanette Chang presents today for Retacrit injection per the provider's orders.  Stable during administration without incident; injection site WNL; see MAR for injection details.  Patient tolerated procedure well and without incident.  No questions or complaints noted at this time.

## 2022-08-25 NOTE — Patient Instructions (Signed)
[facility]  Discharge Instructions: Thank you for choosing [facility] to provide your oncology and hematology care.  If you have a lab appointment with the Cancer Center - please note that after April 8th, 2024, all labs will be drawn in the cancer center.  You do not have to check in or register with the main entrance as you have in the past but will complete your check-in in the cancer center.  Wear comfortable clothing and clothing appropriate for easy access to any Portacath or PICC line.   We strive to give you quality time with your provider. You may need to reschedule your appointment if you arrive late (15 or more minutes).  Arriving late affects you and other patients whose appointments are after yours.  Also, if you miss three or more appointments without notifying the office, you may be dismissed from the clinic at the provider's discretion.      For prescription refill requests, have your pharmacy contact our office and allow 72 hours for refills to be completed.    Today you received the following chemotherapy and/or immunotherapy agents retacrit      To help prevent nausea and vomiting after your treatment, we encourage you to take your nausea medication as directed.  BELOW ARE SYMPTOMS THAT SHOULD BE REPORTED IMMEDIATELY: *FEVER GREATER THAN 100.4 F (38 C) OR HIGHER *CHILLS OR SWEATING *NAUSEA AND VOMITING THAT IS NOT CONTROLLED WITH YOUR NAUSEA MEDICATION *UNUSUAL SHORTNESS OF BREATH *UNUSUAL BRUISING OR BLEEDING *URINARY PROBLEMS (pain or burning when urinating, or frequent urination) *BOWEL PROBLEMS (unusual diarrhea, constipation, pain near the anus) TENDERNESS IN MOUTH AND THROAT WITH OR WITHOUT PRESENCE OF ULCERS (sore throat, sores in mouth, or a toothache) UNUSUAL RASH, SWELLING OR PAIN  UNUSUAL VAGINAL DISCHARGE OR ITCHING   Items with * indicate a potential emergency and should be followed up as soon as possible or go to the  Emergency Department if any problems should occur.  Please show the CHEMOTHERAPY ALERT CARD or IMMUNOTHERAPY ALERT CARD at check-in to the Emergency Department and triage nurse.  Should you have questions after your visit or need to cancel or reschedule your appointment, please contact [facility] [phone]  and follow the prompts.  Office hours are 8:00 a.m. to 4:30 p.m. Monday - Friday. Please note that voicemails left after 4:00 p.m. may not be returned until the following business day.  We are closed weekends and major holidays. You have access to a nurse at all times for urgent questions. Please call the main number to the clinic [phone] and follow the prompts.  For any non-urgent questions, you may also contact your provider using MyChart. We now offer e-Visits for anyone 68 and older to request care online for non-urgent symptoms. For details visit [email or web address].   Also download the MyChart app! Go to the app store, search "MyChart", open the app, select [facility], and log in with your MyChart username and password.

## 2022-08-27 LAB — MISC LABCORP TEST (SEND OUT): Labcorp test code: 121251

## 2022-08-31 DIAGNOSIS — E1165 Type 2 diabetes mellitus with hyperglycemia: Secondary | ICD-10-CM | POA: Diagnosis not present

## 2022-08-31 DIAGNOSIS — Z299 Encounter for prophylactic measures, unspecified: Secondary | ICD-10-CM | POA: Diagnosis not present

## 2022-08-31 DIAGNOSIS — D509 Iron deficiency anemia, unspecified: Secondary | ICD-10-CM | POA: Diagnosis not present

## 2022-08-31 DIAGNOSIS — E1142 Type 2 diabetes mellitus with diabetic polyneuropathy: Secondary | ICD-10-CM | POA: Diagnosis not present

## 2022-08-31 DIAGNOSIS — K7460 Unspecified cirrhosis of liver: Secondary | ICD-10-CM | POA: Diagnosis not present

## 2022-08-31 DIAGNOSIS — I10 Essential (primary) hypertension: Secondary | ICD-10-CM | POA: Diagnosis not present

## 2022-09-07 NOTE — Progress Notes (Unsigned)
[facility] [address], [place] [address]   CLINIC:  Medical Oncology/Hematology  PCP:  Kirstie Peri, MD [address] [place] [address] [phone]   REASON FOR VISIT:  Follow-up for pancytopenia  PRIOR THERAPY: Blood transfusions  CURRENT THERAPY: Intermittent IV iron + Retacrit  INTERVAL HISTORY:   Jeanette Chang 82 y.o. 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  She was last seen by Rojelio Brenner PA-C on 08/11/2022.  At today's visit, she reports feeling fairly well.  She continues to grieve the loss of her husband and son, who both passed away within the past year.  She has not had any hospitalizations or changes in her baseline health since her last visit.  She was started on Retacrit injections at her last appointment.  She is tolerating this well.  She denies any symptoms concerning for DVT/PE, and her blood pressure today is 146/54.  Since her last visit, she has had recurrent ice pica and leg cramping.  She has ongoing fatigue with energy about 25%.  She has shortness of breath on exertion, headaches, and some lightheadedness with standing.  No chest pain or syncopal episodes.  She has not had any recent infections, B symptoms, or lymphadenopathy.  She has not noticed any recent bleeding (last episode of rectal bleeding was in early June); no melena, hematemesis, or epistaxis.  She continues to bruise easily in her extremities, as well as ongoing abdominal bruising at the locations of her insulin injections.  She reports cramping abdominal pain before having a bowel movement.  She has 25% energy and 100% appetite. She endorses that she is maintaining a stable weight.  ASSESSMENT & PLAN:  1.   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  Normal duodenum. -  Colonoscopy (06/29/2019): Perianal skin tags found on perianal exam.  2 small polyps at the splenic flexure.  External hemorrhoids. - BMBX (09/09/2020): Normocellular marrow (20%) with TLH, including minimal dyserythropoiesis and 1% blasts by manual aspirate differential.  Mild dyserythropoiesis which does not fulfill criteria for MDS.  Karyotype 45, X, -X[4]/46, XX[16], loss of 1 X chromosome is rare finding in the bone marrow is of older woman and when present as a minor clone (less than 50% of 20 metaphases) is thought to be most likely aging effect. - Labs from December 2023 showed normal B12 of, folate, copper, SPEP.  CKD stage II/IIIa based on creatinine (CKD stage IIIb based on elevated Cystatin C 1.78) - History of PRBC transfusions x 2 in October 2023 - She received Feraheme x 3 from 06/07/2022 through 06/23/2022 - Started on Retacrit 08/11/2022 - current dose 10,000 units every 2 weeks - Labs today (09/08/2022): Hgb 9.6/MCV 101.3, ferritin 37, iron saturation 17% - Intermittent hematochezia thought to be from bleeding hemorrhoids.  No melena. - PLAN: Continue Retacrit 10,000 units every 2 weeks in the setting of anemia of CKD as well as dyserythropoiesis noted on BMBX from July 2022. - Schedule for IV Feraheme x 2  - CBC/differential + BB sample every 2 weeks - Recheck CBC/D, CMP, and iron panel with RTC in 2 months - If any severe derangements beyond baseline cytopenias, would consider repeat bone marrow biopsy  2.  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  Normal duodenum. - Colonoscopy (06/29/2019): Perianal skin tags found on perianal exam.  2 small polyps at the splenic flexure.  External hemorrhoids. - BMBX (09/09/2020): Normocellular marrow (20%) with TLH, including minimal dyserythropoiesis and 1% blasts by manual aspirate differential.  Mild dyserythropoiesis which  does not fulfill criteria for MDS.  Karyotype 45, X, -X[4]/46, XX[16], loss of 1 X chromosome is rare finding in the bone marrow is of older woman and when present as a minor clone (less than 50% of 20 metaphases) is thought to be most likely aging effect. - CT AP (04/24/2021): Spleen enlarged measuring 14.1 cm with volume 632 mL.  Liver has nodular contour consistent with cirrhosis. - Labs from December 2023 showed normal B12 of, folate, copper, SPEP.  CKD stage IIIa/b. - No B symptoms or recent infections. She has easy bruising.   - Most recent labs (09/08/2022): Platelets 43.  WBC 2.3/ANC 1.3. - PLAN: We will continue active surveillance with repeat CBC/differential in 2 months  - If any severe derangements beyond baseline cytopenias, would consider repeat bone marrow biopsy  3.  Easy bruising - Per patient report, PCP had concerns regarding her easy bruising - Additional labs were checked on 08/04/2022, which showed normal fibrinogen, normal APTT, and normal INR.  Von Willebrand panel was not consistent with diagnosis of von Willebrand's disease. 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  4.  Social/family history: - Other PMH includes NASH cirrhosis, diabetes, arthritis, carotid artery disease her daughter lives with her at home.  She is independent of ADLs and IADLs.  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  Quit smoking in November 1992. - Maternal grandmother had low platelets.  Sister had low platelet. - Mother had colon cancer.  Sister had stomach cancer.  Paternal uncle had lung cancer.   PLAN SUMMARY:  >> CBC/BB sample + Retacrit every 2 weeks >> IV Feraheme x 2 >> Same-day labs (CBC/D, BB sample, ferritin, iron/TIBC, CMP) + injection + OFFICE visit in 2 months      REVIEW OF SYSTEMS:  Review of Systems  Constitutional:  Positive for fatigue. Negative for appetite change, chills, diaphoresis, fever and unexpected weight  change.  HENT:   Negative for lump/mass and nosebleeds.   Eyes:  Negative for eye problems.  Respiratory:  Positive for shortness of breath (with exertion). Negative for cough and hemoptysis.   Cardiovascular:  Negative for chest pain, leg swelling and palpitations.  Gastrointestinal:  Positive for abdominal pain (before bowel movements). Negative for blood in stool, constipation, diarrhea, nausea and vomiting.  Genitourinary:  Negative for hematuria.   Musculoskeletal:  Positive for arthralgias.  Skin:  Positive for itching.  Neurological:  Positive for headaches, light-headedness and numbness. Negative for dizziness.  Hematological:  Bruises/bleeds easily.  Psychiatric/Behavioral:  Positive for sleep disturbance. Negative for depression. The patient is not nervous/anxious.      PHYSICAL EXAM:  ECOG PERFORMANCE STATUS: 1 - Symptomatic but completely ambulatory  Vitals:   09/08/22 1018 09/08/22 1021  BP: (!) 153/58 (!) 146/54  Pulse: 72   Resp: 18   Temp: 98 F (36.7 C)   SpO2: 99%    Filed Weights   09/08/22 1018  Weight: 136 lb (61.7 kg)   Physical Exam Constitutional:      Appearance: Normal appearance. She is normal weight.     Comments: Weak-appearing  Cardiovascular:     Heart sounds: Normal heart sounds.  Pulmonary:  Breath sounds: Normal breath sounds.  Skin:    Findings: Bruising present.  Neurological:     General: No focal deficit present.     Mental Status: Mental status is at baseline.  Psychiatric:        Behavior: Behavior normal. Behavior is cooperative.     PAST MEDICAL/SURGICAL HISTORY:  Past Medical History:  Diagnosis Date   Abnormal LFTs    Achilles bursitis or tendinitis    Acute maxillary sinusitis    Acute pharyngitis    Anemia, unspecified    Candidiasis of skin and nails    Dermatophytosis of foot    Dermatophytosis of the body    Diverticulitis of colon (without mention of hemorrhage)(562.11)    Edema    Headache(784.0)     HOH (hard of hearing)    Malaise and fatigue    Occlusion and stenosis of carotid artery without mention of cerebral infarction    Osteoarthrosis, unspecified whether generalized or localized, unspecified site    Other dyspnea and respiratory abnormality    Other psoriasis    Other specified disorders of rotator cuff syndrome of shoulder and allied disorders    Pain in joint    Pure hypercholesterolemia    RUQ pain    Spasm of back muscles    Thrombocytopenia, unspecified (HCC)    Type II or unspecified type diabetes mellitus without mention of complication, not stated as uncontrolled    Unspecified essential hypertension    Unspecified sinusitis (chronic)    Urinary incontinence    Past Surgical History:  Procedure Laterality Date   APPENDECTOMY  1964   BIOPSY  03/02/2017   Procedure: BIOPSY;  Surgeon: Malissa Hippo, MD;  [facility];  Service: Endoscopy;;  antral   BIOPSY  06/17/2020   Procedure: BIOPSY;  Surgeon: Dolores Frame, MD;  [facility];  Service: Gastroenterology;;   CAROTID ENDARTERECTOMY  2005   Left CEA   CAROTID ENDARTERECTOMY  2009   Right CEA   CATARACT EXTRACTION, BILATERAL Bilateral    CHOLECYSTECTOMY  1982   Gall Bladder   COLONOSCOPY  09   COLONOSCOPY N/A 06/27/2019   Procedure: COLONOSCOPY;  Surgeon: Malissa Hippo, MD;  [facility];  Service: Endoscopy;  Laterality: N/A;   ESOPHAGOGASTRODUODENOSCOPY N/A 03/02/2017   Procedure: ESOPHAGOGASTRODUODENOSCOPY (EGD);  Surgeon: Malissa Hippo, MD;  [facility];  Service: Endoscopy;  Laterality: N/A;  12:55   ESOPHAGOGASTRODUODENOSCOPY N/A 06/27/2019   Procedure: ESOPHAGOGASTRODUODENOSCOPY (EGD);  Surgeon: Malissa Hippo, MD;  [facility];  Service: Endoscopy;  Laterality: N/A;  730   ESOPHAGOGASTRODUODENOSCOPY (EGD) WITH PROPOFOL N/A 06/17/2020   Procedure: ESOPHAGOGASTRODUODENOSCOPY (EGD) WITH PROPOFOL;  Surgeon: Dolores Frame,  MD;  [facility];  Service: Gastroenterology;  Laterality: N/A;  AM   GIVENS CAPSULE STUDY N/A 10/24/2019   Procedure: GIVENS CAPSULE STUDY;  Surgeon: Malissa Hippo, MD;  [facility];  Service: Endoscopy;  Laterality: N/A;  730   KNEE ARTHROSCOPY WITH MEDIAL MENISECTOMY Left 07/10/2019   Procedure: KNEE ARTHROSCOPY WITH MEDIAL MENISCECTOMY;  Surgeon: Vickki Hearing, MD;  [facility]: [facility];  Service: Orthopedics;  Laterality: Left;   KNEE ARTHROSCOPY WITH MEDIAL MENISECTOMY Left 03/14/2020   Procedure: KNEE ARTHROSCOPY WITH MEDIAL MENISCECTOMY;  Surgeon: Vickki Hearing, MD;  [facility];  Service: Orthopedics;  Laterality: Left;   NOSE SURGERY  1978   PARATHYROIDECTOMY  1992   POLYPECTOMY  03/02/2017  Procedure: POLYPECTOMY;  Surgeon: Malissa Hippo, MD;  [facility];  Service: Endoscopy;;  gastric    POLYPECTOMY  06/27/2019   Procedure: POLYPECTOMY;  Surgeon: Malissa Hippo, MD;  [facility];  Service: Endoscopy;;  colon   Power port  03/06/2009   Right upper chest    TONSILLECTOMY     TUMOR EXCISION Right August 24, 2013   [facility] Dr. Haynes Hoehn, ENT   VAGINAL HYSTERECTOMY  [phone]    SOCIAL HISTORY:  Social History   Socioeconomic History   Marital status: Widowed    Spouse name: Not on file   Number of children: Not on file   Years of education: Not on file   Highest education level: Not on file  Occupational History   Not on file  Tobacco Use   Smoking status: Former    Current packs/day: 0.00    Average packs/day: 1 pack/day for 42.0 years (42.0 ttl pk-yrs)    Types: Cigarettes    Start date: 03/12/1950    Quit date: 03/12/1992    Years since quitting: 30.5    Passive exposure: Past   Smokeless tobacco: Former    Quit date: 12/16/1992  Vaping Use   Vaping status: Never Used  Substance and Sexual Activity   Alcohol use: Yes    Alcohol/week: 1.0 standard drink of alcohol    Types: 1  Glasses of wine per week    Comment: very seldom   Drug use: No   Sexual activity: Not Currently  Other Topics Concern   Not on file  Social History Narrative   Not on file   Social Determinants of Health   Financial Resource Strain: Low Risk  (04/07/2021)   Received from [facility], [facility]   Overall Financial Resource Strain (CARDIA)    Difficulty of Paying Living Expenses: Not hard at all  Food Insecurity: No Food Insecurity (04/07/2021)   Received from [facility], [facility]   Hunger Vital Sign    Worried About Running Out of Food in the Last Year: Never true    Ran Out of Food in the Last Year: Never true  Transportation Needs: No Transportation Needs (04/07/2021)   Received from [facility], [facility]   [facility] - Transportation    Lack of Transportation (Medical): No    Lack of Transportation (Non-Medical): No  Physical Activity: Inactive (04/07/2021)   Received from [facility], [facility]   Exercise Vital Sign    Days of Exercise per Week: 0 days    Minutes of Exercise per Session: 0 min  Stress: No Stress Concern Present (04/07/2021)   Received from [facility], [facility] of Occupational Health - Occupational Stress Questionnaire    Feeling of Stress : Not at all  Social Connections: Moderately Isolated (04/07/2021)   Received from [facility], [facility]   Social Connection and Isolation Panel [NHANES]    Frequency of Communication with Friends and Family: More than three times a week    Frequency of Social Gatherings with Friends and Family: Twice a week    Attends Religious Services: Never    Database administrator or Organizations: No    Attends Banker Meetings: Never    Marital Status: Married  Catering manager Violence: Not At Risk (04/07/2021)   Received from [facility], [facility]  Humiliation, Afraid, Rape, and Kick questionnaire    Fear of Current or  Ex-Partner: No    Emotionally Abused: No    Physically Abused: No    Sexually Abused: No    FAMILY HISTORY:  Family History  Problem Relation Age of Onset   Heart attack Mother    Cancer Mother    Heart attack Father    Cancer Sister     CURRENT MEDICATIONS:  Outpatient Encounter Medications as of 09/08/2022  Medication Sig Note   acetaminophen (TYLENOL) 500 MG tablet Take 1 tablet (500 mg total) by mouth every 6 (six) hours as needed for moderate pain or headache.    aspirin EC 81 MG tablet Take 81 mg by mouth every other day. AT NIGHT.    B-D UF III MINI PEN NEEDLES 31G X 5 MM MISC SMARTSIG:1 Pen Needle SUB-Q Daily    Biotin 10 MG CAPS Take by mouth daily at 6 (six) AM.    carvedilol (COREG) 3.125 MG tablet Take 3.125 mg by mouth 2 (two) times daily with a meal.    cholecalciferol (VITAMIN D) 25 MCG (1000 UNIT) tablet Take 1,000 Units by mouth daily.    dicyclomine (BENTYL) 10 MG capsule TAKE 1 CAPSULE (10 MG TOTAL) BY MOUTH 3 (THREE) TIMES DAILY BEFORE MEALS.    furosemide (LASIX) 20 MG tablet Take 20 mg by mouth as needed.    gabapentin (NEURONTIN) 300 MG capsule Take 300 mg by mouth at bedtime.    losartan (COZAAR) 25 MG tablet Take 25 mg by mouth daily.    metFORMIN (GLUCOPHAGE) 500 MG tablet Take 500 mg by mouth 2 (two) times daily with a meal.    OVER THE COUNTER MEDICATION Iron as needed. Not daily    OVER THE COUNTER MEDICATION Compounded Hemorrhoid cream [facility]) Apply rectally up to four times per day. 04/26/2022: prn   TRESIBA FLEXTOUCH 100 UNIT/ML FlexTouch Pen SMARTSIG:10 Unit(s) SUB-Q Daily    Facility-Administered Encounter Medications as of 09/08/2022  Medication   sodium chloride irrigation 0.9 %    ALLERGIES:  Allergies  Allergen Reactions   Codeine Nausea And Vomiting and Rash   [place] Purpureus Went Yeast Other (See Comments)    Headaches, myalgias   Niacin And Related Other (See Comments)    Headaches, myalgias   Red Yeast Rice Other  (See Comments)    Headaches, myalgias   Actos [Pioglitazone Hydrochloride] Other (See Comments)    Severe headache   Amaryl Nausea Only and Other (See Comments)    Severe headache (patient is tolerating 2 mg dosing)   Iron Diarrhea   Sanctura [Trospium Chloride] Nausea Only and Other (See Comments)    Severe headache   Statins Other (See Comments)    Severe headache,  Hips and leg weakness that cause patient not to be able to function as per patient.   Toviaz [Fesoterodine Fumarate] Other (See Comments)    Cramps, severe headache   Norco [Hydrocodone-Acetaminophen] Nausea And Vomiting and Anxiety   Penicillins Swelling, Rash and Other (See Comments)    Has patient had a PCN reaction causing immediate rash, facial/tongue/throat swelling, SOB or lightheadedness with hypotension: No Has patient had a PCN reaction causing severe rash involving mucus membranes or skin necrosis: No Has patient had a PCN reaction that required hospitalization: Yes Has patient had a PCN reaction occurring within the last 10 years: No If all of the above answers are "NO", then may proceed with Cephalosporin use.    Ultram Marcia Brash  Hcl] Nausea And Vomiting    LABORATORY DATA:  I have reviewed the labs as listed.  CBC    Component Value Date/Time   WBC 2.3 (L) 09/08/2022 0917   RBC 3.06 (L) 09/08/2022 0917   HGB 9.6 (L) 09/08/2022 0917   HCT 31.0 (L) 09/08/2022 0917   PLT 43 (L) 09/08/2022 0917   MCV 101.3 (H) 09/08/2022 0917   MCH 31.4 09/08/2022 0917   MCHC 31.0 09/08/2022 0917   RDW 14.1 09/08/2022 0917   LYMPHSABS 0.7 09/08/2022 0917   MONOABS 0.1 09/08/2022 0917   EOSABS 0.1 09/08/2022 0917   BASOSABS 0.0 09/08/2022 0917      Latest Ref Rng & Units 09/08/2022    9:17 AM 04/26/2022    1:27 PM 04/24/2021    3:44 PM  CMP  Glucose 70 - 99 mg/dL 161  096    BUN 8 - 23 mg/dL 31  24    Creatinine 0.45 - 1.00 mg/dL 4.09  8.11  9.14   Sodium 135 - 145 mmol/L 138  144    Potassium 3.5 - 5.1 mmol/L  4.5  4.2    Chloride 98 - 111 mmol/L 106  107    CO2 22 - 32 mmol/L 25  26    Calcium 8.9 - 10.3 mg/dL 9.1  9.5    Total Protein 6.5 - 8.1 g/dL 6.9  6.6    Total Bilirubin 0.3 - 1.2 mg/dL 0.7  0.5    Alkaline Phos 38 - 126 U/L 102     AST 15 - 41 U/L 42  36    ALT 0 - 44 U/L 40  30      DIAGNOSTIC IMAGING:  I have independently reviewed the relevant imaging and discussed with the patient.   WRAP UP:  All questions were answered. The patient knows to call the clinic with any problems, questions or concerns.  Medical decision making: Moderate  Time spent on visit: I spent 20 minutes counseling the patient face to face. The total time spent in the appointment was 30 minutes and more than 50% was on counseling.  Carnella Guadalajara, PA-C  09/08/22 1:13 PM

## 2022-09-08 ENCOUNTER — Inpatient Hospital Stay (HOSPITAL_BASED_OUTPATIENT_CLINIC_OR_DEPARTMENT_OTHER): Payer: Medicare Other | Admitting: Physician Assistant

## 2022-09-08 ENCOUNTER — Inpatient Hospital Stay: Payer: Medicare Other

## 2022-09-08 VITALS — BP 146/54 | HR 72 | Temp 98.0°F | Resp 18 | Wt 136.0 lb

## 2022-09-08 DIAGNOSIS — N1831 Chronic kidney disease, stage 3a: Secondary | ICD-10-CM

## 2022-09-08 DIAGNOSIS — D696 Thrombocytopenia, unspecified: Secondary | ICD-10-CM

## 2022-09-08 DIAGNOSIS — D509 Iron deficiency anemia, unspecified: Secondary | ICD-10-CM

## 2022-09-08 DIAGNOSIS — D631 Anemia in chronic kidney disease: Secondary | ICD-10-CM

## 2022-09-08 DIAGNOSIS — D500 Iron deficiency anemia secondary to blood loss (chronic): Secondary | ICD-10-CM

## 2022-09-08 DIAGNOSIS — Z87891 Personal history of nicotine dependence: Secondary | ICD-10-CM | POA: Diagnosis not present

## 2022-09-08 LAB — CBC WITH DIFFERENTIAL/PLATELET
Abs Immature Granulocytes: 0.01 10*3/uL (ref 0.00–0.07)
Basophils Absolute: 0 10*3/uL (ref 0.0–0.1)
Basophils Relative: 0 %
Eosinophils Absolute: 0.1 10*3/uL (ref 0.0–0.5)
Eosinophils Relative: 3 %
HCT: 31 % — ABNORMAL LOW (ref 36.0–46.0)
Hemoglobin: 9.6 g/dL — ABNORMAL LOW (ref 12.0–15.0)
Immature Granulocytes: 0 %
Lymphocytes Relative: 32 %
Lymphs Abs: 0.7 10*3/uL (ref 0.7–4.0)
MCH: 31.4 pg (ref 26.0–34.0)
MCHC: 31 g/dL (ref 30.0–36.0)
MCV: 101.3 fL — ABNORMAL HIGH (ref 80.0–100.0)
Monocytes Absolute: 0.1 10*3/uL (ref 0.1–1.0)
Monocytes Relative: 6 %
Neutro Abs: 1.3 10*3/uL — ABNORMAL LOW (ref 1.7–7.7)
Neutrophils Relative %: 59 %
Platelets: 43 10*3/uL — ABNORMAL LOW (ref 150–400)
RBC: 3.06 MIL/uL — ABNORMAL LOW (ref 3.87–5.11)
RDW: 14.1 % (ref 11.5–15.5)
WBC: 2.3 10*3/uL — ABNORMAL LOW (ref 4.0–10.5)
nRBC: 0 % (ref 0.0–0.2)

## 2022-09-08 LAB — COMPREHENSIVE METABOLIC PANEL
ALT: 40 U/L (ref 0–44)
AST: 42 U/L — ABNORMAL HIGH (ref 15–41)
Albumin: 3.5 g/dL (ref 3.5–5.0)
Alkaline Phosphatase: 102 U/L (ref 38–126)
Anion gap: 7 (ref 5–15)
BUN: 31 mg/dL — ABNORMAL HIGH (ref 8–23)
CO2: 25 mmol/L (ref 22–32)
Calcium: 9.1 mg/dL (ref 8.9–10.3)
Chloride: 106 mmol/L (ref 98–111)
Creatinine, Ser: 0.88 mg/dL (ref 0.44–1.00)
GFR, Estimated: >60 mL/min (ref >60)
Glucose, Bld: 125 mg/dL — ABNORMAL HIGH (ref 70–99)
Potassium: 4.5 mmol/L (ref 3.5–5.1)
Sodium: 138 mmol/L (ref 135–145)
Total Bilirubin: 0.7 mg/dL (ref 0.3–1.2)
Total Protein: 6.9 g/dL (ref 6.5–8.1)

## 2022-09-08 LAB — FERRITIN: Ferritin: 37 ng/mL (ref 11–307)

## 2022-09-08 LAB — IRON AND TIBC
Iron: 73 ug/dL (ref 28–170)
Saturation Ratios: 17 % (ref 10.4–31.8)
TIBC: 442 ug/dL (ref 250–450)
UIBC: 369 ug/dL

## 2022-09-08 LAB — SAMPLE TO BLOOD BANK

## 2022-09-08 MED ORDER — EPOETIN ALFA-EPBX 10000 UNIT/ML IJ SOLN
10000.0000 [IU] | Freq: Once | INTRAMUSCULAR | Status: AC
  Administered 2022-09-08: 10000 [IU] via SUBCUTANEOUS
  Filled 2022-09-08: qty 1

## 2022-09-08 NOTE — Progress Notes (Signed)
Retacrit injection given per orders. Patient tolerated it well without problems. Vitals stable and discharged home from clinic ambulatory. Follow up as scheduled.  

## 2022-09-08 NOTE — Patient Instructions (Signed)
[facility] at [facility] **VISIT SUMMARY & IMPORTANT INSTRUCTIONS **   You were seen today by Rojelio Brenner PA-C for your follow-up visit.    ANEMIA DUE TO IRON DEFICIENCY & CHRONIC KIDNEY DISEASE Your blood and iron levels are still low, but are improved. We will schedule you for IV iron x 2 doses Continue RETACRIT (epoetin alfa injection) to help your body make more blood cells. We will continue to check your blood counts every 2 weeks on the same day that you receive the Retacrit injection.  LOW PLATELETS & WHITE BLOOD CELLS Your low platelets and low white blood cells remain low, but are overall stable. 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 The other labs that we checked did not show any other reason for you to have any severe bruising.  Your bruising is most likely related to your low platelets.  FOLLOW-UP APPOINTMENT: Office visit in 2 months  ** Thank you for trusting me with your healthcare!  I strive to provide all of my patients with quality care at each visit.  If you receive a survey for this visit, I would be so grateful to you for taking the time to provide feedback.  Thank you in advance!  ~ Enna Warwick                   Dr. Doreatha Massed   &   Rojelio Brenner, PA-C   - - - - - - - - - - - - - - - - - -    Thank you for choosing [facility] at [facility] to provide your oncology and hematology care.  To afford each patient quality time with our provider, please arrive at least 15 minutes before your scheduled appointment time.   If you have a lab appointment with the Cancer Center please come in thru the Main Entrance and check in at the main information desk.  You need to re-schedule your appointment should you arrive 10 or more minutes late.  We strive to give you quality time with our  providers, and arriving late affects you and other patients whose appointments are after yours.  Also, if you no show three or more times for appointments you may be dismissed from the clinic at the providers discretion.     Again, thank you for choosing [facility].  Our hope is that these requests will decrease the amount of time that you wait before being seen by our physicians.       _____________________________________________________________  Should you have questions after your visit to [facility], please contact our office at [phone] and follow the prompts.  Our office hours are 8:00 a.m. and 4:30 p.m. Monday - Friday.  Please note that voicemails left after 4:00 p.m. may not be returned until the following business day.  We are closed weekends and major holidays.  You do have access to a nurse 24-7, just call the main number to the clinic [phone] and do not press any options, hold on the line and a nurse will answer the phone.    For prescription refill requests, have your pharmacy contact our office and allow 72 hours.

## 2022-09-16 MED FILL — Ferumoxytol Inj 510 MG/17ML (30 MG/ML) (Elemental Fe): INTRAVENOUS | Qty: 17 | Status: AC

## 2022-09-17 ENCOUNTER — Inpatient Hospital Stay: Payer: Medicare Other | Attending: Hematology

## 2022-09-17 VITALS — BP 168/52 | HR 69 | Temp 97.6°F | Resp 18

## 2022-09-17 DIAGNOSIS — N1831 Chronic kidney disease, stage 3a: Secondary | ICD-10-CM

## 2022-09-17 DIAGNOSIS — D509 Iron deficiency anemia, unspecified: Secondary | ICD-10-CM | POA: Diagnosis not present

## 2022-09-17 DIAGNOSIS — D631 Anemia in chronic kidney disease: Secondary | ICD-10-CM | POA: Insufficient documentation

## 2022-09-17 DIAGNOSIS — N183 Chronic kidney disease, stage 3 unspecified: Secondary | ICD-10-CM | POA: Diagnosis not present

## 2022-09-17 MED ORDER — SODIUM CHLORIDE 0.9 % IV SOLN: Freq: Once | INTRAVENOUS | Status: AC

## 2022-09-17 MED ORDER — SODIUM CHLORIDE 0.9 % IV SOLN
510.0000 mg | Freq: Once | INTRAVENOUS | Status: AC
  Administered 2022-09-17: 510 mg via INTRAVENOUS
  Filled 2022-09-17: qty 510

## 2022-09-17 NOTE — Progress Notes (Signed)
Patient presents today for iron infusion.  Patient is in satisfactory condition with no new complaints voiced.  Vital signs are stable.  IV placed in R wrist.  IV flushed well with good blood return noted.  Zyrtec was taken at 0730 at home prior to visit.  We will proceed with infusion per provider orders.    Patient tolerated infusion well with no complaints voiced.  Patient left ambulatory with sister in stable condition.  Vital signs stable at discharge.  Follow up as scheduled.

## 2022-09-17 NOTE — Patient Instructions (Signed)
[facility]  Discharge Instructions: Thank you for choosing [facility] to provide your oncology and hematology care.  If you have a lab appointment with the Cancer Center - please note that after April 8th, 2024, all labs will be drawn in the cancer center.  You do not have to check in or register with the main entrance as you have in the past but will complete your check-in in the cancer center.  Wear comfortable clothing and clothing appropriate for easy access to any Portacath or PICC line.   We strive to give you quality time with your provider. You may need to reschedule your appointment if you arrive late (15 or more minutes).  Arriving late affects you and other patients whose appointments are after yours.  Also, if you miss three or more appointments without notifying the office, you may be dismissed from the clinic at the provider's discretion.      For prescription refill requests, have your pharmacy contact our office and allow 72 hours for refills to be completed.    Today you received the following:  Feraheme.  Ferumoxytol Injection What is this medication? FERUMOXYTOL (FER ue MOX i tol) treats low levels of iron in your body (iron deficiency anemia). Iron is a mineral that plays an important role in making red blood cells, which carry oxygen from your lungs to the rest of your body. This medicine may be used for other purposes; ask your health care provider or pharmacist if you have questions. COMMON BRAND NAME(S): Feraheme What should I tell my care team before I take this medication? They need to know if you have any of these conditions: Anemia not caused by low iron levels High levels of iron in the blood Magnetic resonance imaging (MRI) test scheduled An unusual or allergic reaction to iron, other medications, foods, dyes, or preservatives Pregnant or trying to get pregnant Breastfeeding How should I use this medication? This medication  is injected into a vein. It is given by your care team in a hospital or clinic setting. Talk to your care team the use of this medication in children. Special care may be needed. Overdosage: If you think you have taken too much of this medicine contact a poison control center or emergency room at once. NOTE: This medicine is only for you. Do not share this medicine with others. What if I miss a dose? It is important not to miss your dose. Call your care team if you are unable to keep an appointment. What may interact with this medication? Other iron products This list may not describe all possible interactions. Give your health care provider a list of all the medicines, herbs, non-prescription drugs, or dietary supplements you use. Also tell them if you smoke, drink alcohol, or use illegal drugs. Some items may interact with your medicine. What should I watch for while using this medication? Visit your care team regularly. Tell your care team if your symptoms do not start to get better or if they get worse. You may need blood work done while you are taking this medication. You may need to follow a special diet. Talk to your care team. Foods that contain iron include: whole grains/cereals, dried fruits, beans, or peas, leafy green vegetables, and organ meats (liver, kidney). What side effects may I notice from receiving this medication? Side effects that you should report to your care team as soon as possible: Allergic reactions--skin rash, itching, hives, swelling of the  face, lips, tongue, or throat Low blood pressure--dizziness, feeling faint or lightheaded, blurry vision Shortness of breath Side effects that usually do not require medical attention (report to your care team if they continue or are bothersome): Flushing Headache Joint pain Muscle pain Nausea Pain, redness, or irritation at injection site This list may not describe all possible side effects. Call your doctor for medical  advice about side effects. You may report side effects to FDA at 1-800-FDA-1088. Where should I keep my medication? This medication is given in a hospital or clinic. It will not be stored at home. NOTE: This sheet is a summary. It may not cover all possible information. If you have questions about this medicine, talk to your doctor, pharmacist, or health care provider.  2024 Elsevier/Gold Standard (2022-07-09 00:00:00)     To help prevent nausea and vomiting after your treatment, we encourage you to take your nausea medication as directed.  BELOW ARE SYMPTOMS THAT SHOULD BE REPORTED IMMEDIATELY: *FEVER GREATER THAN 100.4 F (38 C) OR HIGHER *CHILLS OR SWEATING *NAUSEA AND VOMITING THAT IS NOT CONTROLLED WITH YOUR NAUSEA MEDICATION *UNUSUAL SHORTNESS OF BREATH *UNUSUAL BRUISING OR BLEEDING *URINARY PROBLEMS (pain or burning when urinating, or frequent urination) *BOWEL PROBLEMS (unusual diarrhea, constipation, pain near the anus) TENDERNESS IN MOUTH AND THROAT WITH OR WITHOUT PRESENCE OF ULCERS (sore throat, sores in mouth, or a toothache) UNUSUAL RASH, SWELLING OR PAIN  UNUSUAL VAGINAL DISCHARGE OR ITCHING   Items with * indicate a potential emergency and should be followed up as soon as possible or go to the Emergency Department if any problems should occur.  Please show the CHEMOTHERAPY ALERT CARD or IMMUNOTHERAPY ALERT CARD at check-in to the Emergency Department and triage nurse.  Should you have questions after your visit or need to cancel or reschedule your appointment, please contact [facility] [phone]  and follow the prompts.  Office hours are 8:00 a.m. to 4:30 p.m. Monday - Friday. Please note that voicemails left after 4:00 p.m. may not be returned until the following business day.  We are closed weekends and major holidays. You have access to a nurse at all times for urgent questions. Please call the main number to the clinic [phone] and follow  the prompts.  For any non-urgent questions, you may also contact your provider using MyChart. We now offer e-Visits for anyone 100 and older to request care online for non-urgent symptoms. For details visit [email or web address].   Also download the MyChart app! Go to the app store, search "MyChart", open the app, select [place], and log in with your MyChart username and password.

## 2022-09-22 ENCOUNTER — Inpatient Hospital Stay: Payer: Medicare Other

## 2022-09-22 VITALS — BP 134/54 | HR 62 | Temp 97.4°F | Resp 18

## 2022-09-22 DIAGNOSIS — N1831 Chronic kidney disease, stage 3a: Secondary | ICD-10-CM

## 2022-09-22 DIAGNOSIS — D509 Iron deficiency anemia, unspecified: Secondary | ICD-10-CM | POA: Diagnosis not present

## 2022-09-22 DIAGNOSIS — N183 Chronic kidney disease, stage 3 unspecified: Secondary | ICD-10-CM | POA: Diagnosis not present

## 2022-09-22 DIAGNOSIS — D696 Thrombocytopenia, unspecified: Secondary | ICD-10-CM

## 2022-09-22 DIAGNOSIS — D500 Iron deficiency anemia secondary to blood loss (chronic): Secondary | ICD-10-CM

## 2022-09-22 DIAGNOSIS — D631 Anemia in chronic kidney disease: Secondary | ICD-10-CM | POA: Diagnosis not present

## 2022-09-22 LAB — CBC
HCT: 32.7 % — ABNORMAL LOW (ref 36.0–46.0)
Hemoglobin: 10.1 g/dL — ABNORMAL LOW (ref 12.0–15.0)
MCH: 31.3 pg (ref 26.0–34.0)
MCHC: 30.9 g/dL (ref 30.0–36.0)
MCV: 101.2 fL — ABNORMAL HIGH (ref 80.0–100.0)
Platelets: 58 10*3/uL — ABNORMAL LOW (ref 150–400)
RBC: 3.23 MIL/uL — ABNORMAL LOW (ref 3.87–5.11)
RDW: 14.7 % (ref 11.5–15.5)
WBC: 3.2 10*3/uL — ABNORMAL LOW (ref 4.0–10.5)
nRBC: 0 % (ref 0.0–0.2)

## 2022-09-22 LAB — SAMPLE TO BLOOD BANK

## 2022-09-22 MED ORDER — EPOETIN ALFA-EPBX 10000 UNIT/ML IJ SOLN
10000.0000 [IU] | Freq: Once | INTRAMUSCULAR | Status: AC
  Administered 2022-09-22: 10000 [IU] via SUBCUTANEOUS
  Filled 2022-09-22: qty 1

## 2022-09-22 NOTE — Progress Notes (Signed)
Patient tolerated injection with no complaints voiced.  Site clean and dry with no bruising or swelling noted at site.  See MAR for details.  Band aid applied.  Patient stable during and after injection.  Vss with discharge and left in satisfactory condition with no s/s of distress noted.  

## 2022-09-22 NOTE — Patient Instructions (Signed)

## 2022-09-28 DIAGNOSIS — H43393 Other vitreous opacities, bilateral: Secondary | ICD-10-CM | POA: Diagnosis not present

## 2022-09-28 DIAGNOSIS — H353132 Nonexudative age-related macular degeneration, bilateral, intermediate dry stage: Secondary | ICD-10-CM | POA: Diagnosis not present

## 2022-10-01 ENCOUNTER — Inpatient Hospital Stay: Payer: Medicare Other

## 2022-10-01 VITALS — BP 150/49 | HR 63 | Temp 96.7°F | Resp 18

## 2022-10-01 DIAGNOSIS — D509 Iron deficiency anemia, unspecified: Secondary | ICD-10-CM

## 2022-10-01 DIAGNOSIS — N183 Chronic kidney disease, stage 3 unspecified: Secondary | ICD-10-CM | POA: Diagnosis not present

## 2022-10-01 DIAGNOSIS — D631 Anemia in chronic kidney disease: Secondary | ICD-10-CM

## 2022-10-01 MED ORDER — SODIUM CHLORIDE 0.9 % IV SOLN: Freq: Once | INTRAVENOUS | Status: AC

## 2022-10-01 MED ORDER — CETIRIZINE HCL 10 MG PO TABS
10.0000 mg | ORAL_TABLET | Freq: Once | ORAL | Status: AC
  Administered 2022-10-01: 10 mg via ORAL
  Filled 2022-10-01: qty 1

## 2022-10-01 MED ORDER — SODIUM CHLORIDE 0.9 % IV SOLN
510.0000 mg | Freq: Once | INTRAVENOUS | Status: AC
  Administered 2022-10-01: 510 mg via INTRAVENOUS
  Filled 2022-10-01: qty 510

## 2022-10-01 NOTE — Patient Instructions (Signed)
[facility]  Discharge Instructions: Thank you for choosing [facility] to provide your oncology and hematology care.  If you have a lab appointment with the Cancer Center - please note that after April 8th, 2024, all labs will be drawn in the cancer center.  You do not have to check in or register with the main entrance as you have in the past but will complete your check-in in the cancer center.  Wear comfortable clothing and clothing appropriate for easy access to any Portacath or PICC line.   We strive to give you quality time with your provider. You may need to reschedule your appointment if you arrive late (15 or more minutes).  Arriving late affects you and other patients whose appointments are after yours.  Also, if you miss three or more appointments without notifying the office, you may be dismissed from the clinic at the provider's discretion.      For prescription refill requests, have your pharmacy contact our office and allow 72 hours for refills to be completed.    Today you received an iron infusion, feraheme   To help prevent nausea and vomiting after your treatment, we encourage you to take your nausea medication as directed.  BELOW ARE SYMPTOMS THAT SHOULD BE REPORTED IMMEDIATELY: *FEVER GREATER THAN 100.4 F (38 C) OR HIGHER *CHILLS OR SWEATING *NAUSEA AND VOMITING THAT IS NOT CONTROLLED WITH YOUR NAUSEA MEDICATION *UNUSUAL SHORTNESS OF BREATH *UNUSUAL BRUISING OR BLEEDING *URINARY PROBLEMS (pain or burning when urinating, or frequent urination) *BOWEL PROBLEMS (unusual diarrhea, constipation, pain near the anus) TENDERNESS IN MOUTH AND THROAT WITH OR WITHOUT PRESENCE OF ULCERS (sore throat, sores in mouth, or a toothache) UNUSUAL RASH, SWELLING OR PAIN  UNUSUAL VAGINAL DISCHARGE OR ITCHING   Items with * indicate a potential emergency and should be followed up as soon as possible or go to the Emergency Department if any problems  should occur.  Please show the CHEMOTHERAPY ALERT CARD or IMMUNOTHERAPY ALERT CARD at check-in to the Emergency Department and triage nurse.  Should you have questions after your visit or need to cancel or reschedule your appointment, please contact [facility] [phone]  and follow the prompts.  Office hours are 8:00 a.m. to 4:30 p.m. Monday - Friday. Please note that voicemails left after 4:00 p.m. may not be returned until the following business day.  We are closed weekends and major holidays. You have access to a nurse at all times for urgent questions. Please call the main number to the clinic [phone] and follow the prompts.  For any non-urgent questions, you may also contact your provider using MyChart. We now offer e-Visits for anyone 30 and older to request care online for non-urgent symptoms. For details visit [email or web address].   Also download the MyChart app! Go to the app store, search "MyChart", open the app, select [place], and log in with your MyChart username and password.

## 2022-10-01 NOTE — Progress Notes (Signed)
Feraheme infusion given per orders. Patient tolerated it well without problems. Vitals stable and discharged home from clinic ambulatory. Follow up as scheduled.  

## 2022-10-06 ENCOUNTER — Inpatient Hospital Stay: Payer: Medicare Other

## 2022-10-06 VITALS — BP 156/83 | HR 84 | Temp 97.6°F | Resp 18

## 2022-10-06 DIAGNOSIS — D500 Iron deficiency anemia secondary to blood loss (chronic): Secondary | ICD-10-CM

## 2022-10-06 DIAGNOSIS — D509 Iron deficiency anemia, unspecified: Secondary | ICD-10-CM | POA: Diagnosis not present

## 2022-10-06 DIAGNOSIS — N1831 Chronic kidney disease, stage 3a: Secondary | ICD-10-CM

## 2022-10-06 DIAGNOSIS — D696 Thrombocytopenia, unspecified: Secondary | ICD-10-CM

## 2022-10-06 DIAGNOSIS — D631 Anemia in chronic kidney disease: Secondary | ICD-10-CM | POA: Diagnosis not present

## 2022-10-06 DIAGNOSIS — N183 Chronic kidney disease, stage 3 unspecified: Secondary | ICD-10-CM | POA: Diagnosis not present

## 2022-10-06 LAB — CBC
HCT: 29.8 % — ABNORMAL LOW (ref 36.0–46.0)
Hemoglobin: 9.3 g/dL — ABNORMAL LOW (ref 12.0–15.0)
MCH: 32.6 pg (ref 26.0–34.0)
MCHC: 31.2 g/dL (ref 30.0–36.0)
MCV: 104.6 fL — ABNORMAL HIGH (ref 80.0–100.0)
Platelets: 43 10*3/uL — ABNORMAL LOW (ref 150–400)
RBC: 2.85 MIL/uL — ABNORMAL LOW (ref 3.87–5.11)
RDW: 16.6 % — ABNORMAL HIGH (ref 11.5–15.5)
WBC: 2 10*3/uL — ABNORMAL LOW (ref 4.0–10.5)
nRBC: 0 % (ref 0.0–0.2)

## 2022-10-06 LAB — SAMPLE TO BLOOD BANK

## 2022-10-06 MED ORDER — EPOETIN ALFA-EPBX 10000 UNIT/ML IJ SOLN
10000.0000 [IU] | Freq: Once | INTRAMUSCULAR | Status: AC
  Administered 2022-10-06: 10000 [IU] via SUBCUTANEOUS
  Filled 2022-10-06: qty 1

## 2022-10-06 NOTE — Progress Notes (Signed)
Patient tolerated Retacrit injection with no complaints voiced.  Site clean and dry with no bruising or swelling noted.  No complaints of pain.  Discharged with vital signs stable and no signs or symptoms of distress noted.

## 2022-10-06 NOTE — Patient Instructions (Signed)
[facility]  Discharge Instructions: Thank you for choosing [facility] to provide your oncology and hematology care.  If you have a lab appointment with the Cancer Center - please note that after April 8th, 2024, all labs will be drawn in the cancer center.  You do not have to check in or register with the main entrance as you have in the past but will complete your check-in in the cancer center.  Wear comfortable clothing and clothing appropriate for easy access to any Portacath or PICC line.   We strive to give you quality time with your provider. You may need to reschedule your appointment if you arrive late (15 or more minutes).  Arriving late affects you and other patients whose appointments are after yours.  Also, if you miss three or more appointments without notifying the office, you may be dismissed from the clinic at the provider's discretion.      For prescription refill requests, have your pharmacy contact our office and allow 72 hours for refills to be completed.    Today you received the following Retacrit.  Epoetin Alfa Injection What is this medication? EPOETIN ALFA (e POE e tin AL fa) treats low levels of red blood cells (anemia) caused by kidney disease, chemotherapy, or HIV medications. It can also be used in people who are at risk for blood loss during surgery. It works by Systems analyst make more red blood cells, which reduces the need for blood transfusions. This medicine may be used for other purposes; ask your health care provider or pharmacist if you have questions. COMMON BRAND NAME(S): Epogen, Procrit, Retacrit What should I tell my care team before I take this medication? They need to know if you have any of these conditions: Blood clots Cancer Heart disease High blood pressure On dialysis Seizures Stroke An unusual or allergic reaction to epoetin alfa, albumin, benzyl alcohol, other medications, foods, dyes, or  preservatives Pregnant or trying to get pregnant Breast-feeding How should I use this medication? This medication is injected into a vein or under the skin. It is usually given by your care team in a hospital or clinic setting. It may also be given at home. If you get this medication at home, you will be taught how to prepare and give it. Use exactly as directed. Take it as directed on the prescription label at the same time every day. Keep taking it unless your care team tells you to stop. It is important that you put your used needles and syringes in a special sharps container. Do not put them in a trash can. If you do not have a sharps container, call your pharmacist or care team to get one. A special MedGuide will be given to you by the pharmacist with each prescription and refill. Be sure to read this information carefully each time. Talk to your care team about the use of this medication in children. While this medication may be used in children as young as 1 month of age for selected conditions, precautions do apply. Overdosage: If you think you have taken too much of this medicine contact a poison control center or emergency room at once. NOTE: This medicine is only for you. Do not share this medicine with others. What if I miss a dose? If you miss a dose, take it as soon as you can. If it is almost time for your next dose, take only that dose. Do not take double or  extra doses. What may interact with this medication? Darbepoetin alfa Methoxy polyethylene glycol-epoetin beta This list may not describe all possible interactions. Give your health care provider a list of all the medicines, herbs, non-prescription drugs, or dietary supplements you use. Also tell them if you smoke, drink alcohol, or use illegal drugs. Some items may interact with your medicine. What should I watch for while using this medication? Visit your care team for regular checks on your progress. Check your blood pressure  as directed. Know what your blood pressure should be and when to contact your care team. Your condition will be monitored carefully while you are receiving this medication. You may need blood work while taking this medication. What side effects may I notice from receiving this medication? Side effects that you should report to your care team as soon as possible: Allergic reactions--skin rash, itching, hives, swelling of the face, lips, tongue, or throat Blood clot--pain, swelling, or warmth in the leg, shortness of breath, chest pain Heart attack--pain or tightness in the chest, shoulders, arms, or jaw, nausea, shortness of breath, cold or clammy skin, feeling faint or lightheaded Increase in blood pressure Rash, fever, and swollen lymph nodes Redness, blistering, peeling, or loosening of the skin, including inside the mouth Seizures Stroke--sudden numbness or weakness of the face, arm, or leg, trouble speaking, confusion, trouble walking, loss of balance or coordination, dizziness, severe headache, change in vision Side effects that usually do not require medical attention (report to your care team if they continue or are bothersome): Bone, joint, or muscle pain Cough Headache Nausea Pain, redness, or irritation at injection site This list may not describe all possible side effects. Call your doctor for medical advice about side effects. You may report side effects to FDA at 1-800-FDA-1088. Where should I keep my medication? Keep out of the reach of children and pets. Store in a refrigerator. Do not freeze. Do not shake. Protect from light. Keep this medication in the original container until you are ready to take it. See product for storage information. Get rid of any unused medication after the expiration date. To get rid of medications that are no longer needed or have expired: Take the medication to a medication take-back program. Check with your pharmacy or law enforcement to find a  location. If you cannot return the medication, ask your pharmacist or care team how to get rid of the medication safely. NOTE: This sheet is a summary. It may not cover all possible information. If you have questions about this medicine, talk to your doctor, pharmacist, or health care provider.  2024 Elsevier/Gold Standard (2021-06-05 00:00:00)       To help prevent nausea and vomiting after your treatment, we encourage you to take your nausea medication as directed.  BELOW ARE SYMPTOMS THAT SHOULD BE REPORTED IMMEDIATELY: *FEVER GREATER THAN 100.4 F (38 C) OR HIGHER *CHILLS OR SWEATING *NAUSEA AND VOMITING THAT IS NOT CONTROLLED WITH YOUR NAUSEA MEDICATION *UNUSUAL SHORTNESS OF BREATH *UNUSUAL BRUISING OR BLEEDING *URINARY PROBLEMS (pain or burning when urinating, or frequent urination) *BOWEL PROBLEMS (unusual diarrhea, constipation, pain near the anus) TENDERNESS IN MOUTH AND THROAT WITH OR WITHOUT PRESENCE OF ULCERS (sore throat, sores in mouth, or a toothache) UNUSUAL RASH, SWELLING OR PAIN  UNUSUAL VAGINAL DISCHARGE OR ITCHING   Items with * indicate a potential emergency and should be followed up as soon as possible or go to the Emergency Department if any problems should occur.  Please show the CHEMOTHERAPY ALERT CARD  or IMMUNOTHERAPY ALERT CARD at check-in to the Emergency Department and triage nurse.  Should you have questions after your visit or need to cancel or reschedule your appointment, please contact [facility] AT [facility] [phone]  and follow the prompts.  Office hours are 8:00 a.m. to 4:30 p.m. Monday - Friday. Please note that voicemails left after 4:00 p.m. may not be returned until the following business day.  We are closed weekends and major holidays. You have access to a nurse at all times for urgent questions. Please call the main number to the clinic [phone] and follow the prompts.  For any non-urgent questions, you may also contact your  provider using MyChart. We now offer e-Visits for anyone 25 and older to request care online for non-urgent symptoms. For details visit [email or web address].   Also download the MyChart app! Go to the app store, search "MyChart", open the app, select [place], and log in with your MyChart username and password.

## 2022-10-20 ENCOUNTER — Inpatient Hospital Stay: Payer: Medicare Other | Attending: Hematology

## 2022-10-20 ENCOUNTER — Inpatient Hospital Stay: Payer: Medicare Other

## 2022-10-20 VITALS — BP 131/45 | HR 69 | Temp 97.6°F | Resp 17

## 2022-10-20 DIAGNOSIS — D631 Anemia in chronic kidney disease: Secondary | ICD-10-CM | POA: Insufficient documentation

## 2022-10-20 DIAGNOSIS — D509 Iron deficiency anemia, unspecified: Secondary | ICD-10-CM

## 2022-10-20 DIAGNOSIS — Z79899 Other long term (current) drug therapy: Secondary | ICD-10-CM | POA: Diagnosis not present

## 2022-10-20 DIAGNOSIS — K7581 Nonalcoholic steatohepatitis (NASH): Secondary | ICD-10-CM | POA: Insufficient documentation

## 2022-10-20 DIAGNOSIS — D500 Iron deficiency anemia secondary to blood loss (chronic): Secondary | ICD-10-CM

## 2022-10-20 DIAGNOSIS — D696 Thrombocytopenia, unspecified: Secondary | ICD-10-CM

## 2022-10-20 DIAGNOSIS — N1831 Chronic kidney disease, stage 3a: Secondary | ICD-10-CM | POA: Insufficient documentation

## 2022-10-20 DIAGNOSIS — R161 Splenomegaly, not elsewhere classified: Secondary | ICD-10-CM | POA: Diagnosis not present

## 2022-10-20 LAB — CBC
HCT: 30.9 % — ABNORMAL LOW (ref 36.0–46.0)
Hemoglobin: 9.9 g/dL — ABNORMAL LOW (ref 12.0–15.0)
MCH: 32.7 pg (ref 26.0–34.0)
MCHC: 32 g/dL (ref 30.0–36.0)
MCV: 102 fL — ABNORMAL HIGH (ref 80.0–100.0)
Platelets: 41 10*3/uL — ABNORMAL LOW (ref 150–400)
RBC: 3.03 MIL/uL — ABNORMAL LOW (ref 3.87–5.11)
RDW: 15.3 % (ref 11.5–15.5)
WBC: 1.9 10*3/uL — ABNORMAL LOW (ref 4.0–10.5)
nRBC: 0 % (ref 0.0–0.2)

## 2022-10-20 LAB — SAMPLE TO BLOOD BANK

## 2022-10-20 MED ORDER — EPOETIN ALFA-EPBX 10000 UNIT/ML IJ SOLN
10000.0000 [IU] | Freq: Once | INTRAMUSCULAR | Status: AC
  Administered 2022-10-20: 10000 [IU] via SUBCUTANEOUS
  Filled 2022-10-20: qty 1

## 2022-10-20 NOTE — Progress Notes (Signed)
Patient presents today for Retacrit injection per providers order.  Hgb 9.9.  Vital signs WNL.  Patient has no new complaints at this time.  Stable during administration without incident; injection site WNL; see MAR for injection details.  Patient tolerated procedure well and without incident.  No questions or complaints noted at this time.

## 2022-10-20 NOTE — Patient Instructions (Signed)
[facility] [place]  Discharge Instructions: Thank you for choosing [facility] to provide your oncology and hematology care.  If you have a lab appointment with the Cancer Center - please note that after April 8th, 2024, all labs will be drawn in the cancer center.  You do not have to check in or register with the main entrance as you have in the past but will complete your check-in in the cancer center.  Wear comfortable clothing and clothing appropriate for easy access to any Portacath or PICC line.   We strive to give you quality time with your provider. You may need to reschedule your appointment if you arrive late (15 or more minutes).  Arriving late affects you and other patients whose appointments are after yours.  Also, if you miss three or more appointments without notifying the office, you may be dismissed from the clinic at the provider's discretion.      For prescription refill requests, have your pharmacy contact our office and allow 72 hours for refills to be completed.    Today you received the following chemotherapy and/or immunotherapy agents Retacrit      To help prevent nausea and vomiting after your treatment, we encourage you to take your nausea medication as directed.  BELOW ARE SYMPTOMS THAT SHOULD BE REPORTED IMMEDIATELY: *FEVER GREATER THAN 100.4 F (38 C) OR HIGHER *CHILLS OR SWEATING *NAUSEA AND VOMITING THAT IS NOT CONTROLLED WITH YOUR NAUSEA MEDICATION *UNUSUAL SHORTNESS OF BREATH *UNUSUAL BRUISING OR BLEEDING *URINARY PROBLEMS (pain or burning when urinating, or frequent urination) *BOWEL PROBLEMS (unusual diarrhea, constipation, pain near the anus) TENDERNESS IN MOUTH AND THROAT WITH OR WITHOUT PRESENCE OF ULCERS (sore throat, sores in mouth, or a toothache) UNUSUAL RASH, SWELLING OR PAIN  UNUSUAL VAGINAL DISCHARGE OR ITCHING   Items with * indicate a potential emergency and should be followed up as soon as possible or go to the  Emergency Department if any problems should occur.  Please show the CHEMOTHERAPY ALERT CARD or IMMUNOTHERAPY ALERT CARD at check-in to the Emergency Department and triage nurse.  Should you have questions after your visit or need to cancel or reschedule your appointment, please contact [facility] [place] [phone]  and follow the prompts.  Office hours are 8:00 a.m. to 4:30 p.m. Monday - Friday. Please note that voicemails left after 4:00 p.m. may not be returned until the following business day.  We are closed weekends and major holidays. You have access to a nurse at all times for urgent questions. Please call the main number to the clinic [phone] and follow the prompts.  For any non-urgent questions, you may also contact your provider using MyChart. We now offer e-Visits for anyone 18 and older to request care online for non-urgent symptoms. For details visit [email or web address].   Also download the MyChart app! Go to the app store, search "MyChart", open the app, select , and log in with your MyChart username and password.   

## 2022-11-01 ENCOUNTER — Encounter (INDEPENDENT_AMBULATORY_CARE_PROVIDER_SITE_OTHER): Payer: Self-pay | Admitting: Gastroenterology

## 2022-11-01 ENCOUNTER — Ambulatory Visit (INDEPENDENT_AMBULATORY_CARE_PROVIDER_SITE_OTHER): Payer: Medicare Other | Admitting: Gastroenterology

## 2022-11-01 VITALS — BP 146/82 | HR 80 | Temp 98.0°F | Ht 63.0 in | Wt 139.8 lb

## 2022-11-01 DIAGNOSIS — K7581 Nonalcoholic steatohepatitis (NASH): Secondary | ICD-10-CM | POA: Diagnosis not present

## 2022-11-01 DIAGNOSIS — I8510 Secondary esophageal varices without bleeding: Secondary | ICD-10-CM

## 2022-11-01 DIAGNOSIS — K582 Mixed irritable bowel syndrome: Secondary | ICD-10-CM | POA: Diagnosis not present

## 2022-11-01 DIAGNOSIS — I700 Atherosclerosis of aorta: Secondary | ICD-10-CM | POA: Diagnosis not present

## 2022-11-01 DIAGNOSIS — K317 Polyp of stomach and duodenum: Secondary | ICD-10-CM

## 2022-11-01 DIAGNOSIS — H8110 Benign paroxysmal vertigo, unspecified ear: Secondary | ICD-10-CM | POA: Diagnosis not present

## 2022-11-01 DIAGNOSIS — Z299 Encounter for prophylactic measures, unspecified: Secondary | ICD-10-CM | POA: Diagnosis not present

## 2022-11-01 DIAGNOSIS — D509 Iron deficiency anemia, unspecified: Secondary | ICD-10-CM

## 2022-11-01 DIAGNOSIS — D72819 Decreased white blood cell count, unspecified: Secondary | ICD-10-CM | POA: Insufficient documentation

## 2022-11-01 DIAGNOSIS — K7460 Unspecified cirrhosis of liver: Secondary | ICD-10-CM | POA: Diagnosis not present

## 2022-11-01 DIAGNOSIS — G8194 Hemiplegia, unspecified affecting left nondominant side: Secondary | ICD-10-CM | POA: Diagnosis not present

## 2022-11-01 DIAGNOSIS — R42 Dizziness and giddiness: Secondary | ICD-10-CM | POA: Diagnosis not present

## 2022-11-01 DIAGNOSIS — I10 Essential (primary) hypertension: Secondary | ICD-10-CM | POA: Diagnosis not present

## 2022-11-01 DIAGNOSIS — D61818 Other pancytopenia: Secondary | ICD-10-CM | POA: Diagnosis not present

## 2022-11-01 MED ORDER — CARVEDILOL 6.25 MG PO TABS
6.2500 mg | ORAL_TABLET | Freq: Two times a day (BID) | ORAL | 3 refills | Status: DC
Start: 2022-11-01 — End: 2023-05-11

## 2022-11-01 NOTE — Progress Notes (Unsigned)
Jeanette Chang, M.D. Gastroenterology & Hepatology [facility] [address] [place], [place] [address]  Primary Care Physician: Kirstie Peri, MD [address] [place] [address]  I will communicate my assessment and recommendations to the referring MD via EMR.  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   The patient was last seen on 04/26/2022. At that time, the patient was advised to restart carvedilol 3.125 mg twice a day and to stop lisinopril.  She was also advised to take Imodium as needed for diarrhea.  Patient reports that she has had some issues with vertigo recently. She feels she is spinning around, and will be seeing Dr. Sherryll Burger soon for this.  She has not presented frequent abdominal pain, but this has improved recently as she rarely has pain. She has 2-3 bowel movements per day, which improves with the use of Imodium. The patient denies having any nausea, vomiting, fever, chills, hematochezia, melena, hematemesis, abdominal distention, abdominal pain, diarrhea, jaundice, pruritus . She is staying around 140 lb, may take some Lasix when she gets swollen and increases her weight.  The patient has been seeing hematology, last appointment was on 09/08/2022.  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  Most recent hemoglobin on 10/20/2022 was 9.9 with MCV of 102. WBC was 3.03 and PLT 41k.  Cirrhosis related questions: Hematemesis/coffee ground emesis: No Abdominal pain: No Abdominal distention/worsening ascites: No Fever/chills: No Episodes of confusion/disorientation: No Taking  diuretics?: Yes, Lasix 20 mg as needed.  History of variceal bleeding: No Prior history of banding?: No, but had grade 2 esophageal varices on treatment with beta-blockers in the past Prior episodes of SBP: No Last time liver imaging was performed:04/27/22 [place] - no masses Last AFP: 3/24 - 2.3 MELD score: 04/2022  - 8  Last EGD:06/17/2020 Grade II varices were found in the lower third of the esophagus. An area of irregularity was found in the posterior rim of the pyloric channed. This was visualized with the aid of a transparent cap. I pulled the area with a cold forceps and was able to see a medium-sized - 1 cm, frond-like/villous, non-circumferential mass with no bleeding and no stigmata of recent bleeding was found at the pylorus. It had some adenomatous features - unclear if coming from pylorus or from duodenal bulb. One biopsy was taken with a cold forceps for histology, no more biopsies were performed as patient presented significant bleeding that stopped on its own. The examined duodenum and ampulla were normal.   Path: Granulation tissue neg for malignancy or HP.   Last Colonoscopy:06/2019 - two small cecal and SF polyps, one cecal 6 mm polyp. All polyps were TA. Hemorrhoids  Past Medical History: Past Medical History:  Diagnosis Date   Abnormal LFTs    Achilles bursitis or tendinitis    Acute maxillary sinusitis    Acute pharyngitis    Anemia, unspecified    Candidiasis of skin and nails    Dermatophytosis of foot    Dermatophytosis of the body    Diverticulitis of colon (without mention of hemorrhage)(562.11)    Edema    Headache(784.0)    HOH (hard of hearing)  Malaise and fatigue    Occlusion and stenosis of carotid artery without mention of cerebral infarction    Osteoarthrosis, unspecified whether generalized or localized, unspecified site    Other dyspnea and respiratory abnormality    Other psoriasis    Other specified disorders of rotator cuff syndrome of shoulder and  allied disorders    Pain in joint    Pure hypercholesterolemia    RUQ pain    Spasm of back muscles    Thrombocytopenia, unspecified (HCC)    Type II or unspecified type diabetes mellitus without mention of complication, not stated as uncontrolled    Unspecified essential hypertension    Unspecified sinusitis (chronic)    Urinary incontinence     Past Surgical History: Past Surgical History:  Procedure Laterality Date   APPENDECTOMY  1964   BIOPSY  03/02/2017   Procedure: BIOPSY;  Surgeon: Malissa Hippo, MD;  [facility];  Service: Endoscopy;;  antral   BIOPSY  06/17/2020   Procedure: BIOPSY;  Surgeon: Dolores Frame, MD;  [facility];  Service: Gastroenterology;;   CAROTID ENDARTERECTOMY  2005   Left CEA   CAROTID ENDARTERECTOMY  2009   Right CEA   CATARACT EXTRACTION, BILATERAL Bilateral    CHOLECYSTECTOMY  1982   Gall Bladder   COLONOSCOPY  09   COLONOSCOPY N/A 06/27/2019   Procedure: COLONOSCOPY;  Surgeon: Malissa Hippo, MD;  [facility];  Service: Endoscopy;  Laterality: N/A;   ESOPHAGOGASTRODUODENOSCOPY N/A 03/02/2017   Procedure: ESOPHAGOGASTRODUODENOSCOPY (EGD);  Surgeon: Malissa Hippo, MD;  [facility];  Service: Endoscopy;  Laterality: N/A;  12:55   ESOPHAGOGASTRODUODENOSCOPY N/A 06/27/2019   Procedure: ESOPHAGOGASTRODUODENOSCOPY (EGD);  Surgeon: Malissa Hippo, MD;  [facility];  Service: Endoscopy;  Laterality: N/A;  730   ESOPHAGOGASTRODUODENOSCOPY (EGD) WITH PROPOFOL N/A 06/17/2020   Procedure: ESOPHAGOGASTRODUODENOSCOPY (EGD) WITH PROPOFOL;  Surgeon: Dolores Frame, MD;  [facility];  Service: Gastroenterology;  Laterality: N/A;  AM   GIVENS CAPSULE STUDY N/A 10/24/2019   Procedure: GIVENS CAPSULE STUDY;  Surgeon: Malissa Hippo, MD;  [facility];  Service: Endoscopy;  Laterality: N/A;  730   KNEE ARTHROSCOPY WITH MEDIAL MENISECTOMY Left 07/10/2019    Procedure: KNEE ARTHROSCOPY WITH MEDIAL MENISCECTOMY;  Surgeon: Vickki Hearing, MD;  Location: [facility];  Service: Orthopedics;  Laterality: Left;   KNEE ARTHROSCOPY WITH MEDIAL MENISECTOMY Left 03/14/2020   Procedure: KNEE ARTHROSCOPY WITH MEDIAL MENISCECTOMY;  Surgeon: Vickki Hearing, MD;  Location: [facility];  Service: Orthopedics;  Laterality: Left;   NOSE SURGERY  1978   PARATHYROIDECTOMY  1992   POLYPECTOMY  03/02/2017   Procedure: POLYPECTOMY;  Surgeon: Malissa Hippo, MD;  [facility];  Service: Endoscopy;;  gastric    POLYPECTOMY  06/27/2019   Procedure: POLYPECTOMY;  Surgeon: Malissa Hippo, MD;  [facility];  Service: Endoscopy;;  colon   Power port  03/06/2009   Right upper chest    TONSILLECTOMY     TUMOR EXCISION Right August 24, 2013   [facility] Dr. Haynes Hoehn, ENT   VAGINAL HYSTERECTOMY  1972    Family History: Family History  Problem Relation Age of Onset   Heart attack Mother    Cancer Mother    Heart attack Father    Cancer Sister     Social History: Social History   Tobacco Use  Smoking Status Former  Current packs/day: 0.00   Average packs/day: 1 pack/day for 42.0 years (42.0 ttl pk-yrs)   Types: Cigarettes   Start date: 03/12/1950   Quit date: 03/12/1992   Years since quitting: 30.6   Passive exposure: Past  Smokeless Tobacco Former   Quit date: 12/16/1992   Social History   Substance and Sexual Activity  Alcohol Use Yes   Alcohol/week: 1.0 standard drink of alcohol   Types: 1 Glasses of wine per week   Comment: very seldom   Social History   Substance and Sexual Activity  Drug Use No    Allergies: Allergies  Allergen Reactions   Codeine Nausea And Vomiting and Rash   [place]ascus Purpureus Went Yeast Other (See Comments)    Headaches, myalgias   Niacin And Related Other (See Comments)    Headaches, myalgias   Red Yeast Rice Other (See Comments)    Headaches, myalgias   Actos  [Pioglitazone Hydrochloride] Other (See Comments)    Severe headache   Amaryl Nausea Only and Other (See Comments)    Severe headache (patient is tolerating 2 mg dosing)   Iron Diarrhea   Sanctura [Trospium Chloride] Nausea Only and Other (See Comments)    Severe headache   Statins Other (See Comments)    Severe headache,  Hips and leg weakness that cause patient not to be able to function as per patient.   Toviaz [Fesoterodine Fumarate] Other (See Comments)    Cramps, severe headache   Norco [Hydrocodone-Acetaminophen] Nausea And Vomiting and Anxiety   Penicillins Swelling, Rash and Other (See Comments)    Has patient had a PCN reaction causing immediate rash, facial/tongue/throat swelling, SOB or lightheadedness with hypotension: No Has patient had a PCN reaction causing severe rash involving mucus membranes or skin necrosis: No Has patient had a PCN reaction that required hospitalization: Yes Has patient had a PCN reaction occurring within the last 10 years: No If all of the above answers are "NO", then may proceed with Cephalosporin use.    Ultram [Tramadol Hcl] Nausea And Vomiting    Medications: Current Outpatient Medications  Medication Sig Dispense Refill   acetaminophen (TYLENOL) 500 MG tablet Take 1 tablet (500 mg total) by mouth every 6 (six) hours as needed for moderate pain or headache. 30 tablet 0   aspirin EC 81 MG tablet Take 81 mg by mouth every other day. AT NIGHT.     B-D UF III MINI PEN NEEDLES 31G X 5 MM MISC SMARTSIG:1 Pen Needle SUB-Q Daily     Biotin 10 MG CAPS Take by mouth daily at 6 (six) AM.     carvedilol (COREG) 3.125 MG tablet Take 3.125 mg by mouth 2 (two) times daily with a meal.     cholecalciferol (VITAMIN D) 25 MCG (1000 UNIT) tablet Take 1,000 Units by mouth daily.     dicyclomine (BENTYL) 10 MG capsule TAKE 1 CAPSULE (10 MG TOTAL) BY MOUTH 3 (THREE) TIMES DAILY BEFORE MEALS. 270 capsule 0   furosemide (LASIX) 20 MG tablet Take 20 mg by mouth as  needed.     gabapentin (NEURONTIN) 300 MG capsule Take 300 mg by mouth at bedtime.     losartan (COZAAR) 25 MG tablet Take 25 mg by mouth daily.     metFORMIN (GLUCOPHAGE) 500 MG tablet Take 500 mg by mouth 2 (two) times daily with a meal.     Multiple Vitamins-Minerals (EYE VITAMINS PO) Take by mouth. Red-2 for eyes     ONETOUCH ULTRA test strip  1 (ONE) STRIP DAILY,DM2,E11.65     OVER THE COUNTER MEDICATION Iron as needed. Not daily     OVER THE COUNTER MEDICATION Compounded Hemorrhoid cream [facility]) Apply rectally up to four times per day.     TRESIBA FLEXTOUCH 100 UNIT/ML FlexTouch Pen SMARTSIG:10 Unit(s) SUB-Q Daily     No current facility-administered medications for this visit.   Facility-Administered Medications Ordered in Other Visits  Medication Dose Route Frequency Provider Last Rate Last Admin   sodium chloride irrigation 0.9 %    PRN Vickki Hearing, MD   1,000 mL at 03/14/20 0945    Review of Systems: GENERAL: negative for malaise, night sweats HEENT: No changes in hearing or vision, no nose bleeds or other nasal problems. NECK: Negative for lumps, goiter, pain and significant neck swelling RESPIRATORY: Negative for cough, wheezing CARDIOVASCULAR: Negative for chest pain, leg swelling, palpitations, orthopnea GI: SEE HPI MUSCULOSKELETAL: Negative for joint pain or swelling, back pain, and muscle pain. SKIN: Negative for lesions, rash PSYCH: Negative for sleep disturbance, mood disorder and recent psychosocial stressors. HEMATOLOGY Negative for prolonged bleeding, bruising easily, and swollen nodes. ENDOCRINE: Negative for cold or heat intolerance, polyuria, polydipsia and goiter. NEURO: negative for tremor, gait imbalance, syncope and seizures. The remainder of the review of systems is noncontributory.   Physical Exam: BP (!) 146/82 (BP Location: Left Arm, Patient Position: Sitting)   Pulse 80   Temp 98 F (36.7 C)   Ht 5\' 3"  (1.6 m)   Wt 139 lb  12.8 oz (63.4 kg)   BMI 24.76 kg/m  GENERAL: The patient is AO x3, in no acute distress. HEENT: Head is normocephalic and atraumatic. EOMI are intact. Mouth is well hydrated and without lesions. NECK: Supple. No masses LUNGS: Clear to auscultation. No presence of rhonchi/wheezing/rales. Adequate chest expansion HEART: RRR, normal s1 and s2. ABDOMEN: Soft, nontender, no guarding, no peritoneal signs, and nondistended. BS +. No masses. RECTAL EXAM: no external lesions, normal tone, no masses, brown stool without blood.*** Chaperone: EXTREMITIES: Without any cyanosis, clubbing, rash, lesions or edema. NEUROLOGIC: AOx3, no focal motor deficit. SKIN: no jaundice, no rashes  Imaging/Labs: as above  I personally reviewed and interpreted the available labs, imaging and endoscopic files.  Impression and Plan: Jeanette Chang is a 82 y.o. female coming for follow up of ***   All questions were answered.      Jeanette Blazing, MD Gastroenterology and Hepatology [facility]

## 2022-11-01 NOTE — Patient Instructions (Signed)
-   Continue Imodium as needed for diarrhea - Increase carvedilol 6.25 mg BID - Check CBC, MELD labs and AFP - proceed with liver US - Reduce salt intake to <2 g per day - Can take Tylenol max of 2 g per day (650 mg q8h) for pain - Avoid NSAIDs for pain - Avoid eating raw oysters/shellfish - Protein shake (Ensure or Boost) every night before going to sleep - Follow up with hematology for pancytopenia/iron deficiency anemia

## 2022-11-02 NOTE — Progress Notes (Unsigned)
Jeanette [facility] [address], [place] [address]   CLINIC:  Medical Oncology/Hematology  PCP:  Kirstie Peri, MD [address] [place] [address] [phone]   REASON FOR VISIT:  Follow-up for pancytopenia  PRIOR THERAPY: Blood transfusions  CURRENT THERAPY: Intermittent IV iron + Retacrit  INTERVAL HISTORY:   Jeanette Chang 82 y.o. 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  She was last seen by Rojelio Brenner PA-C on 09/08/2022.  At today's visit, she reports feeling fair, apart from a flare-up of her vertigo and headaches this week.  She has not had any hospitalizations or changes in her baseline health since her last visit.  She continues to tolerate her Retacrit injections reasonably well, but does report some increased bone pain in her legs after injections.  She denies any symptoms concerning for DVT/PE, and her blood pressure today is 137/47.  She has ongoing fatigue, ice pica, and leg cramping.   She has shortness of breath on exertion, headaches, and some lightheadedness with standing.   No chest pain or syncopal episodes.  She has not had any recent infections, B symptoms, or lymphadenopathy.  She has not noticed any recent bleeding (last episode of rectal bleeding was in early June).  She had epistaxis x 2 this month, with moderately heavy bleeding that lasted less than 5 minutes.  No melena or hematemesis.  She continues to bruise easily in her extremities, as well as ongoing abdominal bruising at the locations of her insulin injections.  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 Levon Hedger.  She has 50% energy and 100% appetite. She endorses that she is maintaining a stable weight.  ASSESSMENT & PLAN:  1.   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  Normal duodenum. - Colonoscopy (06/29/2019): Perianal skin tags found on perianal exam.  2 small polyps at the splenic flexure.  External hemorrhoids. - BMBX (09/09/2020): Normocellular marrow (20%) with TLH, including minimal dyserythropoiesis and 1% blasts by manual aspirate differential.  Mild dyserythropoiesis which does not fulfill criteria for MDS.  Karyotype 45, X, -X[4]/46, XX[16], loss of one X chromosome is rare finding in the bone marrow is of older woman and when present as a minor clone (less than 50% of 20 metaphases) is thought to be most likely aging effect. - Labs from December 2023 showed normal B12, folate, copper, SPEP.  CKD stage II/IIIa based on creatinine (CKD stage IIIb based on elevated Cystatin C 1.78) - History of PRBC transfusions x 2 in October 2023 - Most recent IV iron with Feraheme x 2 in August 2024.   - Started on Retacrit 08/11/2022 - current dose 10,000 units every 2 weeks - Labs today (11/03/2022): Hgb 10.1/MCV 100.0.  Ferritin 116, iron saturation 21%.  Creatinine 1.35/GFR 39. - Intermittent hematochezia thought to be from bleeding hemorrhoids.   No melena. - PLAN: Continue Retacrit 10,000 units every 2 weeks in the setting of anemia of CKD as well as dyserythropoiesis noted on BMBX from July 2022. - Indication for IV iron at this time - CBC/differential + BB sample every 2 weeks - Recheck CBC/D, CMP, and iron panel with RTC in 3 months - If any severe derangements beyond baseline cytopenias, would consider repeat bone marrow  biopsy  2.  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  Normal duodenum. - Colonoscopy (06/29/2019): Perianal skin tags found on perianal exam.  2 small polyps at the splenic flexure.  External hemorrhoids. - BMBX (09/09/2020): Normocellular marrow (20%) with  TLH, including minimal dyserythropoiesis and 1% blasts by manual aspirate differential.  Mild dyserythropoiesis which does not fulfill criteria for MDS.  Karyotype 45, X, -X[4]/46, XX[16], loss of 1 X chromosome is rare finding in the bone marrow is of older woman and when present as a minor clone (less than 50% of 20 metaphases) is thought to be most likely aging effect. - CT [facility] (04/24/2021): Spleen enlarged measuring 14.1 cm with volume 632 mL.  Liver has nodular contour consistent with cirrhosis. - Labs from December 2023 showed normal B12 of, folate, copper, SPEP.  CKD stage IIIa/b. - No B symptoms or recent infections. She has easy bruising.  Reports epistaxis x2 this month - Most recent labs (11/03/2022): WBC 2.0/ANC 1.0, platelets 44 - PLAN: We will continue active surveillance.  Blood counts overall at baseline. - If any severe derangements beyond baseline cytopenias, would consider repeat bone marrow biopsy  3.  Easy bruising - Per patient report, PCP had concerns regarding her easy bruising - Additional labs were checked on 08/04/2022, which showed normal fibrinogen, normal APTT, and normal INR.  Von Willebrand panel was not consistent with diagnosis of von Willebrand's disease. 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  4.  Social/family history: - Other PMH includes NASH cirrhosis, diabetes, arthritis, carotid artery disease her daughter lives with her at home.  She is independent of ADLs and IADLs.  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  Quit smoking in November 1992. - Maternal grandmother had low platelets.  Sister had low platelet. - Mother had colon cancer.  Sister had stomach cancer.  Paternal uncle had lung cancer.   PLAN SUMMARY:  >> CBC/BB sample + Retacrit every 2 weeks >> Labs in 3 months = CBC/D, CMP, ferritin, iron/TIBC >> OFFICE visit in 3 months (2 weeks after labs)     REVIEW OF SYSTEMS:  Review of  Systems  Constitutional:  Positive for fatigue. Negative for appetite change, chills, diaphoresis, fever and unexpected weight change.  HENT:   Negative for lump/mass and nosebleeds.   Eyes:  Negative for eye problems.  Respiratory:  Positive for shortness of breath (with exertion). Negative for cough and hemoptysis.   Cardiovascular:  Negative for chest pain, leg swelling and palpitations.  Gastrointestinal:  Positive for nausea. Negative for abdominal pain, blood in stool, constipation, diarrhea and vomiting.  Genitourinary:  Negative for hematuria.   Musculoskeletal:  Positive for arthralgias.  Skin:  Negative for itching.  Neurological:  Positive for dizziness (vertigo) and headaches. Negative for light-headedness and numbness.  Hematological:  Bruises/bleeds easily.  Psychiatric/Behavioral:  Positive for sleep disturbance. Negative for depression. The patient is not nervous/anxious.      PHYSICAL EXAM:  ECOG PERFORMANCE STATUS: 1 - Symptomatic but completely ambulatory  Vitals:   11/03/22 1429  BP: (!) 137/47  Pulse: 70  Resp: 18  Temp: 98.1 F (36.7 C)  SpO2: 98%    Filed Weights   11/03/22 1429  Weight: 141 lb 11.2 oz (64.3 kg)    Physical Exam Constitutional:      Appearance: Normal appearance. She is normal weight.  Comments: Weak-appearing  Cardiovascular:     Heart sounds: Normal heart sounds.  Pulmonary:     Breath sounds: Normal breath sounds.  Skin:    Findings: Bruising present.  Neurological:     General: No focal deficit present.     Mental Status: Mental status is at baseline.  Psychiatric:        Behavior: Behavior normal. Behavior is cooperative.    PAST MEDICAL/SURGICAL HISTORY:  Past Medical History:  Diagnosis Date   Abnormal LFTs    Achilles bursitis or tendinitis    Acute maxillary sinusitis    Acute pharyngitis    Anemia, unspecified    Candidiasis of skin and nails    Dermatophytosis of foot    Dermatophytosis of the body     Diverticulitis of colon (without mention of hemorrhage)(562.11)    Edema    Headache(784.0)    HOH (hard of hearing)    Malaise and fatigue    Occlusion and stenosis of carotid artery without mention of cerebral infarction    Osteoarthrosis, unspecified whether generalized or localized, unspecified site    Other dyspnea and respiratory abnormality    Other psoriasis    Other specified disorders of rotator cuff syndrome of shoulder and allied disorders    Pain in joint    Pure hypercholesterolemia    RUQ pain    Spasm of back muscles    Thrombocytopenia, unspecified (HCC)    Type II or unspecified type diabetes mellitus without mention of complication, not stated as uncontrolled    Unspecified essential hypertension    Unspecified sinusitis (chronic)    Urinary incontinence    Past Surgical History:  Procedure Laterality Date   APPENDECTOMY  1964   BIOPSY  03/02/2017   Procedure: BIOPSY;  Surgeon: Malissa Hippo, MD;  [facility];  Service: Endoscopy;;  antral   BIOPSY  06/17/2020   Procedure: BIOPSY;  Surgeon: Dolores Frame, MD;  [facility];  Service: Gastroenterology;;   CAROTID ENDARTERECTOMY  2005   Left CEA   CAROTID ENDARTERECTOMY  2009   Right CEA   CATARACT EXTRACTION, BILATERAL Bilateral    CHOLECYSTECTOMY  1982   Gall Bladder   COLONOSCOPY  09   COLONOSCOPY N/A 06/27/2019   Procedure: COLONOSCOPY;  Surgeon: Malissa Hippo, MD;  [facility];  Service: Endoscopy;  Laterality: N/A;   ESOPHAGOGASTRODUODENOSCOPY N/A 03/02/2017   Procedure: ESOPHAGOGASTRODUODENOSCOPY (EGD);  Surgeon: Malissa Hippo, MD;  [facility];  Service: Endoscopy;  Laterality: N/A;  12:55   ESOPHAGOGASTRODUODENOSCOPY N/A 06/27/2019   Procedure: ESOPHAGOGASTRODUODENOSCOPY (EGD);  Surgeon: Malissa Hippo, MD;  [facility];  Service: Endoscopy;  Laterality: N/A;  730   ESOPHAGOGASTRODUODENOSCOPY (EGD) WITH PROPOFOL N/A 06/17/2020    Procedure: ESOPHAGOGASTRODUODENOSCOPY (EGD) WITH PROPOFOL;  Surgeon: Dolores Frame, MD;  [facility];  Service: Gastroenterology;  Laterality: N/A;  AM   GIVENS CAPSULE STUDY N/A 10/24/2019   Procedure: GIVENS CAPSULE STUDY;  Surgeon: Malissa Hippo, MD;  [facility];  Service: Endoscopy;  Laterality: N/A;  730   KNEE ARTHROSCOPY WITH MEDIAL MENISECTOMY Left 07/10/2019   Procedure: KNEE ARTHROSCOPY WITH MEDIAL MENISCECTOMY;  Surgeon: Vickki Hearing, MD;  [facility]: [facility];  Service: Orthopedics;  Laterality: Left;   KNEE ARTHROSCOPY WITH MEDIAL MENISECTOMY Left 03/14/2020   Procedure: KNEE ARTHROSCOPY WITH MEDIAL MENISCECTOMY;  Surgeon: Vickki Hearing, MD;  [facility];  Service: Orthopedics;  Laterality: Left;  NOSE SURGERY  1978   PARATHYROIDECTOMY  1992   POLYPECTOMY  03/02/2017   Procedure: POLYPECTOMY;  Surgeon: Malissa Hippo, MD;  [facility];  Service: Endoscopy;;  gastric    POLYPECTOMY  06/27/2019   Procedure: POLYPECTOMY;  Surgeon: Malissa Hippo, MD;  [facility];  Service: Endoscopy;;  colon   Power port  03/06/2009   Right upper chest    TONSILLECTOMY     TUMOR EXCISION Right August 24, 2013   [facility] Dr[facility] Haynes Hoehn, ENT   VAGINAL HYSTERECTOMY  [phone]    SOCIAL HISTORY:  Social History   Socioeconomic History   Marital status: Widowed    Spouse name: Not on file   Number of children: Not on file   Years of education: Not on file   Highest education level: Not on file  Occupational History   Not on file  Tobacco Use   Smoking status: Former    Current packs/day: 0.00    Average packs/day: 1 pack/day for 42.0 years (42.0 ttl pk-yrs)    Types: Cigarettes    Start date: 03/12/1950    Quit date: 03/12/1992    Years since quitting: 30.6    Passive exposure: Past   Smokeless tobacco: Former    Quit date: 12/16/1992  Vaping Use   Vaping status: Never Used  Substance and  Sexual Activity   Alcohol use: Yes    Alcohol/week: 1.0 standard drink of alcohol    Types: 1 Glasses of wine per week    Comment: very seldom   Drug use: No   Sexual activity: Not Currently  Other Topics Concern   Not on file  Social History Narrative   Not on file   Social Determinants of Health   Financial Resource Strain: Low Risk  (04/07/2021)   Received from [facility], [facility]   Overall Financial Resource Strain (CARDIA)    Difficulty of Paying Living Expenses: Not hard at all  Food Insecurity: No Food Insecurity (04/07/2021)   Received from [facility], [facility]   Hunger Vital Sign    Worried About Running Out of Food in the Last Year: Never true    Ran Out of Food in the Last Year: Never true  Transportation Needs: No Transportation Needs (04/07/2021)   Received from [facility], [facility]   [facility] - Transportation    Lack of Transportation (Medical): No    Lack of Transportation (Non-Medical): No  Physical Activity: Inactive (04/07/2021)   Received from [facility], [facility]   Exercise Vital Sign    Days of Exercise per Week: 0 days    Minutes of Exercise per Session: 0 min  Stress: No Stress Concern Present (04/07/2021)   Received from [facility], [facility] - Occupational Stress Questionnaire    Feeling of Stress : Not at all  Social Connections: Moderately Isolated (04/07/2021)   Received from [facility], [facility]   Social Connection and Isolation Panel [NHANES]    Frequency of Communication with Friends and Family: More than three times a week    Frequency of Social Gatherings with Friends and Family: Twice a week    Attends Religious Services: Never    Database administrator or Organizations: No    Attends Banker Meetings: Never    Marital Status: Married  Catering manager Violence:  Not At Risk (04/07/2021)   Received from [facility],  [facility]   Humiliation, Afraid, Rape, and Kick questionnaire    Fear of Current or Ex-Partner: No    Emotionally Abused: No    Physically Abused: No    Sexually Abused: No    FAMILY HISTORY:  Family History  Problem Relation Age of Onset   Heart attack Mother    Cancer Mother    Heart attack Father    Cancer Sister     CURRENT MEDICATIONS:  Outpatient Encounter Medications as of 11/03/2022  Medication Sig Note   acetaminophen (TYLENOL) 500 MG tablet Take 1 tablet (500 mg total) by mouth every 6 (six) hours as needed for moderate pain or headache.    aspirin EC 81 MG tablet Take 81 mg by mouth every other day. AT NIGHT.    B-D UF III MINI PEN NEEDLES 31G X 5 MM MISC SMARTSIG:1 Pen Needle SUB-Q Daily    Biotin 10 MG CAPS Take by mouth daily at 6 (six) AM.    carvedilol (COREG) 6.25 MG tablet Take 1 tablet (6.25 mg total) by mouth 2 (two) times daily with a meal.    cholecalciferol (VITAMIN D) 25 MCG (1000 UNIT) tablet Take 1,000 Units by mouth daily.    dicyclomine (BENTYL) 10 MG capsule TAKE 1 CAPSULE (10 MG TOTAL) BY MOUTH 3 (THREE) TIMES DAILY BEFORE MEALS.    furosemide (LASIX) 20 MG tablet Take 20 mg by mouth as needed.    gabapentin (NEURONTIN) 300 MG capsule Take 300 mg by mouth at bedtime.    losartan (COZAAR) 25 MG tablet Take 25 mg by mouth daily.    meclizine (ANTIVERT) 25 MG tablet Take 25 mg by mouth 3 (three) times daily as needed.    metFORMIN (GLUCOPHAGE) 500 MG tablet Take 500 mg by mouth 2 (two) times daily with a meal.    Multiple Vitamins-Minerals (EYE VITAMINS PO) Take by mouth. Red-2 for eyes    ONETOUCH ULTRA test strip 1 (ONE) STRIP DAILY,DM2,E11.65    OVER THE COUNTER MEDICATION Iron as needed. Not daily    OVER THE COUNTER MEDICATION Compounded Hemorrhoid cream [facility]) Apply rectally up to four times per day. 04/26/2022: prn   TRESIBA FLEXTOUCH 100 UNIT/ML FlexTouch Pen SMARTSIG:10 Unit(s) SUB-Q Daily    [DISCONTINUED] carvedilol (COREG)  3.125 MG tablet Take 3.125 mg by mouth 2 (two) times daily with a meal.    Facility-Administered Encounter Medications as of 11/03/2022  Medication   sodium chloride irrigation 0.9 %    ALLERGIES:  Allergies  Allergen Reactions   Codeine Nausea And Vomiting and Rash   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   Toviaz [Fesoterodine Fumarate] Other (See Comments)    Cramps, severe headache   Norco [Hydrocodone-Acetaminophen] Nausea And Vomiting and Anxiety   Penicillins  Swelling, Rash and Other (See Comments)    Has patient had a PCN reaction causing immediate rash, facial/tongue/throat swelling, SOB or lightheadedness with hypotension: No Has patient had a PCN reaction causing severe rash involving mucus membranes or skin necrosis: No Has patient had a PCN reaction that required hospitalization: Yes Has patient had a PCN reaction occurring within the last 10 years: No If all of the above answers are "NO", then may proceed with Cephalosporin use.    Ultram [Tramadol Hcl] Nausea And Vomiting    LABORATORY DATA:  I have reviewed the labs as listed.  CBC    Component Value Date/Time   WBC 2.0 (L) 11/03/2022 1334   RBC 3.10 (L) 11/03/2022 1334   HGB 10.1 (L) 11/03/2022 1334   HCT 31.0 (L) 11/03/2022 1334   PLT 44 (L) 11/03/2022 1334   MCV 100.0 11/03/2022 1334   MCH 32.6 11/03/2022 1334   MCHC 32.6  11/03/2022 1334   RDW 14.6 11/03/2022 1334   LYMPHSABS 0.8 11/03/2022 1334   MONOABS 0.1 11/03/2022 1334   EOSABS 0.1 11/03/2022 1334   BASOSABS 0.0 11/03/2022 1334      Latest Ref Rng & Units 11/03/2022    1:34 PM 09/08/2022    9:17 AM 04/26/2022    1:27 PM  CMP  Glucose 70 - 99 mg/dL 161  096  045   BUN 8 - 23 mg/dL 43  31  24   Creatinine 0.44 - 1.00 mg/dL 4.09  8.11  9.14   Sodium 135 - 145 mmol/L 141  138  144   Potassium 3.5 - 5.1 mmol/L 4.4  4.5  4.2   Chloride 98 - 111 mmol/L 106  106  107   CO2 22 - 32 mmol/L 24  25  26    Calcium 8.9 - 10.3 mg/dL 8.9  9.1  9.5   Total Protein 6.5 - 8.1 g/dL 6.8  6.9  6.6   Total Bilirubin 0.3 - 1.2 mg/dL 0.7  0.7  0.5   Alkaline Phos 38 - 126 U/L 97  102    AST 15 - 41 U/L 31  42  36   ALT 0 - 44 U/L 27  40  30     DIAGNOSTIC IMAGING:  I have independently reviewed the relevant imaging and discussed with the patient.   WRAP UP:  All questions were answered. The patient knows to call the clinic with any problems, questions or concerns.  Medical decision making: Moderate  Time spent on visit: I spent 20 minutes counseling the patient face to face. The total time spent in the appointment was 30 minutes and more than 50% was on counseling.  Carnella Guadalajara, PA-C  11/03/22 3:27 PM

## 2022-11-03 ENCOUNTER — Other Ambulatory Visit: Payer: Self-pay | Admitting: *Deleted

## 2022-11-03 ENCOUNTER — Inpatient Hospital Stay (HOSPITAL_BASED_OUTPATIENT_CLINIC_OR_DEPARTMENT_OTHER): Payer: Medicare Other | Admitting: Physician Assistant

## 2022-11-03 ENCOUNTER — Inpatient Hospital Stay: Payer: Medicare Other

## 2022-11-03 VITALS — BP 137/47 | HR 70 | Temp 98.1°F | Resp 18 | Ht 63.0 in | Wt 141.7 lb

## 2022-11-03 DIAGNOSIS — D509 Iron deficiency anemia, unspecified: Secondary | ICD-10-CM

## 2022-11-03 DIAGNOSIS — D696 Thrombocytopenia, unspecified: Secondary | ICD-10-CM

## 2022-11-03 DIAGNOSIS — D631 Anemia in chronic kidney disease: Secondary | ICD-10-CM | POA: Diagnosis not present

## 2022-11-03 DIAGNOSIS — N1832 Chronic kidney disease, stage 3b: Secondary | ICD-10-CM

## 2022-11-03 DIAGNOSIS — K7460 Unspecified cirrhosis of liver: Secondary | ICD-10-CM

## 2022-11-03 DIAGNOSIS — K7581 Nonalcoholic steatohepatitis (NASH): Secondary | ICD-10-CM | POA: Diagnosis not present

## 2022-11-03 DIAGNOSIS — D72818 Other decreased white blood cell count: Secondary | ICD-10-CM

## 2022-11-03 DIAGNOSIS — Z79899 Other long term (current) drug therapy: Secondary | ICD-10-CM | POA: Diagnosis not present

## 2022-11-03 DIAGNOSIS — N1831 Chronic kidney disease, stage 3a: Secondary | ICD-10-CM | POA: Diagnosis not present

## 2022-11-03 DIAGNOSIS — R161 Splenomegaly, not elsewhere classified: Secondary | ICD-10-CM | POA: Diagnosis not present

## 2022-11-03 DIAGNOSIS — D500 Iron deficiency anemia secondary to blood loss (chronic): Secondary | ICD-10-CM

## 2022-11-03 LAB — CBC WITH DIFFERENTIAL/PLATELET
Abs Immature Granulocytes: 0 10*3/uL (ref 0.00–0.07)
Basophils Absolute: 0 10*3/uL (ref 0.0–0.1)
Basophils Relative: 1 %
Eosinophils Absolute: 0.1 10*3/uL (ref 0.0–0.5)
Eosinophils Relative: 3 %
HCT: 31 % — ABNORMAL LOW (ref 36.0–46.0)
Hemoglobin: 10.1 g/dL — ABNORMAL LOW (ref 12.0–15.0)
Immature Granulocytes: 0 %
Lymphocytes Relative: 40 %
Lymphs Abs: 0.8 10*3/uL (ref 0.7–4.0)
MCH: 32.6 pg (ref 26.0–34.0)
MCHC: 32.6 g/dL (ref 30.0–36.0)
MCV: 100 fL (ref 80.0–100.0)
Monocytes Absolute: 0.1 10*3/uL (ref 0.1–1.0)
Monocytes Relative: 6 %
Neutro Abs: 1 10*3/uL — ABNORMAL LOW (ref 1.7–7.7)
Neutrophils Relative %: 50 %
Platelets: 44 10*3/uL — ABNORMAL LOW (ref 150–400)
RBC: 3.1 MIL/uL — ABNORMAL LOW (ref 3.87–5.11)
RDW: 14.6 % (ref 11.5–15.5)
WBC: 2 10*3/uL — ABNORMAL LOW (ref 4.0–10.5)
nRBC: 0 % (ref 0.0–0.2)

## 2022-11-03 LAB — IRON AND TIBC
Iron: 79 ug/dL (ref 28–170)
Saturation Ratios: 21 % (ref 10.4–31.8)
TIBC: 380 ug/dL (ref 250–450)
UIBC: 301 ug/dL

## 2022-11-03 LAB — COMPREHENSIVE METABOLIC PANEL WITH GFR
ALT: 27 U/L (ref 0–44)
AST: 31 U/L (ref 15–41)
Albumin: 3.6 g/dL (ref 3.5–5.0)
Alkaline Phosphatase: 97 U/L (ref 38–126)
Anion gap: 11 (ref 5–15)
BUN: 43 mg/dL — ABNORMAL HIGH (ref 8–23)
CO2: 24 mmol/L (ref 22–32)
Calcium: 8.9 mg/dL (ref 8.9–10.3)
Chloride: 106 mmol/L (ref 98–111)
Creatinine, Ser: 1.35 mg/dL — ABNORMAL HIGH (ref 0.44–1.00)
GFR, Estimated: 39 mL/min — ABNORMAL LOW (ref >60)
Glucose, Bld: 123 mg/dL — ABNORMAL HIGH (ref 70–99)
Potassium: 4.4 mmol/L (ref 3.5–5.1)
Sodium: 141 mmol/L (ref 135–145)
Total Bilirubin: 0.7 mg/dL (ref 0.3–1.2)
Total Protein: 6.8 g/dL (ref 6.5–8.1)

## 2022-11-03 LAB — SAMPLE TO BLOOD BANK

## 2022-11-03 LAB — FERRITIN: Ferritin: 116 ng/mL (ref 11–307)

## 2022-11-03 LAB — PROTIME-INR
INR: 1.2 (ref 0.8–1.2)
Prothrombin Time: 15.5 s — ABNORMAL HIGH (ref 11.4–15.2)

## 2022-11-03 MED ORDER — EPOETIN ALFA-EPBX 10000 UNIT/ML IJ SOLN
10000.0000 [IU] | Freq: Once | INTRAMUSCULAR | Status: AC
  Administered 2022-11-03: 10000 [IU] via SUBCUTANEOUS
  Filled 2022-11-03: qty 1

## 2022-11-03 NOTE — Patient Instructions (Signed)
[facility] at [facility] **VISIT SUMMARY & IMPORTANT INSTRUCTIONS **   You were seen today by Rojelio Brenner PA-C for your follow-up visit.    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 You do not need any IV iron at this time. Continue RETACRIT (epoetin alfa injection) to help your body make more blood cells. We will continue to check your blood counts every 2 weeks on the same day that you receive the Retacrit injection.  LOW PLATELETS & WHITE BLOOD CELLS Your low platelets and low white blood cells remain low, but are overall stable. 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 The other labs that we checked did not show any other reason for you to have any severe bruising.  Your bruising is most likely related to your low platelets.  FOLLOW-UP APPOINTMENT: Office visit in 3 months  ** Thank you for trusting me with your healthcare!  I strive to provide all of my patients with quality care at each visit.  If you receive a survey for this visit, I would be so grateful to you for taking the time to provide feedback.  Thank you in advance!  ~ Nile Dorning                   Dr. Doreatha Massed   &   Rojelio Brenner, PA-C   - - - - - - - - - - - - - - - - - -    Thank you for choosing [facility] at [facility] to provide your oncology and hematology care.  To afford each patient quality time with our provider, please arrive at least 15 minutes before your scheduled appointment time.   If you have a lab appointment with the Cancer Center please come in thru the Main Entrance and check in at the main information desk.  You need to re-schedule your appointment should you arrive 10 or more minutes late.  We strive to give you quality time with our providers, and arriving late  affects you and other patients whose appointments are after yours.  Also, if you no show three or more times for appointments you may be dismissed from the clinic at the providers discretion.     Again, thank you for choosing [facility].  Our hope is that these requests will decrease the amount of time that you wait before being seen by our physicians.       _____________________________________________________________  Should you have questions after your visit to [facility], please contact our office at [phone] and follow the prompts.  Our office hours are 8:00 a.m. and 4:30 p.m. Monday - Friday.  Please note that voicemails left after 4:00 p.m. may not be returned until the following business day.  We are closed weekends and major holidays.  You do have access to a nurse 24-7, just call the main number to the clinic [phone] and do not press any options, hold on the line and a nurse will answer the phone.    For prescription refill requests, have your pharmacy contact our office and allow 72 hours.

## 2022-11-03 NOTE — Progress Notes (Signed)
Orders brought in by patient for Dr. Amil Amen.  Orders placed and drawn.

## 2022-11-03 NOTE — Progress Notes (Signed)
Jeanette Chang presents today for injection per the provider's orders.  Retacrit 10,000U administration without incident; injection site WNL; see MAR for injection details.  Patient tolerated procedure well and without incident.  No questions or complaints noted at this time. Patient's hemoglobin noted to be 10.1 today.  Discharged from clinic ambulatory in stable condition. Alert and oriented x 3. F/U with [facility] as scheduled.

## 2022-11-03 NOTE — Patient Instructions (Signed)
[facility]  Discharge Instructions: Thank you for choosing [facility] to provide your oncology and hematology care.  If you have a lab appointment with the Cancer Center - please note that after April 8th, 2024, all labs will be drawn in the cancer center.  You do not have to check in or register with the main entrance as you have in the past but will complete your check-in in the cancer center.  Wear comfortable clothing and clothing appropriate for easy access to any Portacath or PICC line.   We strive to give you quality time with your provider. You may need to reschedule your appointment if you arrive late (15 or more minutes).  Arriving late affects you and other patients whose appointments are after yours.  Also, if you miss three or more appointments without notifying the office, you may be dismissed from the clinic at the provider's discretion.      For prescription refill requests, have your pharmacy contact our office and allow 72 hours for refills to be completed.    Today you received the Retacrit 10,000U injections   BELOW ARE SYMPTOMS THAT SHOULD BE REPORTED IMMEDIATELY: *FEVER GREATER THAN 100.4 F (38 C) OR HIGHER *CHILLS OR SWEATING *NAUSEA AND VOMITING THAT IS NOT CONTROLLED WITH YOUR NAUSEA MEDICATION *UNUSUAL SHORTNESS OF BREATH *UNUSUAL BRUISING OR BLEEDING *URINARY PROBLEMS (pain or burning when urinating, or frequent urination) *BOWEL PROBLEMS (unusual diarrhea, constipation, pain near the anus) TENDERNESS IN MOUTH AND THROAT WITH OR WITHOUT PRESENCE OF ULCERS (sore throat, sores in mouth, or a toothache) UNUSUAL RASH, SWELLING OR PAIN  UNUSUAL VAGINAL DISCHARGE OR ITCHING   Items with * indicate a potential emergency and should be followed up as soon as possible or go to the Emergency Department if any problems should occur.  Please show the CHEMOTHERAPY ALERT CARD or IMMUNOTHERAPY ALERT CARD at check-in to the Emergency  Department and triage nurse.  Should you have questions after your visit or need to cancel or reschedule your appointment, please contact [facility] [phone]  and follow the prompts.  Office hours are 8:00 a.m. to 4:30 p.m. Monday - Friday. Please note that voicemails left after 4:00 p.m. may not be returned until the following business day.  We are closed weekends and major holidays. You have access to a nurse at all times for urgent questions. Please call the main number to the clinic [phone] and follow the prompts.  For any non-urgent questions, you may also contact your provider using MyChart. We now offer e-Visits for anyone 42 and older to request care online for non-urgent symptoms. For details visit [email or web address].   Also download the MyChart app! Go to the app store, search "MyChart", open the app, select [place], and log in with your MyChart username and password.

## 2022-11-04 LAB — AFP TUMOR MARKER: AFP, Serum, Tumor Marker: <1.8 ng/mL (ref 0.0–8.7)

## 2022-11-09 ENCOUNTER — Ambulatory Visit (HOSPITAL_COMMUNITY)
Admission: RE | Admit: 2022-11-09 | Discharge: 2022-11-09 | Disposition: A | Discharge status: Home / Self Care | Payer: Medicare Other | Source: Ambulatory Visit | Attending: Gastroenterology | Admitting: Gastroenterology

## 2022-11-09 DIAGNOSIS — K7460 Unspecified cirrhosis of liver: Secondary | ICD-10-CM | POA: Insufficient documentation

## 2022-11-09 DIAGNOSIS — K7581 Nonalcoholic steatohepatitis (NASH): Secondary | ICD-10-CM | POA: Insufficient documentation

## 2022-11-09 DIAGNOSIS — Z9049 Acquired absence of other specified parts of digestive tract: Secondary | ICD-10-CM | POA: Diagnosis not present

## 2022-11-10 DIAGNOSIS — Z23 Encounter for immunization: Secondary | ICD-10-CM | POA: Diagnosis not present

## 2022-11-10 DIAGNOSIS — E1165 Type 2 diabetes mellitus with hyperglycemia: Secondary | ICD-10-CM | POA: Diagnosis not present

## 2022-11-10 DIAGNOSIS — Z299 Encounter for prophylactic measures, unspecified: Secondary | ICD-10-CM | POA: Diagnosis not present

## 2022-11-10 DIAGNOSIS — I10 Essential (primary) hypertension: Secondary | ICD-10-CM | POA: Diagnosis not present

## 2022-11-10 DIAGNOSIS — E1142 Type 2 diabetes mellitus with diabetic polyneuropathy: Secondary | ICD-10-CM | POA: Diagnosis not present

## 2022-11-17 ENCOUNTER — Inpatient Hospital Stay: Payer: Medicare Other

## 2022-11-17 ENCOUNTER — Other Ambulatory Visit (INDEPENDENT_AMBULATORY_CARE_PROVIDER_SITE_OTHER): Payer: Self-pay | Admitting: *Deleted

## 2022-11-17 ENCOUNTER — Inpatient Hospital Stay: Payer: Medicare Other | Attending: Hematology

## 2022-11-17 VITALS — BP 142/43 | HR 67 | Temp 97.5°F | Resp 17

## 2022-11-17 DIAGNOSIS — D509 Iron deficiency anemia, unspecified: Secondary | ICD-10-CM

## 2022-11-17 DIAGNOSIS — N1831 Chronic kidney disease, stage 3a: Secondary | ICD-10-CM

## 2022-11-17 DIAGNOSIS — D631 Anemia in chronic kidney disease: Secondary | ICD-10-CM | POA: Insufficient documentation

## 2022-11-17 DIAGNOSIS — D696 Thrombocytopenia, unspecified: Secondary | ICD-10-CM

## 2022-11-17 DIAGNOSIS — D500 Iron deficiency anemia secondary to blood loss (chronic): Secondary | ICD-10-CM

## 2022-11-17 LAB — CBC
HCT: 32.9 % — ABNORMAL LOW (ref 36.0–46.0)
Hemoglobin: 10.3 g/dL — ABNORMAL LOW (ref 12.0–15.0)
MCH: 31.6 pg (ref 26.0–34.0)
MCHC: 31.3 g/dL (ref 30.0–36.0)
MCV: 100.9 fL — ABNORMAL HIGH (ref 80.0–100.0)
Platelets: 41 10*3/uL — ABNORMAL LOW (ref 150–400)
RBC: 3.26 MIL/uL — ABNORMAL LOW (ref 3.87–5.11)
RDW: 14.2 % (ref 11.5–15.5)
WBC: 2.1 10*3/uL — ABNORMAL LOW (ref 4.0–10.5)
nRBC: 0 % (ref 0.0–0.2)

## 2022-11-17 LAB — SAMPLE TO BLOOD BANK

## 2022-11-17 MED ORDER — EPOETIN ALFA-EPBX 10000 UNIT/ML IJ SOLN
10000.0000 [IU] | Freq: Once | INTRAMUSCULAR | Status: AC
  Administered 2022-11-17: 10000 [IU] via SUBCUTANEOUS
  Filled 2022-11-17: qty 1

## 2022-11-23 ENCOUNTER — Other Ambulatory Visit (INDEPENDENT_AMBULATORY_CARE_PROVIDER_SITE_OTHER): Payer: Self-pay | Admitting: Gastroenterology

## 2022-11-23 DIAGNOSIS — R1030 Lower abdominal pain, unspecified: Secondary | ICD-10-CM

## 2022-12-01 ENCOUNTER — Inpatient Hospital Stay: Payer: Medicare Other

## 2022-12-01 VITALS — BP 151/77 | HR 74 | Temp 97.8°F | Resp 18

## 2022-12-01 DIAGNOSIS — D631 Anemia in chronic kidney disease: Secondary | ICD-10-CM | POA: Diagnosis not present

## 2022-12-01 DIAGNOSIS — D509 Iron deficiency anemia, unspecified: Secondary | ICD-10-CM

## 2022-12-01 DIAGNOSIS — D500 Iron deficiency anemia secondary to blood loss (chronic): Secondary | ICD-10-CM

## 2022-12-01 DIAGNOSIS — D696 Thrombocytopenia, unspecified: Secondary | ICD-10-CM

## 2022-12-01 DIAGNOSIS — N1831 Chronic kidney disease, stage 3a: Secondary | ICD-10-CM | POA: Diagnosis not present

## 2022-12-01 LAB — CBC
HCT: 31 % — ABNORMAL LOW (ref 36.0–46.0)
Hemoglobin: 9.7 g/dL — ABNORMAL LOW (ref 12.0–15.0)
MCH: 32 pg (ref 26.0–34.0)
MCHC: 31.3 g/dL (ref 30.0–36.0)
MCV: 102.3 fL — ABNORMAL HIGH (ref 80.0–100.0)
Platelets: 47 10*3/uL — ABNORMAL LOW (ref 150–400)
RBC: 3.03 MIL/uL — ABNORMAL LOW (ref 3.87–5.11)
RDW: 14.6 % (ref 11.5–15.5)
WBC: 2.1 10*3/uL — ABNORMAL LOW (ref 4.0–10.5)
nRBC: 0 % (ref 0.0–0.2)

## 2022-12-01 LAB — SAMPLE TO BLOOD BANK

## 2022-12-01 MED ORDER — EPOETIN ALFA-EPBX 10000 UNIT/ML IJ SOLN
10000.0000 [IU] | Freq: Once | INTRAMUSCULAR | Status: AC
  Administered 2022-12-01: 10000 [IU] via SUBCUTANEOUS
  Filled 2022-12-01: qty 1

## 2022-12-01 NOTE — Progress Notes (Signed)
Jeanette Chang presents today for injection per the provider's orders.  Retacrit administration without incident; injection site WNL; see MAR for injection details.  Patient tolerated procedure well and without incident.  No questions or complaints noted at this time. Discharged from clinic ambulatory in stable condition. Alert and oriented x 3. F/U with [facility] as scheduled.

## 2022-12-01 NOTE — Patient Instructions (Signed)
[facility] [place]  Discharge Instructions: Thank you for choosing [facility] to provide your oncology and hematology care.  If you have a lab appointment with the Cancer Center - please note that after April 8th, 2024, all labs will be drawn in the cancer center.  You do not have to check in or register with the main entrance as you have in the past but will complete your check-in in the cancer center.  Wear comfortable clothing and clothing appropriate for easy access to any Portacath or PICC line.   We strive to give you quality time with your provider. You may need to reschedule your appointment if you arrive late (15 or more minutes).  Arriving late affects you and other patients whose appointments are after yours.  Also, if you miss three or more appointments without notifying the office, you may be dismissed from the clinic at the provider's discretion.      For prescription refill requests, have your pharmacy contact our office and allow 72 hours for refills to be completed.    Today you received the following chemotherapy and/or immunotherapy agents Retacrit.  Epoetin Alfa Injection What is this medication? EPOETIN ALFA (e POE e tin AL fa) treats low levels of red blood cells (anemia) caused by kidney disease, chemotherapy, or HIV medications. It can also be used in people who are at risk for blood loss during surgery. It works by helping your body make more red blood cells, which reduces the need for blood transfusions. This medicine may be used for other purposes; ask your health care provider or pharmacist if you have questions. COMMON BRAND NAME(S): Epogen, Procrit, Retacrit What should I tell my care team before I take this medication? They need to know if you have any of these conditions: Blood clots Cancer Heart disease High blood pressure On dialysis Seizures Stroke An unusual or allergic reaction to epoetin alfa, albumin, benzyl alcohol, other  medications, foods, dyes, or preservatives Pregnant or trying to get pregnant Breast-feeding How should I use this medication? This medication is injected into a vein or under the skin. It is usually given by your care team in a hospital or clinic setting. It may also be given at home. If you get this medication at home, you will be taught how to prepare and give it. Use exactly as directed. Take it as directed on the prescription label at the same time every day. Keep taking it unless your care team tells you to stop. It is important that you put your used needles and syringes in a special sharps container. Do not put them in a trash can. If you do not have a sharps container, call your pharmacist or care team to get one. A special MedGuide will be given to you by the pharmacist with each prescription and refill. Be sure to read this information carefully each time. Talk to your care team about the use of this medication in children. While this medication may be used in children as young as 1 month of age for selected conditions, precautions do apply. Overdosage: If you think you have taken too much of this medicine contact a poison control center or emergency room at once. NOTE: This medicine is only for you. Do not share this medicine with others. What if I miss a dose? If you miss a dose, take it as soon as you can. If it is almost time for your next dose, take only that dose. Do   not take double or extra doses. What may interact with this medication? Darbepoetin alfa Methoxy polyethylene glycol-epoetin beta This list may not describe all possible interactions. Give your health care provider a list of all the medicines, herbs, non-prescription drugs, or dietary supplements you use. Also tell them if you smoke, drink alcohol, or use illegal drugs. Some items may interact with your medicine. What should I watch for while using this medication? Visit your care team for regular checks on your  progress. Check your blood pressure as directed. Know what your blood pressure should be and when to contact your care team. Your condition will be monitored carefully while you are receiving this medication. You may need blood work while taking this medication. What side effects may I notice from receiving this medication? Side effects that you should report to your care team as soon as possible: Allergic reactions--skin rash, itching, hives, swelling of the face, lips, tongue, or throat Blood clot--pain, swelling, or warmth in the leg, shortness of breath, chest pain Heart attack--pain or tightness in the chest, shoulders, arms, or jaw, nausea, shortness of breath, cold or clammy skin, feeling faint or lightheaded Increase in blood pressure Rash, fever, and swollen lymph nodes Redness, blistering, peeling, or loosening of the skin, including inside the mouth Seizures Stroke--sudden numbness or weakness of the face, arm, or leg, trouble speaking, confusion, trouble walking, loss of balance or coordination, dizziness, severe headache, change in vision Side effects that usually do not require medical attention (report to your care team if they continue or are bothersome): Bone, joint, or muscle pain Cough Headache Nausea Pain, redness, or irritation at injection site This list may not describe all possible side effects. Call your doctor for medical advice about side effects. You may report side effects to FDA at 1-800-FDA-1088. Where should I keep my medication? Keep out of the reach of children and pets. Store in a refrigerator. Do not freeze. Do not shake. Protect from light. Keep this medication in the original container until you are ready to take it. See product for storage information. Get rid of any unused medication after the expiration date. To get rid of medications that are no longer needed or have expired: Take the medication to a medication take-back program. Check with your  pharmacy or law enforcement to find a location. If you cannot return the medication, ask your pharmacist or care team how to get rid of the medication safely. NOTE: This sheet is a summary. It may not cover all possible information. If you have questions about this medicine, talk to your doctor, pharmacist, or health care provider.  2024 Elsevier/Gold Standard (2021-06-05 00:00:00)       To help prevent nausea and vomiting after your treatment, we encourage you to take your nausea medication as directed.  BELOW ARE SYMPTOMS THAT SHOULD BE REPORTED IMMEDIATELY: *FEVER GREATER THAN 100.4 F (38 C) OR HIGHER *CHILLS OR SWEATING *NAUSEA AND VOMITING THAT IS NOT CONTROLLED WITH YOUR NAUSEA MEDICATION *UNUSUAL SHORTNESS OF BREATH *UNUSUAL BRUISING OR BLEEDING *URINARY PROBLEMS (pain or burning when urinating, or frequent urination) *BOWEL PROBLEMS (unusual diarrhea, constipation, pain near the anus) TENDERNESS IN MOUTH AND THROAT WITH OR WITHOUT PRESENCE OF ULCERS (sore throat, sores in mouth, or a toothache) UNUSUAL RASH, SWELLING OR PAIN  UNUSUAL VAGINAL DISCHARGE OR ITCHING   Items with * indicate a potential emergency and should be followed up as soon as possible or go to the Emergency Department if any problems should occur.  Please show   the CHEMOTHERAPY ALERT CARD or IMMUNOTHERAPY ALERT CARD at check-in to the Emergency Department and triage nurse.  Should you have questions after your visit or need to cancel or reschedule your appointment, please contact [facility] [place] [phone]  and follow the prompts.  Office hours are 8:00 a.m. to 4:30 p.m. Monday - Friday. Please note that voicemails left after 4:00 p.m. may not be returned until the following business day.  We are closed weekends and major holidays. You have access to a nurse at all times for urgent questions. Please call the main number to the clinic [phone] and follow the prompts.  For any non-urgent  questions, you may also contact your provider using MyChart. We now offer e-Visits for anyone 18 and older to request care online for non-urgent symptoms. For details visit [email or web address].   Also download the MyChart app! Go to the app store, search "MyChart", open the app, select [place], and log in with your MyChart username and password.   

## 2022-12-15 ENCOUNTER — Inpatient Hospital Stay: Payer: Medicare Other

## 2022-12-20 DIAGNOSIS — R42 Dizziness and giddiness: Secondary | ICD-10-CM | POA: Diagnosis not present

## 2022-12-20 DIAGNOSIS — Z1339 Encounter for screening examination for other mental health and behavioral disorders: Secondary | ICD-10-CM | POA: Diagnosis not present

## 2022-12-20 DIAGNOSIS — I10 Essential (primary) hypertension: Secondary | ICD-10-CM | POA: Diagnosis not present

## 2022-12-20 DIAGNOSIS — Z79899 Other long term (current) drug therapy: Secondary | ICD-10-CM | POA: Diagnosis not present

## 2022-12-20 DIAGNOSIS — Z0000 Encounter for general adult medical examination without abnormal findings: Secondary | ICD-10-CM | POA: Diagnosis not present

## 2022-12-20 DIAGNOSIS — E7800 Pure hypercholesterolemia, unspecified: Secondary | ICD-10-CM | POA: Diagnosis not present

## 2022-12-20 DIAGNOSIS — Z1331 Encounter for screening for depression: Secondary | ICD-10-CM | POA: Diagnosis not present

## 2022-12-20 DIAGNOSIS — R52 Pain, unspecified: Secondary | ICD-10-CM | POA: Diagnosis not present

## 2022-12-20 DIAGNOSIS — Z299 Encounter for prophylactic measures, unspecified: Secondary | ICD-10-CM | POA: Diagnosis not present

## 2022-12-20 DIAGNOSIS — R5383 Other fatigue: Secondary | ICD-10-CM | POA: Diagnosis not present

## 2022-12-20 DIAGNOSIS — Z7189 Other specified counseling: Secondary | ICD-10-CM | POA: Diagnosis not present

## 2022-12-27 DIAGNOSIS — R42 Dizziness and giddiness: Secondary | ICD-10-CM | POA: Diagnosis not present

## 2022-12-29 ENCOUNTER — Inpatient Hospital Stay: Payer: Medicare Other

## 2022-12-29 ENCOUNTER — Other Ambulatory Visit: Payer: Self-pay | Admitting: Physician Assistant

## 2022-12-29 ENCOUNTER — Inpatient Hospital Stay: Payer: Medicare Other | Attending: Hematology

## 2022-12-29 VITALS — BP 152/66 | HR 74 | Temp 97.5°F | Resp 18

## 2022-12-29 DIAGNOSIS — D509 Iron deficiency anemia, unspecified: Secondary | ICD-10-CM

## 2022-12-29 DIAGNOSIS — D631 Anemia in chronic kidney disease: Secondary | ICD-10-CM | POA: Diagnosis not present

## 2022-12-29 DIAGNOSIS — H353132 Nonexudative age-related macular degeneration, bilateral, intermediate dry stage: Secondary | ICD-10-CM | POA: Diagnosis not present

## 2022-12-29 DIAGNOSIS — N1831 Chronic kidney disease, stage 3a: Secondary | ICD-10-CM | POA: Diagnosis not present

## 2022-12-29 DIAGNOSIS — D500 Iron deficiency anemia secondary to blood loss (chronic): Secondary | ICD-10-CM

## 2022-12-29 DIAGNOSIS — D696 Thrombocytopenia, unspecified: Secondary | ICD-10-CM

## 2022-12-29 LAB — IRON AND TIBC
Iron: 71 ug/dL (ref 28–170)
Saturation Ratios: 17 % (ref 10.4–31.8)
TIBC: 418 ug/dL (ref 250–450)
UIBC: 347 ug/dL

## 2022-12-29 LAB — CBC
HCT: 29.7 % — ABNORMAL LOW (ref 36.0–46.0)
Hemoglobin: 9.5 g/dL — ABNORMAL LOW (ref 12.0–15.0)
MCH: 31.7 pg (ref 26.0–34.0)
MCHC: 32 g/dL (ref 30.0–36.0)
MCV: 99 fL (ref 80.0–100.0)
Platelets: 55 10*3/uL — ABNORMAL LOW (ref 150–400)
RBC: 3 MIL/uL — ABNORMAL LOW (ref 3.87–5.11)
RDW: 14 % (ref 11.5–15.5)
WBC: 2.3 10*3/uL — ABNORMAL LOW (ref 4.0–10.5)
nRBC: 0 % (ref 0.0–0.2)

## 2022-12-29 LAB — SAMPLE TO BLOOD BANK

## 2022-12-29 LAB — FERRITIN: Ferritin: 25 ng/mL (ref 11–307)

## 2022-12-29 MED ORDER — EPOETIN ALFA-EPBX 10000 UNIT/ML IJ SOLN
10000.0000 [IU] | Freq: Once | INTRAMUSCULAR | Status: AC
Start: 2022-12-29 — End: 2022-12-29
  Administered 2022-12-29: 10000 [IU] via SUBCUTANEOUS
  Filled 2022-12-29: qty 1

## 2022-12-29 NOTE — Progress Notes (Signed)
HGB 9.5 today. Blood pressure within parameters for treatment.   Jeanette Chang presents today for injection per the provider's orders.  Retacrit administration without incident; injection site WNL; see MAR for injection details.  Patient tolerated procedure well and without incident. No complaints at this time. Discharged from clinic ambulatory in stable condition. Alert and oriented x 3. F/U with [facility] as scheduled.

## 2022-12-29 NOTE — Patient Instructions (Signed)
[facility]  Discharge Instructions: Thank you for choosing  Cancer Center to provide your oncology and hematology care.  If you have a lab appointment with the Cancer Center - please note that after April 8th, 2024, all labs will be drawn in the cancer center.  You do not have to check in or register with the main entrance as you have in the past but will complete your check-in in the cancer center.  Wear comfortable clothing and clothing appropriate for easy access to any Portacath or PICC line.   We strive to give you quality time with your provider. You may need to reschedule your appointment if you arrive late (15 or more minutes).  Arriving late affects you and other patients whose appointments are after yours.  Also, if you miss three or more appointments without notifying the office, you may be dismissed from the clinic at the provider's discretion.      For prescription refill requests, have your pharmacy contact our office and allow 72 hours for refills to be completed.    Today you received the following chemotherapy and/or immunotherapy agents retacrit Epoetin Alfa Injection What is this medication? EPOETIN ALFA (e POE e tin AL fa) treats low levels of red blood cells (anemia) caused by kidney disease, chemotherapy, or HIV medications. It can also be used in people who are at risk for blood loss during surgery. It works by Systems analyst make more red blood cells, which reduces the need for blood transfusions. This medicine may be used for other purposes; ask your health care provider or pharmacist if you have questions. COMMON BRAND NAME(S): Epogen, Procrit, Retacrit What should I tell my care team before I take this medication? They need to know if you have any of these conditions: Blood clots Cancer Heart disease High blood pressure On dialysis Seizures Stroke An unusual or allergic reaction to epoetin  alfa, albumin, benzyl alcohol, other medications, foods, dyes, or preservatives Pregnant or trying to get pregnant Breast-feeding How should I use this medication? This medication is injected into a vein or under the skin. It is usually given by your care team in a hospital or clinic setting. It may also be given at home. If you get this medication at home, you will be taught how to prepare and give it. Use exactly as directed. Take it as directed on the prescription label at the same time every day. Keep taking it unless your care team tells you to stop. It is important that you put your used needles and syringes in a special sharps container. Do not put them in a trash can. If you do not have a sharps container, call your pharmacist or care team to get one. A special MedGuide will be given to you by the pharmacist with each prescription and refill. Be sure to read this information carefully each time. Talk to your care team about the use of this medication in children. While this medication may be used in children as young as 1 month of age for selected conditions, precautions do apply. Overdosage: If you think you have taken too much of this medicine contact a poison control center or emergency room at once. NOTE: This medicine is only for you. Do not share this medicine with others. What if I miss a dose? If you miss a dose, take it as soon as you can. If it is almost time for your  next dose, take only that dose. Do not take double or extra doses. What may interact with this medication? Darbepoetin alfa Methoxy polyethylene glycol-epoetin beta This list may not describe all possible interactions. Give your health care provider a list of all the medicines, herbs, non-prescription drugs, or dietary supplements you use. Also tell them if you smoke, drink alcohol, or use illegal drugs. Some items may interact with your medicine. What should I watch for while using this medication? Visit your care  team for regular checks on your progress. Check your blood pressure as directed. Know what your blood pressure should be and when to contact your care team. Your condition will be monitored carefully while you are receiving this medication. You may need blood work while taking this medication. What side effects may I notice from receiving this medication? Side effects that you should report to your care team as soon as possible: Allergic reactions--skin rash, itching, hives, swelling of the face, lips, tongue, or throat Blood clot--pain, swelling, or warmth in the leg, shortness of breath, chest pain Heart attack--pain or tightness in the chest, shoulders, arms, or jaw, nausea, shortness of breath, cold or clammy skin, feeling faint or lightheaded Increase in blood pressure Rash, fever, and swollen lymph nodes Redness, blistering, peeling, or loosening of the skin, including inside the mouth Seizures Stroke--sudden numbness or weakness of the face, arm, or leg, trouble speaking, confusion, trouble walking, loss of balance or coordination, dizziness, severe headache, change in vision Side effects that usually do not require medical attention (report to your care team if they continue or are bothersome): Bone, joint, or muscle pain Cough Headache Nausea Pain, redness, or irritation at injection site This list may not describe all possible side effects. Call your doctor for medical advice about side effects. You may report side effects to FDA at 1-800-FDA-1088. Where should I keep my medication? Keep out of the reach of children and pets. Store in a refrigerator. Do not freeze. Do not shake. Protect from light. Keep this medication in the original container until you are ready to take it. See product for storage information. Get rid of any unused medication after the expiration date. To get rid of medications that are no longer needed or have expired: Take the medication to a medication take-back  program. Check with your pharmacy or law enforcement to find a location. If you cannot return the medication, ask your pharmacist or care team how to get rid of the medication safely. NOTE: This sheet is a summary. It may not cover all possible information. If you have questions about this medicine, talk to your doctor, pharmacist, or health care provider.  2024 Elsevier/Gold Standard (2021-06-05 00:00:00)       To help prevent nausea and vomiting after your treatment, we encourage you to take your nausea medication as directed.  BELOW ARE SYMPTOMS THAT SHOULD BE REPORTED IMMEDIATELY: *FEVER GREATER THAN 100.4 F (38 C) OR HIGHER *CHILLS OR SWEATING *NAUSEA AND VOMITING THAT IS NOT CONTROLLED WITH YOUR NAUSEA MEDICATION *UNUSUAL SHORTNESS OF BREATH *UNUSUAL BRUISING OR BLEEDING *URINARY PROBLEMS (pain or burning when urinating, or frequent urination) *BOWEL PROBLEMS (unusual diarrhea, constipation, pain near the anus) TENDERNESS IN MOUTH AND THROAT WITH OR WITHOUT PRESENCE OF ULCERS (sore throat, sores in mouth, or a toothache) UNUSUAL RASH, SWELLING OR PAIN  UNUSUAL VAGINAL DISCHARGE OR ITCHING   Items with * indicate a potential emergency and should be followed up as soon as possible or go to the Emergency Department if  any problems should occur.  Please show the CHEMOTHERAPY ALERT CARD or IMMUNOTHERAPY ALERT CARD at check-in to the Emergency Department and triage nurse.  Should you have questions after your visit or need to cancel or reschedule your appointment, please contact [place] [facility] - A DEPT OF [facility] [phone]  and follow the prompts.  Office hours are 8:00 a.m. to 4:30 p.m. Monday - Friday. Please note that voicemails left after 4:00 p.m. may not be returned until the following business day.  We are closed weekends and major holidays. You have access to a nurse at all times for urgent questions. Please call the main number to the clinic  [phone] and follow the prompts.  For any non-urgent questions, you may also contact your provider using MyChart. We now offer e-Visits for anyone 83 and older to request care online for non-urgent symptoms. For details visit [email or web address].   Also download the MyChart app! Go to the app store, search "MyChart", open the app, select [place], and log in with your MyChart username and password.

## 2022-12-29 NOTE — Progress Notes (Signed)
Per family request, iron panel was added to today's labs due to patient fatigue.  Labs confirm iron deficiency with ferritin 25, iron saturation 17%.  We will schedule her for IV Feraheme x 2.  Patient/family aware.

## 2022-12-31 DIAGNOSIS — R519 Headache, unspecified: Secondary | ICD-10-CM | POA: Diagnosis not present

## 2022-12-31 DIAGNOSIS — R42 Dizziness and giddiness: Secondary | ICD-10-CM | POA: Diagnosis not present

## 2022-12-31 DIAGNOSIS — R251 Tremor, unspecified: Secondary | ICD-10-CM | POA: Diagnosis not present

## 2023-01-10 ENCOUNTER — Inpatient Hospital Stay: Payer: Medicare Other

## 2023-01-10 VITALS — BP 103/54 | HR 60 | Temp 97.8°F | Resp 20

## 2023-01-10 DIAGNOSIS — D631 Anemia in chronic kidney disease: Secondary | ICD-10-CM | POA: Diagnosis not present

## 2023-01-10 DIAGNOSIS — N1831 Chronic kidney disease, stage 3a: Secondary | ICD-10-CM | POA: Diagnosis not present

## 2023-01-10 DIAGNOSIS — D509 Iron deficiency anemia, unspecified: Secondary | ICD-10-CM

## 2023-01-10 MED ORDER — SODIUM CHLORIDE 0.9 % IV SOLN
INTRAVENOUS | Status: DC
Start: 2023-01-10 — End: 2023-01-10

## 2023-01-10 MED ORDER — ACETAMINOPHEN 325 MG PO TABS
650.0000 mg | ORAL_TABLET | Freq: Once | ORAL | Status: AC
Start: 2023-01-10 — End: 2023-01-10
  Administered 2023-01-10: 650 mg via ORAL
  Filled 2023-01-10: qty 2

## 2023-01-10 MED ORDER — CETIRIZINE HCL 10 MG PO TABS
10.0000 mg | ORAL_TABLET | Freq: Once | ORAL | Status: AC
Start: 2023-01-10 — End: 2023-01-10
  Administered 2023-01-10: 10 mg via ORAL
  Filled 2023-01-10: qty 1

## 2023-01-10 MED ORDER — SODIUM CHLORIDE 0.9 % IV SOLN
510.0000 mg | Freq: Once | INTRAVENOUS | Status: AC
  Administered 2023-01-10: 510 mg via INTRAVENOUS
  Filled 2023-01-10: qty 510

## 2023-01-10 NOTE — Progress Notes (Signed)
Patient tolerated iron infusion with no complaints voiced.  Peripheral IV site clean and dry with good blood return noted before and after infusion.  Band aid applied.  VSS with discharge and left in satisfactory condition with no s/s of distress noted.

## 2023-01-10 NOTE — Patient Instructions (Signed)
[place] [facility]  Discharge Instructions: Thank you for choosing [facility] to provide your oncology and hematology care.  If you have a lab appointment with the Cancer Center - please note that after April 8th, 2024, all labs will be drawn in the cancer center.  You do not have to check in or register with the main entrance as you have in the past but will complete your check-in in the cancer center.  Wear comfortable clothing and clothing appropriate for easy access to any Portacath or PICC line.   We strive to give you quality time with your provider. You may need to reschedule your appointment if you arrive late (15 or more minutes).  Arriving late affects you and other patients whose appointments are after yours.  Also, if you miss three or more appointments without notifying the office, you may be dismissed from the clinic at the provider's discretion.      For prescription refill requests, have your pharmacy contact our office and allow 72 hours for refills to be completed.    Today you received the following [facility], return scheduled.   To help prevent nausea and vomiting after your treatment, we encourage you to take your nausea medication as directed.  BELOW ARE SYMPTOMS THAT SHOULD BE REPORTED IMMEDIATELY: *FEVER GREATER THAN 100.4 F (38 C) OR HIGHER *CHILLS OR SWEATING *NAUSEA AND VOMITING THAT IS NOT CONTROLLED WITH YOUR NAUSEA MEDICATION *UNUSUAL SHORTNESS OF BREATH *UNUSUAL BRUISING OR BLEEDING *URINARY PROBLEMS (pain or burning when urinating, or frequent urination) *BOWEL PROBLEMS (unusual diarrhea, constipation, pain near the anus) TENDERNESS IN MOUTH AND THROAT WITH OR WITHOUT PRESENCE OF ULCERS (sore throat, sores in mouth, or a toothache) UNUSUAL RASH, SWELLING OR PAIN  UNUSUAL VAGINAL DISCHARGE OR ITCHING   Items with * indicate a potential emergency and should be followed up as soon as possible or  go to the Emergency Department if any problems should occur.  Please show the CHEMOTHERAPY ALERT CARD or IMMUNOTHERAPY ALERT CARD at check-in to the Emergency Department and triage nurse.  Should you have questions after your visit or need to cancel or reschedule your appointment, please contact [place] [facility] - A DEPT OF [facility] [phone]  and follow the prompts.  Office hours are 8:00 a.m. to 4:30 p.m. Monday - Friday. Please note that voicemails left after 4:00 p.m. may not be returned until the following business day.  We are closed weekends and major holidays. You have access to a nurse at all times for urgent questions. Please call the main number to the clinic [phone] and follow the prompts.  For any non-urgent questions, you may also contact your provider using MyChart. We now offer e-Visits for anyone 43 and older to request care online for non-urgent symptoms. For details visit [email or web address].   Also download the MyChart app! Go to the app store, search "MyChart", open the app, select [place], and log in with your MyChart username and password.

## 2023-01-11 DIAGNOSIS — I10 Essential (primary) hypertension: Secondary | ICD-10-CM | POA: Diagnosis not present

## 2023-01-11 DIAGNOSIS — M79606 Pain in leg, unspecified: Secondary | ICD-10-CM | POA: Diagnosis not present

## 2023-01-11 DIAGNOSIS — G8194 Hemiplegia, unspecified affecting left nondominant side: Secondary | ICD-10-CM | POA: Diagnosis not present

## 2023-01-11 DIAGNOSIS — E1169 Type 2 diabetes mellitus with other specified complication: Secondary | ICD-10-CM | POA: Diagnosis not present

## 2023-01-11 DIAGNOSIS — Z299 Encounter for prophylactic measures, unspecified: Secondary | ICD-10-CM | POA: Diagnosis not present

## 2023-01-12 ENCOUNTER — Inpatient Hospital Stay: Payer: Medicare Other

## 2023-01-12 VITALS — BP 129/39 | HR 87 | Temp 97.5°F | Resp 20

## 2023-01-12 DIAGNOSIS — D631 Anemia in chronic kidney disease: Secondary | ICD-10-CM | POA: Diagnosis not present

## 2023-01-12 DIAGNOSIS — D500 Iron deficiency anemia secondary to blood loss (chronic): Secondary | ICD-10-CM

## 2023-01-12 DIAGNOSIS — D696 Thrombocytopenia, unspecified: Secondary | ICD-10-CM

## 2023-01-12 DIAGNOSIS — N1831 Chronic kidney disease, stage 3a: Secondary | ICD-10-CM | POA: Diagnosis not present

## 2023-01-12 DIAGNOSIS — D509 Iron deficiency anemia, unspecified: Secondary | ICD-10-CM

## 2023-01-12 LAB — CBC
HCT: 29.8 % — ABNORMAL LOW (ref 36.0–46.0)
Hemoglobin: 8.9 g/dL — ABNORMAL LOW (ref 12.0–15.0)
MCH: 30 pg (ref 26.0–34.0)
MCHC: 29.9 g/dL — ABNORMAL LOW (ref 30.0–36.0)
MCV: 100.3 fL — ABNORMAL HIGH (ref 80.0–100.0)
Platelets: 62 K/uL — ABNORMAL LOW (ref 150–400)
RBC: 2.97 MIL/uL — ABNORMAL LOW (ref 3.87–5.11)
RDW: 13.9 % (ref 11.5–15.5)
WBC: 3.6 K/uL — ABNORMAL LOW (ref 4.0–10.5)
nRBC: 0 % (ref 0.0–0.2)

## 2023-01-12 LAB — SAMPLE TO BLOOD BANK

## 2023-01-12 MED ORDER — EPOETIN ALFA-EPBX 10000 UNIT/ML IJ SOLN
10000.0000 [IU] | Freq: Once | INTRAMUSCULAR | Status: AC
Start: 2023-01-12 — End: 2023-01-12
  Administered 2023-01-12: 10000 [IU] via SUBCUTANEOUS
  Filled 2023-01-12: qty 1

## 2023-01-12 NOTE — Patient Instructions (Signed)
[facility] - A DEPT OF [facility]  Discharge Instructions: Thank you for choosing [facility] to provide your oncology and hematology care.  If you have a lab appointment with the Cancer Center - please note that after April 8th, 2024, all labs will be drawn in the cancer center.  You do not have to check in or register with the main entrance as you have in the past but will complete your check-in in the cancer center.  Wear comfortable clothing and clothing appropriate for easy access to any Portacath or PICC line.   We strive to give you quality time with your provider. You may need to reschedule your appointment if you arrive late (15 or more minutes).  Arriving late affects you and other patients whose appointments are after yours.  Also, if you miss three or more appointments without notifying the office, you may be dismissed from the clinic at the provider's discretion.      For prescription refill requests, have your pharmacy contact our office and allow 72 hours for refills to be completed.    Today you received the Retacrit 10,000U injection.       BELOW ARE SYMPTOMS THAT SHOULD BE REPORTED IMMEDIATELY: *FEVER GREATER THAN 100.4 F (38 C) OR HIGHER *CHILLS OR SWEATING *NAUSEA AND VOMITING THAT IS NOT CONTROLLED WITH YOUR NAUSEA MEDICATION *UNUSUAL SHORTNESS OF BREATH *UNUSUAL BRUISING OR BLEEDING *URINARY PROBLEMS (pain or burning when urinating, or frequent urination) *BOWEL PROBLEMS (unusual diarrhea, constipation, pain near the anus) TENDERNESS IN MOUTH AND THROAT WITH OR WITHOUT PRESENCE OF ULCERS (sore throat, sores in mouth, or a toothache) UNUSUAL RASH, SWELLING OR PAIN  UNUSUAL VAGINAL DISCHARGE OR ITCHING   Items with * indicate a potential emergency and should be followed up as soon as possible or go to the Emergency Department if any problems should occur.  Please show the CHEMOTHERAPY ALERT CARD or IMMUNOTHERAPY ALERT  CARD at check-in to the Emergency Department and triage nurse.  Should you have questions after your visit or need to cancel or reschedule your appointment, please contact [facility] - A DEPT OF [facility] [phone]  and follow the prompts.  Office hours are 8:00 a.m. to 4:30 p.m. Monday - Friday. Please note that voicemails left after 4:00 p.m. may not be returned until the following business day.  We are closed weekends and major holidays. You have access to a nurse at all times for urgent questions. Please call the main number to the clinic [phone] and follow the prompts.  For any non-urgent questions, you may also contact your provider using MyChart. We now offer e-Visits for anyone 81 and older to request care online for non-urgent symptoms. For details visit [email or web address].   Also download the MyChart app! Go to the app store, search "MyChart", open the app, select [place], and log in with your MyChart username and password.

## 2023-01-12 NOTE — Progress Notes (Signed)
Jeanette Chang presents today for injection per the provider's orders.  Retacrit 10,000U administration without incident; injection site WNL; see MAR for injection details.  Patient tolerated procedure well and without incident.  No questions or complaints noted at this time. Patient's hemoglobin noted to be 8.9 today.  Discharged from clinic ambulatory with cane in stable condition. Alert and oriented x 3. F/U with [facility] as scheduled.

## 2023-01-17 DIAGNOSIS — Z299 Encounter for prophylactic measures, unspecified: Secondary | ICD-10-CM | POA: Diagnosis not present

## 2023-01-17 DIAGNOSIS — R42 Dizziness and giddiness: Secondary | ICD-10-CM | POA: Diagnosis not present

## 2023-01-17 DIAGNOSIS — J40 Bronchitis, not specified as acute or chronic: Secondary | ICD-10-CM | POA: Diagnosis not present

## 2023-01-17 DIAGNOSIS — I10 Essential (primary) hypertension: Secondary | ICD-10-CM | POA: Diagnosis not present

## 2023-01-21 ENCOUNTER — Inpatient Hospital Stay: Payer: Medicare Other | Attending: Hematology

## 2023-01-21 VITALS — BP 158/55 | HR 70 | Temp 98.6°F | Resp 16

## 2023-01-21 DIAGNOSIS — N1832 Chronic kidney disease, stage 3b: Secondary | ICD-10-CM | POA: Insufficient documentation

## 2023-01-21 DIAGNOSIS — D631 Anemia in chronic kidney disease: Secondary | ICD-10-CM | POA: Insufficient documentation

## 2023-01-21 DIAGNOSIS — D509 Iron deficiency anemia, unspecified: Secondary | ICD-10-CM

## 2023-01-21 DIAGNOSIS — D500 Iron deficiency anemia secondary to blood loss (chronic): Secondary | ICD-10-CM | POA: Diagnosis not present

## 2023-01-21 DIAGNOSIS — N1831 Chronic kidney disease, stage 3a: Secondary | ICD-10-CM | POA: Diagnosis present

## 2023-01-21 MED ORDER — CETIRIZINE HCL 10 MG PO TABS
10.0000 mg | ORAL_TABLET | Freq: Once | ORAL | Status: AC
  Administered 2023-01-21: 10 mg via ORAL
  Filled 2023-01-21: qty 1

## 2023-01-21 MED ORDER — SODIUM CHLORIDE 0.9 % IV SOLN: INTRAVENOUS | Status: DC

## 2023-01-21 MED ORDER — SODIUM CHLORIDE 0.9% FLUSH
10.0000 mL | Freq: Two times a day (BID) | INTRAVENOUS | Status: DC
Start: 2023-01-21 — End: 2023-01-21

## 2023-01-21 MED ORDER — SODIUM CHLORIDE 0.9 % IV SOLN
510.0000 mg | Freq: Once | INTRAVENOUS | Status: AC
  Administered 2023-01-21: 510 mg via INTRAVENOUS
  Filled 2023-01-21: qty 510

## 2023-01-21 MED ORDER — ACETAMINOPHEN 325 MG PO TABS
650.0000 mg | ORAL_TABLET | Freq: Once | ORAL | Status: AC
  Administered 2023-01-21: 650 mg via ORAL
  Filled 2023-01-21: qty 2

## 2023-01-21 NOTE — Patient Instructions (Signed)
[facility] [place] - [facility]  Discharge Instructions: Thank you for choosing [facility] to provide your oncology and hematology care.  If you have a lab appointment with the Cancer Center - please note that after April 8th, 2024, all labs will be drawn in the cancer center.  You do not have to check in or register with the main entrance as you have in the past but will complete your check-in in the cancer center.  Wear comfortable clothing and clothing appropriate for easy access to any Portacath or PICC line.   We strive to give you quality time with your provider. You may need to reschedule your appointment if you arrive late (15 or more minutes).  Arriving late affects you and other patients whose appointments are after yours.  Also, if you miss three or more appointments without notifying the office, you may be dismissed from the clinic at the provider's discretion.      For prescription refill requests, have your pharmacy contact our office and allow 72 hours for refills to be completed.    Today you received the following iron infusion, feraheme   To help prevent nausea and vomiting after your treatment, we encourage you to take your nausea medication as directed.  BELOW ARE SYMPTOMS THAT SHOULD BE REPORTED IMMEDIATELY: *FEVER GREATER THAN 100.4 F (38 C) OR HIGHER *CHILLS OR SWEATING *NAUSEA AND VOMITING THAT IS NOT CONTROLLED WITH YOUR NAUSEA MEDICATION *UNUSUAL SHORTNESS OF BREATH *UNUSUAL BRUISING OR BLEEDING *URINARY PROBLEMS (pain or burning when urinating, or frequent urination) *BOWEL PROBLEMS (unusual diarrhea, constipation, pain near the anus) TENDERNESS IN MOUTH AND THROAT WITH OR WITHOUT PRESENCE OF ULCERS (sore throat, sores in mouth, or a toothache) UNUSUAL RASH, SWELLING OR PAIN  UNUSUAL VAGINAL DISCHARGE OR ITCHING   Items with * indicate a potential emergency and should be followed up as soon as possible or go to  the Emergency Department if any problems should occur.  Please show the CHEMOTHERAPY ALERT CARD or IMMUNOTHERAPY ALERT CARD at check-in to the Emergency Department and triage nurse.  Should you have questions after your visit or need to cancel or reschedule your appointment, please contact [facility] [place] - A DEPT OF [facility] [phone]  and follow the prompts.  Office hours are 8:00 a.m. to 4:30 p.m. Monday - Friday. Please note that voicemails left after 4:00 p.m. may not be returned until the following business day.  We are closed weekends and major holidays. You have access to a nurse at all times for urgent questions. Please call the main number to the clinic [phone] and follow the prompts.  For any non-urgent questions, you may also contact your provider using MyChart. We now offer e-Visits for anyone 60 and older to request care online for non-urgent symptoms. For details visit [email or web address].   Also download the MyChart app! Go to the app store, search "MyChart", open the app, select [place], and log in with your MyChart username and password.

## 2023-01-21 NOTE — Progress Notes (Signed)
Iron infusion given per orders. Patient tolerated it well without problems. Vitals stable and discharged home from clinic ambulatory. Follow up as scheduled.  

## 2023-01-24 DIAGNOSIS — I70213 Atherosclerosis of native arteries of extremities with intermittent claudication, bilateral legs: Secondary | ICD-10-CM | POA: Diagnosis not present

## 2023-01-25 DIAGNOSIS — H9113 Presbycusis, bilateral: Secondary | ICD-10-CM | POA: Diagnosis not present

## 2023-01-25 DIAGNOSIS — R42 Dizziness and giddiness: Secondary | ICD-10-CM | POA: Diagnosis not present

## 2023-02-01 NOTE — Progress Notes (Unsigned)
[facility] [address], [place] [address]   CLINIC:  Medical Oncology/Hematology  PCP:  Kirstie Peri, MD [address] [place] [address] [phone]   REASON FOR VISIT:  Follow-up for pancytopenia  PRIOR THERAPY: Blood transfusions  CURRENT THERAPY: Intermittent IV iron + Retacrit  INTERVAL HISTORY:   Jeanette Chang 82 y.o. 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  She was last seen by Rojelio Brenner PA-C on 11/03/2022.  Patient's family contacted Cancer Center in November due to patient experiencing worsening fatigue.  Labs showed iron deficiency with ferritin 25 and iron saturation 17%, therefore patient was scheduled for IV Feraheme x 2, which she received on 01/10/2023 and 01/21/2023.  ***  Patient reports feeling *** after IV Feraheme. *** She continues to tolerate her Retacrit injections reasonably well, but does report some increased bone pain in her legs after injections. *** She denies any symptoms concerning for DVT/PE, and her blood pressure today is ***137/47. *** She has ongoing fatigue, ice pica, and leg cramping. *** She has shortness of breath on exertion, headaches, and some lightheadedness with standing.   No chest pain or syncopal episodes. *** She has not had any recent infections, B symptoms, or lymphadenopathy.  She has not noticed any recent bleeding (last episode of rectal bleeding was in early June).  *** *** She had epistaxis x 2 this month, with moderately heavy bleeding that lasted less than 5 minutes. *** No melena or hematemesis. *** She continues to bruise easily in her extremities, as well as ongoing abdominal bruising at the locations of her insulin injections. 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 Levon Hedger.  She has 50***% energy and 100***% appetite. She endorses that she is  maintaining a stable weight.  ASSESSMENT & PLAN:  1.   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  Normal duodenum. - Colonoscopy (06/29/2019): Perianal skin tags found on perianal exam.  2 small polyps at the splenic flexure.  External hemorrhoids. - BMBX (09/09/2020): Normocellular marrow (20%) with TLH, including minimal dyserythropoiesis and 1% blasts by manual aspirate differential.  Mild dyserythropoiesis which does not fulfill criteria for MDS.  Karyotype 45, X, -X[4]/46, XX[16], loss of one X chromosome is rare finding in the bone marrow is of older woman and when present as a minor clone (less than 50% of 20 metaphases) is thought to be most likely aging effect. - Labs from December 2023 showed normal B12, folate, copper, SPEP.  CKD stage II/IIIa based on creatinine (CKD stage IIIb based on elevated Cystatin C 1.78) - History of PRBC transfusions x 2 in October 2023 - Most recent IV iron with Feraheme x 2 in November/December 2024 *** - Started on Retacrit 08/11/2022 - current dose 10,000 units every 2 weeks - Labs today (02/02/2023): *** - Intermittent hematochezia thought to be from bleeding hemorrhoids. ***  No melena. - PLAN: *** TBD *** continue Retacrit 10,000 units every 2 weeks in the setting of anemia of CKD as well as dyserythropoiesis noted on BMBX from July 2022. - Indication for IV iron at this time - CBC/differential + BB sample every 2 weeks - Recheck CBC/D, CMP, and iron panel with RTC in 3 months - If any severe derangements beyond  baseline cytopenias, would consider repeat bone marrow biopsy  2.  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  Normal duodenum. - Colonoscopy (06/29/2019): Perianal skin tags found on  perianal exam.  2 small polyps at the splenic flexure.  External hemorrhoids. - BMBX (09/09/2020): Normocellular marrow (20%) with TLH, including minimal dyserythropoiesis and 1% blasts by manual aspirate differential.  Mild dyserythropoiesis which does not fulfill criteria for MDS.  Karyotype 45, X, -X[4]/46, XX[16], loss of 1 X chromosome is rare finding in the bone marrow is of older woman and when present as a minor clone (less than 50% of 20 metaphases) is thought to be most likely aging effect. - CT [facility] (04/24/2021): Spleen enlarged measuring 14.1 cm with volume 632 mL.  Liver has nodular contour consistent with cirrhosis. - Labs from December 2023 showed normal B12 of, folate, copper, SPEP.  CKD stage IIIa/b. - No B symptoms or recent infections. ***She has easy bruising.  ***Reports epistaxis x2 this month*** - Most recent labs (02/02/2023): WBC ***[name] ***, platelets *** - PLAN: We will continue active surveillance.  Blood counts overall at baseline. - If any severe derangements beyond baseline cytopenias, would consider repeat bone marrow biopsy  3.  Easy bruising - Per patient report, PCP had concerns regarding her easy bruising - Additional labs were checked on 08/04/2022, which showed normal fibrinogen, normal APTT, and normal INR.  Von Willebrand panel was not consistent with diagnosis of von Willebrand's disease. 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  4.  Social/family history: - Other PMH includes NASH cirrhosis, diabetes, arthritis, carotid artery disease her daughter lives with her at home.  She is independent of ADLs and IADLs.  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  Quit smoking in November 1992. - Maternal grandmother had low platelets.  Sister had low platelet. - Mother had colon cancer.  Sister had stomach cancer.  Paternal uncle had lung cancer.   PLAN SUMMARY: *** TBD *** >> CBC/BB sample + Retacrit every 2  weeks >> Labs in 3 months = CBC/D, CMP, ferritin, iron/TIBC >> OFFICE visit in 3 months (2 weeks after labs)     REVIEW OF SYSTEMS:***  Review of Systems  Constitutional:  Positive for fatigue. Negative for appetite change, chills, diaphoresis, fever and unexpected weight change.  HENT:   Negative for lump/mass and nosebleeds.   Eyes:  Negative for eye problems.  Respiratory:  Positive for shortness of breath (with exertion). Negative for cough and hemoptysis.   Cardiovascular:  Negative for chest pain, leg swelling and palpitations.  Gastrointestinal:  Positive for nausea. Negative for abdominal pain, blood in stool, constipation, diarrhea and vomiting.  Genitourinary:  Negative for hematuria.   Musculoskeletal:  Positive for arthralgias.  Skin:  Negative for itching.  Neurological:  Positive for dizziness (vertigo) and headaches. Negative for light-headedness and numbness.  Hematological:  Bruises/bleeds easily.  Psychiatric/Behavioral:  Positive for sleep disturbance. Negative for depression. The patient is not nervous/anxious.      PHYSICAL EXAM:  ECOG PERFORMANCE STATUS: 1 - Symptomatic but completely ambulatory  There were no vitals filed for this visit. ***  There were no vitals filed for this visit.   Physical Exam Constitutional:      Appearance: Normal appearance. She is normal weight.     Comments: Weak-appearing  Cardiovascular:     Heart sounds: Normal  heart sounds.  Pulmonary:     Breath sounds: Normal breath sounds.  Skin:    Findings: Bruising present.  Neurological:     General: No focal deficit present.     Mental Status: Mental status is at baseline.  Psychiatric:        Behavior: Behavior normal. Behavior is cooperative.   PAST MEDICAL/SURGICAL HISTORY:  Past Medical History:  Diagnosis Date   Abnormal LFTs    Achilles bursitis or tendinitis    Acute maxillary sinusitis    Acute pharyngitis    Anemia, unspecified    Candidiasis of skin and  nails    Dermatophytosis of foot    Dermatophytosis of the body    Diverticulitis of colon (without mention of hemorrhage)(562.11)    Edema    Headache(784.0)    HOH (hard of hearing)    Malaise and fatigue    Occlusion and stenosis of carotid artery without mention of cerebral infarction    Osteoarthrosis, unspecified whether generalized or localized, unspecified site    Other dyspnea and respiratory abnormality    Other psoriasis    Other specified disorders of rotator cuff syndrome of shoulder and allied disorders    Pain in joint    Pure hypercholesterolemia    RUQ pain    Spasm of back muscles    Thrombocytopenia, unspecified (HCC)    Type II or unspecified type diabetes mellitus without mention of complication, not stated as uncontrolled    Unspecified essential hypertension    Unspecified sinusitis (chronic)    Urinary incontinence    Past Surgical History:  Procedure Laterality Date   APPENDECTOMY  1964   BIOPSY  03/02/2017   Procedure: BIOPSY;  Surgeon: Malissa Hippo, MD;  [facility];  Service: Endoscopy;;  antral   BIOPSY  06/17/2020   Procedure: BIOPSY;  Surgeon: Dolores Frame, MD;  [facility];  Service: Gastroenterology;;   CAROTID ENDARTERECTOMY  2005   Left CEA   CAROTID ENDARTERECTOMY  2009   Right CEA   CATARACT EXTRACTION, BILATERAL Bilateral    CHOLECYSTECTOMY  1982   Gall Bladder   COLONOSCOPY  09   COLONOSCOPY N/A 06/27/2019   Procedure: COLONOSCOPY;  Surgeon: Malissa Hippo, MD;  [facility];  Service: Endoscopy;  Laterality: N/A;   ESOPHAGOGASTRODUODENOSCOPY N/A 03/02/2017   Procedure: ESOPHAGOGASTRODUODENOSCOPY (EGD);  Surgeon: Malissa Hippo, MD;  [facility];  Service: Endoscopy;  Laterality: N/A;  12:55   ESOPHAGOGASTRODUODENOSCOPY N/A 06/27/2019   Procedure: ESOPHAGOGASTRODUODENOSCOPY (EGD);  Surgeon: Malissa Hippo, MD;  [facility];  Service: Endoscopy;  Laterality:  N/A;  730   ESOPHAGOGASTRODUODENOSCOPY (EGD) WITH PROPOFOL N/A 06/17/2020   Procedure: ESOPHAGOGASTRODUODENOSCOPY (EGD) WITH PROPOFOL;  Surgeon: Dolores Frame, MD;  [facility];  Service: Gastroenterology;  Laterality: N/A;  AM   GIVENS CAPSULE STUDY N/A 10/24/2019   Procedure: GIVENS CAPSULE STUDY;  Surgeon: Malissa Hippo, MD;  [facility];  Service: Endoscopy;  Laterality: N/A;  730   KNEE ARTHROSCOPY WITH MEDIAL MENISECTOMY Left 07/10/2019   Procedure: KNEE ARTHROSCOPY WITH MEDIAL MENISCECTOMY;  Surgeon: Vickki Hearing, MD;  [facility]: [facility];  Service: Orthopedics;  Laterality: Left;   KNEE ARTHROSCOPY WITH MEDIAL MENISECTOMY Left 03/14/2020   Procedure: KNEE ARTHROSCOPY WITH MEDIAL MENISCECTOMY;  Surgeon: Vickki Hearing, MD;  [facility]: [facility];  Service: Orthopedics;  Laterality: Left;   NOSE SURGERY  1978   PARATHYROIDECTOMY  1992  POLYPECTOMY  03/02/2017   Procedure: POLYPECTOMY;  Surgeon: Malissa Hippo, MD;  [facility];  Service: Endoscopy;;  gastric    POLYPECTOMY  06/27/2019   Procedure: POLYPECTOMY;  Surgeon: Malissa Hippo, MD;  [facility];  Service: Endoscopy;;  colon   Power port  03/06/2009   Right upper chest    TONSILLECTOMY     TUMOR EXCISION Right August 24, 2013   [facility] Dr. Haynes Hoehn, ENT   VAGINAL HYSTERECTOMY  [phone]    SOCIAL HISTORY:  Social History   Socioeconomic History   Marital status: Widowed    Spouse name: Not on file   Number of children: Not on file   Years of education: Not on file   Highest education level: Not on file  Occupational History   Not on file  Tobacco Use   Smoking status: Former    Current packs/day: 0.00    Average packs/day: 1 pack/day for 42.0 years (42.0 ttl pk-yrs)    Types: Cigarettes    Start date: 03/12/1950    Quit date: 03/12/1992    Years since quitting: 30.9    Passive exposure: Past   Smokeless tobacco: Former    Quit  date: 12/16/1992  Vaping Use   Vaping status: Never Used  Substance and Sexual Activity   Alcohol use: Yes    Alcohol/week: 1.0 standard drink of alcohol    Types: 1 Glasses of wine per week    Comment: very seldom   Drug use: No   Sexual activity: Not Currently  Other Topics Concern   Not on file  Social History Narrative   Not on file   Social Drivers of Health   Financial Resource Strain: Low Risk  (04/07/2021)   Received from [facility], [facility]   Overall Financial Resource Strain (CARDIA)    Difficulty of Paying Living Expenses: Not hard at all  Food Insecurity: Low Risk  (01/25/2023)   Received from [facility]   Hunger Vital Sign    Worried About Running Out of Food in the Last Year: Never true    Ran Out of Food in the Last Year: Never true  Transportation Needs: No Transportation Needs (01/25/2023)   Received from [facility]    In the past 12 months, has lack of reliable transportation kept you from medical appointments, meetings, work or from getting things needed for daily living? : No  Physical Activity: Inactive (04/07/2021)   Received from [facility], [facility]   Exercise Vital Sign    Days of Exercise per Week: 0 days    Minutes of Exercise per Session: 0 min  Stress: No Stress Concern Present (04/07/2021)   Received from [facility], [facility] Occupational [facility] - Occupational Stress Questionnaire    Feeling of Stress : Not at all  Social Connections: Moderately Isolated (04/07/2021)   Received from [facility], [facility]   Social Connection and Isolation Panel [NHANES]    Frequency of Communication with Friends and Family: More than three times a week    Frequency of Social Gatherings with Friends and Family: Twice a week    Attends Religious Services: Never    Database administrator or Organizations: No    Attends Banker Meetings: Never    Marital Status:  Married  Catering manager Violence: Not At Risk (04/07/2021)  Received from [facility], [facility]   Humiliation, Afraid, Rape, and Kick questionnaire    Fear of Current or Ex-Partner: No    Emotionally Abused: No    Physically Abused: No    Sexually Abused: No    FAMILY HISTORY:  Family History  Problem Relation Age of Onset   Heart attack Mother    Cancer Mother    Heart attack Father    Cancer Sister     CURRENT MEDICATIONS:  Outpatient Encounter Medications as of 02/02/2023  Medication Sig Note   acetaminophen (TYLENOL) 500 MG tablet Take 1 tablet (500 mg total) by mouth every 6 (six) hours as needed for moderate pain or headache.    aspirin EC 81 MG tablet Take 81 mg by mouth every other day. AT NIGHT.    B-D UF III MINI PEN NEEDLES 31G X 5 MM MISC SMARTSIG:1 Pen Needle SUB-Q Daily    Biotin 10 MG CAPS Take by mouth daily at 6 (six) AM.    carvedilol (COREG) 6.25 MG tablet Take 1 tablet (6.25 mg total) by mouth 2 (two) times daily with a meal.    cholecalciferol (VITAMIN D) 25 MCG (1000 UNIT) tablet Take 1,000 Units by mouth daily.    dicyclomine (BENTYL) 10 MG capsule TAKE 1 CAPSULE (10 MG TOTAL) BY MOUTH 3 (THREE) TIMES DAILY BEFORE MEALS.    furosemide (LASIX) 20 MG tablet Take 20 mg by mouth as needed.    gabapentin (NEURONTIN) 300 MG capsule Take 300 mg by mouth at bedtime.    HYDROcodone-acetaminophen (NORCO/VICODIN) 5-325 MG tablet Take 1 tablet by mouth daily as needed.    levofloxacin (LEVAQUIN) 500 MG tablet Take 500 mg by mouth daily.    losartan (COZAAR) 25 MG tablet Take 25 mg by mouth daily.    meclizine (ANTIVERT) 25 MG tablet Take 25 mg by mouth 3 (three) times daily as needed.    metFORMIN (GLUCOPHAGE) 500 MG tablet Take 500 mg by mouth 2 (two) times daily with a meal.    Multiple Vitamins-Minerals (EYE VITAMINS PO) Take by mouth. Red-2 for eyes    ONETOUCH ULTRA test strip 1 (ONE) STRIP DAILY,DM2,E11.65    OVER THE COUNTER MEDICATION Iron as  needed. Not daily    OVER THE COUNTER MEDICATION Compounded Hemorrhoid cream [facility]) Apply rectally up to four times per day. 04/26/2022: prn   traMADol (ULTRAM) 50 MG tablet Take 50 mg by mouth at bedtime.    TRESIBA FLEXTOUCH 100 UNIT/ML FlexTouch Pen SMARTSIG:10 Unit(s) SUB-Q Daily    Facility-Administered Encounter Medications as of 02/02/2023  Medication   sodium chloride irrigation 0.9 %    ALLERGIES:  Allergies  Allergen Reactions   Codeine Nausea And Vomiting and Rash   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   Toviaz [Fesoterodine Fumarate] Other (See Comments)  Cramps, severe headache   Norco [Hydrocodone-Acetaminophen] Nausea And Vomiting and Anxiety   Penicillins Swelling, Rash and Other (See Comments)    Has patient had a PCN reaction causing immediate rash, facial/tongue/throat swelling, SOB or lightheadedness with hypotension: No Has patient had a PCN reaction causing severe rash involving mucus membranes or skin necrosis: No Has patient had a PCN reaction that required hospitalization: Yes Has patient had a PCN reaction occurring within the last 10 years: No If all of the above answers are "NO", then may proceed with Cephalosporin use.    Ultram [Tramadol Hcl] Nausea And Vomiting    LABORATORY DATA:  I have reviewed the labs as listed.  CBC    Component Value Date/Time    WBC 3.6 (L) 01/12/2023 0945   RBC 2.97 (L) 01/12/2023 0945   HGB 8.9 (L) 01/12/2023 0945   HCT 29.8 (L) 01/12/2023 0945   PLT 62 (L) 01/12/2023 0945   MCV 100.3 (H) 01/12/2023 0945   MCH 30.0 01/12/2023 0945   MCHC 29.9 (L) 01/12/2023 0945   RDW 13.9 01/12/2023 0945   LYMPHSABS 0.8 11/03/2022 1334   MONOABS 0.1 11/03/2022 1334   EOSABS 0.1 11/03/2022 1334   BASOSABS 0.0 11/03/2022 1334      Latest Ref Rng & Units 11/03/2022    1:34 PM 09/08/2022    9:17 AM 04/26/2022    1:27 PM  CMP  Glucose 70 - 99 mg/dL 378  588  502   BUN 8 - 23 mg/dL 43  31  24   Creatinine 0.44 - 1.00 mg/dL 7.74  1.28  7.86   Sodium 135 - 145 mmol/L 141  138  144   Potassium 3.5 - 5.1 mmol/L 4.4  4.5  4.2   Chloride 98 - 111 mmol/L 106  106  107   CO2 22 - 32 mmol/L 24  25  26    Calcium 8.9 - 10.3 mg/dL 8.9  9.1  9.5   Total Protein 6.5 - 8.1 g/dL 6.8  6.9  6.6   Total Bilirubin 0.3 - 1.2 mg/dL 0.7  0.7  0.5   Alkaline Phos 38 - 126 U/L 97  102    AST 15 - 41 U/L 31  42  36   ALT 0 - 44 U/L 27  40  30     DIAGNOSTIC IMAGING:  I have independently reviewed the relevant imaging and discussed with the patient.   WRAP UP:  All questions were answered. The patient knows to call the clinic with any problems, questions or concerns.  Medical decision making: Moderate  Time spent on visit: I spent 20 minutes counseling the patient face to face. The total time spent in the appointment was 30 minutes and more than 50% was on counseling.  Carnella Guadalajara, PA-C  ***

## 2023-02-02 ENCOUNTER — Inpatient Hospital Stay: Payer: Medicare Other

## 2023-02-02 ENCOUNTER — Inpatient Hospital Stay (HOSPITAL_BASED_OUTPATIENT_CLINIC_OR_DEPARTMENT_OTHER): Payer: Medicare Other | Admitting: Physician Assistant

## 2023-02-02 VITALS — BP 170/57 | HR 69 | Temp 98.2°F | Resp 18 | Ht 63.0 in | Wt 144.6 lb

## 2023-02-02 DIAGNOSIS — D631 Anemia in chronic kidney disease: Secondary | ICD-10-CM

## 2023-02-02 DIAGNOSIS — D696 Thrombocytopenia, unspecified: Secondary | ICD-10-CM

## 2023-02-02 DIAGNOSIS — D509 Iron deficiency anemia, unspecified: Secondary | ICD-10-CM | POA: Diagnosis not present

## 2023-02-02 DIAGNOSIS — D500 Iron deficiency anemia secondary to blood loss (chronic): Secondary | ICD-10-CM | POA: Diagnosis not present

## 2023-02-02 DIAGNOSIS — D72818 Other decreased white blood cell count: Secondary | ICD-10-CM | POA: Diagnosis not present

## 2023-02-02 DIAGNOSIS — N1832 Chronic kidney disease, stage 3b: Secondary | ICD-10-CM

## 2023-02-02 LAB — CBC WITH DIFFERENTIAL/PLATELET
Abs Immature Granulocytes: 0.01 10*3/uL (ref 0.00–0.07)
Basophils Absolute: 0 10*3/uL (ref 0.0–0.1)
Basophils Relative: 1 %
Eosinophils Absolute: 0 10*3/uL (ref 0.0–0.5)
Eosinophils Relative: 2 %
HCT: 35.1 % — ABNORMAL LOW (ref 36.0–46.0)
Hemoglobin: 10.8 g/dL — ABNORMAL LOW (ref 12.0–15.0)
Immature Granulocytes: 1 %
Lymphocytes Relative: 36 %
Lymphs Abs: 0.7 10*3/uL (ref 0.7–4.0)
MCH: 31.8 pg (ref 26.0–34.0)
MCHC: 30.8 g/dL (ref 30.0–36.0)
MCV: 103.2 fL — ABNORMAL HIGH (ref 80.0–100.0)
Monocytes Absolute: 0.2 10*3/uL (ref 0.1–1.0)
Monocytes Relative: 8 %
Neutro Abs: 1 10*3/uL — ABNORMAL LOW (ref 1.7–7.7)
Neutrophils Relative %: 52 %
Platelets: 48 10*3/uL — ABNORMAL LOW (ref 150–400)
RBC: 3.4 MIL/uL — ABNORMAL LOW (ref 3.87–5.11)
RDW: 17.2 % — ABNORMAL HIGH (ref 11.5–15.5)
WBC: 1.8 10*3/uL — ABNORMAL LOW (ref 4.0–10.5)
nRBC: 0 % (ref 0.0–0.2)

## 2023-02-02 LAB — COMPREHENSIVE METABOLIC PANEL
ALT: 41 U/L (ref 0–44)
AST: 45 U/L — ABNORMAL HIGH (ref 15–41)
Albumin: 3.4 g/dL — ABNORMAL LOW (ref 3.5–5.0)
Alkaline Phosphatase: 116 U/L (ref 38–126)
Anion gap: 7 (ref 5–15)
BUN: 27 mg/dL — ABNORMAL HIGH (ref 8–23)
CO2: 25 mmol/L (ref 22–32)
Calcium: 9.6 mg/dL (ref 8.9–10.3)
Chloride: 105 mmol/L (ref 98–111)
Creatinine, Ser: 0.97 mg/dL (ref 0.44–1.00)
GFR, Estimated: 58 mL/min — ABNORMAL LOW (ref >60)
Glucose, Bld: 224 mg/dL — ABNORMAL HIGH (ref 70–99)
Potassium: 4.3 mmol/L (ref 3.5–5.1)
Sodium: 137 mmol/L (ref 135–145)
Total Bilirubin: 0.6 mg/dL (ref <1.2)
Total Protein: 6.8 g/dL (ref 6.5–8.1)

## 2023-02-02 LAB — IRON AND TIBC
Iron: 93 ug/dL (ref 28–170)
Saturation Ratios: 26 % (ref 10.4–31.8)
TIBC: 360 ug/dL (ref 250–450)
UIBC: 267 ug/dL

## 2023-02-02 LAB — SAMPLE TO BLOOD BANK

## 2023-02-02 LAB — FERRITIN: Ferritin: 356 ng/mL — ABNORMAL HIGH (ref 11–307)

## 2023-02-02 MED ORDER — EPOETIN ALFA-EPBX 10000 UNIT/ML IJ SOLN
10000.0000 [IU] | Freq: Once | INTRAMUSCULAR | Status: AC
  Administered 2023-02-02: 10000 [IU] via SUBCUTANEOUS
  Filled 2023-02-02: qty 1

## 2023-02-02 NOTE — Progress Notes (Signed)
Presents today for Retacrit injection per providers order.  Vital signs and labs reviewed by PA.  Message received form Rojelio Brenner PA, patient okay for injection.

## 2023-02-02 NOTE — Patient Instructions (Addendum)
[facility] at [facility] **VISIT SUMMARY & IMPORTANT INSTRUCTIONS **   You were seen today by Rojelio Brenner PA-C for your follow-up visit.    ANEMIA DUE TO IRON DEFICIENCY & CHRONIC KIDNEY DISEASE Your blood levels look much better after your most recent IV iron! You most likely have some mild ongoing blood loss from your stomach and intestines which causes your blood and iron levels to drift downward over time.  Please seek immediate medical attention if you notice any black tarry bowel movements or large amounts of bright red blood in the toilet, or have any bright red blood in your vomit or vomit that looks like coffee grounds. You do not need any IV iron at this time. Continue RETACRIT (epoetin alfa injection) to help your body make more blood cells. We will continue to check your blood counts every 2 weeks on the same day that you receive the Retacrit injection.  LOW PLATELETS & WHITE BLOOD CELLS Your low platelets and low white blood cells remain low, but are overall stable. 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 The other labs that we checked did not show any other reason for you to have any severe bruising.  Your bruising is most likely related to your low platelets.  FOLLOW-UP APPOINTMENT: Office visit in 3 months  ** Thank you for trusting me with your healthcare!  I strive to provide all of my patients with quality care at each visit.  If you receive a survey for this visit, I would be so grateful to you for taking the time to provide feedback.  Thank you in advance!  ~ Zamirah Denny                   Dr. Doreatha Massed   &   Rojelio Brenner, PA-C   - - - - - - - - - - - - - - - - - -    Thank you for choosing [facility] at [facility] to provide your oncology and hematology care.  To afford each  patient quality time with our provider, please arrive at least 15 minutes before your scheduled appointment time.   If you have a lab appointment with the Cancer Center please come in thru the Main Entrance and check in at the main information desk.  You need to re-schedule your appointment should you arrive 10 or more minutes late.  We strive to give you quality time with our providers, and arriving late affects you and other patients whose appointments are after yours.  Also, if you no show three or more times for appointments you may be dismissed from the clinic at the providers discretion.     Again, thank you for choosing [facility].  Our hope is that these requests will decrease the amount of time that you wait before being seen by our physicians.       _____________________________________________________________  Should you have questions after your visit to [facility] [place], please contact our office at [phone] and follow the prompts.  Our office hours are 8:00 a.m. and 4:30 p.m. Monday - Friday.  Please note that voicemails left after 4:00 p.m. may not be returned until the following business day.  We are closed weekends and major holidays.  You do have access to a  nurse 24-7, just call the main number to the clinic [phone] and do not press any options, hold on the line and a nurse will answer the phone.    For prescription refill requests, have your pharmacy contact our office and allow 72 hours.

## 2023-02-02 NOTE — Patient Instructions (Signed)
 [facility] [place] - [facility]  Discharge Instructions: Thank you for choosing [facility] to provide your oncology and hematology care.  If you have a lab appointment with the Cancer Center - please note that after April 8th, 2024, all labs will be drawn in the cancer center.  You do not have to check in or register with the main entrance as you have in the past but will complete your check-in in the cancer center.  Wear comfortable clothing and clothing appropriate for easy access to any Portacath or PICC line.   We strive to give you quality time with your provider. You may need to reschedule your appointment if you arrive late (15 or more minutes).  Arriving late affects you and other patients whose appointments are after yours.  Also, if you miss three or more appointments without notifying the office, you may be dismissed from the clinic at the provider's discretion.      For prescription refill requests, have your pharmacy contact our office and allow 72 hours for refills to be completed.    Today you received the following chemotherapy and/or immunotherapy agents Retacrit      To help prevent nausea and vomiting after your treatment, we encourage you to take your nausea medication as directed.  BELOW ARE SYMPTOMS THAT SHOULD BE REPORTED IMMEDIATELY: *FEVER GREATER THAN 100.4 F (38 C) OR HIGHER *CHILLS OR SWEATING *NAUSEA AND VOMITING THAT IS NOT CONTROLLED WITH YOUR NAUSEA MEDICATION *UNUSUAL SHORTNESS OF BREATH *UNUSUAL BRUISING OR BLEEDING *URINARY PROBLEMS (pain or burning when urinating, or frequent urination) *BOWEL PROBLEMS (unusual diarrhea, constipation, pain near the anus) TENDERNESS IN MOUTH AND THROAT WITH OR WITHOUT PRESENCE OF ULCERS (sore throat, sores in mouth, or a toothache) UNUSUAL RASH, SWELLING OR PAIN  UNUSUAL VAGINAL DISCHARGE OR ITCHING   Items with * indicate a potential emergency and should be followed up  as soon as possible or go to the Emergency Department if any problems should occur.  Please show the CHEMOTHERAPY ALERT CARD or IMMUNOTHERAPY ALERT CARD at check-in to the Emergency Department and triage nurse.  Should you have questions after your visit or need to cancel or reschedule your appointment, please contact [facility] [place] - A DEPT OF Eligha Bridegroom [facility] [phone]  and follow the prompts.  Office hours are 8:00 a.m. to 4:30 p.m. Monday - Friday. Please note that voicemails left after 4:00 p.m. may not be returned until the following business day.  We are closed weekends and major holidays. You have access to a nurse at all times for urgent questions. Please call the main number to the clinic [phone] and follow the prompts.  For any non-urgent questions, you may also contact your provider using MyChart. We now offer e-Visits for anyone 31 and older to request care online for non-urgent symptoms. For details visit [email or web address].   Also download the MyChart app! Go to the app store, search "MyChart", open the app, select [place], and log in with your MyChart username and password.

## 2023-02-11 DIAGNOSIS — Z299 Encounter for prophylactic measures, unspecified: Secondary | ICD-10-CM | POA: Diagnosis not present

## 2023-02-11 DIAGNOSIS — M79606 Pain in leg, unspecified: Secondary | ICD-10-CM | POA: Diagnosis not present

## 2023-02-11 DIAGNOSIS — I10 Essential (primary) hypertension: Secondary | ICD-10-CM | POA: Diagnosis not present

## 2023-02-11 DIAGNOSIS — M1712 Unilateral primary osteoarthritis, left knee: Secondary | ICD-10-CM | POA: Diagnosis not present

## 2023-02-15 ENCOUNTER — Inpatient Hospital Stay: Payer: Medicare Other

## 2023-02-15 DIAGNOSIS — D500 Iron deficiency anemia secondary to blood loss (chronic): Secondary | ICD-10-CM | POA: Diagnosis not present

## 2023-02-15 DIAGNOSIS — D631 Anemia in chronic kidney disease: Secondary | ICD-10-CM

## 2023-02-15 DIAGNOSIS — D696 Thrombocytopenia, unspecified: Secondary | ICD-10-CM

## 2023-02-15 DIAGNOSIS — N1832 Chronic kidney disease, stage 3b: Secondary | ICD-10-CM | POA: Diagnosis not present

## 2023-02-15 LAB — CBC
HCT: 39.3 % (ref 36.0–46.0)
Hemoglobin: 12.2 g/dL (ref 12.0–15.0)
MCH: 31.5 pg (ref 26.0–34.0)
MCHC: 31 g/dL (ref 30.0–36.0)
MCV: 101.6 fL — ABNORMAL HIGH (ref 80.0–100.0)
Platelets: 53 10*3/uL — ABNORMAL LOW (ref 150–400)
RBC: 3.87 MIL/uL (ref 3.87–5.11)
RDW: 16 % — ABNORMAL HIGH (ref 11.5–15.5)
WBC: 3.1 10*3/uL — ABNORMAL LOW (ref 4.0–10.5)
nRBC: 0 % (ref 0.0–0.2)

## 2023-02-15 LAB — SAMPLE TO BLOOD BANK

## 2023-02-15 NOTE — Progress Notes (Signed)
No injection needed today hemoglobin 12.2 patient made aware and discharged in satisfactory condition.

## 2023-02-18 ENCOUNTER — Other Ambulatory Visit (INDEPENDENT_AMBULATORY_CARE_PROVIDER_SITE_OTHER): Payer: Self-pay | Admitting: Gastroenterology

## 2023-02-18 DIAGNOSIS — R1030 Lower abdominal pain, unspecified: Secondary | ICD-10-CM

## 2023-03-02 ENCOUNTER — Inpatient Hospital Stay: Payer: Medicare Other | Attending: Hematology

## 2023-03-02 ENCOUNTER — Inpatient Hospital Stay: Payer: Medicare Other

## 2023-03-02 DIAGNOSIS — D696 Thrombocytopenia, unspecified: Secondary | ICD-10-CM

## 2023-03-02 DIAGNOSIS — N1832 Chronic kidney disease, stage 3b: Secondary | ICD-10-CM | POA: Insufficient documentation

## 2023-03-02 DIAGNOSIS — D631 Anemia in chronic kidney disease: Secondary | ICD-10-CM | POA: Insufficient documentation

## 2023-03-02 DIAGNOSIS — N1831 Chronic kidney disease, stage 3a: Secondary | ICD-10-CM

## 2023-03-02 DIAGNOSIS — D500 Iron deficiency anemia secondary to blood loss (chronic): Secondary | ICD-10-CM

## 2023-03-02 LAB — CBC
HCT: 37.2 % (ref 36.0–46.0)
Hemoglobin: 11.8 g/dL — ABNORMAL LOW (ref 12.0–15.0)
MCH: 32.1 pg (ref 26.0–34.0)
MCHC: 31.7 g/dL (ref 30.0–36.0)
MCV: 101.1 fL — ABNORMAL HIGH (ref 80.0–100.0)
Platelets: 36 10*3/uL — ABNORMAL LOW (ref 150–400)
RBC: 3.68 MIL/uL — ABNORMAL LOW (ref 3.87–5.11)
RDW: 14.9 % (ref 11.5–15.5)
WBC: 2.6 10*3/uL — ABNORMAL LOW (ref 4.0–10.5)
nRBC: 0 % (ref 0.0–0.2)

## 2023-03-02 LAB — SAMPLE TO BLOOD BANK

## 2023-03-02 NOTE — Progress Notes (Signed)
 HGB 11.8 today. No Retacrit  needed today per guidelines.

## 2023-03-09 DIAGNOSIS — Z87898 Personal history of other specified conditions: Secondary | ICD-10-CM | POA: Diagnosis not present

## 2023-03-09 DIAGNOSIS — H9113 Presbycusis, bilateral: Secondary | ICD-10-CM | POA: Diagnosis not present

## 2023-03-14 DIAGNOSIS — K7460 Unspecified cirrhosis of liver: Secondary | ICD-10-CM | POA: Diagnosis not present

## 2023-03-14 DIAGNOSIS — E1169 Type 2 diabetes mellitus with other specified complication: Secondary | ICD-10-CM | POA: Diagnosis not present

## 2023-03-14 DIAGNOSIS — D61818 Other pancytopenia: Secondary | ICD-10-CM | POA: Diagnosis not present

## 2023-03-14 DIAGNOSIS — I10 Essential (primary) hypertension: Secondary | ICD-10-CM | POA: Diagnosis not present

## 2023-03-14 DIAGNOSIS — Z299 Encounter for prophylactic measures, unspecified: Secondary | ICD-10-CM | POA: Diagnosis not present

## 2023-03-14 DIAGNOSIS — G8194 Hemiplegia, unspecified affecting left nondominant side: Secondary | ICD-10-CM | POA: Diagnosis not present

## 2023-03-16 ENCOUNTER — Other Ambulatory Visit: Payer: Self-pay | Admitting: Physician Assistant

## 2023-03-16 ENCOUNTER — Inpatient Hospital Stay: Payer: Medicare Other

## 2023-03-16 VITALS — BP 149/60 | HR 72 | Temp 98.2°F | Resp 18

## 2023-03-16 DIAGNOSIS — D509 Iron deficiency anemia, unspecified: Secondary | ICD-10-CM

## 2023-03-16 DIAGNOSIS — D696 Thrombocytopenia, unspecified: Secondary | ICD-10-CM

## 2023-03-16 DIAGNOSIS — N1832 Chronic kidney disease, stage 3b: Secondary | ICD-10-CM | POA: Diagnosis not present

## 2023-03-16 DIAGNOSIS — D631 Anemia in chronic kidney disease: Secondary | ICD-10-CM | POA: Diagnosis not present

## 2023-03-16 DIAGNOSIS — D500 Iron deficiency anemia secondary to blood loss (chronic): Secondary | ICD-10-CM

## 2023-03-16 DIAGNOSIS — N1831 Chronic kidney disease, stage 3a: Secondary | ICD-10-CM

## 2023-03-16 LAB — FERRITIN: Ferritin: 120 ng/mL (ref 11–307)

## 2023-03-16 LAB — CBC
HCT: 31.8 % — ABNORMAL LOW (ref 36.0–46.0)
Hemoglobin: 10.4 g/dL — ABNORMAL LOW (ref 12.0–15.0)
MCH: 32.6 pg (ref 26.0–34.0)
MCHC: 32.7 g/dL (ref 30.0–36.0)
MCV: 99.7 fL (ref 80.0–100.0)
Platelets: 42 10*3/uL — ABNORMAL LOW (ref 150–400)
RBC: 3.19 MIL/uL — ABNORMAL LOW (ref 3.87–5.11)
RDW: 15 % (ref 11.5–15.5)
WBC: 2.5 10*3/uL — ABNORMAL LOW (ref 4.0–10.5)
nRBC: 0 % (ref 0.0–0.2)

## 2023-03-16 LAB — IRON AND TIBC
Iron: 78 ug/dL (ref 28–170)
Saturation Ratios: 22 % (ref 10.4–31.8)
TIBC: 358 ug/dL (ref 250–450)
UIBC: 280 ug/dL

## 2023-03-16 LAB — SAMPLE TO BLOOD BANK

## 2023-03-16 MED ORDER — EPOETIN ALFA-EPBX 10000 UNIT/ML IJ SOLN
10000.0000 [IU] | Freq: Once | INTRAMUSCULAR | Status: AC
Start: 2023-03-16 — End: 2023-03-16
  Administered 2023-03-16: 10000 [IU] via SUBCUTANEOUS
  Filled 2023-03-16: qty 1

## 2023-03-16 NOTE — Patient Instructions (Signed)
[facility] [place] [facility]  Discharge Instructions: Thank you for choosing [facility] to provide your oncology and hematology care.  If you have a lab appointment with the Cancer Center - please note that after April 8th, 2024, all labs will be drawn in the cancer center.  You do not have to check in or register with the main entrance as you have in the past but will complete your check-in in the cancer center.  Wear comfortable clothing and clothing appropriate for easy access to any Portacath or PICC line.   We strive to give you quality time with your provider. You may need to reschedule your appointment if you arrive late (15 or more minutes).  Arriving late affects you and other patients whose appointments are after yours.  Also, if you miss three or more appointments without notifying the office, you may be dismissed from the clinic at the provider's discretion.      For prescription refill requests, have your pharmacy contact our office and allow 72 hours for refills to be completed.      To help prevent nausea and vomiting after your treatment, we encourage you to take your nausea medication as directed.  BELOW ARE SYMPTOMS THAT SHOULD BE REPORTED IMMEDIATELY: *FEVER GREATER THAN 100.4 F (38 C) OR HIGHER *CHILLS OR SWEATING *NAUSEA AND VOMITING THAT IS NOT CONTROLLED WITH YOUR NAUSEA MEDICATION *UNUSUAL SHORTNESS OF BREATH *UNUSUAL BRUISING OR BLEEDING *URINARY PROBLEMS (pain or burning when urinating, or frequent urination) *BOWEL PROBLEMS (unusual diarrhea, constipation, pain near the anus) TENDERNESS IN MOUTH AND THROAT WITH OR WITHOUT PRESENCE OF ULCERS (sore throat, sores in mouth, or a toothache) UNUSUAL RASH, SWELLING OR PAIN  UNUSUAL VAGINAL DISCHARGE OR ITCHING   Items with * indicate a potential emergency and should be followed up as soon as possible or go to the Emergency Department if any problems should  occur.  Please show the CHEMOTHERAPY ALERT CARD or IMMUNOTHERAPY ALERT CARD at check-in to the Emergency Department and triage nurse.  Should you have questions after your visit or need to cancel or reschedule your appointment, please contact [facility]  - A DEPT OF Eligha Bridegroom [facility] [phone]  and follow the prompts.  Office hours are 8:00 a.m. to 4:30 p.m. Monday - Friday. Please note that voicemails left after 4:00 p.m. may not be returned until the following business day.  We are closed weekends and major holidays. You have access to a nurse at all times for urgent questions. Please call the main number to the clinic [phone] and follow the prompts.  For any non-urgent questions, you may also contact your provider using MyChart. We now offer e-Visits for anyone 71 and older to request care online for non-urgent symptoms. For details visit [email or web address].   Also download the MyChart app! Go to the app store, search "MyChart", open the app, select [place], and log in with your MyChart username and password.

## 2023-03-16 NOTE — Progress Notes (Signed)
Patient tolerated injection with no complaints voiced.  Site clean and dry with no bruising or swelling noted at site.  See MAR for details.  Band aid applied.  Patient stable during and after injection.  Vss with discharge and left in satisfactory condition with no s/s of distress noted.

## 2023-03-21 ENCOUNTER — Telehealth (INDEPENDENT_AMBULATORY_CARE_PROVIDER_SITE_OTHER): Payer: Self-pay | Admitting: *Deleted

## 2023-03-21 NOTE — Telephone Encounter (Signed)
Pa submitted through cover my meds. Fax from [place]. Dicyclomine was approved through 02-16-23 - 03-20-24. Approval letter faxed to [facility].

## 2023-03-24 ENCOUNTER — Telehealth (INDEPENDENT_AMBULATORY_CARE_PROVIDER_SITE_OTHER): Payer: Self-pay

## 2023-03-24 NOTE — Telephone Encounter (Signed)
 Date: 03/21/2023 Isabeau Davidian [address] [place], [place] [address] Member Name: Wreatha Gambino Member ID Number: [id number] Thank you for trusting your Medicare prescription drug coverage to [employer] As our member, we want to help you get the most value from your prescription drug coverage and help you understand how your coverage works. As a member of [employer] we are pleased to inform you that, upon review of the information provided by you or your doctor, we have approved the requested coverage for the following prescription drug(s): DICYCLOMINE  HCL Capsule Type of coverage approved: Prior Authorization This approval authorizes your coverage from 02/16/2023 - 03/20/2024, unless we notify you otherwise, and as long as the following conditions apply: ? you remain enrolled in our Medicare Part D prescription drug plan, ? your physician or other prescriber continues to prescribe the medication for you, and ? the medication continues to be safe for treating your condition. Depending upon the strength and/or formulation of the drug prescribed by your physician, different quantity limits or safety edits may apply. Please consult your Medicare Part D plan's formulary for the specific quantity limit. If you have not already filled your prescription for this approved drug, you may do so at a participating network pharmacy. Thank you for allowing us  to serve you. This letter is informational only. No further action is required by you at this time. If you have questions or need help, please talk to your doctor or pharmacist, or contact Customer Care at [phone], 24 hours a day, 7 days a week. TTY users may call [phone]. Thank you, [employer]

## 2023-03-28 DIAGNOSIS — Z299 Encounter for prophylactic measures, unspecified: Secondary | ICD-10-CM | POA: Diagnosis not present

## 2023-03-28 DIAGNOSIS — I10 Essential (primary) hypertension: Secondary | ICD-10-CM | POA: Diagnosis not present

## 2023-03-28 DIAGNOSIS — B0239 Other herpes zoster eye disease: Secondary | ICD-10-CM | POA: Diagnosis not present

## 2023-03-30 ENCOUNTER — Inpatient Hospital Stay: Payer: Medicare Other

## 2023-04-13 ENCOUNTER — Inpatient Hospital Stay: Payer: Medicare Other | Attending: Hematology

## 2023-04-13 ENCOUNTER — Inpatient Hospital Stay: Payer: Medicare Other

## 2023-04-13 VITALS — BP 153/64 | HR 79 | Temp 97.6°F | Resp 18

## 2023-04-13 DIAGNOSIS — N1832 Chronic kidney disease, stage 3b: Secondary | ICD-10-CM | POA: Diagnosis not present

## 2023-04-13 DIAGNOSIS — D631 Anemia in chronic kidney disease: Secondary | ICD-10-CM | POA: Insufficient documentation

## 2023-04-13 DIAGNOSIS — D509 Iron deficiency anemia, unspecified: Secondary | ICD-10-CM

## 2023-04-13 DIAGNOSIS — D500 Iron deficiency anemia secondary to blood loss (chronic): Secondary | ICD-10-CM

## 2023-04-13 DIAGNOSIS — N1831 Chronic kidney disease, stage 3a: Secondary | ICD-10-CM

## 2023-04-13 DIAGNOSIS — D696 Thrombocytopenia, unspecified: Secondary | ICD-10-CM

## 2023-04-13 LAB — CBC
HCT: 31.6 % — ABNORMAL LOW (ref 36.0–46.0)
Hemoglobin: 9.8 g/dL — ABNORMAL LOW (ref 12.0–15.0)
MCH: 30.9 pg (ref 26.0–34.0)
MCHC: 31 g/dL (ref 30.0–36.0)
MCV: 99.7 fL (ref 80.0–100.0)
Platelets: 55 10*3/uL — ABNORMAL LOW (ref 150–400)
RBC: 3.17 MIL/uL — ABNORMAL LOW (ref 3.87–5.11)
RDW: 15.4 % (ref 11.5–15.5)
WBC: 3.1 10*3/uL — ABNORMAL LOW (ref 4.0–10.5)
nRBC: 0 % (ref 0.0–0.2)

## 2023-04-13 LAB — SAMPLE TO BLOOD BANK

## 2023-04-13 MED ORDER — EPOETIN ALFA-EPBX 10000 UNIT/ML IJ SOLN
10000.0000 [IU] | Freq: Once | INTRAMUSCULAR | Status: AC
  Administered 2023-04-13: 10000 [IU] via SUBCUTANEOUS
  Filled 2023-04-13: qty 1

## 2023-04-13 NOTE — Progress Notes (Signed)
 Patient's Hgb 9.8 and blood pressure stable. Patient tolerated Retacrit injection with no complaints voiced.  Site clean and dry with no bruising or swelling noted at site.  See MAR for details.  Band aid applied.  Patient stable during and after injection.  Vss with discharge and left in satisfactory condition with no s/s of distress noted. All follow ups as scheduled.   Jeanette Chang [employer]

## 2023-04-13 NOTE — Patient Instructions (Signed)

## 2023-04-25 DIAGNOSIS — B0233 Zoster keratitis: Secondary | ICD-10-CM | POA: Diagnosis not present

## 2023-04-27 ENCOUNTER — Inpatient Hospital Stay: Payer: Medicare Other

## 2023-04-27 ENCOUNTER — Inpatient Hospital Stay: Payer: Medicare Other | Attending: Hematology

## 2023-04-27 VITALS — BP 121/69 | HR 76 | Temp 97.6°F | Resp 18

## 2023-04-27 DIAGNOSIS — D500 Iron deficiency anemia secondary to blood loss (chronic): Secondary | ICD-10-CM

## 2023-04-27 DIAGNOSIS — N1832 Chronic kidney disease, stage 3b: Secondary | ICD-10-CM | POA: Diagnosis not present

## 2023-04-27 DIAGNOSIS — D696 Thrombocytopenia, unspecified: Secondary | ICD-10-CM

## 2023-04-27 DIAGNOSIS — N1831 Chronic kidney disease, stage 3a: Secondary | ICD-10-CM

## 2023-04-27 DIAGNOSIS — D509 Iron deficiency anemia, unspecified: Secondary | ICD-10-CM

## 2023-04-27 DIAGNOSIS — D631 Anemia in chronic kidney disease: Secondary | ICD-10-CM | POA: Insufficient documentation

## 2023-04-27 LAB — CBC WITH DIFFERENTIAL/PLATELET
Abs Immature Granulocytes: 0.01 10*3/uL (ref 0.00–0.07)
Basophils Absolute: 0 10*3/uL (ref 0.0–0.1)
Basophils Relative: 1 %
Eosinophils Absolute: 0.1 10*3/uL (ref 0.0–0.5)
Eosinophils Relative: 4 %
HCT: 30.8 % — ABNORMAL LOW (ref 36.0–46.0)
Hemoglobin: 9.7 g/dL — ABNORMAL LOW (ref 12.0–15.0)
Immature Granulocytes: 1 %
Lymphocytes Relative: 34 %
Lymphs Abs: 0.7 10*3/uL (ref 0.7–4.0)
MCH: 32 pg (ref 26.0–34.0)
MCHC: 31.5 g/dL (ref 30.0–36.0)
MCV: 101.7 fL — ABNORMAL HIGH (ref 80.0–100.0)
Monocytes Absolute: 0.2 10*3/uL (ref 0.1–1.0)
Monocytes Relative: 8 %
Neutro Abs: 1.1 10*3/uL — ABNORMAL LOW (ref 1.7–7.7)
Neutrophils Relative %: 52 %
Platelets: 49 10*3/uL — ABNORMAL LOW (ref 150–400)
RBC: 3.03 MIL/uL — ABNORMAL LOW (ref 3.87–5.11)
RDW: 16.1 % — ABNORMAL HIGH (ref 11.5–15.5)
Smear Review: DECREASED
WBC: 2.2 10*3/uL — ABNORMAL LOW (ref 4.0–10.5)
nRBC: 0 % (ref 0.0–0.2)

## 2023-04-27 LAB — COMPREHENSIVE METABOLIC PANEL
ALT: 27 U/L (ref 0–44)
AST: 35 U/L (ref 15–41)
Albumin: 3.6 g/dL (ref 3.5–5.0)
Alkaline Phosphatase: 83 U/L (ref 38–126)
Anion gap: 9 (ref 5–15)
BUN: 27 mg/dL — ABNORMAL HIGH (ref 8–23)
CO2: 25 mmol/L (ref 22–32)
Calcium: 9.5 mg/dL (ref 8.9–10.3)
Chloride: 108 mmol/L (ref 98–111)
Creatinine, Ser: 1 mg/dL (ref 0.44–1.00)
GFR, Estimated: 56 mL/min — ABNORMAL LOW (ref >60)
Glucose, Bld: 166 mg/dL — ABNORMAL HIGH (ref 70–99)
Potassium: 5 mmol/L (ref 3.5–5.1)
Sodium: 142 mmol/L (ref 135–145)
Total Bilirubin: 0.6 mg/dL (ref 0.0–1.2)
Total Protein: 7 g/dL (ref 6.5–8.1)

## 2023-04-27 LAB — IRON AND TIBC
Iron: 72 ug/dL (ref 28–170)
Saturation Ratios: 19 % (ref 10.4–31.8)
TIBC: 370 ug/dL (ref 250–450)
UIBC: 298 ug/dL

## 2023-04-27 LAB — FERRITIN: Ferritin: 48 ng/mL (ref 11–307)

## 2023-04-27 LAB — SAMPLE TO BLOOD BANK

## 2023-04-27 MED ORDER — EPOETIN ALFA-EPBX 10000 UNIT/ML IJ SOLN
10000.0000 [IU] | Freq: Once | INTRAMUSCULAR | Status: AC
  Administered 2023-04-27: 10000 [IU] via SUBCUTANEOUS
  Filled 2023-04-27: qty 1

## 2023-04-27 NOTE — Patient Instructions (Signed)
 [facility] [place] [facility]  Discharge Instructions: Thank you for choosing [facility] to provide your oncology and hematology care.  If you have a lab appointment with the Cancer Center - please note that after April 8th, 2024, all labs will be drawn in the cancer center.  You do not have to check in or register with the main entrance as you have in the past but will complete your check-in in the cancer center.  Wear comfortable clothing and clothing appropriate for easy access to any Portacath or PICC line.   We strive to give you quality time with your provider. You may need to reschedule your appointment if you arrive late (15 or more minutes).  Arriving late affects you and other patients whose appointments are after yours.  Also, if you miss three or more appointments without notifying the office, you may be dismissed from the clinic at the provider's discretion.      For prescription refill requests, have your pharmacy contact our office and allow 72 hours for refills to be completed.    Today you received the following chemotherapy and/or immunotherapy agents Retacrit      To help prevent nausea and vomiting after your treatment, we encourage you to take your nausea medication as directed.  BELOW ARE SYMPTOMS THAT SHOULD BE REPORTED IMMEDIATELY: *FEVER GREATER THAN 100.4 F (38 C) OR HIGHER *CHILLS OR SWEATING *NAUSEA AND VOMITING THAT IS NOT CONTROLLED WITH YOUR NAUSEA MEDICATION *UNUSUAL SHORTNESS OF BREATH *UNUSUAL BRUISING OR BLEEDING *URINARY PROBLEMS (pain or burning when urinating, or frequent urination) *BOWEL PROBLEMS (unusual diarrhea, constipation, pain near the anus) TENDERNESS IN MOUTH AND THROAT WITH OR WITHOUT PRESENCE OF ULCERS (sore throat, sores in mouth, or a toothache) UNUSUAL RASH, SWELLING OR PAIN  UNUSUAL VAGINAL DISCHARGE OR ITCHING   Items with * indicate a potential emergency and should be followed up  as soon as possible or go to the Emergency Department if any problems should occur.  Please show the CHEMOTHERAPY ALERT CARD or IMMUNOTHERAPY ALERT CARD at check-in to the Emergency Department and triage nurse.  Should you have questions after your visit or need to cancel or reschedule your appointment, please contact [facility]CER CTR [place] - A DEPT OF Eligha Bridegroom [facility] [phone]  and follow the prompts.  Office hours are 8:00 a.m. to 4:30 p.m. Monday - Friday. Please note that voicemails left after 4:00 p.m. may not be returned until the following business day.  We are closed weekends and major holidays. You have access to a nurse at all times for urgent questions. Please call the main number to the clinic [phone] and follow the prompts.  For any non-urgent questions, you may also contact your provider using MyChart. We now offer e-Visits for anyone 90 and older to request care online for non-urgent symptoms. For details visit [email or web address].   Also download the MyChart app! Go to the app store, search "MyChart", open the app, select [place], and log in with your MyChart username and password.

## 2023-04-27 NOTE — Progress Notes (Signed)
 Jeanette Chang presents today for Reticrit injection per the provider's orders.  Stable during administration without incident; injection site WNL; see MAR for injection details.  Patient tolerated procedure well and without incident.  No questions or complaints noted at this time.

## 2023-05-03 DIAGNOSIS — B0233 Zoster keratitis: Secondary | ICD-10-CM | POA: Diagnosis not present

## 2023-05-03 DIAGNOSIS — H5712 Ocular pain, left eye: Secondary | ICD-10-CM | POA: Diagnosis not present

## 2023-05-06 ENCOUNTER — Encounter (INDEPENDENT_AMBULATORY_CARE_PROVIDER_SITE_OTHER): Payer: Self-pay | Admitting: *Deleted

## 2023-05-09 DIAGNOSIS — E1169 Type 2 diabetes mellitus with other specified complication: Secondary | ICD-10-CM | POA: Diagnosis not present

## 2023-05-09 DIAGNOSIS — H6993 Unspecified Eustachian tube disorder, bilateral: Secondary | ICD-10-CM | POA: Diagnosis not present

## 2023-05-09 DIAGNOSIS — Z299 Encounter for prophylactic measures, unspecified: Secondary | ICD-10-CM | POA: Diagnosis not present

## 2023-05-09 DIAGNOSIS — I10 Essential (primary) hypertension: Secondary | ICD-10-CM | POA: Diagnosis not present

## 2023-05-10 NOTE — Progress Notes (Unsigned)
 [facility] [address], [place] [address]   CLINIC:  Medical Oncology/Hematology  PCP:  Kirstie Peri, MD [address] [place] [address] [phone]   REASON FOR VISIT:  Follow-up for pancytopenia  PRIOR THERAPY: Blood transfusions  CURRENT THERAPY: Intermittent IV iron + Retacrit  INTERVAL HISTORY:   Jeanette Chang 83 y.o. 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  She was last seen by Rojelio Brenner PA-C on 02/02/2023.  At today's visit, patient reports feeling fair, but reports some depression as she continues to grieve the loss of her husband and son.  She has not had any interval hospitalizations or major changes in health status since last visit.    She continues to tolerate her Retacrit injections reasonably well.  She denies any symptoms concerning for DVT/PE, and her blood pressure today is 181/76, but home SBP reportedly runs from 130-140.  She is compliant with home BP meds.  She reports decreased energy, recurrent ice pica, and increased restless legs/muscle cramps over the past month. She has some baseline shortness of breath on exertion, headaches, vertigo, and lightheadedness with standing. No recent chest pain or syncopal episodes.  She has not had any recent infections, B symptoms, or lymphadenopathy.  She had an episode of rectal bleeding from "busted hemorrhoid" earlier this week, as well as an episode of moderate epistaxis (lasting about 10 minutes).  No melena or hematemesis.  She continues to bruise easily in her extremities, as well as ongoing abdominal bruising at the locations of her insulin injections.   She has 50% energy and 100% appetite. She endorses that she is maintaining a stable weight.  ASSESSMENT & PLAN:  1.   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 - EGD (06/17/2020): Grade 2 esophageal varices, tumor at the pylorus,  biopsy benign.  Normal duodenum. - Colonoscopy (06/29/2019): Perianal skin tags found on perianal exam.  2 small polyps at the splenic flexure.  External hemorrhoids. - BMBX (09/09/2020): Normocellular marrow (20%) with TLH, including minimal dyserythropoiesis and 1% blasts by manual aspirate differential.  Mild dyserythropoiesis which does not fulfill criteria for MDS.  Karyotype 45, X, -X[4]/46, XX[16], loss of one X chromosome is rare finding in the bone marrow is of older woman and when present as a minor clone (less than 50% of 20 metaphases) is thought to be most likely aging effect. - Labs from December 2023 showed normal B12, folate, copper, SPEP.  CKD stage II/IIIa based on creatinine (CKD stage IIIb based on elevated Cystatin C 1.78) - History of PRBC transfusions x 2 in October 2023 - Most recent IV iron with Feraheme x 2 in November/December 2024  - Started on Retacrit 08/11/2022 - current dose 10,000 units every 2 weeks - Most recent labs (04/27/2023): Hgb 9.7/MCV 101.7. Ferritin 48, iron saturation 19% Creatinine 1.00/GFR 56 - Intermittent hematochezia thought to be from bleeding hemorrhoids.  No melena.  Occasional scant epistaxis. - PLAN: Recommend IV Feraheme x 2.   - Continue Retacrit 10,000 units every 2 weeks in the setting of anemia of CKD as well as dyserythropoiesis noted on BMBX from July 2022. - CBC/differential + BB sample every 2 weeks - Recheck CBC/D, CMP, and iron panel with RTC in 3 months - If any severe derangements beyond baseline cytopenias, would consider repeat bone marrow biopsy  2.  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  Normal duodenum. - Colonoscopy (06/29/2019): Perianal skin tags found on perianal exam.  2 small polyps at the splenic flexure.  External hemorrhoids. - BMBX (09/09/2020): Normocellular marrow (20%) with TLH, including  minimal dyserythropoiesis and 1% blasts by manual aspirate differential.  Mild dyserythropoiesis which does not fulfill criteria for MDS.  Karyotype 45, X, -X[4]/46, XX[16], loss of 1 X chromosome is rare finding in the bone marrow is of older woman and when present as a minor clone (less than 50% of 20 metaphases) is thought to be most likely aging effect. - CT [facility] (04/24/2021): Spleen enlarged measuring 14.1 cm with volume 632 mL.  Liver has nodular contour consistent with cirrhosis. - Labs from December 2023 showed normal B12 of, folate, copper, SPEP.  CKD stage IIIa/b. - No B symptoms or recent infections. She has easy bruising.  Reports epistaxis x1 this month - Most recent labs (04/27/2023): WBC 2.2/ANC 1.1, platelets 49 - PLAN: We will continue close surveillance.  Blood counts overall at baseline. - If any severe derangements beyond baseline cytopenias, would consider repeat bone marrow biopsy  3.  Easy bruising - Per patient report, PCP had concerns regarding her easy bruising - Additional labs were checked on 08/04/2022, which showed normal fibrinogen, normal APTT, and normal INR.  Von Willebrand panel was not consistent with diagnosis of von Willebrand's disease. 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  4.  Social/family history: - Other PMH includes NASH cirrhosis, diabetes, arthritis, carotid artery disease her daughter lives with her at home.  She is independent of ADLs and IADLs.  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  Quit smoking in November 1992. - Maternal grandmother had low platelets.  Sister had low platelet. - Mother had colon cancer.  Sister had stomach cancer.  Paternal uncle had lung cancer.   PLAN SUMMARY:  >> IV Feraheme x 2 >> CBC/BB sample + Retacrit every 2 weeks  >> Labs in 3 months = CBC/D, CMP, ferritin, iron/TIBC >> OFFICE visit in 3 months (2 weeks after labs)     REVIEW OF SYSTEMS:  Review of  Systems  Constitutional:  Positive for fatigue. Negative for appetite change, chills, diaphoresis, fever and unexpected weight change.  HENT:   Positive for nosebleeds. Negative for lump/mass.   Eyes:  Negative for eye problems.  Respiratory:  Positive for shortness of breath (with exertion). Negative for cough and hemoptysis.   Cardiovascular:  Positive for chest pain (at times, none today). Negative for leg swelling and palpitations.  Gastrointestinal:  Positive for diarrhea and nausea. Negative for abdominal pain, blood in stool, constipation and vomiting.  Genitourinary:  Negative for hematuria.   Musculoskeletal:  Positive for arthralgias.  Skin:  Negative for itching.  Neurological:  Positive for dizziness (vertigo), headaches and numbness. Negative for light-headedness.  Hematological:  Bruises/bleeds easily.  Psychiatric/Behavioral:  Positive for sleep disturbance. Negative for depression. The patient is not nervous/anxious.      PHYSICAL EXAM:  ECOG PERFORMANCE STATUS: 1 - Symptomatic but completely ambulatory  Vitals:   05/11/23 1417  BP: (!) 181/76  Pulse: 80  Resp: 18  Temp: 97.8 F (36.6 C)  SpO2: 100%    Filed Weights   05/11/23 1417  Weight: 143 lb (64.9 kg)    Physical Exam Constitutional:      Appearance: Normal appearance. She is normal weight.  Cardiovascular:  Heart sounds: Normal heart sounds.  Pulmonary:     Breath sounds: Normal breath sounds.  Skin:    Findings: Bruising present.  Neurological:     General: No focal deficit present.     Mental Status: Mental status is at baseline.  Psychiatric:        Behavior: Behavior normal. Behavior is cooperative.    PAST MEDICAL/SURGICAL HISTORY:  Past Medical History:  Diagnosis Date   Abnormal LFTs    Achilles bursitis or tendinitis    Acute maxillary sinusitis    Acute pharyngitis    Anemia, unspecified    Candidiasis of skin and nails    Dermatophytosis of foot    Dermatophytosis of the  body    Diverticulitis of colon (without mention of hemorrhage)(562.11)    Edema    Headache(784.0)    HOH (hard of hearing)    Malaise and fatigue    Occlusion and stenosis of carotid artery without mention of cerebral infarction    Osteoarthrosis, unspecified whether generalized or localized, unspecified site    Other dyspnea and respiratory abnormality    Other psoriasis    Other specified disorders of rotator cuff syndrome of shoulder and allied disorders    Pain in joint    Pure hypercholesterolemia    RUQ pain    Spasm of back muscles    Thrombocytopenia, unspecified (HCC)    Type II or unspecified type diabetes mellitus without mention of complication, not stated as uncontrolled    Unspecified essential hypertension    Unspecified sinusitis (chronic)    Urinary incontinence    Past Surgical History:  Procedure Laterality Date   APPENDECTOMY  1964   BIOPSY  03/02/2017   Procedure: BIOPSY;  Surgeon: Malissa Hippo, MD;  [facility];  Service: Endoscopy;;  antral   BIOPSY  06/17/2020   Procedure: BIOPSY;  Surgeon: Dolores Frame, MD;  [facility];  Service: Gastroenterology;;   CAROTID ENDARTERECTOMY  2005   Left CEA   CAROTID ENDARTERECTOMY  2009   Right CEA   CATARACT EXTRACTION, BILATERAL Bilateral    CHOLECYSTECTOMY  1982   Gall Bladder   COLONOSCOPY  09   COLONOSCOPY N/A 06/27/2019   Procedure: COLONOSCOPY;  Surgeon: Malissa Hippo, MD;  [facility];  Service: Endoscopy;  Laterality: N/A;   ESOPHAGOGASTRODUODENOSCOPY N/A 03/02/2017   Procedure: ESOPHAGOGASTRODUODENOSCOPY (EGD);  Surgeon: Malissa Hippo, MD;  [facility];  Service: Endoscopy;  Laterality: N/A;  12:55   ESOPHAGOGASTRODUODENOSCOPY N/A 06/27/2019   Procedure: ESOPHAGOGASTRODUODENOSCOPY (EGD);  Surgeon: Malissa Hippo, MD;  [facility];  Service: Endoscopy;  Laterality: N/A;  730   ESOPHAGOGASTRODUODENOSCOPY (EGD) WITH PROPOFOL N/A  06/17/2020   Procedure: ESOPHAGOGASTRODUODENOSCOPY (EGD) WITH PROPOFOL;  Surgeon: Dolores Frame, MD;  [facility];  Service: Gastroenterology;  Laterality: N/A;  AM   GIVENS CAPSULE STUDY N/A 10/24/2019   Procedure: GIVENS CAPSULE STUDY;  Surgeon: Malissa Hippo, MD;  [facility];  Service: Endoscopy;  Laterality: N/A;  730   KNEE ARTHROSCOPY WITH MEDIAL MENISECTOMY Left 07/10/2019   Procedure: KNEE ARTHROSCOPY WITH MEDIAL MENISCECTOMY;  Surgeon: Vickki Hearing, MD;  [facility];  Service: Orthopedics;  Laterality: Left;   KNEE ARTHROSCOPY WITH MEDIAL MENISECTOMY Left 03/14/2020   Procedure: KNEE ARTHROSCOPY WITH MEDIAL MENISCECTOMY;  Surgeon: Vickki Hearing, MD;  [facility];  Service: Orthopedics;  Laterality: Left;   NOSE SURGERY  1978  PARATHYROIDECTOMY  1992   POLYPECTOMY  03/02/2017   Procedure: POLYPECTOMY;  Surgeon: Malissa Hippo, MD;  [facility];  Service: Endoscopy;;  gastric    POLYPECTOMY  06/27/2019   Procedure: POLYPECTOMY;  Surgeon: Malissa Hippo, MD;  [facility];  Service: Endoscopy;;  colon   Power port  03/06/2009   Right upper chest    TONSILLECTOMY     TUMOR EXCISION Right August 24, 2013   [facility] Dr. Haynes Hoehn, ENT   VAGINAL HYSTERECTOMY  [phone]    SOCIAL HISTORY:  Social History   Socioeconomic History   Marital status: Widowed    Spouse name: Not on file   Number of children: Not on file   Years of education: Not on file   Highest education level: Not on file  Occupational History   Not on file  Tobacco Use   Smoking status: Former    Current packs/day: 0.00    Average packs/day: 1 pack/day for 42.0 years (42.0 ttl pk-yrs)    Types: Cigarettes    Start date: 03/12/1950    Quit date: 03/12/1992    Years since quitting: 31.1    Passive exposure: Past   Smokeless tobacco: Former    Quit date: 12/16/1992  Vaping Use   Vaping status: Never Used   Substance and Sexual Activity   Alcohol use: Yes    Alcohol/week: 1.0 standard drink of alcohol    Types: 1 Glasses of wine per week    Comment: very seldom   Drug use: No   Sexual activity: Not Currently  Other Topics Concern   Not on file  Social History Narrative   Not on file   Social Drivers of Health   Financial Resource Strain: Low Risk  (04/07/2021)   Received from [facility], [facility]   Overall Financial Resource Strain (CARDIA)    Difficulty of Paying Living Expenses: Not hard at all  Food Insecurity: Low Risk  (01/25/2023)   Received from [facility]   Hunger Vital Sign    Worried About Running Out of Food in the Last Year: Never true    Ran Out of Food in the Last Year: Never true  Transportation Needs: No Transportation Needs (01/25/2023)   Received from [facility]    In the past 12 months, has lack of reliable transportation kept you from medical appointments, meetings, work or from getting things needed for daily living? : No  Physical Activity: Inactive (04/07/2021)   Received from [facility], [facility]   Exercise Vital Sign    Days of Exercise per Week: 0 days    Minutes of Exercise per Session: 0 min  Stress: No Stress Concern Present (04/07/2021)   Received from [facility], [facility] of Occupational Health - Occupational Stress Questionnaire    Feeling of Stress : Not at all  Social Connections: Moderately Isolated (04/07/2021)   Received from [facility], [facility]   Social Connection and Isolation Panel [NHANES]    Frequency of Communication with Friends and Family: More than three times a week    Frequency of Social Gatherings with Friends and Family: Twice a week    Attends Religious Services: Never    Database administrator or Organizations: No    Attends Banker Meetings: Never    Marital Status: Married  Catering manager Violence: Not At  Risk  (04/07/2021)   Received from [facility], [facility]   Humiliation, Afraid, Rape, and Kick questionnaire    Fear of Current or Ex-Partner: No    Emotionally Abused: No    Physically Abused: No    Sexually Abused: No    FAMILY HISTORY:  Family History  Problem Relation Age of Onset   Heart attack Mother    Cancer Mother    Heart attack Father    Cancer Sister     CURRENT MEDICATIONS:  Outpatient Encounter Medications as of 05/11/2023  Medication Sig Note   carvedilol (COREG) 3.125 MG tablet Take 3.125 mg by mouth 2 (two) times daily.    fluticasone (FLONASE) 50 MCG/ACT nasal spray Place into both nostrils.    acetaminophen (TYLENOL) 500 MG tablet Take 1 tablet (500 mg total) by mouth every 6 (six) hours as needed for moderate pain or headache.    B-D UF III MINI PEN NEEDLES 31G X 5 MM MISC SMARTSIG:1 Pen Needle SUB-Q Daily    Biotin 10 MG CAPS Take by mouth daily at 6 (six) AM.    dicyclomine (BENTYL) 10 MG capsule TAKE 1 CAPSULE (10 MG TOTAL) BY MOUTH 3 (THREE) TIMES DAILY BEFORE MEALS.    furosemide (LASIX) 20 MG tablet Take 20 mg by mouth as needed.    gabapentin (NEURONTIN) 300 MG capsule Take 300 mg by mouth at bedtime.    losartan (COZAAR) 25 MG tablet Take 25 mg by mouth daily.    meclizine (ANTIVERT) 25 MG tablet Take 25 mg by mouth 3 (three) times daily as needed.    metFORMIN (GLUCOPHAGE) 500 MG tablet Take 500 mg by mouth 2 (two) times daily with a meal.    Multiple Vitamins-Minerals (EYE VITAMINS PO) Take by mouth. Red-2 for eyes    ONETOUCH ULTRA test strip 1 (ONE) STRIP DAILY,DM2,E11.65    OVER THE COUNTER MEDICATION Iron as needed. Not daily    OVER THE COUNTER MEDICATION Compounded Hemorrhoid cream [facility]) Apply rectally up to four times per day. 04/26/2022: prn   potassium chloride SA (KLOR-CON M) 20 MEQ tablet Take 10 mEq by mouth daily.    TRESIBA FLEXTOUCH 100 UNIT/ML FlexTouch Pen SMARTSIG:10 Unit(s) SUB-Q Daily    [DISCONTINUED] aspirin  EC 81 MG tablet Take 81 mg by mouth every other day. AT NIGHT.    [DISCONTINUED] carvedilol (COREG) 6.25 MG tablet Take 1 tablet (6.25 mg total) by mouth 2 (two) times daily with a meal. (Patient taking differently: Take 3.25 mg by mouth 2 (two) times daily with a meal.)    [DISCONTINUED] cholecalciferol (VITAMIN D) 25 MCG (1000 UNIT) tablet Take 1,000 Units by mouth daily.    [DISCONTINUED] HYDROcodone-acetaminophen (NORCO/VICODIN) 5-325 MG tablet Take 1 tablet by mouth daily as needed.    [DISCONTINUED] levofloxacin (LEVAQUIN) 500 MG tablet Take 500 mg by mouth daily.    [DISCONTINUED] lisinopril (ZESTRIL) 40 MG tablet Take 20 mg by mouth daily.    [DISCONTINUED] traMADol (ULTRAM) 50 MG tablet Take 50 mg by mouth at bedtime.    Facility-Administered Encounter Medications as of 05/11/2023  Medication   sodium chloride irrigation 0.9 %    ALLERGIES:  Allergies  Allergen Reactions   Penicillins Other (See Comments), Rash, Swelling and Anaphylaxis    Has patient had a PCN reaction causing immediate rash, facial/tongue/throat swelling, SOB or lightheadedness with hypotension: No  Has patient had a PCN reaction causing severe rash involving mucus membranes or skin necrosis: No  Has patient  had a PCN reaction that required hospitalization: Yes  Has patient had a PCN reaction occurring within the last 10 years: No  If all of the above answers are "NO", then may proceed with Cephalosporin use.  Other Reaction(s): Other (See Comments)  Has patient had a PCN reaction causing immediate rash, facial/tongue/throat swelling, SOB or lightheadedness with hypotension: No Has patient had a PCN reaction causing severe rash involving mucus membranes or skin necrosis: No Has patient had a PCN reaction that required hospitalization: Yes Has patient had a PCN reaction occurring within the last 10 years: No If all of the above answers are "NO", then may proceed with Cephalosporin use.   Codeine Nausea And  Vomiting and Rash   [place] Purpureus Went Yeast Other (See Comments)    Headaches, myalgias  Other Reaction(s): Other (See Comments)  Headaches, myalgias Headaches, myalgias Headaches, myalgias    Headaches, myalgias   Niacin And Related Other (See Comments)    Headaches, myalgias   Red Yeast Rice Other (See Comments)    Headaches, myalgias   Actos [Pioglitazone Hydrochloride] Other (See Comments)    Severe headache   Amaryl Nausea Only and Other (See Comments)    Severe headache (patient is tolerating 2 mg dosing)   Iron Diarrhea   Sanctura [Trospium Chloride] Nausea Only and Other (See Comments)    Severe headache   Statins Other (See Comments)    Severe headache,  Hips and leg weakness that cause patient not to be able to function as per patient.   Toviaz [Fesoterodine Fumarate] Other (See Comments)    Cramps, severe headache   Norco [Hydrocodone-Acetaminophen] Nausea And Vomiting and Anxiety   Ultram [Tramadol Hcl] Nausea And Vomiting    LABORATORY DATA:  I have reviewed the labs as listed.  CBC    Component Value Date/Time   WBC 1.9 (L) 05/11/2023 1305   RBC 2.87 (L) 05/11/2023 1305   HGB 9.1 (L) 05/11/2023 1305   HCT 29.5 (L) 05/11/2023 1305   PLT 44 (L) 05/11/2023 1305   MCV 102.8 (H) 05/11/2023 1305   MCH 31.7 05/11/2023 1305   MCHC 30.8 05/11/2023 1305   RDW 15.9 (H) 05/11/2023 1305   LYMPHSABS 0.7 04/27/2023 1305   MONOABS 0.2 04/27/2023 1305   EOSABS 0.1 04/27/2023 1305   BASOSABS 0.0 04/27/2023 1305      Latest Ref Rng & Units 04/27/2023    1:05 PM 02/02/2023   12:51 PM 11/03/2022    1:34 PM  CMP  Glucose 70 - 99 mg/dL 102  725  366   BUN 8 - 23 mg/dL 27  27  43   Creatinine 0.44 - 1.00 mg/dL 4.40  3.47  4.25   Sodium 135 - 145 mmol/L 142  137  141   Potassium 3.5 - 5.1 mmol/L 5.0  4.3  4.4   Chloride 98 - 111 mmol/L 108  105  106   CO2 22 - 32 mmol/L 25  25  24    Calcium 8.9 - 10.3 mg/dL 9.5  9.6  8.9   Total Protein 6.5 - 8.1 g/dL 7.0  6.8   6.8   Total Bilirubin 0.0 - 1.2 mg/dL 0.6  0.6  0.7   Alkaline Phos 38 - 126 U/L 83  116  97   AST 15 - 41 U/L 35  45  31   ALT 0 - 44 U/L 27  41  27     DIAGNOSTIC IMAGING:  I have independently reviewed the relevant  imaging and discussed with the patient.   WRAP UP:  All questions were answered. The patient knows to call the clinic with any problems, questions or concerns.  Medical decision making: Moderate  Time spent on visit: I spent 20 minutes counseling the patient face to face. The total time spent in the appointment was 30 minutes and more than 50% was on counseling.  Carnella Guadalajara, PA-C  05/11/23 3:19 PM

## 2023-05-11 ENCOUNTER — Inpatient Hospital Stay (HOSPITAL_BASED_OUTPATIENT_CLINIC_OR_DEPARTMENT_OTHER): Payer: Medicare Other | Admitting: Physician Assistant

## 2023-05-11 ENCOUNTER — Inpatient Hospital Stay: Payer: Medicare Other

## 2023-05-11 DIAGNOSIS — N1831 Chronic kidney disease, stage 3a: Secondary | ICD-10-CM

## 2023-05-11 DIAGNOSIS — D696 Thrombocytopenia, unspecified: Secondary | ICD-10-CM

## 2023-05-11 DIAGNOSIS — D631 Anemia in chronic kidney disease: Secondary | ICD-10-CM

## 2023-05-11 DIAGNOSIS — N1832 Chronic kidney disease, stage 3b: Secondary | ICD-10-CM

## 2023-05-11 DIAGNOSIS — D500 Iron deficiency anemia secondary to blood loss (chronic): Secondary | ICD-10-CM

## 2023-05-11 LAB — SAMPLE TO BLOOD BANK

## 2023-05-11 LAB — CBC
HCT: 29.5 % — ABNORMAL LOW (ref 36.0–46.0)
Hemoglobin: 9.1 g/dL — ABNORMAL LOW (ref 12.0–15.0)
MCH: 31.7 pg (ref 26.0–34.0)
MCHC: 30.8 g/dL (ref 30.0–36.0)
MCV: 102.8 fL — ABNORMAL HIGH (ref 80.0–100.0)
Platelets: 44 10*3/uL — ABNORMAL LOW (ref 150–400)
RBC: 2.87 MIL/uL — ABNORMAL LOW (ref 3.87–5.11)
RDW: 15.9 % — ABNORMAL HIGH (ref 11.5–15.5)
WBC: 1.9 10*3/uL — ABNORMAL LOW (ref 4.0–10.5)
nRBC: 0 % (ref 0.0–0.2)

## 2023-05-11 NOTE — Patient Instructions (Addendum)
 [facility] at [facility] **VISIT SUMMARY & IMPORTANT INSTRUCTIONS **   You were seen today by Rojelio Brenner PA-C for your follow-up visit.    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 We will schedule you for IV iron x 2. Continue RETACRIT (epoetin alfa injection) every 2 weeks to help your body make more blood cells. We will continue to check your blood counts every 2 weeks on the same day that you receive the Retacrit injection.  LOW PLATELETS & WHITE BLOOD CELLS Your low platelets and low white blood cells remain low, but are overall stable. 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 The other labs that we checked did not show any other reason for you to have any severe bruising.  Your bruising is most likely related to your low platelets.  FOLLOW-UP APPOINTMENT: Office visit in 3 months  ** Thank you for trusting me with your healthcare!  I strive to provide all of my patients with quality care at each visit.  If you receive a survey for this visit, I would be so grateful to you for taking the time to provide feedback.  Thank you in advance!  ~ Makynli Stills                   Dr. Doreatha Massed   &   Rojelio Brenner, PA-C   - - - - - - - - - - - - - - - - - -    Thank you for choosing [facility] at [facility] to provide your oncology and hematology care.  To afford each patient quality time with our provider, please arrive at least 15 minutes before your scheduled appointment time.   If you have a lab appointment with the Cancer Center please come in thru the Main Entrance and check in at the main information desk.  You need to re-schedule your appointment should you arrive 10 or more minutes late.  We strive to give you quality time with our  providers, and arriving late affects you and other patients whose appointments are after yours.  Also, if you no show three or more times for appointments you may be dismissed from the clinic at the providers discretion.     Again, thank you for choosing [facility].  Our hope is that these requests will decrease the amount of time that you wait before being seen by our physicians.       _____________________________________________________________  Should you have questions after your visit to [facility], please contact our office at [phone] and follow the prompts.  Our office hours are 8:00 a.m. and 4:30 p.m. Monday - Friday.  Please note that voicemails left after 4:00 p.m. may not be returned until the following business day.  We are closed weekends and major holidays.  You do have access to a nurse 24-7, just call the main number to the clinic [phone] and do not press any options, hold on the line and a nurse will answer the phone.    For prescription refill requests, have your pharmacy contact our office and allow 72 hours.

## 2023-05-11 NOTE — Progress Notes (Signed)
 Manual BP 180/64. PA made aware. Per PA to hold Retacrit shot today and have patient reschedule later this week. Pt and patient's family made aware and agrees to plan. Pt stable at discharge with no signs of distress.   Jeanette Chang [employer]

## 2023-05-12 ENCOUNTER — Inpatient Hospital Stay

## 2023-05-12 ENCOUNTER — Ambulatory Visit (INDEPENDENT_AMBULATORY_CARE_PROVIDER_SITE_OTHER): Payer: Medicare Other | Admitting: Gastroenterology

## 2023-05-12 ENCOUNTER — Encounter (INDEPENDENT_AMBULATORY_CARE_PROVIDER_SITE_OTHER): Payer: Self-pay | Admitting: Gastroenterology

## 2023-05-12 VITALS — BP 164/61 | HR 72 | Temp 97.7°F | Resp 18

## 2023-05-12 VITALS — BP 156/71 | HR 65 | Temp 97.5°F | Ht 63.0 in | Wt 143.6 lb

## 2023-05-12 DIAGNOSIS — D631 Anemia in chronic kidney disease: Secondary | ICD-10-CM | POA: Diagnosis not present

## 2023-05-12 DIAGNOSIS — Z1159 Encounter for screening for other viral diseases: Secondary | ICD-10-CM | POA: Diagnosis not present

## 2023-05-12 DIAGNOSIS — D61818 Other pancytopenia: Secondary | ICD-10-CM | POA: Diagnosis not present

## 2023-05-12 DIAGNOSIS — K317 Polyp of stomach and duodenum: Secondary | ICD-10-CM

## 2023-05-12 DIAGNOSIS — K582 Mixed irritable bowel syndrome: Secondary | ICD-10-CM

## 2023-05-12 DIAGNOSIS — I8510 Secondary esophageal varices without bleeding: Secondary | ICD-10-CM

## 2023-05-12 DIAGNOSIS — K580 Irritable bowel syndrome with diarrhea: Secondary | ICD-10-CM | POA: Diagnosis not present

## 2023-05-12 DIAGNOSIS — K643 Fourth degree hemorrhoids: Secondary | ICD-10-CM

## 2023-05-12 DIAGNOSIS — K7460 Unspecified cirrhosis of liver: Secondary | ICD-10-CM

## 2023-05-12 DIAGNOSIS — D509 Iron deficiency anemia, unspecified: Secondary | ICD-10-CM

## 2023-05-12 DIAGNOSIS — K7581 Nonalcoholic steatohepatitis (NASH): Secondary | ICD-10-CM | POA: Diagnosis not present

## 2023-05-12 DIAGNOSIS — N1832 Chronic kidney disease, stage 3b: Secondary | ICD-10-CM | POA: Diagnosis not present

## 2023-05-12 MED ORDER — EPOETIN ALFA-EPBX 10000 UNIT/ML IJ SOLN
10000.0000 [IU] | Freq: Once | INTRAMUSCULAR | Status: AC
  Administered 2023-05-12: 10000 [IU] via SUBCUTANEOUS
  Filled 2023-05-12: qty 1

## 2023-05-12 NOTE — Patient Instructions (Signed)
-   Continue Imodium as needed for diarrhea -Continue with dicyclomine every 12 hours as needed for abdominal pain - Continue carvedilol 3.125 mg BID - Check CBC, MELD labs and AFP, hepatitis A/B antibodies -Schedule liver US - Reduce salt intake to <2 g per day - Can take Tylenol max of 2 g per day (650 mg q8h) for pain - Avoid NSAIDs for pain - Avoid eating raw oysters/shellfish - Protein shake (Ensure or Boost) every night before going to sleep - Follow up with hematology for pancytopenia/iron deficiency anemia

## 2023-05-12 NOTE — Patient Instructions (Signed)
 [facility] [place] [facility]  Discharge Instructions: Thank you for choosing [facility] to provide your oncology and hematology care.  If you have a lab appointment with the Cancer Center - please note that after April 8th, 2024, all labs will be drawn in the cancer center.  You do not have to check in or register with the main entrance as you have in the past but will complete your check-in in the cancer center.  Wear comfortable clothing and clothing appropriate for easy access to any Portacath or PICC line.   We strive to give you quality time with your provider. You may need to reschedule your appointment if you arrive late (15 or more minutes).  Arriving late affects you and other patients whose appointments are after yours.  Also, if you miss three or more appointments without notifying the office, you may be dismissed from the clinic at the provider's discretion.      For prescription refill requests, have your pharmacy contact our office and allow 72 hours for refills to be completed.    Today you received the following chemotherapy and/or immunotherapy agents Retacrit      To help prevent nausea and vomiting after your treatment, we encourage you to take your nausea medication as directed.  BELOW ARE SYMPTOMS THAT SHOULD BE REPORTED IMMEDIATELY: *FEVER GREATER THAN 100.4 F (38 C) OR HIGHER *CHILLS OR SWEATING *NAUSEA AND VOMITING THAT IS NOT CONTROLLED WITH YOUR NAUSEA MEDICATION *UNUSUAL SHORTNESS OF BREATH *UNUSUAL BRUISING OR BLEEDING *URINARY PROBLEMS (pain or burning when urinating, or frequent urination) *BOWEL PROBLEMS (unusual diarrhea, constipation, pain near the anus) TENDERNESS IN MOUTH AND THROAT WITH OR WITHOUT PRESENCE OF ULCERS (sore throat, sores in mouth, or a toothache) UNUSUAL RASH, SWELLING OR PAIN  UNUSUAL VAGINAL DISCHARGE OR ITCHING   Items with * indicate a potential emergency and should be followed up  as soon as possible or go to the Emergency Department if any problems should occur.  Please show the CHEMOTHERAPY ALERT CARD or IMMUNOTHERAPY ALERT CARD at check-in to the Emergency Department and triage nurse.  Should you have questions after your visit or need to cancel or reschedule your appointment, please contact [facility]CER CTR [place] - A DEPT OF Eligha Bridegroom [facility] [phone]  and follow the prompts.  Office hours are 8:00 a.m. to 4:30 p.m. Monday - Friday. Please note that voicemails left after 4:00 p.m. may not be returned until the following business day.  We are closed weekends and major holidays. You have access to a nurse at all times for urgent questions. Please call the main number to the clinic [phone] and follow the prompts.  For any non-urgent questions, you may also contact your provider using MyChart. We now offer e-Visits for anyone 90 and older to request care online for non-urgent symptoms. For details visit [email or web address].   Also download the MyChart app! Go to the app store, search "MyChart", open the app, select [place], and log in with your MyChart username and password.

## 2023-05-12 NOTE — Progress Notes (Signed)
Jeanette Chang presents today for Retacrit injection per the provider's orders.  Stable during administration without incident; injection site WNL; see MAR for injection details.  Patient tolerated procedure well and without incident.  No questions or complaints noted at this time.

## 2023-05-12 NOTE — Progress Notes (Unsigned)
 [name], M.D. Gastroenterology & Hepatology [facility] [address] [place], [place] [address]  Primary Care Physician: Kirstie Peri, MD [address] [place] [address]  I will communicate my assessment and recommendations to the referring MD via EMR.  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   The patient was last seen on 11/01/2022. At that time, the patient was continued on Imodium as needed for diarrhea.  Coreg was increased to 6.25 mg twice daily.  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  Depending on the type of the food she eats, she may have up to 2 loose BMs.  Sometimes has a small amount of bleeding when she defecates, which stops with the [facility] hemorrhoidal cream. Only one time she had significant bleeding that led to filling the commode with blood, but stopped with the cream.  She had facial shingles and had to follow with the ophthalmologist as it involved her eye. Received treatment for it.  May have some episodes of abdominal pain still, which improve when she takes Bentyl. She is taking Bentyl almost every 12 hours consistently. She sits down after taking the medication to avoid side effects.  The patient denies having any nausea, vomiting, fever, chills, hematochezia, melena, hematemesis, abdominal distention, jaundice, pruritus or weight loss.  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  Was recommended to continue IV iron infusions for now. Last CBC and iron stores ***  Cirrhosis related questions: Hematemesis/coffee ground emesis: No Abdominal pain: yes, as above Abdominal  distention/worsening ascites: No Fever/chills: No Episodes of confusion/disorientation: No Taking diuretics?: Yes, Lasix 20 mg as needed.  History of variceal bleeding: No Prior history of banding?: No, but had grade 2 esophageal varices on treatment with beta-blockers in the past Prior episodes of SBP: No Last time liver imaging was performed: 11/12/22 [place] - no masses Last AFP: 11/03/2022  - <1.8 MELD 3.0 score: 11/03/2022  - 12 Vaccination status Hep A/B: does not think she received it  Last EGD:06/17/2020 Grade II varices were found in the lower third of the esophagus. An area of irregularity was found in the posterior rim of the pyloric channed. This was visualized with the aid of a transparent cap. I pulled the area with a cold forceps and was able to see a medium-sized - 1 cm, frond-like/villous, non-circumferential mass with no bleeding and no stigmata of recent bleeding was found at the pylorus. It had some adenomatous features - unclear if coming from pylorus or from duodenal bulb. One biopsy was taken with a cold forceps for histology, no more biopsies were performed as patient presented significant bleeding that stopped on its own. The examined duodenum and ampulla were normal.   Path: Granulation tissue neg for malignancy or HP.   Last Colonoscopy:06/2019 - two small cecal and SF polyps, one cecal 6 mm polyp. All polyps were TA. Hemorrhoids  Past Medical History: Past Medical History:  Diagnosis Date   Abnormal LFTs    Achilles bursitis or tendinitis    Acute maxillary sinusitis    Acute pharyngitis    Anemia, unspecified    Candidiasis of skin and nails    Dermatophytosis of foot    Dermatophytosis of  the body    Diverticulitis of colon (without mention of hemorrhage)(562.11)    Edema    Headache(784.0)    HOH (hard of hearing)    Malaise and fatigue    Occlusion and stenosis of carotid artery without mention of cerebral infarction    Osteoarthrosis, unspecified whether  generalized or localized, unspecified site    Other dyspnea and respiratory abnormality    Other psoriasis    Other specified disorders of rotator cuff syndrome of shoulder and allied disorders    Pain in joint    Pure hypercholesterolemia    RUQ pain    Spasm of back muscles    Thrombocytopenia, unspecified (HCC)    Type II or unspecified type diabetes mellitus without mention of complication, not stated as uncontrolled    Unspecified essential hypertension    Unspecified sinusitis (chronic)    Urinary incontinence     Past Surgical History: Past Surgical History:  Procedure Laterality Date   APPENDECTOMY  1964   BIOPSY  03/02/2017   Procedure: BIOPSY;  Surgeon: Malissa Hippo, MD;  [facility];  Service: Endoscopy;;  antral   BIOPSY  06/17/2020   Procedure: BIOPSY;  Surgeon: Dolores Frame, MD;  [facility];  Service: Gastroenterology;;   CAROTID ENDARTERECTOMY  2005   Left CEA   CAROTID ENDARTERECTOMY  2009   Right CEA   CATARACT EXTRACTION, BILATERAL Bilateral    CHOLECYSTECTOMY  1982   Gall Bladder   COLONOSCOPY  09   COLONOSCOPY N/A 06/27/2019   Procedure: COLONOSCOPY;  Surgeon: Malissa Hippo, MD;  [facility];  Service: Endoscopy;  Laterality: N/A;   ESOPHAGOGASTRODUODENOSCOPY N/A 03/02/2017   Procedure: ESOPHAGOGASTRODUODENOSCOPY (EGD);  Surgeon: Malissa Hippo, MD;  [facility];  Service: Endoscopy;  Laterality: N/A;  12:55   ESOPHAGOGASTRODUODENOSCOPY N/A 06/27/2019   Procedure: ESOPHAGOGASTRODUODENOSCOPY (EGD);  Surgeon: Malissa Hippo, MD;  [facility];  Service: Endoscopy;  Laterality: N/A;  730   ESOPHAGOGASTRODUODENOSCOPY (EGD) WITH PROPOFOL N/A 06/17/2020   Procedure: ESOPHAGOGASTRODUODENOSCOPY (EGD) WITH PROPOFOL;  Surgeon: Dolores Frame, MD;  [facility];  Service: Gastroenterology;  Laterality: N/A;  AM   GIVENS CAPSULE STUDY N/A 10/24/2019   Procedure: GIVENS  CAPSULE STUDY;  Surgeon: Malissa Hippo, MD;  [facility];  Service: Endoscopy;  Laterality: N/A;  730   KNEE ARTHROSCOPY WITH MEDIAL MENISECTOMY Left 07/10/2019   Procedure: KNEE ARTHROSCOPY WITH MEDIAL MENISCECTOMY;  Surgeon: Vickki Hearing, MD;  Location: [facility];  Service: Orthopedics;  Laterality: Left;   KNEE ARTHROSCOPY WITH MEDIAL MENISECTOMY Left 03/14/2020   Procedure: KNEE ARTHROSCOPY WITH MEDIAL MENISCECTOMY;  Surgeon: Vickki Hearing, MD;  Location: [facility];  Service: Orthopedics;  Laterality: Left;   NOSE SURGERY  1978   PARATHYROIDECTOMY  1992   POLYPECTOMY  03/02/2017   Procedure: POLYPECTOMY;  Surgeon: Malissa Hippo, MD;  [facility];  Service: Endoscopy;;  gastric    POLYPECTOMY  06/27/2019   Procedure: POLYPECTOMY;  Surgeon: Malissa Hippo, MD;  [facility];  Service: Endoscopy;;  colon   Power port  03/06/2009   Right upper chest    TONSILLECTOMY     TUMOR EXCISION Right August 24, 2013   [facility] Dr. Haynes Hoehn, ENT   VAGINAL HYSTERECTOMY  1972    Family History: Family History  Problem Relation Age of Onset   Heart attack Mother  Cancer Mother    Heart attack Father    Cancer Sister     Social History: Social History   Tobacco Use  Smoking Status Former   Current packs/day: 0.00   Average packs/day: 1 pack/day for 42.0 years (42.0 ttl pk-yrs)   Types: Cigarettes   Start date: 03/12/1950   Quit date: 03/12/1992   Years since quitting: 31.1   Passive exposure: Past  Smokeless Tobacco Former   Quit date: 12/16/1992   Social History   Substance and Sexual Activity  Alcohol Use Yes   Alcohol/week: 1.0 standard drink of alcohol   Types: 1 Glasses of wine per week   Comment: very seldom   Social History   Substance and Sexual Activity  Drug Use No    Allergies: Allergies  Allergen Reactions   Penicillins Other (See Comments), Rash, Swelling and Anaphylaxis    Has patient had  a PCN reaction causing immediate rash, facial/tongue/throat swelling, SOB or lightheadedness with hypotension: No  Has patient had a PCN reaction causing severe rash involving mucus membranes or skin necrosis: No  Has patient had a PCN reaction that required hospitalization: Yes  Has patient had a PCN reaction occurring within the last 10 years: No  If all of the above answers are "NO", then may proceed with Cephalosporin use.  Other Reaction(s): Other (See Comments)  Has patient had a PCN reaction causing immediate rash, facial/tongue/throat swelling, SOB or lightheadedness with hypotension: No Has patient had a PCN reaction causing severe rash involving mucus membranes or skin necrosis: No Has patient had a PCN reaction that required hospitalization: Yes Has patient had a PCN reaction occurring within the last 10 years: No If all of the above answers are "NO", then may proceed with Cephalosporin use.   Codeine Nausea And Vomiting and Rash   [place] Purpureus Went Yeast Other (See Comments)    Headaches, myalgias  Other Reaction(s): Other (See Comments)  Headaches, myalgias Headaches, myalgias Headaches, myalgias    Headaches, myalgias   Niacin And Related Other (See Comments)    Headaches, myalgias   Red Yeast Rice Other (See Comments)    Headaches, myalgias   Actos [Pioglitazone Hydrochloride] Other (See Comments)    Severe headache   Amaryl Nausea Only and Other (See Comments)    Severe headache (patient is tolerating 2 mg dosing)   Iron Diarrhea   Sanctura [Trospium Chloride] Nausea Only and Other (See Comments)    Severe headache   Statins Other (See Comments)    Severe headache,  Hips and leg weakness that cause patient not to be able to function as per patient.   Toviaz [Fesoterodine Fumarate] Other (See Comments)    Cramps, severe headache   Norco [Hydrocodone-Acetaminophen] Nausea And Vomiting and Anxiety   Ultram [Tramadol Hcl] Nausea And Vomiting     Medications: Current Outpatient Medications  Medication Sig Dispense Refill   acetaminophen (TYLENOL) 500 MG tablet Take 1 tablet (500 mg total) by mouth every 6 (six) hours as needed for moderate pain or headache. 30 tablet 0   B-D UF III MINI PEN NEEDLES 31G X 5 MM MISC SMARTSIG:1 Pen Needle SUB-Q Daily     Biotin 10 MG CAPS Take by mouth daily at 6 (six) AM.     carvedilol (COREG) 3.125 MG tablet Take 3.125 mg by mouth 2 (two) times daily.     dicyclomine (BENTYL) 10 MG capsule TAKE 1 CAPSULE (10 MG TOTAL) BY MOUTH 3 (THREE) TIMES DAILY BEFORE MEALS. 270 capsule  0   fluticasone (FLONASE) 50 MCG/ACT nasal spray Place into both nostrils.     furosemide (LASIX) 20 MG tablet Take 20 mg by mouth as needed.     gabapentin (NEURONTIN) 300 MG capsule Take 300 mg by mouth at bedtime.     losartan (COZAAR) 25 MG tablet Take 25 mg by mouth daily.     meclizine (ANTIVERT) 25 MG tablet Take 25 mg by mouth 3 (three) times daily as needed.     metFORMIN (GLUCOPHAGE) 500 MG tablet Take 500 mg by mouth 2 (two) times daily with a meal.     Multiple Vitamins-Minerals (EYE VITAMINS PO) Take by mouth. Red-2 for eyes     ONETOUCH ULTRA test strip 1 (ONE) STRIP DAILY,DM2,E11.65     OVER THE COUNTER MEDICATION Compounded Hemorrhoid cream [facility]) Apply rectally up to four times per day.     potassium chloride SA (KLOR-CON M) 20 MEQ tablet Take 10 mEq by mouth daily.     PREDNISONE PO Take by mouth. Taking for fluid in ears.     TRESIBA FLEXTOUCH 100 UNIT/ML FlexTouch Pen 18 units daily     No current facility-administered medications for this visit.   Facility-Administered Medications Ordered in Other Visits  Medication Dose Route Frequency Provider Last Rate Last Admin   sodium chloride irrigation 0.9 %    PRN Vickki Hearing, MD   1,000 mL at 03/14/20 0945    Review of Systems: GENERAL: negative for malaise, night sweats HEENT: No changes in hearing or vision, no nose bleeds or  other nasal problems. NECK: Negative for lumps, goiter, pain and significant neck swelling RESPIRATORY: Negative for cough, wheezing CARDIOVASCULAR: Negative for chest pain, leg swelling, palpitations, orthopnea GI: SEE HPI MUSCULOSKELETAL: Negative for joint pain or swelling, back pain, and muscle pain. SKIN: Negative for lesions, rash PSYCH: Negative for sleep disturbance, mood disorder and recent psychosocial stressors. HEMATOLOGY Negative for prolonged bleeding, bruising easily, and swollen nodes. ENDOCRINE: Negative for cold or heat intolerance, polyuria, polydipsia and goiter. NEURO: negative for tremor, gait imbalance, syncope and seizures. The remainder of the review of systems is noncontributory.   Physical Exam: BP (!) 156/71   Pulse 65   Temp (!) 97.5 F (36.4 C)   Ht 5\' 3"  (1.6 m)   Wt 143 lb 9.6 oz (65.1 kg)   BMI 25.44 kg/m  GENERAL: The patient is AO x3, in no acute distress. HEENT: Head is normocephalic and atraumatic. EOMI are intact. Mouth is well hydrated and without lesions. NECK: Supple. No masses LUNGS: Clear to auscultation. No presence of rhonchi/wheezing/rales. Adequate chest expansion HEART: RRR, normal s1 and s2. ABDOMEN: Soft, nontender, no guarding, no peritoneal signs, and nondistended. BS +. No masses. EXTREMITIES: Without any cyanosis, clubbing, rash, lesions or edema. NEUROLOGIC: AOx3, no focal motor deficit. SKIN: no jaundice, no rashes  Imaging/Labs: as above  I personally reviewed and interpreted the available labs, imaging and endoscopic files.  Impression and Plan: Takeshia Wenk is a 83 y.o. female coming for follow up of ***   All questions were answered.      [name], MD Gastroenterology and Hepatology [facility]

## 2023-05-16 DIAGNOSIS — B0233 Zoster keratitis: Secondary | ICD-10-CM | POA: Diagnosis not present

## 2023-05-16 LAB — COMPREHENSIVE METABOLIC PANEL WITH GFR
AG Ratio: 1.3 (calc) (ref 1.0–2.5)
ALT: 21 U/L (ref 6–29)
AST: 22 U/L (ref 10–35)
Albumin: 3.9 g/dL (ref 3.6–5.1)
Alkaline phosphatase (APISO): 88 U/L (ref 37–153)
BUN/Creatinine Ratio: 33 (calc) — ABNORMAL HIGH (ref 6–22)
BUN: 29 mg/dL — ABNORMAL HIGH (ref 7–25)
CO2: 21 mmol/L (ref 20–32)
Calcium: 9.9 mg/dL (ref 8.6–10.4)
Chloride: 106 mmol/L (ref 98–110)
Creat: 0.87 mg/dL (ref 0.60–0.95)
Globulin: 3 g/dL (ref 1.9–3.7)
Glucose, Bld: 262 mg/dL — ABNORMAL HIGH (ref 65–99)
Potassium: 4.3 mmol/L (ref 3.5–5.3)
Sodium: 141 mmol/L (ref 135–146)
Total Bilirubin: 0.5 mg/dL (ref 0.2–1.2)
Total Protein: 6.9 g/dL (ref 6.1–8.1)
eGFR: 66 mL/min/{1.73_m2} (ref >=60)

## 2023-05-16 LAB — PROTIME-INR
INR: 1.1
Prothrombin Time: 11.9 s — ABNORMAL HIGH (ref 9.0–11.5)

## 2023-05-16 LAB — HEPATITIS A ANTIBODY, TOTAL: Hepatitis A AB,Total: REACTIVE — AB

## 2023-05-16 LAB — AFP TUMOR MARKER: AFP-Tumor Marker: 2.4 ng/mL

## 2023-05-16 LAB — HEPATITIS B SURFACE ANTIGEN: Hepatitis B Surface Ag: NONREACTIVE

## 2023-05-18 ENCOUNTER — Inpatient Hospital Stay: Attending: Hematology

## 2023-05-18 VITALS — BP 170/77 | HR 62 | Temp 96.8°F | Resp 18

## 2023-05-18 DIAGNOSIS — N1832 Chronic kidney disease, stage 3b: Secondary | ICD-10-CM | POA: Insufficient documentation

## 2023-05-18 DIAGNOSIS — D631 Anemia in chronic kidney disease: Secondary | ICD-10-CM | POA: Diagnosis not present

## 2023-05-18 DIAGNOSIS — N1831 Chronic kidney disease, stage 3a: Secondary | ICD-10-CM

## 2023-05-18 DIAGNOSIS — D509 Iron deficiency anemia, unspecified: Secondary | ICD-10-CM | POA: Diagnosis not present

## 2023-05-18 MED ORDER — CETIRIZINE HCL 10 MG/ML IV SOLN
5.0000 mg | Freq: Once | INTRAVENOUS | Status: AC
  Administered 2023-05-18: 5 mg via INTRAVENOUS
  Filled 2023-05-18: qty 1

## 2023-05-18 MED ORDER — SODIUM CHLORIDE 0.9 % IV SOLN
510.0000 mg | Freq: Once | INTRAVENOUS | Status: AC
  Administered 2023-05-18: 510 mg via INTRAVENOUS
  Filled 2023-05-18: qty 510

## 2023-05-18 MED ORDER — ACETAMINOPHEN 325 MG PO TABS
650.0000 mg | ORAL_TABLET | Freq: Once | ORAL | Status: AC
  Administered 2023-05-18: 650 mg via ORAL
  Filled 2023-05-18: qty 2

## 2023-05-18 MED ORDER — SODIUM CHLORIDE 0.9 % IV SOLN: INTRAVENOUS | Status: DC

## 2023-05-18 MED ORDER — CETIRIZINE HCL 10 MG PO TABS
5.0000 mg | ORAL_TABLET | Freq: Once | ORAL | Status: DC
Start: 2023-05-18 — End: 2023-05-18

## 2023-05-18 NOTE — Progress Notes (Signed)
 Patient tolerated iron infusion with no complaints voiced.  Peripheral IV site clean and dry with good blood return noted before and after infusion.  Band aid applied.  VSS with discharge and left in satisfactory condition with no s/s of distress noted.

## 2023-05-18 NOTE — Patient Instructions (Signed)

## 2023-05-25 ENCOUNTER — Inpatient Hospital Stay

## 2023-05-25 VITALS — BP 152/58 | HR 67 | Temp 97.5°F | Resp 18

## 2023-05-25 DIAGNOSIS — D509 Iron deficiency anemia, unspecified: Secondary | ICD-10-CM

## 2023-05-25 DIAGNOSIS — D500 Iron deficiency anemia secondary to blood loss (chronic): Secondary | ICD-10-CM

## 2023-05-25 DIAGNOSIS — N1832 Chronic kidney disease, stage 3b: Secondary | ICD-10-CM | POA: Diagnosis not present

## 2023-05-25 DIAGNOSIS — D631 Anemia in chronic kidney disease: Secondary | ICD-10-CM

## 2023-05-25 DIAGNOSIS — D696 Thrombocytopenia, unspecified: Secondary | ICD-10-CM

## 2023-05-25 LAB — CBC
HCT: 34 % — ABNORMAL LOW (ref 36.0–46.0)
Hemoglobin: 10.2 g/dL — ABNORMAL LOW (ref 12.0–15.0)
MCH: 31.6 pg (ref 26.0–34.0)
MCHC: 30 g/dL (ref 30.0–36.0)
MCV: 105.3 fL — ABNORMAL HIGH (ref 80.0–100.0)
Platelets: 50 10*3/uL — ABNORMAL LOW (ref 150–400)
RBC: 3.23 MIL/uL — ABNORMAL LOW (ref 3.87–5.11)
RDW: 15.8 % — ABNORMAL HIGH (ref 11.5–15.5)
WBC: 2.4 10*3/uL — ABNORMAL LOW (ref 4.0–10.5)
nRBC: 0 % (ref 0.0–0.2)

## 2023-05-25 LAB — SAMPLE TO BLOOD BANK

## 2023-05-25 MED ORDER — EPOETIN ALFA-EPBX 10000 UNIT/ML IJ SOLN
10000.0000 [IU] | Freq: Once | INTRAMUSCULAR | Status: AC
  Administered 2023-05-25: 10000 [IU] via SUBCUTANEOUS
  Filled 2023-05-25: qty 1

## 2023-05-25 NOTE — Patient Instructions (Signed)
 [facility] [place] - A DEPT OF [facility]  Discharge Instructions: Thank you for choosing [facility] to provide your oncology and hematology care.  If you have a lab appointment with the Cancer Center - please note that after April 8th, 2024, all labs will be drawn in the cancer center.  You do not have to check in or register with the main entrance as you have in the past but will complete your check-in in the cancer center.  Wear comfortable clothing and clothing appropriate for easy access to any Portacath or PICC line.   We strive to give you quality time with your provider. You may need to reschedule your appointment if you arrive late (15 or more minutes).  Arriving late affects you and other patients whose appointments are after yours.  Also, if you miss three or more appointments without notifying the office, you may be dismissed from the clinic at the provider's discretion.      For prescription refill requests, have your pharmacy contact our office and allow 72 hours for refills to be completed.    Today you received Retacrit 10,000U injection.     BELOW ARE SYMPTOMS THAT SHOULD BE REPORTED IMMEDIATELY: *FEVER GREATER THAN 100.4 F (38 C) OR HIGHER *CHILLS OR SWEATING *NAUSEA AND VOMITING THAT IS NOT CONTROLLED WITH YOUR NAUSEA MEDICATION *UNUSUAL SHORTNESS OF BREATH *UNUSUAL BRUISING OR BLEEDING *URINARY PROBLEMS (pain or burning when urinating, or frequent urination) *BOWEL PROBLEMS (unusual diarrhea, constipation, pain near the anus) TENDERNESS IN MOUTH AND THROAT WITH OR WITHOUT PRESENCE OF ULCERS (sore throat, sores in mouth, or a toothache) UNUSUAL RASH, SWELLING OR PAIN  UNUSUAL VAGINAL DISCHARGE OR ITCHING   Items with * indicate a potential emergency and should be followed up as soon as possible or go to the Emergency Department if any problems should occur.  Please show the CHEMOTHERAPY ALERT CARD or IMMUNOTHERAPY ALERT CARD at  check-in to the Emergency Department and triage nurse.  Should you have questions after your visit or need to cancel or reschedule your appointment, please contact [facility] [place] - A DEPT OF [facility] [phone]  and follow the prompts.  Office hours are 8:00 a.m. to 4:30 p.m. Monday - Friday. Please note that voicemails left after 4:00 p.m. may not be returned until the following business day.  We are closed weekends and major holidays. You have access to a nurse at all times for urgent questions. Please call the main number to the clinic [phone] and follow the prompts.  For any non-urgent questions, you may also contact your provider using MyChart. We now offer e-Visits for anyone 75 and older to request care online for non-urgent symptoms. For details visit [email or web address].   Also download the MyChart app! Go to the app store, search "MyChart", open the app, select [facility], and log in with your MyChart username and password.

## 2023-05-25 NOTE — Progress Notes (Signed)
 Kayleanna Lorman presents today for injection per the provider's orders.  Retacrit 10,000 units administration without incident; injection site WNL; see MAR for injection details.  Patient tolerated procedure well and without incident.  No questions or complaints noted at this time. Patient's hemoglobin noted to be 10.2 today.  Discharged from clinic ambulatory in stable condition. Alert and oriented x 3. F/U with [facility] as scheduled.

## 2023-05-26 ENCOUNTER — Inpatient Hospital Stay

## 2023-05-26 ENCOUNTER — Ambulatory Visit (HOSPITAL_COMMUNITY)
Admission: RE | Admit: 2023-05-26 | Discharge: 2023-05-26 | Disposition: A | Discharge status: Home / Self Care | Source: Ambulatory Visit | Attending: Gastroenterology | Admitting: Gastroenterology

## 2023-05-26 VITALS — BP 170/58 | HR 70 | Temp 96.4°F | Resp 18

## 2023-05-26 DIAGNOSIS — D509 Iron deficiency anemia, unspecified: Secondary | ICD-10-CM

## 2023-05-26 DIAGNOSIS — K7581 Nonalcoholic steatohepatitis (NASH): Secondary | ICD-10-CM | POA: Diagnosis not present

## 2023-05-26 DIAGNOSIS — N1832 Chronic kidney disease, stage 3b: Secondary | ICD-10-CM | POA: Diagnosis not present

## 2023-05-26 DIAGNOSIS — D631 Anemia in chronic kidney disease: Secondary | ICD-10-CM

## 2023-05-26 DIAGNOSIS — K7460 Unspecified cirrhosis of liver: Secondary | ICD-10-CM | POA: Insufficient documentation

## 2023-05-26 MED ORDER — ACETAMINOPHEN 325 MG PO TABS
650.0000 mg | ORAL_TABLET | Freq: Once | ORAL | Status: AC
  Administered 2023-05-26: 650 mg via ORAL
  Filled 2023-05-26: qty 2

## 2023-05-26 MED ORDER — CETIRIZINE HCL 10 MG PO TABS
5.0000 mg | ORAL_TABLET | Freq: Once | ORAL | Status: DC
Start: 2023-05-26 — End: 2023-05-26

## 2023-05-26 MED ORDER — SODIUM CHLORIDE 0.9 % IV SOLN
510.0000 mg | Freq: Once | INTRAVENOUS | Status: AC
  Administered 2023-05-26: 510 mg via INTRAVENOUS
  Filled 2023-05-26: qty 510

## 2023-05-26 MED ORDER — CETIRIZINE HCL 10 MG/ML IV SOLN
5.0000 mg | Freq: Once | INTRAVENOUS | Status: AC
  Administered 2023-05-26: 5 mg via INTRAVENOUS
  Filled 2023-05-26: qty 1

## 2023-05-26 MED ORDER — SODIUM CHLORIDE 0.9 % IV SOLN: INTRAVENOUS | Status: DC

## 2023-05-26 NOTE — Progress Notes (Signed)
 Patient tolerated iron infusion with no complaints voiced.  Peripheral IV site clean and dry with good blood return noted before and after infusion.  Band aid applied.  VSS with discharge and left in satisfactory condition with no s/s of distress noted.

## 2023-05-26 NOTE — Patient Instructions (Signed)

## 2023-06-02 ENCOUNTER — Other Ambulatory Visit: Payer: Self-pay | Admitting: Physician Assistant

## 2023-06-02 ENCOUNTER — Ambulatory Visit (INDEPENDENT_AMBULATORY_CARE_PROVIDER_SITE_OTHER): Admitting: Physician Assistant

## 2023-06-02 ENCOUNTER — Encounter: Payer: Self-pay | Admitting: Physician Assistant

## 2023-06-02 VITALS — BP 135/71 | Ht 63.0 in | Wt 141.8 lb

## 2023-06-02 DIAGNOSIS — E1165 Type 2 diabetes mellitus with hyperglycemia: Secondary | ICD-10-CM

## 2023-06-02 DIAGNOSIS — Z7689 Persons encountering health services in other specified circumstances: Secondary | ICD-10-CM

## 2023-06-02 DIAGNOSIS — Z794 Long term (current) use of insulin: Secondary | ICD-10-CM | POA: Diagnosis not present

## 2023-06-02 MED ORDER — INSULIN ASPART 100 UNIT/ML IJ SOLN: 5.0000 [IU] | Freq: Three times a day (TID) | INTRAMUSCULAR | 99 refills | Status: DC

## 2023-06-02 NOTE — Progress Notes (Signed)
 New Patient Office Visit  Subjective    Patient ID: Jeanette Chang, female    DOB: [birth year]  Age: 83 y.o. MRN: [id number]  CC:  Chief Complaint  Patient presents with   Establish Care    HPI Deauna Yaw presents to establish care  Patient presents today with past medical history significant for type 2 diabetes, hyperlipidemia, liver cirrhosis secondary to NASH, IBS, CKD, pancytopenia, iron deficiency anemia, and arterial vascular disease. She reports no concerns with current medications. She follows closely with hematology/oncology for management of iron deficiency anemia and pancytopenia with iron infusions every 2 weeks. She follows with GI every 6 months for her cirrhosis and IBS. Patient wishing to establish with PCP for management of diabetes and other acute needs. Patient lives with her daughter for care assistance. They reports no major concerns today.   Outpatient Encounter Medications as of 06/02/2023  Medication Sig   insulin aspart (NOVOLOG) 100 UNIT/ML injection Inject 5 Units into the skin with breakfast, with lunch, and with evening meal. Sliding scale- 150-200= 3 units, 201-250= 5 units, 251-300= 8 units   acetaminophen (TYLENOL) 500 MG tablet Take 1 tablet (500 mg total) by mouth every 6 (six) hours as needed for moderate pain or headache.   B-D UF III MINI PEN NEEDLES 31G X 5 MM MISC SMARTSIG:1 Pen Needle SUB-Q Daily   Biotin 10 MG CAPS Take by mouth daily at 6 (six) AM.   carvedilol (COREG) 3.125 MG tablet Take 3.125 mg by mouth 2 (two) times daily.   dicyclomine (BENTYL) 10 MG capsule TAKE 1 CAPSULE (10 MG TOTAL) BY MOUTH 3 (THREE) TIMES DAILY BEFORE MEALS.   fluticasone (FLONASE) 50 MCG/ACT nasal spray Place into both nostrils.   furosemide (LASIX) 20 MG tablet Take 20 mg by mouth as needed.   gabapentin (NEURONTIN) 300 MG capsule Take 300 mg by mouth at bedtime.   losartan (COZAAR) 25 MG tablet Take 25 mg by mouth daily.   metFORMIN (GLUCOPHAGE) 500 MG tablet  Take 500 mg by mouth 2 (two) times daily with a meal.   Multiple Vitamins-Minerals (EYE VITAMINS PO) Take by mouth. Red-2 for eyes   ONETOUCH ULTRA test strip 1 (ONE) STRIP DAILY,DM2,E11.65   OVER THE COUNTER MEDICATION Compounded Hemorrhoid cream [facility]) Apply rectally up to four times per day.   TRESIBA FLEXTOUCH 100 UNIT/ML FlexTouch Pen 18 units daily   [DISCONTINUED] meclizine (ANTIVERT) 25 MG tablet Take 25 mg by mouth 3 (three) times daily as needed.   [DISCONTINUED] potassium chloride SA (KLOR-CON M) 20 MEQ tablet Take 10 mEq by mouth daily.   [DISCONTINUED] PREDNISONE PO Take by mouth. Taking for fluid in ears.   Facility-Administered Encounter Medications as of 06/02/2023  Medication   sodium chloride irrigation 0.9 %    Past Medical History:  Diagnosis Date   Abnormal LFTs    Achilles bursitis or tendinitis    Acute maxillary sinusitis    Acute pharyngitis    Anemia, unspecified    Candidiasis of skin and nails    Dermatophytosis of foot    Dermatophytosis of the body    Diverticulitis of colon (without mention of hemorrhage)(562.11)    Edema    Headache(784.0)    HOH (hard of hearing)    Malaise and fatigue    Occlusion and stenosis of carotid artery without mention of cerebral infarction    Osteoarthrosis, unspecified whether generalized or localized, unspecified site    Other dyspnea and respiratory abnormality    Other  psoriasis    Other specified disorders of rotator cuff syndrome of shoulder and allied disorders    Pain in joint    Pure hypercholesterolemia    RUQ pain    Spasm of back muscles    Thrombocytopenia, unspecified (HCC)    Type II or unspecified type diabetes mellitus without mention of complication, not stated as uncontrolled    Unspecified essential hypertension    Unspecified sinusitis (chronic)    Urinary incontinence     Past Surgical History:  Procedure Laterality Date   APPENDECTOMY  1964   BIOPSY  03/02/2017    Procedure: BIOPSY;  Surgeon: Ruby Corporal, MD;  [facility];  Service: Endoscopy;;  antral   BIOPSY  06/17/2020   Procedure: BIOPSY;  Surgeon: Urban Garden, MD;  [facility];  Service: Gastroenterology;;   CAROTID ENDARTERECTOMY  2005   Left CEA   CAROTID ENDARTERECTOMY  2009   Right CEA   CATARACT EXTRACTION, BILATERAL Bilateral    CHOLECYSTECTOMY  1982   Gall Bladder   COLONOSCOPY  09   COLONOSCOPY N/A 06/27/2019   Procedure: COLONOSCOPY;  Surgeon: Ruby Corporal, MD;  [facility];  Service: Endoscopy;  Laterality: N/A;   ESOPHAGOGASTRODUODENOSCOPY N/A 03/02/2017   Procedure: ESOPHAGOGASTRODUODENOSCOPY (EGD);  Surgeon: Ruby Corporal, MD;  [facility];  Service: Endoscopy;  Laterality: N/A;  12:55   ESOPHAGOGASTRODUODENOSCOPY N/A 06/27/2019   Procedure: ESOPHAGOGASTRODUODENOSCOPY (EGD);  Surgeon: Ruby Corporal, MD;  [facility];  Service: Endoscopy;  Laterality: N/A;  730   ESOPHAGOGASTRODUODENOSCOPY (EGD) WITH PROPOFOL N/A 06/17/2020   Procedure: ESOPHAGOGASTRODUODENOSCOPY (EGD) WITH PROPOFOL;  Surgeon: Urban Garden, MD;  [facility];  Service: Gastroenterology;  Laterality: N/A;  AM   GIVENS CAPSULE STUDY N/A 10/24/2019   Procedure: GIVENS CAPSULE STUDY;  Surgeon: Ruby Corporal, MD;  [facility];  Service: Endoscopy;  Laterality: N/A;  730   KNEE ARTHROSCOPY WITH MEDIAL MENISECTOMY Left 07/10/2019   Procedure: KNEE ARTHROSCOPY WITH MEDIAL MENISCECTOMY;  Surgeon: Darrin Emerald, MD;  Location: [facility];  Service: Orthopedics;  Laterality: Left;   KNEE ARTHROSCOPY WITH MEDIAL MENISECTOMY Left 03/14/2020   Procedure: KNEE ARTHROSCOPY WITH MEDIAL MENISCECTOMY;  Surgeon: Darrin Emerald, MD;  Location[facility];  Service: Orthopedics;  Laterality: Left;   NOSE SURGERY  1978   PARATHYROIDECTOMY  1992   POLYPECTOMY  03/02/2017   Procedure: POLYPECTOMY;  Surgeon: Ruby Corporal, MD;  [facility];  Service: Endoscopy;;  gastric    POLYPECTOMY  06/27/2019   Procedure: POLYPECTOMY;  Surgeon: Ruby Corporal, MD;  [facility];  Service: Endoscopy;;  colon   Power port  03/06/2009   Right upper chest    TONSILLECTOMY     TUMOR EXCISION Right August 24, 2013   [facility] Dr. Billie Budge, ENT   VAGINAL HYSTERECTOMY  1972    Family History  Problem Relation Age of Onset   Heart attack Mother    Cancer Mother    Heart attack Father    Cancer Sister     Social History   Socioeconomic History   Marital status: Widowed    Spouse name: Not on file   Number of children: Not on file   Years of education: Not on file   Highest education level: Not on file  Occupational History   Not on file  Tobacco Use   Smoking status:  Former    Current packs/day: 0.00    Average packs/day: 1 pack/day for 42.0 years (42.0 ttl pk-yrs)    Types: Cigarettes    Start date: 03/12/1950    Quit date: 03/12/1992    Years since quitting: 31.2    Passive exposure: Past   Smokeless tobacco: Former    Quit date: 12/16/1992  Vaping Use   Vaping status: Never Used  Substance and Sexual Activity   Alcohol use: Yes    Alcohol/week: 1.0 standard drink of alcohol    Types: 1 Glasses of wine per week    Comment: very seldom   Drug use: No   Sexual activity: Not Currently  Other Topics Concern   Not on file  Social History Narrative   Not on file   Social Drivers of Health   Financial Resource Strain: Low Risk  (04/07/2021)   Received from [facility], [facility]   Overall Financial Resource Strain (CARDIA)    Difficulty of Paying Living Expenses: Not hard at all  Food Insecurity: Low Risk  (01/25/2023)   Received from [facility]   Hunger Vital Sign    Worried About Running Out of Food in the Last Year: Never true    Ran Out of Food in the Last Year: Never true  Transportation Needs: No Transportation Needs (01/25/2023)    Received from [facility]    In the past 12 months, has lack of reliable transportation kept you from medical appointments, meetings, work or from getting things needed for daily living? : No  Physical Activity: Inactive (04/07/2021)   Received from [facility], [facility]   Exercise Vital Sign    Days of Exercise per Week: 0 days    Minutes of Exercise per Session: 0 min  Stress: No Stress Concern Present (04/07/2021)   Received from [facility], [facility] of Occupational Health - Occupational Stress Questionnaire    Feeling of Stress : Not at all  Social Connections: Moderately Isolated (04/07/2021)   Received from [facility], [facility]   Social Connection and Isolation Panel [NHANES]    Frequency of Communication with Friends and Family: More than three times a week    Frequency of Social Gatherings with Friends and Family: Twice a week    Attends Religious Services: Never    Database administrator or Organizations: No    Attends Banker Meetings: Never    Marital Status: Married  Catering manager Violence: Not At Risk (04/07/2021)   Received from [facility], [facility]   Humiliation, Afraid, Rape, and Kick questionnaire    Fear of Current or Ex-Partner: No    Emotionally Abused: No    Physically Abused: No    Sexually Abused: No    Review of Systems  Constitutional:  Negative for fever, malaise/fatigue and weight loss.  HENT:  Positive for nosebleeds.   Respiratory:  Negative for shortness of breath.   Cardiovascular:  Negative for chest pain and palpitations.  Gastrointestinal:  Positive for constipation and diarrhea. Negative for nausea and vomiting.  Musculoskeletal:  Positive for joint pain. Negative for myalgias.  Endo/Heme/Allergies:  Bruises/bleeds easily.  Psychiatric/Behavioral:  Negative for depression. The patient is not nervous/anxious.         Objective    BP 135/71    Ht 5\' 3"  (1.6 m)   Wt 141 lb 12.8 oz (64.3 kg)  BMI 25.12 kg/m   Physical Exam Constitutional:      Appearance: Normal appearance.  HENT:     Head: Normocephalic.     Mouth/Throat:     Mouth: Mucous membranes are moist.     Pharynx: Oropharynx is clear.  Eyes:     Extraocular Movements: Extraocular movements intact.     Conjunctiva/sclera: Conjunctivae normal.  Cardiovascular:     Rate and Rhythm: Normal rate and regular rhythm.     Heart sounds: Normal heart sounds. No murmur heard. Pulmonary:     Effort: Pulmonary effort is normal.     Breath sounds: Normal breath sounds. No wheezing, rhonchi or rales.  Musculoskeletal:     Right lower leg: No edema.     Left lower leg: No edema.  Skin:    General: Skin is warm and dry.  Neurological:     General: No focal deficit present.     Mental Status: She is alert and oriented to person, place, and time.  Psychiatric:        Mood and Affect: Mood normal.        Behavior: Behavior normal.       Assessment & Plan:  Encounter to establish care  Type 2 diabetes mellitus with hyperglycemia, with long-term current use of insulin (HCC) Assessment & Plan: Not Controlled. Hyperglycemia. Continue current management with the addition of short acting insulin at meals with with sliding scale.  Patient is on an ARB, does not tolerate statins. Lipid panel checked today. Continue dietary efforts and physical activity. Routine diabetic retinopathy screening: up-to-date. Follow up in 3 months.    Orders: -     Hemoglobin A1c -     Lipid panel -     Insulin Aspart; Inject 5 Units into the skin with breakfast, with lunch, and with evening meal. Sliding scale- 150-200= 3 units, 201-250= 5 units, 251-300= 8 units  Dispense: 10 mL; Refill: PRN    Return in about 3 months (around 09/01/2023).   Jearlean Mince Maizee Reinhold, PA-C

## 2023-06-02 NOTE — Assessment & Plan Note (Signed)
 Not Controlled. Hyperglycemia. Continue current management with the addition of short acting insulin at meals with with sliding scale.  Patient is on an ARB, does not tolerate statins. Lipid panel checked today. Continue dietary efforts and physical activity. Routine diabetic retinopathy screening: up-to-date. Follow up in 3 months.

## 2023-06-03 LAB — LIPID PANEL
Chol/HDL Ratio: 4.1 ratio (ref 0.0–4.4)
Cholesterol, Total: 200 mg/dL — ABNORMAL HIGH (ref 100–199)
HDL: 49 mg/dL (ref >39)
LDL Chol Calc (NIH): 125 mg/dL — ABNORMAL HIGH (ref 0–99)
Triglycerides: 146 mg/dL (ref 0–149)
VLDL Cholesterol Cal: 26 mg/dL (ref 5–40)

## 2023-06-03 LAB — HEMOGLOBIN A1C
Est. average glucose Bld gHb Est-mCnc: 148 mg/dL
Hgb A1c MFr Bld: 6.8 % — ABNORMAL HIGH (ref 4.8–5.6)

## 2023-06-08 ENCOUNTER — Inpatient Hospital Stay

## 2023-06-08 ENCOUNTER — Other Ambulatory Visit: Payer: Self-pay | Admitting: Physician Assistant

## 2023-06-08 VITALS — BP 128/65 | HR 71 | Temp 97.7°F | Resp 19

## 2023-06-08 DIAGNOSIS — M62838 Other muscle spasm: Secondary | ICD-10-CM

## 2023-06-08 DIAGNOSIS — D631 Anemia in chronic kidney disease: Secondary | ICD-10-CM

## 2023-06-08 DIAGNOSIS — D500 Iron deficiency anemia secondary to blood loss (chronic): Secondary | ICD-10-CM

## 2023-06-08 DIAGNOSIS — D509 Iron deficiency anemia, unspecified: Secondary | ICD-10-CM

## 2023-06-08 DIAGNOSIS — N1832 Chronic kidney disease, stage 3b: Secondary | ICD-10-CM | POA: Diagnosis not present

## 2023-06-08 DIAGNOSIS — D696 Thrombocytopenia, unspecified: Secondary | ICD-10-CM

## 2023-06-08 LAB — CBC
HCT: 33.6 % — ABNORMAL LOW (ref 36.0–46.0)
Hemoglobin: 10.6 g/dL — ABNORMAL LOW (ref 12.0–15.0)
MCH: 32.8 pg (ref 26.0–34.0)
MCHC: 31.5 g/dL (ref 30.0–36.0)
MCV: 104 fL — ABNORMAL HIGH (ref 80.0–100.0)
Platelets: 37 10*3/uL — ABNORMAL LOW (ref 150–400)
RBC: 3.23 MIL/uL — ABNORMAL LOW (ref 3.87–5.11)
RDW: 16.2 % — ABNORMAL HIGH (ref 11.5–15.5)
WBC: 1.8 10*3/uL — ABNORMAL LOW (ref 4.0–10.5)
nRBC: 0 % (ref 0.0–0.2)

## 2023-06-08 LAB — SAMPLE TO BLOOD BANK

## 2023-06-08 MED ORDER — EPOETIN ALFA-EPBX 10000 UNIT/ML IJ SOLN
10000.0000 [IU] | Freq: Once | INTRAMUSCULAR | Status: AC
  Administered 2023-06-08: 10000 [IU] via SUBCUTANEOUS
  Filled 2023-06-08: qty 1

## 2023-06-08 NOTE — Progress Notes (Signed)
 Hemoglobin today is 10.6.  We will proceed with Retacrit  per provider orders.   Patient is c/o bilateral lower leg spasms.  Spasms are interrupting sleep.  Spasms worsened after the 2nd iron infusion.  Sheril Dines, PA-C made aware. She felt that symptoms should improve if related to iron since no further irons were scheduled.  She also recommended warm massage and checking some additional labs.  Patient chose to have labs drawn in 2 weeks at her next injection appointment.  Orders placed.   Patient tolerated treatment well with no complaints voiced.  Patient left ambulatory in stable condition.  Vital signs stable at discharge.  Follow up as scheduled.

## 2023-06-08 NOTE — Progress Notes (Addendum)
 Per RN, patient reports increased muscle spasms in the legs.  She does have chronic leg pain and spasms, especially at night, and has been advised to check with her PCP for possible treatment of restless leg syndrome or other cause.  It is possible that her symptoms may be worsened by most recent iron.  I have advised warm massage.  Also offered to check electrolyte levels today, but patient wishes to wait until next lab draw in 2 weeks.  We will check CMP, magnesium, and phosphorus levels.  Sheril Dines PA-C 06/08/2023 3:54 PM

## 2023-06-08 NOTE — Patient Instructions (Signed)
 [facility] [place] - A DEPT OF [facility]  Discharge Instructions: Thank you for choosing [facility] to provide your oncology and hematology care.  If you have a lab appointment with the Cancer Center - please note that after April 8th, 2024, all labs will be drawn in the cancer center.  You do not have to check in or register with the main entrance as you have in the past but will complete your check-in in the cancer center.  Wear comfortable clothing and clothing appropriate for easy access to any Portacath or PICC line.   We strive to give you quality time with your provider. You may need to reschedule your appointment if you arrive late (15 or more minutes).  Arriving late affects you and other patients whose appointments are after yours.  Also, if you miss three or more appointments without notifying the office, you may be dismissed from the clinic at the provider's discretion.      For prescription refill requests, have your pharmacy contact our office and allow 72 hours for refills to be completed.    Today you received the following :  Retacrit.  Epoetin Alfa Injection What is this medication? EPOETIN ALFA (e POE e tin AL fa) treats low levels of red blood cells (anemia) caused by kidney disease, chemotherapy, or HIV medications. It can also be used in people who are at risk for blood loss during surgery. It works by Systems analyst make more red blood cells, which reduces the need for blood transfusions. This medicine may be used for other purposes; ask your health care provider or pharmacist if you have questions. COMMON BRAND NAME(S): Epogen, Procrit, Retacrit What should I tell my care team before I take this medication? They need to know if you have any of these conditions: Blood clots Cancer Heart disease High blood pressure On dialysis Seizures Stroke An unusual or allergic reaction to epoetin alfa, albumin, benzyl alcohol, other  medications, foods, dyes, or preservatives Pregnant or trying to get pregnant Breast-feeding How should I use this medication? This medication is injected into a vein or under the skin. It is usually given by your care team in a hospital or clinic setting. It may also be given at home. If you get this medication at home, you will be taught how to prepare and give it. Use exactly as directed. Take it as directed on the prescription label at the same time every day. Keep taking it unless your care team tells you to stop. It is important that you put your used needles and syringes in a special sharps container. Do not put them in a trash can. If you do not have a sharps container, call your pharmacist or care team to get one. A special MedGuide will be given to you by the pharmacist with each prescription and refill. Be sure to read this information carefully each time. Talk to your care team about the use of this medication in children. While this medication may be used in children as young as 1 month of age for selected conditions, precautions do apply. Overdosage: If you think you have taken too much of this medicine contact a poison control center or emergency room at once. NOTE: This medicine is only for you. Do not share this medicine with others. What if I miss a dose? If you miss a dose, take it as soon as you can. If it is almost time for your  next dose, take only that dose. Do not take double or extra doses. What may interact with this medication? Darbepoetin alfa Methoxy polyethylene glycol-epoetin beta This list may not describe all possible interactions. Give your health care provider a list of all the medicines, herbs, non-prescription drugs, or dietary supplements you use. Also tell them if you smoke, drink alcohol, or use illegal drugs. Some items may interact with your medicine. What should I watch for while using this medication? Visit your care team for regular checks on your  progress. Check your blood pressure as directed. Know what your blood pressure should be and when to contact your care team. Your condition will be monitored carefully while you are receiving this medication. You may need blood work while taking this medication. What side effects may I notice from receiving this medication? Side effects that you should report to your care team as soon as possible: Allergic reactions--skin rash, itching, hives, swelling of the face, lips, tongue, or throat Blood clot--pain, swelling, or warmth in the leg, shortness of breath, chest pain Heart attack--pain or tightness in the chest, shoulders, arms, or jaw, nausea, shortness of breath, cold or clammy skin, feeling faint or lightheaded Increase in blood pressure Rash, fever, and swollen lymph nodes Redness, blistering, peeling, or loosening of the skin, including inside the mouth Seizures Stroke--sudden numbness or weakness of the face, arm, or leg, trouble speaking, confusion, trouble walking, loss of balance or coordination, dizziness, severe headache, change in vision Side effects that usually do not require medical attention (report to your care team if they continue or are bothersome): Bone, joint, or muscle pain Cough Headache Nausea Pain, redness, or irritation at injection site This list may not describe all possible side effects. Call your doctor for medical advice about side effects. You may report side effects to FDA at 1-800-FDA-1088. Where should I keep my medication? Keep out of the reach of children and pets. Store in a refrigerator. Do not freeze. Do not shake. Protect from light. Keep this medication in the original container until you are ready to take it. See product for storage information. Get rid of any unused medication after the expiration date. To get rid of medications that are no longer needed or have expired: Take the medication to a medication take-back program. Check with your  pharmacy or law enforcement to find a location. If you cannot return the medication, ask your pharmacist or care team how to get rid of the medication safely. NOTE: This sheet is a summary. It may not cover all possible information. If you have questions about this medicine, talk to your doctor, pharmacist, or health care provider.  2024 Elsevier/Gold Standard (2021-06-05 00:00:00)    To help prevent nausea and vomiting after your treatment, we encourage you to take your nausea medication as directed.  BELOW ARE SYMPTOMS THAT SHOULD BE REPORTED IMMEDIATELY: *FEVER GREATER THAN 100.4 F (38 C) OR HIGHER *CHILLS OR SWEATING *NAUSEA AND VOMITING THAT IS NOT CONTROLLED WITH YOUR NAUSEA MEDICATION *UNUSUAL SHORTNESS OF BREATH *UNUSUAL BRUISING OR BLEEDING *URINARY PROBLEMS (pain or burning when urinating, or frequent urination) *BOWEL PROBLEMS (unusual diarrhea, constipation, pain near the anus) TENDERNESS IN MOUTH AND THROAT WITH OR WITHOUT PRESENCE OF ULCERS (sore throat, sores in mouth, or a toothache) UNUSUAL RASH, SWELLING OR PAIN  UNUSUAL VAGINAL DISCHARGE OR ITCHING   Items with * indicate a potential emergency and should be followed up as soon as possible or go to the Emergency Department if any problems should  occur.  Please show the CHEMOTHERAPY ALERT CARD or IMMUNOTHERAPY ALERT CARD at check-in to the Emergency Department and triage nurse.  Should you have questions after your visit or need to cancel or reschedule your appointment, please contact [facility] [place] - A DEPT OF [facility] [phone]  and follow the prompts.  Office hours are 8:00 a.m. to 4:30 p.m. Monday - Friday. Please note that voicemails left after 4:00 p.m. may not be returned until the following business day.  We are closed weekends and major holidays. You have access to a nurse at all times for urgent questions. Please call the main number to the clinic [phone] and follow the  prompts.  For any non-urgent questions, you may also contact your provider using MyChart. We now offer e-Visits for anyone 73 and older to request care online for non-urgent symptoms. For details visit [email or web address].   Also download the MyChart app! Go to the app store, search "MyChart", open the app, select [place], and log in with your MyChart username and password.

## 2023-06-13 ENCOUNTER — Other Ambulatory Visit: Payer: Self-pay

## 2023-06-21 ENCOUNTER — Other Ambulatory Visit: Payer: Self-pay | Admitting: Physician Assistant

## 2023-06-21 DIAGNOSIS — E1165 Type 2 diabetes mellitus with hyperglycemia: Secondary | ICD-10-CM

## 2023-06-21 MED ORDER — FREESTYLE LIBRE 3 PLUS SENSOR MISC: 3 refills | Status: DC

## 2023-06-21 NOTE — Telephone Encounter (Signed)
-  spoke with daughter Jeanette Chang and she states she was previously given a sample of the freestyle libre 3 and it has ran out. Please advise next steps . She uses [facility] in [place]   Copied from CRM [id number]. Topic: Clinical - Prescription Issue >> Jun 21, 2023  8:12 AM [name] wrote: Reason for CRM: Pt's daughter stated that her Hubert Madden monitor has ran out today, and wants to pickup one in office or if a prescription could be called into the pharmacy into the pharmacy. Callback number is [phone] Jeanette Chang is her daughter.

## 2023-06-22 ENCOUNTER — Other Ambulatory Visit: Payer: Self-pay | Admitting: Physician Assistant

## 2023-06-22 ENCOUNTER — Inpatient Hospital Stay

## 2023-06-22 ENCOUNTER — Inpatient Hospital Stay: Attending: Hematology

## 2023-06-22 VITALS — BP 152/49 | HR 64 | Temp 98.0°F | Resp 18

## 2023-06-22 DIAGNOSIS — D500 Iron deficiency anemia secondary to blood loss (chronic): Secondary | ICD-10-CM

## 2023-06-22 DIAGNOSIS — D696 Thrombocytopenia, unspecified: Secondary | ICD-10-CM

## 2023-06-22 DIAGNOSIS — N1832 Chronic kidney disease, stage 3b: Secondary | ICD-10-CM | POA: Insufficient documentation

## 2023-06-22 DIAGNOSIS — D631 Anemia in chronic kidney disease: Secondary | ICD-10-CM | POA: Diagnosis not present

## 2023-06-22 DIAGNOSIS — D509 Iron deficiency anemia, unspecified: Secondary | ICD-10-CM

## 2023-06-22 DIAGNOSIS — M62838 Other muscle spasm: Secondary | ICD-10-CM

## 2023-06-22 LAB — CBC
HCT: 33.4 % — ABNORMAL LOW (ref 36.0–46.0)
Hemoglobin: 10.4 g/dL — ABNORMAL LOW (ref 12.0–15.0)
MCH: 32.6 pg (ref 26.0–34.0)
MCHC: 31.1 g/dL (ref 30.0–36.0)
MCV: 104.7 fL — ABNORMAL HIGH (ref 80.0–100.0)
Platelets: 36 10*3/uL — ABNORMAL LOW (ref 150–400)
RBC: 3.19 MIL/uL — ABNORMAL LOW (ref 3.87–5.11)
RDW: 15.6 % — ABNORMAL HIGH (ref 11.5–15.5)
WBC: 1.7 10*3/uL — ABNORMAL LOW (ref 4.0–10.5)
nRBC: 0 % (ref 0.0–0.2)

## 2023-06-22 LAB — COMPREHENSIVE METABOLIC PANEL WITH GFR
ALT: 60 U/L — ABNORMAL HIGH (ref 0–44)
AST: 40 U/L (ref 15–41)
Albumin: 3.5 g/dL (ref 3.5–5.0)
Alkaline Phosphatase: 96 U/L (ref 38–126)
Anion gap: 10 (ref 5–15)
BUN: 27 mg/dL — ABNORMAL HIGH (ref 8–23)
CO2: 26 mmol/L (ref 22–32)
Calcium: 9.1 mg/dL (ref 8.9–10.3)
Chloride: 103 mmol/L (ref 98–111)
Creatinine, Ser: 0.98 mg/dL (ref 0.44–1.00)
GFR, Estimated: 57 mL/min — ABNORMAL LOW (ref >60)
Glucose, Bld: 294 mg/dL — ABNORMAL HIGH (ref 70–99)
Potassium: 4.4 mmol/L (ref 3.5–5.1)
Sodium: 139 mmol/L (ref 135–145)
Total Bilirubin: 0.8 mg/dL (ref 0.0–1.2)
Total Protein: 6.6 g/dL (ref 6.5–8.1)

## 2023-06-22 LAB — SAMPLE TO BLOOD BANK

## 2023-06-22 LAB — PHOSPHORUS: Phosphorus: 3.6 mg/dL (ref 2.5–4.6)

## 2023-06-22 LAB — MAGNESIUM: Magnesium: 1.6 mg/dL — ABNORMAL LOW (ref 1.7–2.4)

## 2023-06-22 MED ORDER — MAGNESIUM OXIDE -MG SUPPLEMENT 400 (240 MG) MG PO TABS: 400.0000 mg | ORAL_TABLET | Freq: Every day | ORAL | 11 refills | Status: DC

## 2023-06-22 MED ORDER — EPOETIN ALFA-EPBX 10000 UNIT/ML IJ SOLN
10000.0000 [IU] | Freq: Once | INTRAMUSCULAR | Status: AC
  Administered 2023-06-22: 10000 [IU] via SUBCUTANEOUS
  Filled 2023-06-22: qty 1

## 2023-06-22 NOTE — Patient Instructions (Signed)
 [facility] [place] - A DEPT OF [facility]  Discharge Instructions: Thank you for choosing [facility] to provide your oncology and hematology care.  If you have a lab appointment with the Cancer Center - please note that after April 8th, 2024, all labs will be drawn in the cancer center.  You do not have to check in or register with the main entrance as you have in the past but will complete your check-in in the cancer center.  Wear comfortable clothing and clothing appropriate for easy access to any Portacath or PICC line.   We strive to give you quality time with your provider. You may need to reschedule your appointment if you arrive late (15 or more minutes).  Arriving late affects you and other patients whose appointments are after yours.  Also, if you miss three or more appointments without notifying the office, you may be dismissed from the clinic at the provider's discretion.      For prescription refill requests, have your pharmacy contact our office and allow 72 hours for refills to be completed.    Today you received the following retacrit, return as scheduled.   To help prevent nausea and vomiting after your treatment, we encourage you to take your nausea medication as directed.  BELOW ARE SYMPTOMS THAT SHOULD BE REPORTED IMMEDIATELY: *FEVER GREATER THAN 100.4 F (38 C) OR HIGHER *CHILLS OR SWEATING *NAUSEA AND VOMITING THAT IS NOT CONTROLLED WITH YOUR NAUSEA MEDICATION *UNUSUAL SHORTNESS OF BREATH *UNUSUAL BRUISING OR BLEEDING *URINARY PROBLEMS (pain or burning when urinating, or frequent urination) *BOWEL PROBLEMS (unusual diarrhea, constipation, pain near the anus) TENDERNESS IN MOUTH AND THROAT WITH OR WITHOUT PRESENCE OF ULCERS (sore throat, sores in mouth, or a toothache) UNUSUAL RASH, SWELLING OR PAIN  UNUSUAL VAGINAL DISCHARGE OR ITCHING   Items with * indicate a potential emergency and should be followed up as soon as possible or  go to the Emergency Department if any problems should occur.  Please show the CHEMOTHERAPY ALERT CARD or IMMUNOTHERAPY ALERT CARD at check-in to the Emergency Department and triage nurse.  Should you have questions after your visit or need to cancel or reschedule your appointment, please contact [facility] [place] - A DEPT OF [facility] [phone]  and follow the prompts.  Office hours are 8:00 a.m. to 4:30 p.m. Monday - Friday. Please note that voicemails left after 4:00 p.m. may not be returned until the following business day.  We are closed weekends and major holidays. You have access to a nurse at all times for urgent questions. Please call the main number to the clinic [phone] and follow the prompts.  For any non-urgent questions, you may also contact your provider using MyChart. We now offer e-Visits for anyone 40 and older to request care online for non-urgent symptoms. For details visit [email or web address].   Also download the MyChart app! Go to the app store, search "MyChart", open the app, select [place], and log in with your MyChart username and password.

## 2023-06-22 NOTE — Progress Notes (Addendum)
 Electrolyte levels checked today due to patient complaint of leg cramping.  Mild hypomagnesemia at 1.6. Rx to pharmacy for magnesium oxide 400 mg daily. Will recheck magnesium with her labs in 2 weeks.  NOTE: We will also continue to watch CBC trend closely.  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  Sonnie Dusky, PA-C 06/22/23 2:25 PM

## 2023-06-22 NOTE — Progress Notes (Signed)
 Patient presents today for Retacrit , hemoglobin 10.4. Magnesium 1.6 Sheril Dines PA made aware, per PA PO magnesium sent to patient's pharmacy. VSS tolerated without incident or complaint. See MAR for details. Patient stable during and after injection. Patient discharged in satisfactory condition with no s/s of distress noted.

## 2023-06-22 NOTE — Telephone Encounter (Signed)
 Confirmed with pharmacy novolog  has been picked up by the patient, cost of $35 , however freestyle libre sensor is waiting for additional supporting documentation - this has been forwarded to front staff for faxing .

## 2023-06-28 DIAGNOSIS — H353132 Nonexudative age-related macular degeneration, bilateral, intermediate dry stage: Secondary | ICD-10-CM | POA: Diagnosis not present

## 2023-06-28 DIAGNOSIS — H4323 Crystalline deposits in vitreous body, bilateral: Secondary | ICD-10-CM | POA: Diagnosis not present

## 2023-07-05 ENCOUNTER — Encounter: Payer: Self-pay | Admitting: Physician Assistant

## 2023-07-05 ENCOUNTER — Ambulatory Visit (INDEPENDENT_AMBULATORY_CARE_PROVIDER_SITE_OTHER): Admitting: Physician Assistant

## 2023-07-05 VITALS — BP 177/68 | HR 76 | Temp 98.2°F | Ht 63.0 in | Wt 143.0 lb

## 2023-07-05 DIAGNOSIS — R300 Dysuria: Secondary | ICD-10-CM

## 2023-07-05 DIAGNOSIS — I10 Essential (primary) hypertension: Secondary | ICD-10-CM | POA: Diagnosis not present

## 2023-07-05 DIAGNOSIS — M545 Low back pain, unspecified: Secondary | ICD-10-CM | POA: Insufficient documentation

## 2023-07-05 LAB — POCT URINALYSIS DIP (CLINITEK)
Bilirubin, UA: NEGATIVE
Glucose, UA: NEGATIVE mg/dL
Ketones, POC UA: NEGATIVE mg/dL
Leukocytes, UA: NEGATIVE
Nitrite, UA: NEGATIVE
POC PROTEIN,UA: NEGATIVE
Spec Grav, UA: 1.01 (ref 1.010–1.025)
Urobilinogen, UA: 0.2 U/dL — NL
pH, UA: 7.5 (ref 5.0–8.0)

## 2023-07-05 MED ORDER — LOSARTAN POTASSIUM 50 MG PO TABS: 50.0000 mg | ORAL_TABLET | Freq: Every day | ORAL | 2 refills | Status: DC

## 2023-07-05 NOTE — Assessment & Plan Note (Signed)
 Patient presents today at her daughters request for intermittent back pain. Overall negative physical exam, no tenderness on exam. No CVA tenderness. No sciatica symptoms. No weakness. Normal urine dipstick. Reassurance given. Patient advised tylenol  and heating pad as needed. Warning signs and follow up precautions discussed.

## 2023-07-05 NOTE — Progress Notes (Signed)
 Acute Office Visit  Subjective:     Patient ID: Jeanette Chang, female    DOB: 04/22/1940, 83 y.o.   MRN: [id number]   Patient presents today at request of her daughter for intermittent mid to low back pain. She denies symptoms today. She states intermittent back pain after prolonged activity or long periods of sitting. She denies sciatica symptoms. She admits occasional dysuria. Denies frequency or urgency. Denies fevers. She states she is not concerned about her symptoms and is only here for her daughter.      Review of Systems  Constitutional:  Negative for chills, fever, malaise/fatigue and weight loss.  Respiratory:  Negative for shortness of breath and wheezing.   Cardiovascular:  Negative for chest pain and leg swelling.  Gastrointestinal:  Negative for abdominal pain.  Genitourinary:  Positive for dysuria. Negative for flank pain, frequency, hematuria and urgency.  Musculoskeletal:  Positive for back pain and joint pain. Negative for falls, myalgias and neck pain.  Neurological:  Negative for tingling, sensory change and weakness.        Objective:     BP (!) 177/68   Pulse 76   Temp 98.2 F (36.8 C)   Ht 5\' 3"  (1.6 m)   Wt 143 lb (64.9 kg)   SpO2 96%   BMI 25.33 kg/m   Physical Exam Constitutional:      Appearance: Normal appearance.  HENT:     Head: Normocephalic and atraumatic.     Mouth/Throat:     Mouth: Mucous membranes are moist.     Pharynx: Oropharynx is clear.  Eyes:     Extraocular Movements: Extraocular movements intact.     Conjunctiva/sclera: Conjunctivae normal.  Cardiovascular:     Rate and Rhythm: Normal rate and regular rhythm.     Heart sounds: Normal heart sounds. No murmur heard. Pulmonary:     Effort: Pulmonary effort is normal.     Breath sounds: No wheezing, rhonchi or rales.  Abdominal:     General: Abdomen is flat.     Palpations: Abdomen is soft.     Tenderness: There is no right CVA tenderness or left CVA tenderness.   Musculoskeletal:     Thoracic back: No deformity, spasms, tenderness or bony tenderness. Normal range of motion.     Lumbar back: No deformity, spasms, tenderness or bony tenderness. Normal range of motion.     Right lower leg: No edema.     Left lower leg: No edema.  Skin:    General: Skin is warm and dry.  Neurological:     General: No focal deficit present.     Mental Status: She is alert and oriented to person, place, and time.  Psychiatric:        Mood and Affect: Mood normal.        Behavior: Behavior normal.     Results for orders placed or performed in visit on 07/05/23  POCT URINALYSIS DIP (CLINITEK)  Result Value Ref Range   Color, UA light yellow (A) yellow   Clarity, UA clear clear   Glucose, UA negative negative mg/dL   Bilirubin, UA negative negative   Ketones, POC UA negative negative mg/dL   Spec Grav, UA 4.098 1.191 - 1.025   Blood, UA trace-intact (A) negative   pH, UA 7.5 5.0 - 8.0   POC PROTEIN,UA negative negative, trace   Urobilinogen, UA 0.2 0.2 or 1.0 E.U./dL   Nitrite, UA Negative Negative   Leukocytes, UA Negative Negative  Assessment & Plan:  Primary hypertension Assessment & Plan: 161/72, 177/68 Uncontrolled. Continue current medications. Increase losartan to 50mg  daily. Discussed DASH diet and dietary sodium restrictions.  Continue dietary efforts and physical activity.   Orders: -     Losartan Potassium; Take 1 tablet (50 mg total) by mouth daily.  Dispense: 30 tablet; Refill: 2  Intermittent low back pain Assessment & Plan: Patient presents today at her daughters request for intermittent back pain. Overall negative physical exam, no tenderness on exam. No CVA tenderness. No sciatica symptoms. No weakness. Normal urine dipstick. Reassurance given. Patient advised tylenol  and heating pad as needed. Warning signs and follow up precautions discussed.    Dysuria -     POCT URINALYSIS DIP (CLINITEK)     Return in about 2  months (around 09/04/2023) for prev scheduled appt .  Jearlean Mince Karma Hiney, PA-C

## 2023-07-05 NOTE — Assessment & Plan Note (Signed)
 161/72, 177/68 Uncontrolled. Continue current medications. Increase losartan to 50mg  daily. Discussed DASH diet and dietary sodium restrictions.  Continue dietary efforts and physical activity.

## 2023-07-06 ENCOUNTER — Inpatient Hospital Stay

## 2023-07-06 VITALS — BP 154/52 | HR 67 | Temp 97.8°F | Resp 18

## 2023-07-06 DIAGNOSIS — D500 Iron deficiency anemia secondary to blood loss (chronic): Secondary | ICD-10-CM

## 2023-07-06 DIAGNOSIS — N1831 Chronic kidney disease, stage 3a: Secondary | ICD-10-CM

## 2023-07-06 DIAGNOSIS — N1832 Chronic kidney disease, stage 3b: Secondary | ICD-10-CM | POA: Diagnosis not present

## 2023-07-06 DIAGNOSIS — D631 Anemia in chronic kidney disease: Secondary | ICD-10-CM | POA: Diagnosis not present

## 2023-07-06 DIAGNOSIS — D696 Thrombocytopenia, unspecified: Secondary | ICD-10-CM

## 2023-07-06 DIAGNOSIS — D509 Iron deficiency anemia, unspecified: Secondary | ICD-10-CM

## 2023-07-06 LAB — SAMPLE TO BLOOD BANK

## 2023-07-06 LAB — CBC
HCT: 33.8 % — ABNORMAL LOW (ref 36.0–46.0)
Hemoglobin: 10.4 g/dL — ABNORMAL LOW (ref 12.0–15.0)
MCH: 31.9 pg (ref 26.0–34.0)
MCHC: 30.8 g/dL (ref 30.0–36.0)
MCV: 103.7 fL — ABNORMAL HIGH (ref 80.0–100.0)
Platelets: 36 10*3/uL — ABNORMAL LOW (ref 150–400)
RBC: 3.26 MIL/uL — ABNORMAL LOW (ref 3.87–5.11)
RDW: 14.8 % (ref 11.5–15.5)
WBC: 2.3 10*3/uL — ABNORMAL LOW (ref 4.0–10.5)
nRBC: 0 % (ref 0.0–0.2)

## 2023-07-06 LAB — MAGNESIUM: Magnesium: 1.9 mg/dL (ref 1.7–2.4)

## 2023-07-06 MED ORDER — EPOETIN ALFA-EPBX 10000 UNIT/ML IJ SOLN
10000.0000 [IU] | Freq: Once | INTRAMUSCULAR | Status: AC
  Administered 2023-07-06: 10000 [IU] via SUBCUTANEOUS
  Filled 2023-07-06: qty 1

## 2023-07-06 NOTE — Patient Instructions (Signed)

## 2023-07-06 NOTE — Progress Notes (Signed)
 Patient's Hgb 10.4 and blood pressure stable. Patient tolerated Retacrit injection with no complaints voiced.  Site clean and dry with no bruising or swelling noted at site.  See MAR for details.  Band aid applied.  Patient stable during and after injection.  Vss with discharge and left in satisfactory condition with no s/s of distress noted. All follow ups as scheduled.   Jeanette Chang [employer]

## 2023-07-20 ENCOUNTER — Inpatient Hospital Stay

## 2023-07-20 ENCOUNTER — Other Ambulatory Visit: Payer: Self-pay

## 2023-07-20 DIAGNOSIS — E1165 Type 2 diabetes mellitus with hyperglycemia: Secondary | ICD-10-CM

## 2023-07-20 MED ORDER — FREESTYLE LIBRE 3 PLUS SENSOR MISC: 3 refills | Status: DC

## 2023-07-21 ENCOUNTER — Other Ambulatory Visit: Payer: Self-pay

## 2023-07-21 DIAGNOSIS — D509 Iron deficiency anemia, unspecified: Secondary | ICD-10-CM

## 2023-07-21 DIAGNOSIS — N1831 Chronic kidney disease, stage 3a: Secondary | ICD-10-CM

## 2023-07-22 ENCOUNTER — Inpatient Hospital Stay: Attending: Hematology

## 2023-07-22 ENCOUNTER — Inpatient Hospital Stay

## 2023-07-22 VITALS — BP 129/47 | HR 64 | Temp 98.2°F | Resp 18

## 2023-07-22 DIAGNOSIS — D500 Iron deficiency anemia secondary to blood loss (chronic): Secondary | ICD-10-CM

## 2023-07-22 DIAGNOSIS — D631 Anemia in chronic kidney disease: Secondary | ICD-10-CM | POA: Insufficient documentation

## 2023-07-22 DIAGNOSIS — N1832 Chronic kidney disease, stage 3b: Secondary | ICD-10-CM | POA: Insufficient documentation

## 2023-07-22 DIAGNOSIS — D509 Iron deficiency anemia, unspecified: Secondary | ICD-10-CM

## 2023-07-22 DIAGNOSIS — N1831 Chronic kidney disease, stage 3a: Secondary | ICD-10-CM

## 2023-07-22 DIAGNOSIS — D696 Thrombocytopenia, unspecified: Secondary | ICD-10-CM

## 2023-07-22 LAB — CBC
HCT: 31.6 % — ABNORMAL LOW (ref 36.0–46.0)
Hemoglobin: 10.2 g/dL — ABNORMAL LOW (ref 12.0–15.0)
MCH: 33.9 pg (ref 26.0–34.0)
MCHC: 32.3 g/dL (ref 30.0–36.0)
MCV: 105 fL — ABNORMAL HIGH (ref 80.0–100.0)
Platelets: 43 10*3/uL — ABNORMAL LOW (ref 150–400)
RBC: 3.01 MIL/uL — ABNORMAL LOW (ref 3.87–5.11)
RDW: 15.1 % (ref 11.5–15.5)
WBC: 2.1 10*3/uL — ABNORMAL LOW (ref 4.0–10.5)
nRBC: 0 % (ref 0.0–0.2)

## 2023-07-22 LAB — SAMPLE TO BLOOD BANK

## 2023-07-22 MED ORDER — EPOETIN ALFA-EPBX 10000 UNIT/ML IJ SOLN
10000.0000 [IU] | Freq: Once | INTRAMUSCULAR | Status: AC
  Administered 2023-07-22: 10000 [IU] via SUBCUTANEOUS
  Filled 2023-07-22: qty 1

## 2023-07-22 NOTE — Progress Notes (Signed)
 Patient presents today for Retacrit  injection per providers order.  Hgb noted to be 10.2.  Vital signs within parameters for injection.  Patient has no new complaints at this time.

## 2023-07-22 NOTE — Patient Instructions (Signed)

## 2023-07-22 NOTE — Progress Notes (Signed)
Patient presents today for Retacrit injection. Hemoglobin reviewed prior to administration. VSS tolerated without incident or complaint. See MAR for details. Patient stable during and after injection. Patient discharged in satisfactory condition with no s/s of distress noted.  

## 2023-08-02 ENCOUNTER — Other Ambulatory Visit: Payer: Self-pay

## 2023-08-02 DIAGNOSIS — D509 Iron deficiency anemia, unspecified: Secondary | ICD-10-CM

## 2023-08-02 DIAGNOSIS — D631 Anemia in chronic kidney disease: Secondary | ICD-10-CM

## 2023-08-03 ENCOUNTER — Inpatient Hospital Stay

## 2023-08-03 VITALS — BP 127/55 | HR 66 | Temp 97.8°F | Resp 20

## 2023-08-03 DIAGNOSIS — D631 Anemia in chronic kidney disease: Secondary | ICD-10-CM

## 2023-08-03 DIAGNOSIS — D509 Iron deficiency anemia, unspecified: Secondary | ICD-10-CM

## 2023-08-03 DIAGNOSIS — N1832 Chronic kidney disease, stage 3b: Secondary | ICD-10-CM | POA: Diagnosis not present

## 2023-08-03 LAB — COMPREHENSIVE METABOLIC PANEL WITH GFR
ALT: 25 U/L (ref 0–44)
AST: 32 U/L (ref 15–41)
Albumin: 3.5 g/dL (ref 3.5–5.0)
Alkaline Phosphatase: 72 U/L (ref 38–126)
Anion gap: 13 (ref 5–15)
BUN: 40 mg/dL — ABNORMAL HIGH (ref 8–23)
CO2: 20 mmol/L — ABNORMAL LOW (ref 22–32)
Calcium: 9.2 mg/dL (ref 8.9–10.3)
Chloride: 106 mmol/L (ref 98–111)
Creatinine, Ser: 1.28 mg/dL — ABNORMAL HIGH (ref 0.44–1.00)
GFR, Estimated: 41 mL/min — ABNORMAL LOW (ref >60)
Glucose, Bld: 199 mg/dL — ABNORMAL HIGH (ref 70–99)
Potassium: 4.4 mmol/L (ref 3.5–5.1)
Sodium: 139 mmol/L (ref 135–145)
Total Bilirubin: 0.5 mg/dL (ref 0.0–1.2)
Total Protein: 6.5 g/dL (ref 6.5–8.1)

## 2023-08-03 LAB — CBC WITH DIFFERENTIAL/PLATELET
Abs Immature Granulocytes: 0.01 10*3/uL (ref 0.00–0.07)
Basophils Absolute: 0 10*3/uL (ref 0.0–0.1)
Basophils Relative: 0 %
Eosinophils Absolute: 0.1 10*3/uL (ref 0.0–0.5)
Eosinophils Relative: 2 %
HCT: 31.2 % — ABNORMAL LOW (ref 36.0–46.0)
Hemoglobin: 9.8 g/dL — ABNORMAL LOW (ref 12.0–15.0)
Immature Granulocytes: 0 %
Lymphocytes Relative: 39 %
Lymphs Abs: 1 10*3/uL (ref 0.7–4.0)
MCH: 32.5 pg (ref 26.0–34.0)
MCHC: 31.4 g/dL (ref 30.0–36.0)
MCV: 103.3 fL — ABNORMAL HIGH (ref 80.0–100.0)
Monocytes Absolute: 0.2 10*3/uL (ref 0.1–1.0)
Monocytes Relative: 7 %
Neutro Abs: 1.3 10*3/uL — ABNORMAL LOW (ref 1.7–7.7)
Neutrophils Relative %: 52 %
Platelets: 43 10*3/uL — ABNORMAL LOW (ref 150–400)
RBC: 3.02 MIL/uL — ABNORMAL LOW (ref 3.87–5.11)
RDW: 14.6 % (ref 11.5–15.5)
WBC: 2.5 10*3/uL — ABNORMAL LOW (ref 4.0–10.5)
nRBC: 0 % (ref 0.0–0.2)

## 2023-08-03 LAB — SAMPLE TO BLOOD BANK

## 2023-08-03 LAB — IRON AND TIBC
Iron: 62 ug/dL (ref 28–170)
Saturation Ratios: 16 % (ref 10.4–31.8)
TIBC: 393 ug/dL (ref 250–450)
UIBC: 331 ug/dL

## 2023-08-03 LAB — FERRITIN: Ferritin: 55 ng/mL (ref 11–307)

## 2023-08-03 MED ORDER — EPOETIN ALFA-EPBX 10000 UNIT/ML IJ SOLN
10000.0000 [IU] | Freq: Once | INTRAMUSCULAR | Status: AC
  Administered 2023-08-03: 10000 [IU] via SUBCUTANEOUS
  Filled 2023-08-03: qty 1

## 2023-08-03 NOTE — Patient Instructions (Signed)
 [facility] [place] - A DEPT OF [facility]  Discharge Instructions: Thank you for choosing [facility] to provide your oncology and hematology care.  If you have a lab appointment with the Cancer Center - please note that after April 8th, 2024, all labs will be drawn in the cancer center.  You do not have to check in or register with the main entrance as you have in the past but will complete your check-in in the cancer center.  Wear comfortable clothing and clothing appropriate for easy access to any Portacath or PICC line.   We strive to give you quality time with your provider. You may need to reschedule your appointment if you arrive late (15 or more minutes).  Arriving late affects you and other patients whose appointments are after yours.  Also, if you miss three or more appointments without notifying the office, you may be dismissed from the clinic at the provider's discretion.      For prescription refill requests, have your pharmacy contact our office and allow 72 hours for refills to be completed.    Today you received the following :  Retacrit.  Epoetin Alfa Injection What is this medication? EPOETIN ALFA (e POE e tin AL fa) treats low levels of red blood cells (anemia) caused by kidney disease, chemotherapy, or HIV medications. It can also be used in people who are at risk for blood loss during surgery. It works by Systems analyst make more red blood cells, which reduces the need for blood transfusions. This medicine may be used for other purposes; ask your health care provider or pharmacist if you have questions. COMMON BRAND NAME(S): Epogen, Procrit, Retacrit What should I tell my care team before I take this medication? They need to know if you have any of these conditions: Blood clots Cancer Heart disease High blood pressure On dialysis Seizures Stroke An unusual or allergic reaction to epoetin alfa, albumin, benzyl alcohol, other  medications, foods, dyes, or preservatives Pregnant or trying to get pregnant Breast-feeding How should I use this medication? This medication is injected into a vein or under the skin. It is usually given by your care team in a hospital or clinic setting. It may also be given at home. If you get this medication at home, you will be taught how to prepare and give it. Use exactly as directed. Take it as directed on the prescription label at the same time every day. Keep taking it unless your care team tells you to stop. It is important that you put your used needles and syringes in a special sharps container. Do not put them in a trash can. If you do not have a sharps container, call your pharmacist or care team to get one. A special MedGuide will be given to you by the pharmacist with each prescription and refill. Be sure to read this information carefully each time. Talk to your care team about the use of this medication in children. While this medication may be used in children as young as 1 month of age for selected conditions, precautions do apply. Overdosage: If you think you have taken too much of this medicine contact a poison control center or emergency room at once. NOTE: This medicine is only for you. Do not share this medicine with others. What if I miss a dose? If you miss a dose, take it as soon as you can. If it is almost time for your  next dose, take only that dose. Do not take double or extra doses. What may interact with this medication? Darbepoetin alfa Methoxy polyethylene glycol-epoetin beta This list may not describe all possible interactions. Give your health care provider a list of all the medicines, herbs, non-prescription drugs, or dietary supplements you use. Also tell them if you smoke, drink alcohol, or use illegal drugs. Some items may interact with your medicine. What should I watch for while using this medication? Visit your care team for regular checks on your  progress. Check your blood pressure as directed. Know what your blood pressure should be and when to contact your care team. Your condition will be monitored carefully while you are receiving this medication. You may need blood work while taking this medication. What side effects may I notice from receiving this medication? Side effects that you should report to your care team as soon as possible: Allergic reactions--skin rash, itching, hives, swelling of the face, lips, tongue, or throat Blood clot--pain, swelling, or warmth in the leg, shortness of breath, chest pain Heart attack--pain or tightness in the chest, shoulders, arms, or jaw, nausea, shortness of breath, cold or clammy skin, feeling faint or lightheaded Increase in blood pressure Rash, fever, and swollen lymph nodes Redness, blistering, peeling, or loosening of the skin, including inside the mouth Seizures Stroke--sudden numbness or weakness of the face, arm, or leg, trouble speaking, confusion, trouble walking, loss of balance or coordination, dizziness, severe headache, change in vision Side effects that usually do not require medical attention (report to your care team if they continue or are bothersome): Bone, joint, or muscle pain Cough Headache Nausea Pain, redness, or irritation at injection site This list may not describe all possible side effects. Call your doctor for medical advice about side effects. You may report side effects to FDA at 1-800-FDA-1088. Where should I keep my medication? Keep out of the reach of children and pets. Store in a refrigerator. Do not freeze. Do not shake. Protect from light. Keep this medication in the original container until you are ready to take it. See product for storage information. Get rid of any unused medication after the expiration date. To get rid of medications that are no longer needed or have expired: Take the medication to a medication take-back program. Check with your  pharmacy or law enforcement to find a location. If you cannot return the medication, ask your pharmacist or care team how to get rid of the medication safely. NOTE: This sheet is a summary. It may not cover all possible information. If you have questions about this medicine, talk to your doctor, pharmacist, or health care provider.  2024 Elsevier/Gold Standard (2021-06-05 00:00:00)    To help prevent nausea and vomiting after your treatment, we encourage you to take your nausea medication as directed.  BELOW ARE SYMPTOMS THAT SHOULD BE REPORTED IMMEDIATELY: *FEVER GREATER THAN 100.4 F (38 C) OR HIGHER *CHILLS OR SWEATING *NAUSEA AND VOMITING THAT IS NOT CONTROLLED WITH YOUR NAUSEA MEDICATION *UNUSUAL SHORTNESS OF BREATH *UNUSUAL BRUISING OR BLEEDING *URINARY PROBLEMS (pain or burning when urinating, or frequent urination) *BOWEL PROBLEMS (unusual diarrhea, constipation, pain near the anus) TENDERNESS IN MOUTH AND THROAT WITH OR WITHOUT PRESENCE OF ULCERS (sore throat, sores in mouth, or a toothache) UNUSUAL RASH, SWELLING OR PAIN  UNUSUAL VAGINAL DISCHARGE OR ITCHING   Items with * indicate a potential emergency and should be followed up as soon as possible or go to the Emergency Department if any problems should  occur.  Please show the CHEMOTHERAPY ALERT CARD or IMMUNOTHERAPY ALERT CARD at check-in to the Emergency Department and triage nurse.  Should you have questions after your visit or need to cancel or reschedule your appointment, please contact [facility] [place] - A DEPT OF [facility] [phone]  and follow the prompts.  Office hours are 8:00 a.m. to 4:30 p.m. Monday - Friday. Please note that voicemails left after 4:00 p.m. may not be returned until the following business day.  We are closed weekends and major holidays. You have access to a nurse at all times for urgent questions. Please call the main number to the clinic [phone] and follow the  prompts.  For any non-urgent questions, you may also contact your provider using MyChart. We now offer e-Visits for anyone 73 and older to request care online for non-urgent symptoms. For details visit [email or web address].   Also download the MyChart app! Go to the app store, search "MyChart", open the app, select [place], and log in with your MyChart username and password.

## 2023-08-03 NOTE — Progress Notes (Signed)
 Hemoglobin today is 9.8.  We will proceed with Retacrit  injection per provider orders.   Patient tolerated Retacrit  injection with no complaints voiced.  Site clean and dry with no bruising or swelling noted.  No complaints of pain.  Discharged with vital signs stable and no signs or symptoms of distress noted.

## 2023-08-04 ENCOUNTER — Other Ambulatory Visit: Payer: Self-pay | Admitting: Physician Assistant

## 2023-08-04 NOTE — Progress Notes (Signed)
 Labs from 08/03/2023 show hemoglobin trending downward at 9.8, with low ferritin 55 and iron saturation 16%. Orders entered for IV Feraheme  x 1, to be given next week on 08/10/2023.

## 2023-08-10 ENCOUNTER — Inpatient Hospital Stay

## 2023-08-10 VITALS — BP 155/74 | HR 65 | Temp 97.2°F | Resp 18

## 2023-08-10 DIAGNOSIS — D631 Anemia in chronic kidney disease: Secondary | ICD-10-CM | POA: Diagnosis not present

## 2023-08-10 DIAGNOSIS — D509 Iron deficiency anemia, unspecified: Secondary | ICD-10-CM

## 2023-08-10 DIAGNOSIS — N1832 Chronic kidney disease, stage 3b: Secondary | ICD-10-CM | POA: Diagnosis not present

## 2023-08-10 MED ORDER — CETIRIZINE HCL 10 MG PO TABS
10.0000 mg | ORAL_TABLET | Freq: Once | ORAL | Status: AC
  Administered 2023-08-10: 10 mg via ORAL
  Filled 2023-08-10: qty 1

## 2023-08-10 MED ORDER — SODIUM CHLORIDE 0.9 % IV SOLN
INTRAVENOUS | Status: DC
Start: 2023-08-10 — End: 2023-08-10

## 2023-08-10 MED ORDER — ACETAMINOPHEN 325 MG PO TABS
650.0000 mg | ORAL_TABLET | Freq: Once | ORAL | Status: AC
  Administered 2023-08-10: 650 mg via ORAL
  Filled 2023-08-10: qty 2

## 2023-08-10 MED ORDER — SODIUM CHLORIDE 0.9 % IV SOLN
510.0000 mg | Freq: Once | INTRAVENOUS | Status: AC
  Administered 2023-08-10: 510 mg via INTRAVENOUS
  Filled 2023-08-10: qty 510

## 2023-08-10 NOTE — Progress Notes (Signed)
Patient presents today for Feraheme infusion per providers order.  Vital signs WNL.  Patient has no new complaints at this time.    Peripheral IV started and blood noted pre and post infusion.    Stable during infusion without adverse affects.  Vital signs stable.  No complaints at this time.  Discharge from clinic ambulatory in stable condition.  Alert and oriented X 3.  Follow up with [facility] as scheduled.

## 2023-08-10 NOTE — Patient Instructions (Signed)

## 2023-08-16 ENCOUNTER — Other Ambulatory Visit: Payer: Self-pay

## 2023-08-16 DIAGNOSIS — N1831 Chronic kidney disease, stage 3a: Secondary | ICD-10-CM

## 2023-08-17 ENCOUNTER — Inpatient Hospital Stay: Attending: Hematology | Admitting: Physician Assistant

## 2023-08-17 ENCOUNTER — Other Ambulatory Visit: Payer: Self-pay | Admitting: Physician Assistant

## 2023-08-17 ENCOUNTER — Inpatient Hospital Stay

## 2023-08-17 VITALS — BP 133/41 | HR 73 | Temp 98.2°F | Resp 20 | Wt 148.8 lb

## 2023-08-17 DIAGNOSIS — D631 Anemia in chronic kidney disease: Secondary | ICD-10-CM

## 2023-08-17 DIAGNOSIS — Z79899 Other long term (current) drug therapy: Secondary | ICD-10-CM | POA: Diagnosis not present

## 2023-08-17 DIAGNOSIS — M898X9 Other specified disorders of bone, unspecified site: Secondary | ICD-10-CM

## 2023-08-17 DIAGNOSIS — N1832 Chronic kidney disease, stage 3b: Secondary | ICD-10-CM | POA: Diagnosis present

## 2023-08-17 DIAGNOSIS — T50905A Adverse effect of unspecified drugs, medicaments and biological substances, initial encounter: Secondary | ICD-10-CM | POA: Diagnosis not present

## 2023-08-17 DIAGNOSIS — N1831 Chronic kidney disease, stage 3a: Secondary | ICD-10-CM | POA: Diagnosis not present

## 2023-08-17 DIAGNOSIS — R112 Nausea with vomiting, unspecified: Secondary | ICD-10-CM

## 2023-08-17 DIAGNOSIS — D509 Iron deficiency anemia, unspecified: Secondary | ICD-10-CM

## 2023-08-17 DIAGNOSIS — D61818 Other pancytopenia: Secondary | ICD-10-CM

## 2023-08-17 LAB — SAMPLE TO BLOOD BANK

## 2023-08-17 LAB — CBC
HCT: 30.2 % — ABNORMAL LOW (ref 36.0–46.0)
Hemoglobin: 9.2 g/dL — ABNORMAL LOW (ref 12.0–15.0)
MCH: 32.1 pg (ref 26.0–34.0)
MCHC: 30.5 g/dL (ref 30.0–36.0)
MCV: 105.2 fL — ABNORMAL HIGH (ref 80.0–100.0)
Platelets: 44 10*3/uL — ABNORMAL LOW (ref 150–400)
RBC: 2.87 MIL/uL — ABNORMAL LOW (ref 3.87–5.11)
RDW: 15.1 % (ref 11.5–15.5)
WBC: 1.9 10*3/uL — ABNORMAL LOW (ref 4.0–10.5)
nRBC: 0 % (ref 0.0–0.2)

## 2023-08-17 LAB — MAGNESIUM: Magnesium: 1.7 mg/dL (ref 1.7–2.4)

## 2023-08-17 MED ORDER — TRAMADOL HCL 25 MG PO TABS: 25.0000 mg | ORAL_TABLET | Freq: Two times a day (BID) | ORAL | 0 refills | Status: DC | PRN

## 2023-08-17 MED ORDER — EPOETIN ALFA-EPBX 10000 UNIT/ML IJ SOLN
10000.0000 [IU] | Freq: Once | INTRAMUSCULAR | Status: AC
  Administered 2023-08-17: 10000 [IU] via SUBCUTANEOUS
  Filled 2023-08-17: qty 1

## 2023-08-17 MED ORDER — ONDANSETRON HCL 4 MG PO TABS: 4.0000 mg | ORAL_TABLET | Freq: Three times a day (TID) | ORAL | 0 refills | Status: DC | PRN

## 2023-08-17 NOTE — Progress Notes (Signed)
Patient presents today for Retacrit injection. Hemoglobin reviewed prior to administration. VSS tolerated without incident or complaint. See MAR for details. Patient stable during and after injection. Patient discharged in satisfactory condition with no s/s of distress noted.  

## 2023-08-17 NOTE — Progress Notes (Addendum)
 [facility] [address], [place] [address]   CLINIC:  Medical Oncology/Hematology  PCP:  Grooms, Charmaine RIGGERS [address] [place] [address] [phone]   REASON FOR VISIT:  Follow-up for pancytopenia  PRIOR THERAPY: Blood transfusions  CURRENT THERAPY: Intermittent IV iron + Retacrit   INTERVAL HISTORY:   Jeanette Chang 83 y.o. 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  She was last seen by Pleasant Barefoot PA-C on 05/11/2023.  No interim hospitalizations or surgeries since last visit. After last visit, she received Feraheme  x 2 in April 2025. Labs from 08/03/2023 showed downtrending hemoglobin and IV iron, therefore was scheduled for IV Feraheme  on 08/10/2023.  At today's visit, patient is chiefly concerned with her severe fatigue and worsening bone pain: FATIGUE: Progressive and severe fatigue for the past month, seems to be somewhat worse after IV iron.  When discussing with patient and daughter about possible causes of worsening fatigue, they note labile blood sugars with significant hypoglycemia in the morning and hyperglycemia in the afternoons.  Patient reports adequate hydration, but kidney function noted to be worse. BONE PAIN: Described as cramping pain in her long bones, which is worsened for several days following her Retacrit  injections.  Little to no relief from Tylenol .  Muscle spasms have improved after starting magnesium .  Apart from bone pain, no other obvious side effects from Retacrit .  BP within target range.  Chronic intermittent leg swelling from fluid overload, left more than right.  No recurrent ice pica. She has some baseline shortness of breath on exertion She has some headaches, vertigo, and lightheadedness with standing.  No recent chest pain or syncopal episodes.   She has not had any recent infections, B symptoms, or lymphadenopathy.  Reports moderate  hemorrhoid bleeding about a month ago. Episode of epistaxis about 2 months ago, lasted about 15-20 minutes, dripping blood. No melena or hematemesis.   She continues to bruise easily in her extremities, as well as ongoing abdominal bruising at the locations of her insulin  injections.   She has little to no energy and 100% appetite. She endorses that she is maintaining a stable weight.  ASSESSMENT & PLAN:  1.   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  Normal duodenum. - Colonoscopy (06/29/2019): Perianal skin tags found on perianal exam.  2 small polyps at the splenic flexure.  External hemorrhoids. - BMBX (09/09/2020): Normocellular marrow (20%) with TLH, including minimal dyserythropoiesis and 1% blasts by manual aspirate differential.  Mild dyserythropoiesis which does not fulfill criteria for MDS.  Karyotype 45, X, -X[4]/46, XX[16], loss of one X chromosome is rare finding in the bone marrow is of older woman and when present as a minor clone (less than 50% of 20 metaphases) is thought to be most likely aging effect. - Labs from December 2023 showed normal B12, folate, copper , SPEP.  CKD stage II/IIIa based on creatinine (CKD stage IIIb based on elevated Cystatin C 1.78) - History of PRBC transfusions x 2 in October 2023 - Most recent IV iron with Feraheme  on 08/10/2023, but with increased fatigue following iron infusion. - Started on Retacrit  08/11/2022 - current dose 10,000 units every 2 weeks, but reports significant bone pain after Retacrit  - Most recent labs (08/03/2023): Hgb 9.8/MCV 103.3. Ferritin 55, iron saturation 16% Creatinine 1.28/GFR 41  - Intermittent hematochezia  thought to be from bleeding hemorrhoids.  No melena.  Occasional scant epistaxis. - PLAN: Received first dose of Feraheme  on 08/10/2023, but experienced some increased fatigue after  this.  Recommend additional dose of IV Feraheme  x 1 to be given in 1 month - Continue CBC/BB sample + Retacrit  10,000 units every 2 weeks in the setting of anemia of CKD as well as dyserythropoiesis noted on BMBX from July 2022.  Hgb trending downward, may need to increase Retacrit  dose in the near future. - BONE PAIN: Rx to pharmacy for tramadol  25 mg every 8 hours as needed for bone pain.  Max dose 2 tramadol  daily.  (Cautious use of tramadol  due to liver disease.) Rx to pharmacy for Zofran , as she is previously experienced nausea and vomiting with tramadol , but tolerated other opioids well with addition of antiemetic. - FATIGUE: Likely multifactorial in the setting of worsening CKD, liver cirrhosis, labile blood sugars, and side effects from IV iron. Increased creatinine noted, although kidney function is slightly worse than reflected in creatinine.  Cystatin C would be more accurate in the setting of liver cirrhosis.  Referral to Dr. Rachele for worsening CKD in the setting of liver cirrhosis. We will try to space out iron infusions as much as possible to help her tolerate them better. - Recheck CBC/D, CMP, and iron panel along with additional anemia panel as below, with RTC in 3 months - If any severe derangements beyond baseline cytopenias, would consider repeat bone marrow biopsy, as her BMBx in 2022 showed mild dyserythropoiesis, and there is moderate suspicion that she has developing MDS in addition to cytopenias related to CKD, cirrhosis, and splenomegaly.  2.  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  Normal duodenum. - Colonoscopy (06/29/2019): Perianal skin tags found on perianal exam.  2 small polyps at the splenic flexure.  External hemorrhoids. - BMBX (09/09/2020): Normocellular marrow (20%) with TLH, including minimal dyserythropoiesis and 1% blasts by manual  aspirate differential.  Mild dyserythropoiesis which does not fulfill criteria for MDS.  Karyotype 45, X, -X[4]/46, XX[16], loss of 1 X chromosome is rare finding in the bone marrow is of older woman and when present as a minor clone (less than 50% of 20 metaphases) is thought to be most likely aging effect. - CT [facility] (04/24/2021): Spleen enlarged measuring 14.1 cm with volume 632 mL.  Liver has nodular contour consistent with cirrhosis. - Labs from December 2023 showed normal B12/MMA, folate, copper , SPEP.  CKD stage IIIa/b - No B symptoms or recent infections. She has easy bruising.  Reports epistaxis x1 this month - Most recent labs (08/17/23): WBC 1.9, platelets 44 - PLAN: We will continue close surveillance.  Blood counts overall at baseline. - If any severe derangements beyond baseline cytopenias, would consider repeat bone marrow biopsy, as her BMBx in 2022 showed mild dyserythropoiesis, and there is moderate suspicion that she has developing MDS in addition to cytopenias related to CKD, cirrhosis, and splenomegaly.  3.  Hypomagnesemia - Hypomagnesemia 1.6 (06/22/2023) - Taking magnesium  400 mg daily  - Labs today (08/17/2023): Magnesium  1.7 - PLAN: Continue magnesium  oxide 400 mg daily  4.  Easy bruising - Per patient report, PCP had concerns regarding her easy bruising - Additional labs were checked on 08/04/2022, which showed normal fibrinogen , normal APTT, and normal INR.  Von Willebrand panel was not consistent with diagnosis of von Willebrand's disease. 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  5.  Social/family history: - Other PMH includes NASH cirrhosis, diabetes, arthritis, carotid artery disease her daughter lives with her at home.  She is independent of ADLs and IADLs.  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  Quit smoking in November 1992. - Maternal grandmother had low platelets.  Sister had low platelet. - Mother had  colon cancer.  Sister had stomach cancer.  Paternal uncle had lung cancer.   PLAN SUMMARY:  >> IV Feraheme  x 1, to be given a month from now >> CBC/BB sample + Retacrit  every 2 weeks  >> Please enter REFERRAL TO DR. RACHELE (Reason for referral = Anemia of CKD in the setting of liver cirrhosis) >> Labs in 3 months = CBC/D, CMP, ferritin, iron/TIBC, B12/MMA, folate, copper , SPEP, immunofixation, light chains, LDH, magnesium  >> OFFICE visit in 3 months (2 weeks after labs)     REVIEW OF SYSTEMS:  Review of Systems  Constitutional:  Positive for fatigue. Negative for appetite change, chills, diaphoresis, fever and unexpected weight change.  HENT:   Positive for nosebleeds. Negative for lump/mass.   Eyes:  Negative for eye problems.  Respiratory:  Positive for shortness of breath (with exertion). Negative for cough and hemoptysis.   Cardiovascular:  Positive for leg swelling. Negative for chest pain (at times, none today) and palpitations.  Gastrointestinal:  Positive for diarrhea and nausea. Negative for abdominal pain, blood in stool, constipation and vomiting.  Genitourinary:  Negative for hematuria.   Musculoskeletal:  Positive for arthralgias.  Skin:  Negative for itching.  Neurological:  Positive for dizziness (vertigo), headaches and numbness. Negative for light-headedness.  Hematological:  Bruises/bleeds easily.  Psychiatric/Behavioral:  Positive for sleep disturbance. Negative for depression. The patient is not nervous/anxious.      PHYSICAL EXAM:  ECOG PERFORMANCE STATUS: 1 - Symptomatic but completely ambulatory  Vitals:   08/17/23 1327 08/17/23 1332  BP: (!) 156/49 (!) 133/41  Pulse: 73   Resp: 20   Temp: 98.2 F (36.8 C)   SpO2: 100%    Filed Weights   08/17/23 1327  Weight: 148 lb 13 oz (67.5 kg)   Physical Exam Constitutional:      Appearance: Normal appearance. She is normal weight.  Cardiovascular:     Heart sounds: Normal heart sounds.  Pulmonary:      Breath sounds: Normal breath sounds.  Skin:    Findings: Bruising present.  Neurological:     General: No focal deficit present.     Mental Status: Mental status is at baseline.  Psychiatric:        Behavior: Behavior normal. Behavior is cooperative.    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    Osteoarthrosis, unspecified whether generalized or localized, unspecified site    Other dyspnea and respiratory abnormality    Other psoriasis    Other specified disorders of rotator cuff  syndrome of shoulder and allied disorders    Pain in joint    Pure hypercholesterolemia    RUQ pain    Spasm of back muscles    Thrombocytopenia, unspecified (HCC)    Type II or unspecified type diabetes mellitus without mention of complication, not stated as uncontrolled    Unspecified essential hypertension    Unspecified sinusitis (chronic)    Urinary incontinence    Past Surgical History:  Procedure Laterality Date   APPENDECTOMY  1964   BIOPSY  03/02/2017   Procedure: BIOPSY;  Surgeon: Golda Claudis PENNER, MD;  [facility];  Service: Endoscopy;;  antral   BIOPSY  06/17/2020   Procedure: BIOPSY;  Surgeon: Eartha Angelia Sieving, MD;  [facility];  Service: Gastroenterology;;   CAROTID ENDARTERECTOMY  2005   Left CEA   CAROTID ENDARTERECTOMY  2009   Right CEA   CATARACT EXTRACTION, BILATERAL Bilateral    CHOLECYSTECTOMY  1982   Gall Bladder   COLONOSCOPY  09   COLONOSCOPY N/A 06/27/2019   Procedure:  COLONOSCOPY;  Surgeon: Golda Claudis PENNER, MD;  [facility];  Service: Endoscopy;  Laterality: N/A;   ESOPHAGOGASTRODUODENOSCOPY N/A 03/02/2017   Procedure: ESOPHAGOGASTRODUODENOSCOPY (EGD);  Surgeon: Golda Claudis PENNER, MD;  [facility];  Service: Endoscopy;  Laterality: N/A;  12:55   ESOPHAGOGASTRODUODENOSCOPY N/A 06/27/2019   Procedure: ESOPHAGOGASTRODUODENOSCOPY (EGD);  Surgeon: Golda Claudis PENNER, MD;  [facility];  Service: Endoscopy;  Laterality: N/A;  730   ESOPHAGOGASTRODUODENOSCOPY (EGD) WITH PROPOFOL  N/A 06/17/2020   Procedure: ESOPHAGOGASTRODUODENOSCOPY (EGD) WITH PROPOFOL ;  Surgeon: Eartha Angelia Sieving, MD;  [facility];  Service: Gastroenterology;  Laterality: N/A;  AM   GIVENS CAPSULE STUDY N/A 10/24/2019   Procedure: GIVENS CAPSULE STUDY;  Surgeon: Golda Claudis PENNER, MD;  [facility];  Service: Endoscopy;  Laterality: N/A;  730   KNEE ARTHROSCOPY WITH MEDIAL MENISECTOMY Left 07/10/2019   Procedure: KNEE ARTHROSCOPY WITH MEDIAL MENISCECTOMY;  Surgeon: Margrette Taft BRAVO, MD;  Location: [facility];  Service: Orthopedics;  Laterality: Left;   KNEE ARTHROSCOPY WITH MEDIAL MENISECTOMY Left 03/14/2020   Procedure: KNEE ARTHROSCOPY WITH MEDIAL MENISCECTOMY;  Surgeon: Margrette Taft BRAVO, MD;  Location: [facility];  Service: Orthopedics;  Laterality: Left;   NOSE SURGERY  1978   PARATHYROIDECTOMY  1992   POLYPECTOMY  03/02/2017   Procedure: POLYPECTOMY;  Surgeon: Golda Claudis PENNER, MD;  [facility];  Service: Endoscopy;;  gastric    POLYPECTOMY  06/27/2019   Procedure: POLYPECTOMY;  Surgeon: Golda Claudis PENNER, MD;  [facility];  Service: Endoscopy;;  colon   Power port  03/06/2009   Right upper chest    TONSILLECTOMY     TUMOR EXCISION Right August 24, 2013   [facility] Dr. Adrien Blush, ENT   VAGINAL HYSTERECTOMY  [phone]    SOCIAL HISTORY:  Social History   Socioeconomic History   Marital status:  Widowed    Spouse name: Not on file   Number of children: Not on file   Years of education: Not on file   Highest education level: Not on file  Occupational History   Not on file  Tobacco Use   Smoking status: Former    Current packs/day: 0.00    Average packs/day: 1 pack/day for 42.0 years (42.0 ttl pk-yrs)    Types: Cigarettes    Start date: 03/12/1950    Quit date: 03/12/1992    Years  since quitting: 31.4    Passive exposure: Past   Smokeless tobacco: Former    Quit date: 12/16/1992  Vaping Use   Vaping status: Never Used  Substance and Sexual Activity   Alcohol  use: Yes    Alcohol /week: 1.0 standard drink of alcohol     Types: 1 Glasses of wine per week    Comment: very seldom   Drug use: No   Sexual activity: Not Currently  Other Topics Concern   Not on file  Social History Narrative   Not on file   Social Drivers of Health   Financial Resource Strain: Low Risk  (04/07/2021)   Received from [facility]   Overall Financial Resource Strain (CARDIA)    Difficulty of Paying Living Expenses: Not hard at all  Food Insecurity: Low Risk  (01/25/2023)   Received from [facility]   Hunger Vital Sign    Within the past 12 months, you worried that your food would run out before you got money to buy more: Never true    Within the past 12 months, the food you bought just didn't last and you didn't have money to get more. : Never true  Transportation Needs: No Transportation Needs (01/25/2023)   Received from [facility]    In the past 12 months, has lack of reliable transportation kept you from medical appointments, meetings, work or from getting things needed for daily living? : No  Physical Activity: Inactive (04/07/2021)   Received from [facility]   Exercise Vital Sign    On average, how many days per week do you engage in moderate to strenuous exercise (like a brisk walk)?: 0 days    On average, how many minutes do you engage in exercise at this  level?: 0 min  Stress: No Stress Concern Present (04/07/2021)   Received from [facility] - Occupational Stress Questionnaire    Feeling of Stress : Not at all  Social Connections: Moderately Isolated (04/07/2021)   Received from [facility]   Social Connection and Isolation Panel    In a typical week, how many times do you talk on the phone with family, friends, or neighbors?: More than three times a week    How often do you get together with friends or relatives?: Twice a week    How often do you attend church or religious services?: Never    Do you belong to any clubs or organizations such as church groups, unions, fraternal or athletic groups, or school groups?: No    How often do you attend meetings of the clubs or organizations you belong to?: Never    Are you married, widowed, divorced, separated, never married, or living with a partner?: Married  Intimate Partner Violence: Not At Risk (04/07/2021)   [facility] from [facility]   Humiliation, Afraid, Rape, and Kick questionnaire    Within the last year, have you been afraid of your partner or ex-partner?: No    Within the last year, have you been humiliated or emotionally abused in other ways by your partner or ex-partner?: No    Within the last year, have you been kicked, hit, slapped, or otherwise physically hurt by your partner or ex-partner?: No    Within the last year, have you been raped or forced to have any kind of sexual activity by your partner or ex-partner?: No    FAMILY HISTORY:  Family History  Problem Relation Age of Onset   Heart attack Mother    Cancer Mother    Heart attack Father    Cancer Sister     CURRENT MEDICATIONS:  Outpatient Encounter Medications as of 08/17/2023  Medication Sig Note   acetaminophen  (TYLENOL ) 500 MG tablet Take 1 tablet (500 mg total) by mouth every 6 (six) hours as needed for moderate pain or headache.    B-D UF III MINI PEN NEEDLES  31G X 5 MM MISC SMARTSIG:1 Pen Needle SUB-Q Daily    Biotin 10 MG CAPS Take by mouth daily at 6 (six) AM.    carvedilol  (COREG ) 3.125 MG tablet Take 3.125 mg by mouth 2 (two) times daily.    Continuous Glucose Sensor (FREESTYLE LIBRE 3 PLUS SENSOR) MISC Change sensor every 15 days.    dicyclomine  (BENTYL ) 10 MG capsule TAKE 1 CAPSULE (10 MG TOTAL) BY MOUTH 3 (THREE) TIMES DAILY BEFORE MEALS.    fluticasone (FLONASE) 50 MCG/ACT nasal spray Place into both nostrils.    furosemide (LASIX) 20 MG tablet Take 20 mg by mouth as needed. 05/12/2023: One half every other day and alternates with one whole tablet.    gabapentin (NEURONTIN) 300 MG capsule Take 300 mg by mouth at bedtime.    insulin  aspart (NOVOLOG ) 100 UNIT/ML injection Inject 5 Units into the skin 3 (three) times daily before meals. Inject 5 Units into the skin with breakfast, with lunch, and with evening meal as needed. Sliding scale- 150-200= 3 units, 201-250= 5 units, 251-300= 8 units    Insulin  Glargine (BASAGLAR KWIKPEN) 100 UNIT/ML SMARTSIG:15-50 Unit(s) SUB-Q Daily    losartan  (COZAAR ) 50 MG tablet Take 1 tablet (50 mg total) by mouth daily.    magnesium  oxide (MAG-OX) 400 (240 Mg) MG tablet Take 1 tablet (400 mg total) by mouth daily.    metFORMIN (GLUCOPHAGE) 500 MG tablet Take 500 mg by mouth 2 (two) times daily with a meal.    Multiple Vitamins-Minerals (EYE VITAMINS PO) Take by mouth. Red-2 for eyes    ondansetron  (ZOFRAN ) 4 MG tablet Take 1 tablet (4 mg total) by mouth every 8 (eight) hours as needed for nausea or vomiting (Use as needed with your tramdol for nausea/vomiting).    ONETOUCH ULTRA test strip 1 (ONE) STRIP DAILY,DM2,E11.65    OVER THE COUNTER MEDICATION Compounded Hemorrhoid cream [facility]) Apply rectally up to four times per day. 04/26/2022: prn   traMADol  HCl 25 MG TABS Take 25 mg by mouth 2 (two) times daily as needed (Do not exceed 2 tablets in the same day.  Do not take less than 8 hours apart.).     Facility-Administered Encounter Medications as of 08/17/2023  Medication   sodium chloride  irrigation 0.9 %    ALLERGIES:  Allergies  Allergen Reactions   Penicillins Other (See Comments), Rash, Swelling and Anaphylaxis    Has patient had a PCN reaction causing immediate rash, facial/tongue/throat swelling, SOB or lightheadedness with hypotension: No  Has patient had a PCN reaction causing severe rash involving mucus membranes or skin necrosis: No  Has patient had a PCN reaction that required hospitalization: Yes  Has patient had a PCN reaction occurring within the last 10 years: No  If all of the above answers are NO, then may proceed with Cephalosporin use.  Other Reaction(s): Other (See Comments)  Has patient had a PCN reaction causing immediate rash, facial/tongue/throat swelling, SOB or lightheadedness with hypotension: No Has patient had a PCN reaction causing severe rash involving mucus  membranes or skin necrosis: No Has patient had a PCN reaction that required hospitalization: Yes Has patient had a PCN reaction occurring within the last 10 years: No If all of the above answers are NO, then may proceed with Cephalosporin use.   Codeine  Nausea And Vomiting and Rash   [place] Purpureus Went Yeast Other (See Comments)    Headaches, myalgias  Other Reaction(s): Other (See Comments)  Headaches, myalgias Headaches, myalgias Headaches, myalgias    Headaches, myalgias   Niacin And Related Other (See Comments)    Headaches, myalgias   Red Yeast Rice Other (See Comments)    Headaches, myalgias   Actos [Pioglitazone Hydrochloride] Other (See Comments)    Severe headache   Amaryl Nausea Only and Other (See Comments)    Severe headache (patient is tolerating 2 mg dosing)   Iron Diarrhea   Sanctura [Trospium Chloride] Nausea Only and Other (See Comments)    Severe headache   Statins Other (See Comments)    Severe headache,  Hips and leg weakness that cause patient not to be  able to function as per patient.   Toviaz [Fesoterodine Fumarate] Other (See Comments)    Cramps, severe headache   Norco [Hydrocodone -Acetaminophen ] Nausea And Vomiting and Anxiety   Ultram  [Tramadol  Hcl] Nausea And Vomiting    LABORATORY DATA:  I have reviewed the labs as listed.  CBC    Component Value Date/Time   WBC 1.9 (L) 08/17/2023 1310   RBC 2.87 (L) 08/17/2023 1310   HGB 9.2 (L) 08/17/2023 1310   HCT 30.2 (L) 08/17/2023 1310   PLT 44 (L) 08/17/2023 1310   MCV 105.2 (H) 08/17/2023 1310   MCH 32.1 08/17/2023 1310   MCHC 30.5 08/17/2023 1310   RDW 15.1 08/17/2023 1310   LYMPHSABS 1.0 08/03/2023 1333   MONOABS 0.2 08/03/2023 1333   EOSABS 0.1 08/03/2023 1333   BASOSABS 0.0 08/03/2023 1333      Latest Ref Rng & Units 08/03/2023    1:33 PM 06/22/2023   10:00 AM 05/12/2023    1:45 PM  CMP  Glucose 70 - 99 mg/dL 800  705  737   BUN 8 - 23 mg/dL 40  27  29   Creatinine 0.44 - 1.00 mg/dL 8.71  9.01  9.12   Sodium 135 - 145 mmol/L 139  139  141   Potassium 3.5 - 5.1 mmol/L 4.4  4.4  4.3   Chloride 98 - 111 mmol/L 106  103  106   CO2 22 - 32 mmol/L 20  26  21    Calcium 8.9 - 10.3 mg/dL 9.2  9.1  9.9   Total Protein 6.5 - 8.1 g/dL 6.5  6.6  6.9   Total Bilirubin 0.0 - 1.2 mg/dL 0.5  0.8  0.5   Alkaline Phos 38 - 126 U/L 72  96    AST 15 - 41 U/L 32  40  22   ALT 0 - 44 U/L 25  60  21     DIAGNOSTIC IMAGING:  I have independently reviewed the relevant imaging and discussed with the patient.   WRAP UP:  All questions were answered. The patient knows to call the clinic with any problems, questions or concerns.  Medical decision making: High (multiple patient complaints, extensive history and education provided to patient/family)  Time spent on visit: I spent 45 minutes counseling the patient face to face. The total time spent in the appointment was 85 minutes and more than 50% was on counseling.  Pleasant CHRISTELLA Barefoot, PA-C  08/17/23 10:41 PM

## 2023-08-17 NOTE — Patient Instructions (Signed)
 [facility] [place] - A DEPT OF [facility]  Discharge Instructions: Thank you for choosing [facility] to provide your oncology and hematology care.  If you have a lab appointment with the Cancer Center - please note that after April 8th, 2024, all labs will be drawn in the cancer center.  You do not have to check in or register with the main entrance as you have in the past but will complete your check-in in the cancer center.  Wear comfortable clothing and clothing appropriate for easy access to any Portacath or PICC line.   We strive to give you quality time with your provider. You may need to reschedule your appointment if you arrive late (15 or more minutes).  Arriving late affects you and other patients whose appointments are after yours.  Also, if you miss three or more appointments without notifying the office, you may be dismissed from the clinic at the provider's discretion.      For prescription refill requests, have your pharmacy contact our office and allow 72 hours for refills to be completed.    Today you received the following retacrit, return as scheduled.   To help prevent nausea and vomiting after your treatment, we encourage you to take your nausea medication as directed.  BELOW ARE SYMPTOMS THAT SHOULD BE REPORTED IMMEDIATELY: *FEVER GREATER THAN 100.4 F (38 C) OR HIGHER *CHILLS OR SWEATING *NAUSEA AND VOMITING THAT IS NOT CONTROLLED WITH YOUR NAUSEA MEDICATION *UNUSUAL SHORTNESS OF BREATH *UNUSUAL BRUISING OR BLEEDING *URINARY PROBLEMS (pain or burning when urinating, or frequent urination) *BOWEL PROBLEMS (unusual diarrhea, constipation, pain near the anus) TENDERNESS IN MOUTH AND THROAT WITH OR WITHOUT PRESENCE OF ULCERS (sore throat, sores in mouth, or a toothache) UNUSUAL RASH, SWELLING OR PAIN  UNUSUAL VAGINAL DISCHARGE OR ITCHING   Items with * indicate a potential emergency and should be followed up as soon as possible or  go to the Emergency Department if any problems should occur.  Please show the CHEMOTHERAPY ALERT CARD or IMMUNOTHERAPY ALERT CARD at check-in to the Emergency Department and triage nurse.  Should you have questions after your visit or need to cancel or reschedule your appointment, please contact [facility] [place] - A DEPT OF [facility] [phone]  and follow the prompts.  Office hours are 8:00 a.m. to 4:30 p.m. Monday - Friday. Please note that voicemails left after 4:00 p.m. may not be returned until the following business day.  We are closed weekends and major holidays. You have access to a nurse at all times for urgent questions. Please call the main number to the clinic [phone] and follow the prompts.  For any non-urgent questions, you may also contact your provider using MyChart. We now offer e-Visits for anyone 40 and older to request care online for non-urgent symptoms. For details visit [email or web address].   Also download the MyChart app! Go to the app store, search "MyChart", open the app, select [place], and log in with your MyChart username and password.

## 2023-08-17 NOTE — Patient Instructions (Addendum)
 [facility] at [facility] **VISIT SUMMARY & IMPORTANT INSTRUCTIONS **   You were seen today by Pleasant Barefoot PA-C for your follow-up visit.    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 You received 1 dose of IV iron last week.  We will schedule another dose of IV iron to be given in 1 month. Continue RETACRIT  (epoetin  alfa injection) every 2 weeks to help your body make more blood cells. We will continue to check your blood counts every 2 weeks on the same day that you receive the Retacrit  injection. **Your blood count has been trending downward.  We will continue you on the same dose of Retacrit  for now, but may need to increase it in the future, or consider repeat bone marrow biopsy.**  **BONE PAIN** Bone pain is a common side effect of Retacrit  injections. I will prescribe a low-grade narcotic called Tramadol  (25 mg) to be used for severe bone pain as needed.  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 Use this only as needed for severe bone pain following your Retacrit  injections. Do not take more than 2 tablets of tramadol  in the same day.  These doses should be separated by at least 8 hours. Pay close attention for any decreased mental status, increased fall risk, or other changes. Take this with Zofran  if you experience nausea and vomiting. See attached handout for important information regarding tramadol .  LOW PLATELETS & WHITE BLOOD CELLS Your low platelets and low white blood cells remain low, but are overall stable. They remain low due to your liver cirrhosis and enlarged spleen, although your last bone marrow biopsy did also show some mild abnormalities that we will keep an eye on.  FATIGUE: Your fatigue could be coming from several different factors, which are discussed below, as well as recommended next steps. IRON INFUSIONS: It is common to have increased fatigue  for 48 to 72 hours after iron infusions.  This symptom may last longer, since your liver cirrhosis it makes it harder to process iron. WHAT TO DO: We will give some additional space between your iron infusions to see if your body tolerates them better. LIVER CIRRHOSIS: Liver cirrhosis can cause fatigue.   WHAT TO DO:  Continue follow-up with your gastroenterologist. LABILE BLOOD SUGARS: When diabetes is hard to control with elevated blood sugars as well as low blood sugars, this could cause some fatigue. WHAT TO DO: Discussed with your primary care doctor and/your endocrinologist. WORSENING KIDNEY FUNCTION: You have had previous evidence of decreased kidney function based on elevated Cystatin C (better measure of kidney function in patients with liver cirrhosis).  However, you have also started to have some increased creatinine, which could show that your kidneys are getting worse.  This can cause increased fatigue and mental fog. WHAT TO DO: We will refer you to see kidney specialist (nephrologist) named Dr. Rachele.   LOW MAGNESIUM :  Continue Magnesium  400 mg daily.  FOLLOW-UP APPOINTMENT: Office visit in 3 months  ** Thank you for trusting me with your healthcare!  I strive to provide all of my patients with quality care at each visit.  If you receive a survey for this visit, I would be so grateful to you for taking the time to provide feedback.  Thank you in advance!  ~ Shandy Vi  Dr. Alean Stands   &   Pleasant Barefoot, PA-C   - - - - - - - - - - - - - - - - - -    Thank you for choosing  Cancer Center at [facility] to provide your oncology and hematology care.  To afford each patient quality time with our provider, please arrive at least 15 minutes before your scheduled appointment time.   If you have a lab appointment with the Cancer Center please come in thru the Main Entrance and check in at the main information desk.  You need to  re-schedule your appointment should you arrive 10 or more minutes late.  We strive to give you quality time with our providers, and arriving late affects you and other patients whose appointments are after yours.  Also, if you no show three or more times for appointments you may be dismissed from the clinic at the providers discretion.     Again, thank you for choosing [facility].  Our hope is that these requests will decrease the amount of time that you wait before being seen by our physicians.       _____________________________________________________________  Should you have questions after your visit to [facility], please contact our office at [phone] and follow the prompts.  Our office hours are 8:00 a.m. and 4:30 p.m. Monday - Friday.  Please note that voicemails left after 4:00 p.m. may not be returned until the following business day.  We are closed weekends and major holidays.  You do have access to a nurse 24-7, just call the main number to the clinic [phone] and do not press any options, hold on the line and a nurse will answer the phone.    For prescription refill requests, have your pharmacy contact our office and allow 72 hours.

## 2023-08-25 ENCOUNTER — Telehealth (INDEPENDENT_AMBULATORY_CARE_PROVIDER_SITE_OTHER): Payer: Self-pay

## 2023-08-25 ENCOUNTER — Other Ambulatory Visit (INDEPENDENT_AMBULATORY_CARE_PROVIDER_SITE_OTHER): Payer: Self-pay

## 2023-08-25 DIAGNOSIS — R1030 Lower abdominal pain, unspecified: Secondary | ICD-10-CM

## 2023-08-25 MED ORDER — DICYCLOMINE HCL 10 MG PO CAPS: 10.0000 mg | ORAL_CAPSULE | Freq: Three times a day (TID) | ORAL | 0 refills | Status: DC

## 2023-08-25 NOTE — Telephone Encounter (Signed)
 Patient daughter called to say patient needed refills. I have sent this in to [facility] [place]. Patient aware.

## 2023-08-25 NOTE — Telephone Encounter (Signed)
 Medication refilled

## 2023-08-29 ENCOUNTER — Other Ambulatory Visit: Payer: Self-pay | Admitting: *Deleted

## 2023-08-29 DIAGNOSIS — M898X9 Other specified disorders of bone, unspecified site: Secondary | ICD-10-CM

## 2023-08-29 MED ORDER — TRAMADOL HCL 50 MG PO TABS: 25.0000 mg | ORAL_TABLET | Freq: Two times a day (BID) | ORAL | 0 refills | Status: DC | PRN

## 2023-08-30 ENCOUNTER — Other Ambulatory Visit: Payer: Self-pay

## 2023-08-30 DIAGNOSIS — D631 Anemia in chronic kidney disease: Secondary | ICD-10-CM

## 2023-08-30 DIAGNOSIS — D509 Iron deficiency anemia, unspecified: Secondary | ICD-10-CM

## 2023-08-31 ENCOUNTER — Inpatient Hospital Stay

## 2023-08-31 VITALS — BP 128/55 | HR 71 | Resp 18

## 2023-08-31 DIAGNOSIS — D509 Iron deficiency anemia, unspecified: Secondary | ICD-10-CM

## 2023-08-31 DIAGNOSIS — N1832 Chronic kidney disease, stage 3b: Secondary | ICD-10-CM | POA: Diagnosis not present

## 2023-08-31 DIAGNOSIS — D631 Anemia in chronic kidney disease: Secondary | ICD-10-CM

## 2023-08-31 DIAGNOSIS — Z79899 Other long term (current) drug therapy: Secondary | ICD-10-CM | POA: Diagnosis not present

## 2023-08-31 LAB — CBC WITH DIFFERENTIAL/PLATELET
Abs Immature Granulocytes: 0.01 K/uL (ref 0.00–0.07)
Basophils Absolute: 0 K/uL (ref 0.0–0.1)
Basophils Relative: 0 %
Eosinophils Absolute: 0.1 K/uL (ref 0.0–0.5)
Eosinophils Relative: 3 %
HCT: 32.1 % — ABNORMAL LOW (ref 36.0–46.0)
Hemoglobin: 9.7 g/dL — ABNORMAL LOW (ref 12.0–15.0)
Immature Granulocytes: 0 %
Lymphocytes Relative: 34 %
Lymphs Abs: 0.9 K/uL (ref 0.7–4.0)
MCH: 32.1 pg (ref 26.0–34.0)
MCHC: 30.2 g/dL (ref 30.0–36.0)
MCV: 106.3 fL — ABNORMAL HIGH (ref 80.0–100.0)
Monocytes Absolute: 0.1 K/uL (ref 0.1–1.0)
Monocytes Relative: 5 %
Neutro Abs: 1.5 K/uL — ABNORMAL LOW (ref 1.7–7.7)
Neutrophils Relative %: 58 %
Platelets: 47 K/uL — ABNORMAL LOW (ref 150–400)
RBC: 3.02 MIL/uL — ABNORMAL LOW (ref 3.87–5.11)
RDW: 15.2 % (ref 11.5–15.5)
WBC: 2.7 K/uL — ABNORMAL LOW (ref 4.0–10.5)
nRBC: 0 % (ref 0.0–0.2)

## 2023-08-31 LAB — SAMPLE TO BLOOD BANK

## 2023-08-31 MED ORDER — EPOETIN ALFA-EPBX 10000 UNIT/ML IJ SOLN
10000.0000 [IU] | Freq: Once | INTRAMUSCULAR | Status: AC
  Administered 2023-08-31: 10000 [IU] via SUBCUTANEOUS
  Filled 2023-08-31: qty 1

## 2023-08-31 NOTE — Progress Notes (Signed)
 Called lab to see what pt hemoglobin was. 9.7 reported by lab tech.   Proceed with injection today per protocol.  Retacrit  injection given per orders. Patient tolerated it well without problems. Vitals stable and discharged home from clinic ambulatory. Follow up as scheduled.

## 2023-08-31 NOTE — Patient Instructions (Addendum)
 [facility] [place] [facility]  Discharge Instructions: Thank you for choosing [facility] to provide your oncology and hematology care.  If you have a lab appointment with the Cancer Center - please note that after April 8th, 2024, all labs will be drawn in the cancer center.  You do not have to check in or register with the main entrance as you have in the past but will complete your check-in in the cancer center.  Wear comfortable clothing and clothing appropriate for easy access to any Portacath or PICC line.   We strive to give you quality time with your provider. You may need to reschedule your appointment if you arrive late (15 or more minutes).  Arriving late affects you and other patients whose appointments are after yours.  Also, if you miss three or more appointments without notifying the office, you may be dismissed from the clinic at the provider's discretion.      For prescription refill requests, have your pharmacy contact our office and allow 72 hours for refills to be completed.    Today you received the following injection: retacrit   To help prevent nausea and vomiting after your treatment, we encourage you to take your nausea medication as directed.  BELOW ARE SYMPTOMS THAT SHOULD BE REPORTED IMMEDIATELY: *FEVER GREATER THAN 100.4 F (38 C) OR HIGHER *CHILLS OR SWEATING *NAUSEA AND VOMITING THAT IS NOT CONTROLLED WITH YOUR NAUSEA MEDICATION *UNUSUAL SHORTNESS OF BREATH *UNUSUAL BRUISING OR BLEEDING *URINARY PROBLEMS (pain or burning when urinating, or frequent urination) *BOWEL PROBLEMS (unusual diarrhea, constipation, pain near the anus) TENDERNESS IN MOUTH AND THROAT WITH OR WITHOUT PRESENCE OF ULCERS (sore throat, sores in mouth, or a toothache) UNUSUAL RASH, SWELLING OR PAIN  UNUSUAL VAGINAL DISCHARGE OR ITCHING   Items with * indicate a potential emergency and should be followed up as soon as possible or go to the  Emergency Department if any problems should occur.  Please show the CHEMOTHERAPY ALERT CARD or IMMUNOTHERAPY ALERT CARD at check-in to the Emergency Department and triage nurse.  Should you have questions after your visit or need to cancel or reschedule your appointment, please contact [facility] [place] - A DEPT OF Eligha Bridegroom [facility] [phone]  and follow the prompts.  Office hours are 8:00 a.m. to 4:30 p.m. Monday - Friday. Please note that voicemails left after 4:00 p.m. may not be returned until the following business day.  We are closed weekends and major holidays. You have access to a nurse at all times for urgent questions. Please call the main number to the clinic [phone] and follow the prompts.  For any non-urgent questions, you may also contact your provider using MyChart. We now offer e-Visits for anyone 29 and older to request care online for non-urgent symptoms. For details visit [email or web address].   Also download the MyChart app! Go to the app store, search "MyChart", open the app, select [place], and log in with your MyChart username and password.

## 2023-09-01 ENCOUNTER — Encounter: Payer: Self-pay | Admitting: Physician Assistant

## 2023-09-01 ENCOUNTER — Ambulatory Visit: Admitting: Physician Assistant

## 2023-09-01 VITALS — BP 115/68 | HR 68 | Temp 98.6°F | Ht 63.0 in | Wt 146.0 lb

## 2023-09-01 DIAGNOSIS — Z794 Long term (current) use of insulin: Secondary | ICD-10-CM

## 2023-09-01 DIAGNOSIS — I10 Essential (primary) hypertension: Secondary | ICD-10-CM | POA: Diagnosis not present

## 2023-09-01 DIAGNOSIS — E1165 Type 2 diabetes mellitus with hyperglycemia: Secondary | ICD-10-CM | POA: Diagnosis not present

## 2023-09-01 MED ORDER — GABAPENTIN 300 MG PO CAPS
300.0000 mg | ORAL_CAPSULE | Freq: Every day | ORAL | 2 refills | Status: DC
Start: 2023-09-01 — End: 2024-01-03

## 2023-09-01 MED ORDER — LOSARTAN POTASSIUM 25 MG PO TABS: 25.0000 mg | ORAL_TABLET | Freq: Every day | ORAL | 1 refills | Status: DC

## 2023-09-01 MED ORDER — METFORMIN HCL 500 MG PO TABS: 500.0000 mg | ORAL_TABLET | Freq: Two times a day (BID) | ORAL | 2 refills | Status: DC

## 2023-09-01 NOTE — Assessment & Plan Note (Signed)
 115/68 Controlled. Continue with reduced dose of Losartan . No change in management. Discussed DASH diet and dietary sodium restrictions.  Continue dietary efforts and physical activity.

## 2023-09-01 NOTE — Progress Notes (Signed)
 Established Patient Office Visit  Subjective   Patient ID: Jeanette Chang, female    DOB: 1940/09/14  Age: 83 y.o. MRN: [id number]  Chief Complaint  Patient presents with   Follow-up    3 month f/u     Patient presents today for follow up regarding hypertension and diabetes. Currently on losartan  for blood pressure control and states daily compliance with medications. Daughter reports decreased dose due to episodes of hypotension. Patient denies headaches, blurry vision, dizziness, chest pain, shortness of breath, or palpitations. Currently on metformin  and insulin  for diabetes and states daily compliance with medications. Daughter reports frequent episodes of hypoglycemia following meal time insulin  injections. Regularly checking blood glucose at home with CGM. Patient denies complaints of foot pain or paresthesias. Seen by ophthalmologist <1 yr ago. Following a low carb diet.  Daughter notes that on recent lab work with hematology, creatinine has been elevated and GFR has been decreased. Patient scheduled to see nephrology in August.    Review of Systems  Constitutional:  Negative for fever, malaise/fatigue and weight loss.  Eyes:  Negative for blurred vision and double vision.  Respiratory:  Negative for shortness of breath.   Cardiovascular:  Negative for chest pain and palpitations.  Musculoskeletal:  Positive for back pain and joint pain. Negative for falls.  Neurological:  Negative for dizziness, loss of consciousness and headaches.      Objective:     BP 115/68   Pulse 68   Temp 98.6 F (37 C)   Ht 5' 3 (1.6 m)   Wt 146 lb (66.2 kg)   SpO2 98%   BMI 25.86 kg/m    Physical Exam Constitutional:      Appearance: Normal appearance.  HENT:     Head: Normocephalic.     Mouth/Throat:     Mouth: Mucous membranes are moist.     Pharynx: Oropharynx is clear.  Eyes:     Extraocular Movements: Extraocular movements intact.     Conjunctiva/sclera: Conjunctivae  normal.  Cardiovascular:     Rate and Rhythm: Normal rate and regular rhythm.     Heart sounds: Normal heart sounds. No murmur heard. Pulmonary:     Effort: Pulmonary effort is normal.     Breath sounds: Normal breath sounds. No wheezing or rales.  Musculoskeletal:     Right foot: Normal range of motion.     Left foot: Normal range of motion.  Feet:     Right foot:     Protective Sensation: 10 sites tested.  10 sites sensed.     Skin integrity: No ulcer, skin breakdown, erythema or warmth.     Left foot:     Protective Sensation: 10 sites tested.  10 sites sensed.     Skin integrity: No ulcer, skin breakdown, erythema or warmth.  Skin:    General: Skin is warm and dry.  Neurological:     General: No focal deficit present.     Mental Status: She is alert and oriented to person, place, and time.  Psychiatric:        Mood and Affect: Mood normal.        Behavior: Behavior normal.    Diabetic Foot Exam - Simple   Simple Foot Form Diabetic Foot exam was performed with the following findings: Yes 09/01/2023 10:41 AM  Visual Inspection No deformities, no ulcerations, no other skin breakdown bilaterally: Yes Sensation Testing Intact to touch and monofilament testing bilaterally: Yes Pulse Check Posterior Tibialis and Dorsalis pulse intact  bilaterally: Yes Comments      No results found for any visits on 09/01/23.  The ASCVD Risk score (Arnett DK, et al., 2019) failed to calculate for the following reasons:   The 2019 ASCVD risk score is only valid for ages 27 to 3    Assessment & Plan:   Return in about 3 months (around 12/02/2023) for Diabetes.   Primary hypertension Assessment & Plan: 115/68 Controlled. Continue with reduced dose of Losartan . No change in management. Discussed DASH diet and dietary sodium restrictions.  Continue dietary efforts and physical activity.   Orders: -     Losartan  Potassium; Take 1 tablet (25 mg total) by mouth daily.  Dispense: 90  tablet; Refill: 1  Type 2 diabetes mellitus with hyperglycemia, with long-term current use of insulin  [facility]) Assessment & Plan: Doing well. Discontinue meal time Novolog  insulin  due to hypoglycemia. Continue metformin  500 mg bid. Continue basal insulin  15 units at bedtime. Use Novolog  insulin  on a sliding scale 3-5 units as needed for glucose readings above 250. Up to date on diabetic eye exam. Foot exam done today. Lab work at follow up in 3 months.   Orders: -     metFORMIN  HCl; Take 1 tablet (500 mg total) by mouth 2 (two) times daily with a meal.  Dispense: 180 tablet; Refill: 2 -     Gabapentin ; Take 1 capsule (300 mg total) by mouth at bedtime.  Dispense: 90 capsule; Refill: 2    Malike Foglio, PA-C

## 2023-09-01 NOTE — Assessment & Plan Note (Addendum)
 Doing well. Discontinue meal time Novolog  insulin  due to hypoglycemia. Continue metformin  500 mg bid. Continue basal insulin  15 units at bedtime. Use Novolog  insulin  on a sliding scale 3-5 units as needed for glucose readings above 250. Up to date on diabetic eye exam. Foot exam done today. Lab work at follow up in 3 months.

## 2023-09-13 ENCOUNTER — Other Ambulatory Visit: Payer: Self-pay

## 2023-09-13 DIAGNOSIS — D509 Iron deficiency anemia, unspecified: Secondary | ICD-10-CM

## 2023-09-14 ENCOUNTER — Inpatient Hospital Stay

## 2023-09-14 VITALS — BP 139/52 | HR 80 | Temp 98.0°F | Resp 18

## 2023-09-14 DIAGNOSIS — N1831 Chronic kidney disease, stage 3a: Secondary | ICD-10-CM

## 2023-09-14 DIAGNOSIS — N1832 Chronic kidney disease, stage 3b: Secondary | ICD-10-CM | POA: Diagnosis not present

## 2023-09-14 DIAGNOSIS — D509 Iron deficiency anemia, unspecified: Secondary | ICD-10-CM

## 2023-09-14 DIAGNOSIS — Z79899 Other long term (current) drug therapy: Secondary | ICD-10-CM | POA: Diagnosis not present

## 2023-09-14 DIAGNOSIS — D631 Anemia in chronic kidney disease: Secondary | ICD-10-CM | POA: Diagnosis not present

## 2023-09-14 LAB — CBC
HCT: 30.1 % — ABNORMAL LOW (ref 36.0–46.0)
Hemoglobin: 9.3 g/dL — ABNORMAL LOW (ref 12.0–15.0)
MCH: 32.6 pg (ref 26.0–34.0)
MCHC: 30.9 g/dL (ref 30.0–36.0)
MCV: 105.6 fL — ABNORMAL HIGH (ref 80.0–100.0)
Platelets: 43 K/uL — ABNORMAL LOW (ref 150–400)
RBC: 2.85 MIL/uL — ABNORMAL LOW (ref 3.87–5.11)
RDW: 14.7 % (ref 11.5–15.5)
WBC: 2.1 K/uL — ABNORMAL LOW (ref 4.0–10.5)
nRBC: 0 % (ref 0.0–0.2)

## 2023-09-14 LAB — SAMPLE TO BLOOD BANK

## 2023-09-14 MED ORDER — EPOETIN ALFA-EPBX 10000 UNIT/ML IJ SOLN
10000.0000 [IU] | Freq: Once | INTRAMUSCULAR | Status: AC
  Administered 2023-09-14: 10000 [IU] via SUBCUTANEOUS
  Filled 2023-09-14: qty 1

## 2023-09-14 NOTE — Progress Notes (Signed)
Patient presents today for Retacrit injection. Hemoglobin reviewed prior to administration. VSS tolerated without incident or complaint. See MAR for details. Patient stable during and after injection. Patient discharged in satisfactory condition with no s/s of distress noted.  

## 2023-09-14 NOTE — Patient Instructions (Signed)

## 2023-09-16 ENCOUNTER — Inpatient Hospital Stay: Attending: Hematology

## 2023-09-16 VITALS — BP 127/44 | HR 66 | Temp 96.7°F | Resp 18

## 2023-09-16 DIAGNOSIS — E611 Iron deficiency: Secondary | ICD-10-CM | POA: Insufficient documentation

## 2023-09-16 DIAGNOSIS — N1832 Chronic kidney disease, stage 3b: Secondary | ICD-10-CM | POA: Insufficient documentation

## 2023-09-16 DIAGNOSIS — N1831 Chronic kidney disease, stage 3a: Secondary | ICD-10-CM

## 2023-09-16 DIAGNOSIS — D631 Anemia in chronic kidney disease: Secondary | ICD-10-CM | POA: Insufficient documentation

## 2023-09-16 DIAGNOSIS — D509 Iron deficiency anemia, unspecified: Secondary | ICD-10-CM

## 2023-09-16 MED ORDER — SODIUM CHLORIDE 0.9 % IV SOLN
510.0000 mg | Freq: Once | INTRAVENOUS | Status: AC
  Administered 2023-09-16: 510 mg via INTRAVENOUS
  Filled 2023-09-16: qty 510

## 2023-09-16 MED ORDER — ACETAMINOPHEN 325 MG PO TABS: 650.0000 mg | ORAL_TABLET | Freq: Once | ORAL | Status: DC

## 2023-09-16 MED ORDER — CETIRIZINE HCL 10 MG PO TABS: 10.0000 mg | ORAL_TABLET | Freq: Once | ORAL | Status: DC

## 2023-09-16 MED ORDER — SODIUM CHLORIDE 0.9 % IV SOLN: INTRAVENOUS | Status: DC

## 2023-09-16 NOTE — Progress Notes (Signed)
 Patient presents today for iron infusion of Feraheme .  Patient is in satisfactory condition with no new complaints voiced.  Vital signs are stable. Patient reports taking tylenol  650mg  and Claritin  10mg  today at 9am prior to arrival  We will proceed with infusion per provider orders.    Patient tolerated treatment well with no complaints voiced.  Patient left ambulatory in stable condition.  Vital signs stable at discharge.  Follow up as scheduled.

## 2023-09-16 NOTE — Patient Instructions (Signed)
 [facility] [place] [facility]  Discharge Instructions: Thank you for choosing [facility] to provide your oncology and hematology care.  If you have a lab appointment with the Cancer Center - please note that after April 8th, 2024, all labs will be drawn in the cancer center.  You do not have to check in or register with the main entrance as you have in the past but will complete your check-in in the cancer center.  Wear comfortable clothing and clothing appropriate for easy access to any Portacath or PICC line.   We strive to give you quality time with your provider. You may need to reschedule your appointment if you arrive late (15 or more minutes).  Arriving late affects you and other patients whose appointments are after yours.  Also, if you miss three or more appointments without notifying the office, you may be dismissed from the clinic at the provider's discretion.      For prescription refill requests, have your pharmacy contact our office and allow 72 hours for refills to be completed.    Today you received the following:  Feraheme.  Ferumoxytol Injection What is this medication? FERUMOXYTOL (FER ue MOX i tol) treats low levels of iron in your body (iron deficiency anemia). Iron is a mineral that plays an important role in making red blood cells, which carry oxygen from your lungs to the rest of your body. This medicine may be used for other purposes; ask your health care provider or pharmacist if you have questions. COMMON BRAND NAME(S): Feraheme What should I tell my care team before I take this medication? They need to know if you have any of these conditions: Anemia not caused by low iron levels High levels of iron in the blood Magnetic resonance imaging (MRI) test scheduled An unusual or allergic reaction to iron, other medications, foods, dyes, or preservatives Pregnant or trying to get pregnant Breastfeeding How should I  use this medication? This medication is injected into a vein. It is given by your care team in a hospital or clinic setting. Talk to your care team the use of this medication in children. Special care may be needed. Overdosage: If you think you have taken too much of this medicine contact a poison control center or emergency room at once. NOTE: This medicine is only for you. Do not share this medicine with others. What if I miss a dose? It is important not to miss your dose. Call your care team if you are unable to keep an appointment. What may interact with this medication? Other iron products This list may not describe all possible interactions. Give your health care provider a list of all the medicines, herbs, non-prescription drugs, or dietary supplements you use. Also tell them if you smoke, drink alcohol, or use illegal drugs. Some items may interact with your medicine. What should I watch for while using this medication? Visit your care team for regular checks on your progress. Tell your care team if your symptoms do not start to get better or if they get worse. You may need blood work done while you are taking this medication. You may need to eat more foods that contain iron. Talk to your care team. Foods that contain iron include whole grains or cereals, dried fruits, beans, peas, leafy green vegetables, and organ meats (liver, kidney). What side effects may I notice from receiving this medication? Side effects that you should  report to your care team as soon as possible: Allergic reactions--skin rash, itching, hives, swelling of the face, lips, tongue, or throat Low blood pressure--dizziness, feeling faint or lightheaded, blurry vision Shortness of breath Side effects that usually do not require medical attention (report to your care team if they continue or are bothersome): Flushing Headache Joint pain Muscle pain Nausea Pain, redness, or irritation at injection site This list  may not describe all possible side effects. Call your doctor for medical advice about side effects. You may report side effects to FDA at 1-800-FDA-1088. Where should I keep my medication? This medication is given in a hospital or clinic. It will not be stored at home. NOTE: This sheet is a summary. It may not cover all possible information. If you have questions about this medicine, talk to your doctor, pharmacist, or health care provider.  2024 Elsevier/Gold Standard (2022-09-22 00:00:00)    To help prevent nausea and vomiting after your treatment, we encourage you to take your nausea medication as directed.  BELOW ARE SYMPTOMS THAT SHOULD BE REPORTED IMMEDIATELY: *FEVER GREATER THAN 100.4 F (38 C) OR HIGHER *CHILLS OR SWEATING *NAUSEA AND VOMITING THAT IS NOT CONTROLLED WITH YOUR NAUSEA MEDICATION *UNUSUAL SHORTNESS OF BREATH *UNUSUAL BRUISING OR BLEEDING *URINARY PROBLEMS (pain or burning when urinating, or frequent urination) *BOWEL PROBLEMS (unusual diarrhea, constipation, pain near the anus) TENDERNESS IN MOUTH AND THROAT WITH OR WITHOUT PRESENCE OF ULCERS (sore throat, sores in mouth, or a toothache) UNUSUAL RASH, SWELLING OR PAIN  UNUSUAL VAGINAL DISCHARGE OR ITCHING   Items with * indicate a potential emergency and should be followed up as soon as possible or go to the Emergency Department if any problems should occur.  Please show the CHEMOTHERAPY ALERT CARD or IMMUNOTHERAPY ALERT CARD at check-in to the Emergency Department and triage nurse.  Should you have questions after your visit or need to cancel or reschedule your appointment, please contact [facility] [place] - A DEPT OF [facility]igh[facility] Bridegroom [facility] [phone]  and follow the prompts.  Office hours are 8:00 a.m. to 4:30 p.m. Monday - Friday. Please note that voicemails left after 4:00 p.m. may not be returned until the following business day.  We are closed weekends and major holidays. You have access to a  nurse at all times for urgent questions. Please call the main number to the clinic [phone] and follow the prompts.  For any non-urgent questions, you may also contact your provider using MyChart. We now offer e-Visits for anyone 39 and older to request care online for non-urgent symptoms. For details visit [email or web address].   Also download the MyChart app! Go to the app store, search "MyChart", open the app, select [place], and log in with your MyChart username and password.

## 2023-09-24 ENCOUNTER — Encounter (HOSPITAL_COMMUNITY): Payer: Self-pay

## 2023-09-24 ENCOUNTER — Emergency Department (HOSPITAL_COMMUNITY)
Admission: EM | Admit: 2023-09-24 | Discharge: 2023-09-24 | Disposition: A | Discharge status: Home / Self Care | Source: Ambulatory Visit | Attending: Emergency Medicine | Admitting: Emergency Medicine

## 2023-09-24 DIAGNOSIS — R600 Localized edema: Secondary | ICD-10-CM | POA: Insufficient documentation

## 2023-09-24 DIAGNOSIS — M7989 Other specified soft tissue disorders: Secondary | ICD-10-CM | POA: Diagnosis present

## 2023-09-24 DIAGNOSIS — Z794 Long term (current) use of insulin: Secondary | ICD-10-CM | POA: Insufficient documentation

## 2023-09-24 LAB — COMPREHENSIVE METABOLIC PANEL WITH GFR
ALT: 26 U/L (ref 0–44)
AST: 41 U/L (ref 15–41)
Albumin: 3.7 g/dL (ref 3.5–5.0)
Alkaline Phosphatase: 89 U/L (ref 38–126)
Anion gap: 10 (ref 5–15)
BUN: 21 mg/dL (ref 8–23)
CO2: 22 mmol/L (ref 22–32)
Calcium: 9.4 mg/dL (ref 8.9–10.3)
Chloride: 106 mmol/L (ref 98–111)
Creatinine, Ser: 0.99 mg/dL (ref 0.44–1.00)
GFR, Estimated: 56 mL/min — ABNORMAL LOW (ref >60)
Glucose, Bld: 147 mg/dL — ABNORMAL HIGH (ref 70–99)
Potassium: 5.8 mmol/L — ABNORMAL HIGH (ref 3.5–5.1)
Sodium: 138 mmol/L (ref 135–145)
Total Bilirubin: 0.9 mg/dL (ref 0.0–1.2)
Total Protein: 7.1 g/dL (ref 6.5–8.1)

## 2023-09-24 LAB — CBC WITH DIFFERENTIAL/PLATELET
Basophils Absolute: 0 K/uL (ref 0.0–0.1)
Basophils Relative: 0 %
Eosinophils Absolute: 0.1 K/uL (ref 0.0–0.5)
Eosinophils Relative: 5 %
HCT: 31.3 % — ABNORMAL LOW (ref 36.0–46.0)
Hemoglobin: 9.9 g/dL — ABNORMAL LOW (ref 12.0–15.0)
Lymphocytes Relative: 27 %
Lymphs Abs: 0.5 K/uL — ABNORMAL LOW (ref 0.7–4.0)
MCH: 33.3 pg (ref 26.0–34.0)
MCHC: 31.6 g/dL (ref 30.0–36.0)
MCV: 105.4 fL — ABNORMAL HIGH (ref 80.0–100.0)
Monocytes Absolute: 0.1 K/uL (ref 0.1–1.0)
Monocytes Relative: 5 %
Neutro Abs: 1.1 K/uL — ABNORMAL LOW (ref 1.7–7.7)
Neutrophils Relative %: 63 %
Platelets: 38 K/uL — ABNORMAL LOW (ref 150–400)
RBC: 2.97 MIL/uL — ABNORMAL LOW (ref 3.87–5.11)
RDW: 15.4 % (ref 11.5–15.5)
WBC: 1.8 K/uL — ABNORMAL LOW (ref 4.0–10.5)
nRBC: 0 % (ref 0.0–0.2)

## 2023-09-24 NOTE — Discharge Instructions (Signed)
 Do not take your potassium for 3 days.  Take an extra fluid pill or Lasix for 2 days.  Start tomorrow.  Follow-up with your doctor next week as planned

## 2023-09-24 NOTE — ED Triage Notes (Signed)
 Pt comes in for bilateral leg swelling. Pt states it has gotten worse in the past couple of days. Pt is currently on lasix. Pt woke up this morning and had trouble walking due to the swelling in her feet. Pt is A&Ox4. Pt's daughter did call her PCP and they told her to come her. PMS intact. Pt is does have +2 pitting edema. I feel like my feet are going to sleep.

## 2023-09-24 NOTE — ED Provider Notes (Signed)
 [place] EMERGENCY DEPARTMENT AT [facility] Provider Note   CSN: [id number] Arrival date & time: 09/24/23  1148     Patient presents with: Leg Swelling   Jeanette Chang is a 83 y.o. female.   Patient complains of mild weakness and additional swelling to her legs.  The history is provided by the patient and medical records. No language interpreter was used.  Weakness Severity:  Mild Onset quality:  Sudden Timing:  Constant Progression:  Waxing and waning Chronicity:  New Context: not alcohol  use   Relieved by:  Nothing Worsened by:  Nothing Associated symptoms: no abdominal pain, no chest pain, no cough, no diarrhea, no frequency, no headaches and no seizures        Prior to Admission medications   Medication Sig Start Date End Date Taking? Authorizing Provider  carvedilol  (COREG ) 3.125 MG tablet Take 3.125 mg by mouth 2 (two) times daily. 03/06/23  Yes [provider]  losartan  (COZAAR ) 25 MG tablet Take 1 tablet (25 mg total) by mouth daily. 09/01/23  Yes Grooms, Courtney, PA-C  acetaminophen  (TYLENOL ) 500 MG tablet Take 1 tablet (500 mg total) by mouth every 6 (six) hours as needed for moderate pain or headache. 07/10/19   Margrette Taft BRAVO, MD  B-D UF III MINI PEN NEEDLES 31G X 5 MM MISC SMARTSIG:1 Pen Needle SUB-Q Daily 04/26/22   [provider]  Biotin 10 MG CAPS Take by mouth daily at 6 (six) AM.    [provider]  Continuous Glucose Sensor (FREESTYLE LIBRE 3 PLUS SENSOR) MISC Change sensor every 15 days. 07/20/23   Grooms, Courtney, PA-C  dicyclomine  (BENTYL ) 10 MG capsule Take 1 capsule (10 mg total) by mouth 3 (three) times daily before meals. 08/25/23   Eartha Angelia Sieving, MD  fluticasone (FLONASE) 50 MCG/ACT nasal spray Place into both nostrils. 05/09/23   [provider]  furosemide (LASIX) 20 MG tablet Take 20 mg by mouth as needed. 10/30/19   [provider]  gabapentin  (NEURONTIN ) 300 MG capsule Take 1  capsule (300 mg total) by mouth at bedtime. 09/01/23   Grooms, Courtney, PA-C  insulin  aspart (NOVOLOG ) 100 UNIT/ML injection Inject 5 Units into the skin 3 (three) times daily before meals. Inject 5 Units into the skin with breakfast, with lunch, and with evening meal as needed. Sliding scale- 150-200= 3 units, 201-250= 5 units, 251-300= 8 units 06/03/23   Grooms, Victor, PA-C  Insulin  Glargine (BASAGLAR KWIKPEN) 100 UNIT/ML SMARTSIG:15-50 Unit(s) SUB-Q Daily 05/23/23   [provider]  magnesium  oxide (MAG-OX) 400 (240 Mg) MG tablet Take 1 tablet (400 mg total) by mouth daily. 06/22/23   Lamon Pleasant HERO, PA-C  metFORMIN  (GLUCOPHAGE ) 500 MG tablet Take 1 tablet (500 mg total) by mouth 2 (two) times daily with a meal. 09/01/23   Grooms, Courtney, PA-C  Multiple Vitamins-Minerals (EYE VITAMINS PO) Take by mouth. Red-2 for eyes    [provider]  ondansetron  (ZOFRAN ) 4 MG tablet Take 1 tablet (4 mg total) by mouth every 8 (eight) hours as needed for nausea or vomiting (Use as needed with your tramdol for nausea/vomiting). 08/17/23   Lamon Pleasant HERO, PA-C  ONETOUCH ULTRA test strip 1 (ONE) STRIP DAILY,DM2,E11.65 09/24/22   [provider]  OVER THE COUNTER MEDICATION Compounded Hemorrhoid cream [facility]) Apply rectally up to four times per day.    [provider]  traMADol  (ULTRAM ) 50 MG tablet Take 0.5 tablets (25 mg total) by mouth 2 (two) times  daily as needed (Take 8 hours apart). 08/29/23   Lamon Pleasant HERO, PA-C    Allergies: Penicillins, Codeine , Monascus purpureus went yeast, Niacin and related, Red yeast rice, Actos [pioglitazone hydrochloride], Amaryl, Iron, Sanctura [trospium chloride], Statins, Toviaz [fesoterodine fumarate], Norco [hydrocodone -acetaminophen ], and Ultram  [tramadol  hcl]    Review of Systems  Constitutional:  Negative for appetite change and fatigue.  HENT:  Negative for congestion, ear discharge and sinus pressure.    Eyes:  Negative for discharge.  Respiratory:  Negative for cough.   Cardiovascular:  Negative for chest pain.  Gastrointestinal:  Negative for abdominal pain and diarrhea.  Genitourinary:  Negative for frequency and hematuria.  Musculoskeletal:  Negative for back pain.  Skin:  Negative for rash.  Neurological:  Positive for weakness. Negative for seizures and headaches.  Psychiatric/Behavioral:  Negative for hallucinations.     Updated Vital Signs BP (!) 144/56   Pulse 68   Temp 98.9 F (37.2 C)   Resp 14   Ht 5' 3 (1.6 m)   Wt 66.7 kg   SpO2 93%   BMI 26.04 kg/m   Physical Exam Vitals and nursing note reviewed.  Constitutional:      Appearance: She is well-developed.  HENT:     Head: Normocephalic.     Nose: Nose normal.  Eyes:     General: No scleral icterus.    Conjunctiva/sclera: Conjunctivae normal.  Neck:     Thyroid : No thyromegaly.  Cardiovascular:     Rate and Rhythm: Normal rate and regular rhythm.     Heart sounds: No murmur heard.    No friction rub. No gallop.  Pulmonary:     Breath sounds: No stridor. No wheezing or rales.  Chest:     Chest wall: No tenderness.  Abdominal:     General: There is no distension.     Tenderness: There is no abdominal tenderness. There is no rebound.  Musculoskeletal:        General: Normal range of motion.     Cervical back: Neck supple.     Comments: Mild edema in legs  Lymphadenopathy:     Cervical: No cervical adenopathy.  Skin:    Findings: No erythema or rash.  Neurological:     Mental Status: She is alert and oriented to person, place, and time.     Motor: No abnormal muscle tone.     Coordination: Coordination normal.  Psychiatric:        Behavior: Behavior normal.     (all labs ordered are listed, but only abnormal results are displayed) Labs Reviewed  CBC WITH DIFFERENTIAL/PLATELET - Abnormal; Notable for the following components:      Result Value   WBC 1.8 (*)    RBC 2.97 (*)    Hemoglobin  9.9 (*)    HCT 31.3 (*)    MCV 105.4 (*)    Platelets 38 (*)    Neutro Abs 1.1 (*)    Lymphs Abs 0.5 (*)    All other components within normal limits  COMPREHENSIVE METABOLIC PANEL WITH GFR - Abnormal; Notable for the following components:   Potassium 5.8 (*)    Glucose, Bld 147 (*)    GFR, Estimated 56 (*)    All other components within normal limits    EKG: None  Radiology: No results found.   Procedures   Medications Ordered in the ED - No data to display  Medical Decision Making Amount and/or Complexity of Data Reviewed Labs: ordered.   Patient with peripheral edema and thrombocytopenia.  Patient is to follow-up with her oncologist this week and we will have her take an extra Lasix for 2 days.  She also hold off on her potassium because it is slightly high     Final diagnoses:  Peripheral edema    ED Discharge Orders     None          Suzette Pac, MD 09/24/23 1722

## 2023-09-27 ENCOUNTER — Other Ambulatory Visit: Payer: Self-pay

## 2023-09-27 DIAGNOSIS — D509 Iron deficiency anemia, unspecified: Secondary | ICD-10-CM

## 2023-09-28 ENCOUNTER — Inpatient Hospital Stay

## 2023-09-28 VITALS — BP 119/52 | HR 75 | Temp 97.9°F | Resp 18

## 2023-09-28 DIAGNOSIS — D509 Iron deficiency anemia, unspecified: Secondary | ICD-10-CM

## 2023-09-28 DIAGNOSIS — E611 Iron deficiency: Secondary | ICD-10-CM | POA: Diagnosis not present

## 2023-09-28 DIAGNOSIS — D631 Anemia in chronic kidney disease: Secondary | ICD-10-CM

## 2023-09-28 DIAGNOSIS — N1832 Chronic kidney disease, stage 3b: Secondary | ICD-10-CM | POA: Diagnosis not present

## 2023-09-28 LAB — CBC
HCT: 30.2 % — ABNORMAL LOW (ref 36.0–46.0)
Hemoglobin: 9.7 g/dL — ABNORMAL LOW (ref 12.0–15.0)
MCH: 34.3 pg — ABNORMAL HIGH (ref 26.0–34.0)
MCHC: 32.1 g/dL (ref 30.0–36.0)
MCV: 106.7 fL — ABNORMAL HIGH (ref 80.0–100.0)
Platelets: 42 K/uL — ABNORMAL LOW (ref 150–400)
RBC: 2.83 MIL/uL — ABNORMAL LOW (ref 3.87–5.11)
RDW: 15.1 % (ref 11.5–15.5)
WBC: 2 K/uL — ABNORMAL LOW (ref 4.0–10.5)
nRBC: 0 % (ref 0.0–0.2)

## 2023-09-28 LAB — SAMPLE TO BLOOD BANK

## 2023-09-28 MED ORDER — EPOETIN ALFA-EPBX 10000 UNIT/ML IJ SOLN
10000.0000 [IU] | Freq: Once | INTRAMUSCULAR | Status: AC
  Administered 2023-09-28 (×2): 10000 [IU] via SUBCUTANEOUS
  Filled 2023-09-28: qty 1

## 2023-09-28 NOTE — Patient Instructions (Signed)

## 2023-09-28 NOTE — Progress Notes (Signed)
 Patient's Hgb 9.7 and blood pressure stable. Patient tolerated Retacrit  injection with no complaints voiced.  Site clean and dry with no bruising or swelling noted at site.  See MAR for details.  Band aid applied.  Patient stable during and after injection.  Vss with discharge and left in satisfactory condition with no s/s of distress noted. All follow ups as scheduled.   Jeanette Chang

## 2023-10-02 ENCOUNTER — Other Ambulatory Visit: Payer: Self-pay | Admitting: Physician Assistant

## 2023-10-02 DIAGNOSIS — I10 Essential (primary) hypertension: Secondary | ICD-10-CM

## 2023-10-03 ENCOUNTER — Other Ambulatory Visit: Payer: Self-pay

## 2023-10-03 DIAGNOSIS — I10 Essential (primary) hypertension: Secondary | ICD-10-CM

## 2023-10-03 MED ORDER — LOSARTAN POTASSIUM 25 MG PO TABS: 25.0000 mg | ORAL_TABLET | Freq: Every day | ORAL | 1 refills | Status: DC

## 2023-10-08 DIAGNOSIS — D638 Anemia in other chronic diseases classified elsewhere: Secondary | ICD-10-CM | POA: Diagnosis not present

## 2023-10-08 DIAGNOSIS — E1122 Type 2 diabetes mellitus with diabetic chronic kidney disease: Secondary | ICD-10-CM | POA: Diagnosis not present

## 2023-10-08 DIAGNOSIS — N1831 Chronic kidney disease, stage 3a: Secondary | ICD-10-CM | POA: Diagnosis not present

## 2023-10-08 DIAGNOSIS — I129 Hypertensive chronic kidney disease with stage 1 through stage 4 chronic kidney disease, or unspecified chronic kidney disease: Secondary | ICD-10-CM | POA: Diagnosis not present

## 2023-10-10 ENCOUNTER — Other Ambulatory Visit (HOSPITAL_COMMUNITY): Payer: Self-pay | Admitting: Nephrology

## 2023-10-10 DIAGNOSIS — I129 Hypertensive chronic kidney disease with stage 1 through stage 4 chronic kidney disease, or unspecified chronic kidney disease: Secondary | ICD-10-CM

## 2023-10-10 DIAGNOSIS — D638 Anemia in other chronic diseases classified elsewhere: Secondary | ICD-10-CM

## 2023-10-10 DIAGNOSIS — N1831 Chronic kidney disease, stage 3a: Secondary | ICD-10-CM

## 2023-10-11 ENCOUNTER — Other Ambulatory Visit: Payer: Self-pay

## 2023-10-11 DIAGNOSIS — D509 Iron deficiency anemia, unspecified: Secondary | ICD-10-CM

## 2023-10-11 DIAGNOSIS — D631 Anemia in chronic kidney disease: Secondary | ICD-10-CM

## 2023-10-12 ENCOUNTER — Inpatient Hospital Stay

## 2023-10-12 VITALS — BP 156/51 | HR 63 | Temp 96.9°F | Resp 18

## 2023-10-12 DIAGNOSIS — N1831 Chronic kidney disease, stage 3a: Secondary | ICD-10-CM

## 2023-10-12 DIAGNOSIS — D509 Iron deficiency anemia, unspecified: Secondary | ICD-10-CM

## 2023-10-12 DIAGNOSIS — E611 Iron deficiency: Secondary | ICD-10-CM | POA: Diagnosis not present

## 2023-10-12 DIAGNOSIS — N1832 Chronic kidney disease, stage 3b: Secondary | ICD-10-CM | POA: Diagnosis not present

## 2023-10-12 DIAGNOSIS — D631 Anemia in chronic kidney disease: Secondary | ICD-10-CM | POA: Diagnosis not present

## 2023-10-12 LAB — CBC WITH DIFFERENTIAL/PLATELET
Abs Immature Granulocytes: 0 K/uL (ref 0.00–0.07)
Basophils Absolute: 0 K/uL (ref 0.0–0.1)
Basophils Relative: 0 %
Eosinophils Absolute: 0.1 K/uL (ref 0.0–0.5)
Eosinophils Relative: 4 %
HCT: 31.8 % — ABNORMAL LOW (ref 36.0–46.0)
Hemoglobin: 9.8 g/dL — ABNORMAL LOW (ref 12.0–15.0)
Immature Granulocytes: 0 %
Lymphocytes Relative: 29 %
Lymphs Abs: 0.7 K/uL (ref 0.7–4.0)
MCH: 32.7 pg (ref 26.0–34.0)
MCHC: 30.8 g/dL (ref 30.0–36.0)
MCV: 106 fL — ABNORMAL HIGH (ref 80.0–100.0)
Monocytes Absolute: 0.2 K/uL (ref 0.1–1.0)
Monocytes Relative: 7 %
Neutro Abs: 1.4 K/uL — ABNORMAL LOW (ref 1.7–7.7)
Neutrophils Relative %: 60 %
Platelets: 42 K/uL — ABNORMAL LOW (ref 150–400)
RBC: 3 MIL/uL — ABNORMAL LOW (ref 3.87–5.11)
RDW: 14.9 % (ref 11.5–15.5)
WBC: 2.3 K/uL — ABNORMAL LOW (ref 4.0–10.5)
nRBC: 0 % (ref 0.0–0.2)

## 2023-10-12 LAB — SAMPLE TO BLOOD BANK

## 2023-10-12 MED ORDER — EPOETIN ALFA-EPBX 10000 UNIT/ML IJ SOLN
10000.0000 [IU] | Freq: Once | INTRAMUSCULAR | Status: AC
  Administered 2023-10-12: 10000 [IU] via SUBCUTANEOUS
  Filled 2023-10-12: qty 1

## 2023-10-12 NOTE — Patient Instructions (Signed)

## 2023-10-12 NOTE — Progress Notes (Signed)
 Patient's Hgb 9.8 and blood pressure stable. Patient tolerated injection with no complaints voiced.  Site clean and dry with no bruising or swelling noted at site.  See MAR for details.  Band aid applied.  Patient stable during and after injection.  Vss with discharge and left in satisfactory condition with no s/s of distress noted. All follow ups as scheduled.   Soyla Bainter

## 2023-10-20 ENCOUNTER — Encounter (INDEPENDENT_AMBULATORY_CARE_PROVIDER_SITE_OTHER): Payer: Self-pay | Admitting: Gastroenterology

## 2023-10-25 ENCOUNTER — Other Ambulatory Visit: Payer: Self-pay

## 2023-10-25 DIAGNOSIS — D509 Iron deficiency anemia, unspecified: Secondary | ICD-10-CM

## 2023-10-26 ENCOUNTER — Inpatient Hospital Stay: Attending: Hematology

## 2023-10-26 ENCOUNTER — Inpatient Hospital Stay: Admitting: Oncology

## 2023-10-26 ENCOUNTER — Ambulatory Visit (HOSPITAL_COMMUNITY)
Admission: RE | Admit: 2023-10-26 | Discharge: 2023-10-26 | Disposition: A | Discharge status: Home / Self Care | Source: Ambulatory Visit | Attending: Nephrology | Admitting: Nephrology

## 2023-10-26 VITALS — BP 137/50 | HR 78 | Resp 16

## 2023-10-26 DIAGNOSIS — D638 Anemia in other chronic diseases classified elsewhere: Secondary | ICD-10-CM | POA: Insufficient documentation

## 2023-10-26 DIAGNOSIS — N1831 Anemia in chronic kidney disease: Secondary | ICD-10-CM

## 2023-10-26 DIAGNOSIS — D509 Iron deficiency anemia, unspecified: Secondary | ICD-10-CM

## 2023-10-26 DIAGNOSIS — I129 Hypertensive chronic kidney disease with stage 1 through stage 4 chronic kidney disease, or unspecified chronic kidney disease: Secondary | ICD-10-CM | POA: Insufficient documentation

## 2023-10-26 DIAGNOSIS — N1832 Chronic kidney disease, stage 3b: Secondary | ICD-10-CM | POA: Diagnosis not present

## 2023-10-26 DIAGNOSIS — D631 Anemia in chronic kidney disease: Secondary | ICD-10-CM | POA: Diagnosis not present

## 2023-10-26 DIAGNOSIS — N281 Cyst of kidney, acquired: Secondary | ICD-10-CM | POA: Diagnosis not present

## 2023-10-26 DIAGNOSIS — D61818 Other pancytopenia: Secondary | ICD-10-CM

## 2023-10-26 LAB — CBC WITH DIFFERENTIAL/PLATELET
Abs Immature Granulocytes: 0.01 K/uL (ref 0.00–0.07)
Basophils Absolute: 0 K/uL (ref 0.0–0.1)
Basophils Relative: 1 %
Eosinophils Absolute: 0.1 K/uL (ref 0.0–0.5)
Eosinophils Relative: 4 %
HCT: 31.9 % — ABNORMAL LOW (ref 36.0–46.0)
Hemoglobin: 10.2 g/dL — ABNORMAL LOW (ref 12.0–15.0)
Immature Granulocytes: 1 %
Lymphocytes Relative: 36 %
Lymphs Abs: 0.7 K/uL (ref 0.7–4.0)
MCH: 33.1 pg (ref 26.0–34.0)
MCHC: 32 g/dL (ref 30.0–36.0)
MCV: 103.6 fL — ABNORMAL HIGH (ref 80.0–100.0)
Monocytes Absolute: 0.1 K/uL (ref 0.1–1.0)
Monocytes Relative: 7 %
Neutro Abs: 1.1 K/uL — ABNORMAL LOW (ref 1.7–7.7)
Neutrophils Relative %: 51 %
Platelets: 39 K/uL — ABNORMAL LOW (ref 150–400)
RBC: 3.08 MIL/uL — ABNORMAL LOW (ref 3.87–5.11)
RDW: 14.4 % (ref 11.5–15.5)
WBC: 2 K/uL — ABNORMAL LOW (ref 4.0–10.5)
nRBC: 0 % (ref 0.0–0.2)

## 2023-10-26 LAB — SAMPLE TO BLOOD BANK

## 2023-10-26 MED ORDER — EPOETIN ALFA-EPBX 10000 UNIT/ML IJ SOLN
10000.0000 [IU] | Freq: Once | INTRAMUSCULAR | Status: AC
  Administered 2023-10-26: 10000 [IU] via SUBCUTANEOUS
  Filled 2023-10-26: qty 1

## 2023-10-26 NOTE — Progress Notes (Signed)
 Retacrit injection given per orders. Patient tolerated it well without problems. Vitals stable and discharged home from clinic ambulatory. Follow up as scheduled.

## 2023-10-26 NOTE — Patient Instructions (Signed)
 [facility] - A DEPT OF [facility]  Discharge Instructions: Thank you for choosing [facility] to provide your oncology and hematology care.  If you have a lab appointment with the Cancer Center - please note that after April 8th, 2024, all labs will be drawn in the cancer center.  You do not have to check in or register with the main entrance as you have in the past but will complete your check-in in the cancer center.  Wear comfortable clothing and clothing appropriate for easy access to any Portacath or PICC line.   We strive to give you quality time with your provider. You may need to reschedule your appointment if you arrive late (15 or more minutes).  Arriving late affects you and other patients whose appointments are after yours.  Also, if you miss three or more appointments without notifying the office, you may be dismissed from the clinic at the provider's discretion.      For prescription refill requests, have your pharmacy contact our office and allow 72 hours for refills to be completed.    Today you received the following injection Retacrit   To help prevent nausea and vomiting after your treatment, we encourage you to take your nausea medication as directed.  BELOW ARE SYMPTOMS THAT SHOULD BE REPORTED IMMEDIATELY: *FEVER GREATER THAN 100.4 F (38 C) OR HIGHER *CHILLS OR SWEATING *NAUSEA AND VOMITING THAT IS NOT CONTROLLED WITH YOUR NAUSEA MEDICATION *UNUSUAL SHORTNESS OF BREATH *UNUSUAL BRUISING OR BLEEDING *URINARY PROBLEMS (pain or burning when urinating, or frequent urination) *BOWEL PROBLEMS (unusual diarrhea, constipation, pain near the anus) TENDERNESS IN MOUTH AND THROAT WITH OR WITHOUT PRESENCE OF ULCERS (sore throat, sores in mouth, or a toothache) UNUSUAL RASH, SWELLING OR PAIN  UNUSUAL VAGINAL DISCHARGE OR ITCHING   Items with * indicate a potential emergency and should be followed up as soon as possible or go to the  Emergency Department if any problems should occur.  Please show the CHEMOTHERAPY ALERT CARD or IMMUNOTHERAPY ALERT CARD at check-in to the Emergency Department and triage nurse.  Should you have questions after your visit or need to cancel or reschedule your appointment, please contact [facility] [place] - A DEPT OF [facility] [phone]  and follow the prompts.  Office hours are 8:00 a.m. to 4:30 p.m. Monday - Friday. Please note that voicemails left after 4:00 p.m. may not be returned until the following business day.  We are closed weekends and major holidays. You have access to a nurse at all times for urgent questions. Please call the main number to the clinic [phone] and follow the prompts.  For any non-urgent questions, you may also contact your provider using MyChart. We now offer e-Visits for anyone 61 and older to request care online for non-urgent symptoms. For details visit [email or web address].   Also download the MyChart app! Go to the app store, search "MyChart", open the app, select [facility], and log in with your MyChart username and password.

## 2023-10-28 ENCOUNTER — Encounter (INDEPENDENT_AMBULATORY_CARE_PROVIDER_SITE_OTHER): Payer: Self-pay | Admitting: *Deleted

## 2023-10-31 ENCOUNTER — Telehealth (INDEPENDENT_AMBULATORY_CARE_PROVIDER_SITE_OTHER): Payer: Self-pay | Admitting: Gastroenterology

## 2023-10-31 DIAGNOSIS — K7460 Unspecified cirrhosis of liver: Secondary | ICD-10-CM

## 2023-10-31 NOTE — Telephone Encounter (Signed)
 Scheduled pt US  for 11/08/2023 at 10:30am. Called and spoke with patient and she is aware of the appointment.

## 2023-10-31 NOTE — Telephone Encounter (Signed)
 Pt received letter that it was time to schedule her 6 month U/S and 6 month follow up. [phone] (I will call her make make OV after her U/S)

## 2023-11-08 ENCOUNTER — Ambulatory Visit (HOSPITAL_COMMUNITY)
Admission: RE | Admit: 2023-11-08 | Discharge: 2023-11-08 | Disposition: A | Discharge status: Home / Self Care | Source: Ambulatory Visit | Attending: Gastroenterology | Admitting: Gastroenterology

## 2023-11-08 DIAGNOSIS — K7581 Nonalcoholic steatohepatitis (NASH): Secondary | ICD-10-CM | POA: Diagnosis not present

## 2023-11-08 DIAGNOSIS — K7460 Unspecified cirrhosis of liver: Secondary | ICD-10-CM | POA: Insufficient documentation

## 2023-11-08 DIAGNOSIS — Z9049 Acquired absence of other specified parts of digestive tract: Secondary | ICD-10-CM | POA: Diagnosis not present

## 2023-11-09 ENCOUNTER — Other Ambulatory Visit: Payer: Self-pay

## 2023-11-09 ENCOUNTER — Inpatient Hospital Stay

## 2023-11-09 ENCOUNTER — Ambulatory Visit (INDEPENDENT_AMBULATORY_CARE_PROVIDER_SITE_OTHER): Payer: Self-pay | Admitting: Gastroenterology

## 2023-11-09 VITALS — BP 148/46 | HR 71 | Temp 97.4°F | Resp 16

## 2023-11-09 DIAGNOSIS — D631 Anemia in chronic kidney disease: Secondary | ICD-10-CM

## 2023-11-09 DIAGNOSIS — D61818 Other pancytopenia: Secondary | ICD-10-CM

## 2023-11-09 DIAGNOSIS — N1832 Chronic kidney disease, stage 3b: Secondary | ICD-10-CM

## 2023-11-09 DIAGNOSIS — D509 Iron deficiency anemia, unspecified: Secondary | ICD-10-CM

## 2023-11-09 LAB — CBC WITH DIFFERENTIAL/PLATELET
Basophils Absolute: 0 K/uL (ref 0.0–0.1)
Basophils Relative: 0 %
Eosinophils Absolute: 0 K/uL (ref 0.0–0.5)
Eosinophils Relative: 2 %
HCT: 30.6 % — ABNORMAL LOW (ref 36.0–46.0)
Hemoglobin: 9.7 g/dL — ABNORMAL LOW (ref 12.0–15.0)
Lymphocytes Relative: 39 %
Lymphs Abs: 0.7 K/uL (ref 0.7–4.0)
MCH: 32.1 pg (ref 26.0–34.0)
MCHC: 31.7 g/dL (ref 30.0–36.0)
MCV: 101.3 fL — ABNORMAL HIGH (ref 80.0–100.0)
Monocytes Absolute: 0.1 K/uL (ref 0.1–1.0)
Monocytes Relative: 5 %
Neutro Abs: 1 K/uL — ABNORMAL LOW (ref 1.7–7.7)
Neutrophils Relative %: 54 %
Platelets: 44 K/uL — ABNORMAL LOW (ref 150–400)
RBC: 3.02 MIL/uL — ABNORMAL LOW (ref 3.87–5.11)
RDW: 14 % (ref 11.5–15.5)
WBC: 1.8 K/uL — ABNORMAL LOW (ref 4.0–10.5)
nRBC: 0 % (ref 0.0–0.2)

## 2023-11-09 LAB — IRON AND TIBC
Iron: 72 ug/dL (ref 28–170)
Saturation Ratios: 17 % (ref 10.4–31.8)
TIBC: 436 ug/dL (ref 250–450)
UIBC: 364 ug/dL

## 2023-11-09 LAB — COMPREHENSIVE METABOLIC PANEL WITH GFR
ALT: 29 U/L (ref 0–44)
AST: 30 U/L (ref 15–41)
Albumin: 3.4 g/dL — ABNORMAL LOW (ref 3.5–5.0)
Alkaline Phosphatase: 85 U/L (ref 38–126)
Anion gap: 13 (ref 5–15)
BUN: 40 mg/dL — ABNORMAL HIGH (ref 8–23)
CO2: 23 mmol/L (ref 22–32)
Calcium: 9 mg/dL (ref 8.9–10.3)
Chloride: 100 mmol/L (ref 98–111)
Creatinine, Ser: 1.43 mg/dL — ABNORMAL HIGH (ref 0.44–1.00)
GFR, Estimated: 36 mL/min — ABNORMAL LOW (ref >60)
Glucose, Bld: 463 mg/dL — ABNORMAL HIGH (ref 70–99)
Potassium: 4.3 mmol/L (ref 3.5–5.1)
Sodium: 136 mmol/L (ref 135–145)
Total Bilirubin: 0.7 mg/dL (ref 0.0–1.2)
Total Protein: 6.7 g/dL (ref 6.5–8.1)

## 2023-11-09 LAB — FERRITIN: Ferritin: 29 ng/mL (ref 11–307)

## 2023-11-09 LAB — FOLATE: Folate: 15.9 ng/mL (ref >5.9)

## 2023-11-09 LAB — MAGNESIUM: Magnesium: 2 mg/dL (ref 1.7–2.4)

## 2023-11-09 LAB — VITAMIN B12: Vitamin B-12: 402 pg/mL (ref 180–914)

## 2023-11-09 LAB — LACTATE DEHYDROGENASE: LDH: 137 U/L (ref 98–192)

## 2023-11-09 MED ORDER — EPOETIN ALFA-EPBX 10000 UNIT/ML IJ SOLN
10000.0000 [IU] | Freq: Once | INTRAMUSCULAR | Status: AC
  Administered 2023-11-09: 10000 [IU] via SUBCUTANEOUS
  Filled 2023-11-09: qty 1

## 2023-11-09 NOTE — Progress Notes (Signed)
 6 mth US  noted in recall

## 2023-11-09 NOTE — Progress Notes (Signed)
 Retacrit injection given per orders. Patient tolerated it well without problems. Vitals stable and discharged home from clinic ambulatory. Follow up as scheduled.

## 2023-11-09 NOTE — Patient Instructions (Signed)
 [facility] - A DEPT OF [facility]  Discharge Instructions: Thank you for choosing [facility] to provide your oncology and hematology care.  If you have a lab appointment with the Cancer Center - please note that after April 8th, 2024, all labs will be drawn in the cancer center.  You do not have to check in or register with the main entrance as you have in the past but will complete your check-in in the cancer center.  Wear comfortable clothing and clothing appropriate for easy access to any Portacath or PICC line.   We strive to give you quality time with your provider. You may need to reschedule your appointment if you arrive late (15 or more minutes).  Arriving late affects you and other patients whose appointments are after yours.  Also, if you miss three or more appointments without notifying the office, you may be dismissed from the clinic at the provider's discretion.      For prescription refill requests, have your pharmacy contact our office and allow 72 hours for refills to be completed.    Today you received the following injection Retacrit   To help prevent nausea and vomiting after your treatment, we encourage you to take your nausea medication as directed.  BELOW ARE SYMPTOMS THAT SHOULD BE REPORTED IMMEDIATELY: *FEVER GREATER THAN 100.4 F (38 C) OR HIGHER *CHILLS OR SWEATING *NAUSEA AND VOMITING THAT IS NOT CONTROLLED WITH YOUR NAUSEA MEDICATION *UNUSUAL SHORTNESS OF BREATH *UNUSUAL BRUISING OR BLEEDING *URINARY PROBLEMS (pain or burning when urinating, or frequent urination) *BOWEL PROBLEMS (unusual diarrhea, constipation, pain near the anus) TENDERNESS IN MOUTH AND THROAT WITH OR WITHOUT PRESENCE OF ULCERS (sore throat, sores in mouth, or a toothache) UNUSUAL RASH, SWELLING OR PAIN  UNUSUAL VAGINAL DISCHARGE OR ITCHING   Items with * indicate a potential emergency and should be followed up as soon as possible or go to the  Emergency Department if any problems should occur.  Please show the CHEMOTHERAPY ALERT CARD or IMMUNOTHERAPY ALERT CARD at check-in to the Emergency Department and triage nurse.  Should you have questions after your visit or need to cancel or reschedule your appointment, please contact [facility] [place] - A DEPT OF [facility] [phone]  and follow the prompts.  Office hours are 8:00 a.m. to 4:30 p.m. Monday - Friday. Please note that voicemails left after 4:00 p.m. may not be returned until the following business day.  We are closed weekends and major holidays. You have access to a nurse at all times for urgent questions. Please call the main number to the clinic [phone] and follow the prompts.  For any non-urgent questions, you may also contact your provider using MyChart. We now offer e-Visits for anyone 61 and older to request care online for non-urgent symptoms. For details visit [email or web address].   Also download the MyChart app! Go to the app store, search "MyChart", open the app, select [facility], and log in with your MyChart username and password.

## 2023-11-10 LAB — KAPPA/LAMBDA LIGHT CHAINS
Kappa free light chain: 93.1 mg/L — ABNORMAL HIGH (ref 3.3–19.4)
Kappa, lambda light chain ratio: 1.47 (ref 0.26–1.65)
Lambda free light chains: 63.3 mg/L — ABNORMAL HIGH (ref 5.7–26.3)

## 2023-11-11 ENCOUNTER — Encounter: Payer: Self-pay | Admitting: Oncology

## 2023-11-11 LAB — PROTEIN ELECTROPHORESIS, SERUM
A/G Ratio: 1.1 (ref 0.7–1.7)
Albumin ELP: 3.4 g/dL (ref 2.9–4.4)
Alpha-1-Globulin: 0.2 g/dL (ref 0.0–0.4)
Alpha-2-Globulin: 0.7 g/dL (ref 0.4–1.0)
Beta Globulin: 1.2 g/dL (ref 0.7–1.3)
Gamma Globulin: 0.8 g/dL (ref 0.4–1.8)
Globulin, Total: 3 g/dL (ref 2.2–3.9)
Total Protein ELP: 6.4 g/dL (ref 6.0–8.5)

## 2023-11-13 LAB — IMMUNOFIXATION ELECTROPHORESIS
IgA: 664 mg/dL — ABNORMAL HIGH (ref 64–422)
IgG (Immunoglobin G), Serum: 803 mg/dL (ref 586–1602)
IgM (Immunoglobulin M), Srm: 124 mg/dL (ref 26–217)
Total Protein ELP: 6.3 g/dL (ref 6.0–8.5)

## 2023-11-13 LAB — METHYLMALONIC ACID, SERUM: Methylmalonic Acid, Quantitative: 1483 nmol/L — ABNORMAL HIGH (ref 0–378)

## 2023-11-16 LAB — COPPER, SERUM: Copper: 97 ug/dL (ref 80–158)

## 2023-11-20 ENCOUNTER — Other Ambulatory Visit (INDEPENDENT_AMBULATORY_CARE_PROVIDER_SITE_OTHER): Payer: Self-pay | Admitting: Gastroenterology

## 2023-11-20 DIAGNOSIS — R1030 Lower abdominal pain, unspecified: Secondary | ICD-10-CM

## 2023-11-24 ENCOUNTER — Ambulatory Visit (INDEPENDENT_AMBULATORY_CARE_PROVIDER_SITE_OTHER): Admitting: Gastroenterology

## 2023-11-24 ENCOUNTER — Encounter (INDEPENDENT_AMBULATORY_CARE_PROVIDER_SITE_OTHER): Payer: Self-pay | Admitting: Gastroenterology

## 2023-11-24 VITALS — BP 136/76 | HR 75 | Temp 97.1°F | Ht 63.0 in | Wt 148.3 lb

## 2023-11-24 DIAGNOSIS — K582 Mixed irritable bowel syndrome: Secondary | ICD-10-CM

## 2023-11-24 DIAGNOSIS — K7460 Unspecified cirrhosis of liver: Secondary | ICD-10-CM | POA: Diagnosis not present

## 2023-11-24 DIAGNOSIS — K7581 Nonalcoholic steatohepatitis (NASH): Secondary | ICD-10-CM | POA: Diagnosis not present

## 2023-11-24 DIAGNOSIS — K7469 Other cirrhosis of liver: Secondary | ICD-10-CM

## 2023-11-24 DIAGNOSIS — I8510 Secondary esophageal varices without bleeding: Secondary | ICD-10-CM

## 2023-11-24 NOTE — Progress Notes (Unsigned)
 Toribio Fortune, M.D. Gastroenterology & Hepatology [facility] Gastroenterology [address] [place], [place] [address]  Primary Care Physician: Grooms, Charmaine, [place] [address] [place] [address]  I will communicate my assessment and recommendations to the referring MD via EMR.  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   The patient was last seen on 05/12/23. At that time, the patient was continued on Imodium as needed for diarrhea and dicyclomine  as needed for abdominal pain.  Also continued carvedilol  3.125 Mg Twice a Day.  In between her last appointment,she had shingles. Had to see ophthalmology and got treated for this.  She may have some lower abdominal pain before having a BM. States she has taken Imodium AD when she has diarrhea. Does not take it all the time. Sometimes has urgency and fecal soling.  Patient denies any complaints. The patient denies having any nausea, vomiting, fever, chills, hematochezia, melena, hematemesis, abdominal distention, abdominal pain, jaundice, pruritus or weight loss.  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  Patient has been receiving Retacrit  infusions and some doses of Feraheme .  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   Last CBC and iron stores were checked on 11/09/2023.   Ferritin was 29, CMP with normal aminotransferases, creatinine 1.43, hemoglobin 9.7, WBC 1.8 and platelets 44,000.  Previous iron stores on 08/03/2023 showed iron of 62, saturation of 16% and TIBC 393.  Cirrhosis related  questions: Hematemesis/coffee ground emesis: No Abdominal pain: yes, as above Abdominal distention/worsening ascites: No Fever/chills: No Episodes of confusion/disorientation: No Taking diuretics?: Yes, Lasix 20 mg as needed.  History of variceal bleeding: No Prior history of banding?: No, but had grade 2 esophageal varices on treatment with beta-blockers Prior episodes of SBP: No Last time liver imaging was performed: 11/08/23 US  - no masses Last AFP: 05/12/23  -2.4 MELD 3.0 score: 05/12/23  - 8 Vaccination status Hep A/B: Only immune to hepatitis A   Last EGD:06/17/2020 Grade II varices were found in the lower third of the esophagus. An area of irregularity was found in the posterior rim of the pyloric channed. This was visualized with the aid of a transparent cap. I pulled the area with a cold forceps and was able to see a medium-sized - 1 cm, frond-like/villous, non-circumferential mass with no bleeding and no stigmata of recent bleeding was found at the pylorus. It had some adenomatous features - unclear if coming from pylorus or from duodenal bulb. One biopsy was taken with a cold forceps for histology, no more biopsies were performed as patient presented significant bleeding that stopped on its own. The examined duodenum and ampulla were normal.  Path: Granulation tissue neg for malignancy or HP.  Notably, the patient was evaluated by [facility] by advanced endoscopist in the past and he was considered to be a high risk patient for endoscopic resection given severe thrombocytopenia.  Was advised to clinically monitor anemia.   Last Colonoscopy:06/2019 - two small cecal and SF polyps, one cecal 6 mm polyp. All polyps were TA. Hemorrhoids  Past Medical History: Past Medical History:  Diagnosis Date   Abnormal LFTs  Achilles bursitis or tendinitis    Acute maxillary sinusitis    Acute pharyngitis    Anemia, unspecified    Candidiasis of skin and nails    Dermatophytosis of  foot    Dermatophytosis of the body    Diverticulitis of colon (without mention of hemorrhage)(562.11)    Edema    Headache(784.0)    HOH (hard of hearing)    Malaise and fatigue    Occlusion and stenosis of carotid artery without mention of cerebral infarction    Osteoarthrosis, unspecified whether generalized or localized, unspecified site    Other dyspnea and respiratory abnormality    Other psoriasis    Other specified disorders of rotator cuff syndrome of shoulder and allied disorders    Pain in joint    Pure hypercholesterolemia    RUQ pain    Spasm of back muscles    Thrombocytopenia, unspecified    Type II or unspecified type diabetes mellitus without mention of complication, not stated as uncontrolled    Unspecified essential hypertension    Unspecified sinusitis (chronic)    Urinary incontinence     Past Surgical History: Past Surgical History:  Procedure Laterality Date   APPENDECTOMY  1964   BIOPSY  03/02/2017   Procedure: BIOPSY;  Surgeon: Golda Claudis PENNER, MD;  [facility];  Service: Endoscopy;;  antral   BIOPSY  06/17/2020   Procedure: BIOPSY;  Surgeon: Eartha Angelia Sieving, MD;  [facility];  Service: Gastroenterology;;   CAROTID ENDARTERECTOMY  2005   Left CEA   CAROTID ENDARTERECTOMY  2009   Right CEA   CATARACT EXTRACTION, BILATERAL Bilateral    CHOLECYSTECTOMY  1982   Gall Bladder   COLONOSCOPY  09   COLONOSCOPY N/A 06/27/2019   Procedure: COLONOSCOPY;  Surgeon: Golda Claudis PENNER, MD;  [facility];  Service: Endoscopy;  Laterality: N/A;   ESOPHAGOGASTRODUODENOSCOPY N/A 03/02/2017   Procedure: ESOPHAGOGASTRODUODENOSCOPY (EGD);  Surgeon: Golda Claudis PENNER, MD;  [facility];  Service: Endoscopy;  Laterality: N/A;  12:55   ESOPHAGOGASTRODUODENOSCOPY N/A 06/27/2019   Procedure: ESOPHAGOGASTRODUODENOSCOPY (EGD);  Surgeon: Golda Claudis PENNER, MD;  [facility];  Service: Endoscopy;  Laterality: N/A;  730    ESOPHAGOGASTRODUODENOSCOPY (EGD) WITH PROPOFOL  N/A 06/17/2020   Procedure: ESOPHAGOGASTRODUODENOSCOPY (EGD) WITH PROPOFOL ;  Surgeon: Eartha Angelia Sieving, MD;  [facility];  Service: Gastroenterology;  Laterality: N/A;  AM   GIVENS CAPSULE STUDY N/A 10/24/2019   Procedure: GIVENS CAPSULE STUDY;  Surgeon: Golda Claudis PENNER, MD;  [facility];  Service: Endoscopy;  Laterality: N/A;  730   KNEE ARTHROSCOPY WITH MEDIAL MENISECTOMY Left 07/10/2019   Procedure: KNEE ARTHROSCOPY WITH MEDIAL MENISCECTOMY;  Surgeon: Margrette Taft BRAVO, MD;  Location: [facility];  Service: Orthopedics;  Laterality: Left;   KNEE ARTHROSCOPY WITH MEDIAL MENISECTOMY Left 03/14/2020   Procedure: KNEE ARTHROSCOPY WITH MEDIAL MENISCECTOMY;  Surgeon: Margrette Taft BRAVO, MD;  Location: [facility];  Service: Orthopedics;  Laterality: Left;   NOSE SURGERY  1978   PARATHYROIDECTOMY  1992   POLYPECTOMY  03/02/2017   Procedure: POLYPECTOMY;  Surgeon: Golda Claudis PENNER, MD;  [facility];  Service: Endoscopy;;  gastric    POLYPECTOMY  06/27/2019   Procedure: POLYPECTOMY;  Surgeon: Golda Claudis PENNER, MD;  [facility];  Service: Endoscopy;;  colon   Power port  03/06/2009   Right upper chest    TONSILLECTOMY     TUMOR EXCISION Right August 24, 2013   [facility] Dr. Adrien Blush, ENT   VAGINAL HYSTERECTOMY  1972    Family History: Family History  Problem Relation Age of Onset   Heart attack Mother    Cancer Mother    Heart attack Father    Cancer Sister     Social History: Social History   Tobacco Use  Smoking Status Former   Current packs/day: 0.00   Average packs/day: 1 pack/day for 42.0 years (42.0 ttl pk-yrs)   Types: Cigarettes   Start date: 03/12/1950   Quit date: 03/12/1992   Years since quitting: 31.7   Passive exposure: Past  Smokeless Tobacco Former   Quit date: 12/16/1992   Social History   Substance and Sexual Activity  Alcohol  Use Yes    Alcohol /week: 1.0 standard drink of alcohol    Types: 1 Glasses of wine per week   Comment: very seldom   Social History   Substance and Sexual Activity  Drug Use No    Allergies: Allergies  Allergen Reactions   Penicillins Other (See Comments), Rash, Swelling and Anaphylaxis    Has patient had a PCN reaction causing immediate rash, facial/tongue/throat swelling, SOB or lightheadedness with hypotension: No  Has patient had a PCN reaction causing severe rash involving mucus membranes or skin necrosis: No  Has patient had a PCN reaction that required hospitalization: Yes  Has patient had a PCN reaction occurring within the last 10 years: No  If all of the above answers are NO, then may proceed with Cephalosporin use.  Other Reaction(s): Other (See Comments)  Has patient had a PCN reaction causing immediate rash, facial/tongue/throat swelling, SOB or lightheadedness with hypotension: No Has patient had a PCN reaction causing severe rash involving mucus membranes or skin necrosis: No Has patient had a PCN reaction that required hospitalization: Yes Has patient had a PCN reaction occurring within the last 10 years: No If all of the above answers are NO, then may proceed with Cephalosporin use.   Codeine  Nausea And Vomiting and Rash   [place] Purpureus Went Yeast Other (See Comments)    Headaches, myalgias  Other Reaction(s): Other (See Comments)  Headaches, myalgias Headaches, myalgias Headaches, myalgias    Headaches, myalgias   Niacin And Related Other (See Comments)    Headaches, myalgias   Red Yeast Rice Other (See Comments)    Headaches, myalgias   Actos [Pioglitazone Hydrochloride] Other (See Comments)    Severe headache   Amaryl Nausea Only and Other (See Comments)    Severe headache (patient is tolerating 2 mg dosing)   Iron Diarrhea   Sanctura [Trospium Chloride] Nausea Only and Other (See Comments)    Severe headache   Statins Other (See Comments)    Severe  headache,  Hips and leg weakness that cause patient not to be able to function as per patient.   Toviaz [Fesoterodine Fumarate] Other (See Comments)    Cramps, severe headache   Norco [Hydrocodone -Acetaminophen ] Nausea And Vomiting and Anxiety   Ultram  [Tramadol  Hcl] Nausea And Vomiting    Medications: Current Outpatient Medications  Medication Sig Dispense Refill   acetaminophen  (TYLENOL ) 500 MG tablet Take 1 tablet (500 mg total) by mouth every 6 (six) hours as needed for moderate pain or headache. 30 tablet 0   B-D UF III MINI PEN NEEDLES 31G X 5 MM MISC SMARTSIG:1 Pen Needle SUB-Q Daily     Biotin 10 MG CAPS Take by mouth daily at 6 (six) AM.     carvedilol  (  COREG ) 3.125 MG tablet Take 3.125 mg by mouth 2 (two) times daily.     Continuous Glucose Sensor (FREESTYLE LIBRE 3 PLUS SENSOR) MISC Change sensor every 15 days. 4 each 3   dicyclomine  (BENTYL ) 10 MG capsule TAKE 1 CAPSULE (10 MG TOTAL) BY MOUTH 3 (THREE) TIMES DAILY BEFORE MEALS. 270 capsule 0   furosemide (LASIX) 20 MG tablet Take 20 mg by mouth as needed. (Patient taking differently: Take 20 mg by mouth as needed. May take an additional 20 mg prn.)     gabapentin  (NEURONTIN ) 300 MG capsule Take 1 capsule (300 mg total) by mouth at bedtime. 90 capsule 2   insulin  aspart (NOVOLOG ) 100 UNIT/ML injection Inject 5 Units into the skin 3 (three) times daily before meals. Inject 5 Units into the skin with breakfast, with lunch, and with evening meal as needed. Sliding scale- 150-200= 3 units, 201-250= 5 units, 251-300= 8 units 10 mL PRN   Insulin  Glargine (BASAGLAR KWIKPEN) 100 UNIT/ML SMARTSIG:15-50 Unit(s) SUB-Q Daily (Patient taking differently: 15 Units daily.)     losartan  (COZAAR ) 25 MG tablet Take 1 tablet (25 mg total) by mouth daily. 90 tablet 1   magnesium  oxide (MAG-OX) 400 (240 Mg) MG tablet Take 1 tablet (400 mg total) by mouth daily. 30 tablet 11   metFORMIN  (GLUCOPHAGE ) 500 MG tablet Take 1 tablet (500 mg total) by mouth 2  (two) times daily with a meal. 180 tablet 2   ondansetron  (ZOFRAN ) 4 MG tablet Take 1 tablet (4 mg total) by mouth every 8 (eight) hours as needed for nausea or vomiting (Use as needed with your tramdol for nausea/vomiting). 30 tablet 0   ONETOUCH ULTRA test strip 1 (ONE) STRIP DAILY,DM2,E11.65     OVER THE COUNTER MEDICATION Compounded Hemorrhoid cream [facility]) Apply rectally up to four times per day.     traMADol  (ULTRAM ) 50 MG tablet Take 0.5 tablets (25 mg total) by mouth 2 (two) times daily as needed (Take 8 hours apart). 15 tablet 0   Multiple Vitamins-Minerals (EYE VITAMINS PO) Take by mouth. Red-2 for eyes (Patient not taking: Reported on 11/24/2023)     No current facility-administered medications for this visit.   Facility-Administered Medications Ordered in Other Visits  Medication Dose Route Frequency Provider Last Rate Last Admin   sodium chloride  irrigation 0.9 %    PRN Margrette Taft BRAVO, MD   1,000 mL at 03/14/20 0945    Review of Systems: GENERAL: negative for malaise, night sweats HEENT: No changes in hearing or vision, no nose bleeds or other nasal problems. NECK: Negative for lumps, goiter, pain and significant neck swelling RESPIRATORY: Negative for cough, wheezing CARDIOVASCULAR: Negative for chest pain, leg swelling, palpitations, orthopnea GI: SEE HPI MUSCULOSKELETAL: Negative for joint pain or swelling, back pain, and muscle pain. SKIN: Negative for lesions, rash PSYCH: Negative for sleep disturbance, mood disorder and recent psychosocial stressors. HEMATOLOGY Negative for prolonged bleeding, bruising easily, and swollen nodes. ENDOCRINE: Negative for cold or heat intolerance, polyuria, polydipsia and goiter. NEURO: negative for tremor, gait imbalance, syncope and seizures. The remainder of the review of systems is noncontributory.   Physical Exam: BP 136/76 (BP Location: Left Arm, Patient Position: Sitting, Cuff Size: Normal)   Pulse 75   Temp  (!) 97.1 F (36.2 C) (Temporal)   Ht 5' 3 (1.6 m)   Wt 148 lb 4.8 oz (67.3 kg)   BMI 26.27 kg/m  GENERAL: The patient is AO x3, in no acute distress. HEENT: Head is  normocephalic and atraumatic. EOMI are intact. Mouth is well hydrated and without lesions. NECK: Supple. No masses LUNGS: Clear to auscultation. No presence of rhonchi/wheezing/rales. Adequate chest expansion HEART: RRR, normal s1 and s2. ABDOMEN: Soft, nontender, no guarding, no peritoneal signs, and nondistended. BS +. No masses. EXTREMITIES: Without any cyanosis, clubbing, rash, lesions or edema. NEUROLOGIC: AOx3, no focal motor deficit. SKIN: no jaundice, no rashes  Imaging/Labs: as above  I personally reviewed and interpreted the available labs, imaging and endoscopic files.  Impression and Plan: Jeanette Chang is a 83 y.o. 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   Patient has presented a longstanding history of compensated liver cirrhosis with severe portal hypertension given the presence of grade 2 esophageal varices.  Patient has done relatively well through the years and denies having any complaints at the moment.  She is up-to-date in terms of HCC screening, although AFP will be ordered today to complement her regular screening.  Will also obtain surveillance labs today.  She will be continued the same dose of Coreg  for now and may uptitrate in the future appointment if tolerated.  She has presented longstanding history of IBS-D, for which she has been managing her symptoms with Imodium and dicyclomine  as needed, which has led to significant symptom relief.  This will be continued for now.  - Continue Imodium as needed for diarrhea -Continue with dicyclomine  every 12 hours as needed for abdominal pain - Continue carvedilol  3.125 mg BID - Check CBC, MELD labs and AFP  - Reduce salt intake to <2  g per day - Can take Tylenol  max of 2 g per day (650 mg q8h) for pain - Avoid NSAIDs for pain - Avoid eating raw oysters/shellfish - Protein shake (Ensure or Boost) every night before going to sleep - Follow up with hematology for pancytopenia/iron deficiency anemia  All questions were answered.      Toribio Fortune, MD Gastroenterology and Hepatology [facility] Gastroenterology

## 2023-11-24 NOTE — Patient Instructions (Addendum)
-   Continue Imodium as needed for diarrhea -Continue with dicyclomine  every 12 hours as needed for abdominal pain - Continue carvedilol  3.125 mg BID - Check CBC, MELD labs and AFP  - Reduce salt intake to <2 g per day - Can take Tylenol  max of 2 g per day (650 mg q8h) for pain - Avoid NSAIDs for pain - Avoid eating raw oysters/shellfish - Protein shake (Ensure or Boost) every night before going to sleep - Follow up with hematology for pancytopenia/iron deficiency anemia

## 2023-11-28 ENCOUNTER — Other Ambulatory Visit: Payer: Self-pay

## 2023-11-28 DIAGNOSIS — D509 Iron deficiency anemia, unspecified: Secondary | ICD-10-CM

## 2023-11-28 DIAGNOSIS — D631 Anemia in chronic kidney disease: Secondary | ICD-10-CM

## 2023-11-29 ENCOUNTER — Inpatient Hospital Stay

## 2023-11-29 ENCOUNTER — Inpatient Hospital Stay: Admitting: Oncology

## 2023-11-29 ENCOUNTER — Inpatient Hospital Stay: Attending: Hematology

## 2023-11-29 ENCOUNTER — Inpatient Hospital Stay: Attending: Hematology | Admitting: Physician Assistant

## 2023-11-29 VITALS — BP 141/54 | HR 77 | Temp 97.7°F | Resp 16 | Wt 147.5 lb

## 2023-11-29 DIAGNOSIS — D508 Other iron deficiency anemias: Secondary | ICD-10-CM

## 2023-11-29 DIAGNOSIS — N1831 Chronic kidney disease, stage 3a: Secondary | ICD-10-CM

## 2023-11-29 DIAGNOSIS — D631 Anemia in chronic kidney disease: Secondary | ICD-10-CM | POA: Diagnosis not present

## 2023-11-29 DIAGNOSIS — N1832 Chronic kidney disease, stage 3b: Secondary | ICD-10-CM | POA: Diagnosis not present

## 2023-11-29 DIAGNOSIS — D509 Iron deficiency anemia, unspecified: Secondary | ICD-10-CM

## 2023-11-29 DIAGNOSIS — D708 Other neutropenia: Secondary | ICD-10-CM

## 2023-11-29 DIAGNOSIS — D696 Thrombocytopenia, unspecified: Secondary | ICD-10-CM

## 2023-11-29 LAB — CBC WITH DIFFERENTIAL/PLATELET
Abs Immature Granulocytes: 0.01 K/uL (ref 0.00–0.07)
Basophils Absolute: 0 K/uL (ref 0.0–0.1)
Basophils Relative: 0 %
Eosinophils Absolute: 0.1 K/uL (ref 0.0–0.5)
Eosinophils Relative: 4 %
HCT: 31.8 % — ABNORMAL LOW (ref 36.0–46.0)
Hemoglobin: 10 g/dL — ABNORMAL LOW (ref 12.0–15.0)
Immature Granulocytes: 0 %
Lymphocytes Relative: 32 %
Lymphs Abs: 0.8 K/uL (ref 0.7–4.0)
MCH: 31.7 pg (ref 26.0–34.0)
MCHC: 31.4 g/dL (ref 30.0–36.0)
MCV: 101 fL — ABNORMAL HIGH (ref 80.0–100.0)
Monocytes Absolute: 0.2 K/uL (ref 0.1–1.0)
Monocytes Relative: 7 %
Neutro Abs: 1.4 K/uL — ABNORMAL LOW (ref 1.7–7.7)
Neutrophils Relative %: 57 %
Platelets: 49 K/uL — ABNORMAL LOW (ref 150–400)
RBC: 3.15 MIL/uL — ABNORMAL LOW (ref 3.87–5.11)
RDW: 14.1 % (ref 11.5–15.5)
WBC: 2.5 K/uL — ABNORMAL LOW (ref 4.0–10.5)
nRBC: 0 % (ref 0.0–0.2)

## 2023-11-29 LAB — SAMPLE TO BLOOD BANK

## 2023-11-29 NOTE — Assessment & Plan Note (Addendum)
 This is more or less stable.  Stop Retacrit .  She has been spearing seeing worsening bone pain every time she receives the Retacrit  injection. Symptoms last 2 to 3 weeks and she is unable to take any pain relief except for tramadol  which is too strong for her. We had a long discussion about stopping Retacrit  altogether and if she starts to feel poorly due to anemia, we could discuss potentially trying Aranesp which may work slightly better for her. They were in agreement.  She will get 4 doses of IV Venofer 200 mg which will likely improve her hemoglobin.  Will see her back in 3 months.

## 2023-11-29 NOTE — Assessment & Plan Note (Addendum)
-   Labs from today show more or less stable hemoglobin 10.0. -Iron levels were last drawn 2 weeks ago which showed a ferritin of 29 (55) with iron saturation of 17% and normal TIBC. Stable kidney function.  -Reports previously receiving IV iron last on 09/16/2023-510 mg IV Feraheme  with improvement of symptoms. -Reports occasional hematochezia from hemorrhoids.  No melena. -Patient has been not feeling great following iron infusions and this appears to be progressively worse since initially starting Feraheme . -Recommend switching to IV venofer 200 mg every 2 weeks x 4 doses.  Will also check hemoglobin. -Return to clinic in 3 months to repeat iron levels.

## 2023-11-29 NOTE — Assessment & Plan Note (Addendum)
-   Platelets are at baseline-49,000. -No evidence of bleeding.  Continue to monitor.

## 2023-11-29 NOTE — Progress Notes (Signed)
 Patient presents today for Retacrit  injection per providers order.  Hgb noted to be 10.0.  Vital signs stable.  Per MD patient refused her Retacrit  today.

## 2023-11-29 NOTE — Progress Notes (Unsigned)
 [facility] OFFICE PROGRESS NOTE  Grooms, [place], [place]  ASSESSMENT & PLAN:    Assessment & Plan Other iron deficiency anemia - Labs from today show more or less stable hemoglobin 10.0. -Iron levels were last drawn 2 weeks ago which showed a ferritin of 29 (55) with iron saturation of 17% and normal TIBC. Stable kidney function.  -Reports previously receiving IV iron last on 09/16/2023-510 mg IV Feraheme  with improvement of symptoms. -Reports occasional hematochezia from hemorrhoids.  No melena. -Patient has been not feeling great following iron infusions and this appears to be progressively worse since initially starting Feraheme . -Recommend switching to IV venofer 200 mg every 2 weeks x 4 doses.  Will also check hemoglobin. -Return to clinic in 3 months to repeat iron levels.  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  Normal duodenum. - Colonoscopy (06/29/2019): Perianal skin tags found on perianal exam.  2 small polyps at the splenic flexure.  External hemorrhoids. - BMBX (09/09/2020): Normocellular marrow (20%) with TLH, including minimal dyserythropoiesis and 1% blasts by manual aspirate differential.  Mild dyserythropoiesis which does not fulfill criteria for MDS.  Karyotype 45, X, -X[4]/46, XX[16], loss of 1 X chromosome is rare finding in the bone marrow is of older woman and when present as a minor clone (less than 50% of 20 metaphases) is thought to be most likely aging effect. - CT AP (04/24/2021): Spleen enlarged measuring 14.1 cm with volume 632 mL.  Liver has nodular contour consistent with cirrhosis. - Labs from December 2023 showed normal B12/MMA, folate, copper , SPEP.  CKD stage IIIa/b - No B symptoms or recent infections. She has easy bruising.  -Labs from today show persistent neutropenia but improvement from 2 weeks ago.  White blood cell count 2.5  with an ANC of 1.4. -No indications for bone marrow at this time. Thrombocytopenia - Platelets are at baseline-49,000. -No evidence of bleeding.  Continue to monitor. Anemia in stage 3a chronic kidney disease (HCC) This is more or less stable.  Stop Retacrit .  She has been spearing seeing worsening bone pain every time she receives the Retacrit  injection. Symptoms last 2 to 3 weeks and she is unable to take any pain relief except for tramadol  which is too strong for her. We had a long discussion about stopping Retacrit  altogether and if she starts to feel poorly due to anemia, we could discuss potentially trying Aranesp which may work slightly better for her. They were in agreement.  She will get 4 doses of IV Venofer 200 mg which will likely improve her hemoglobin.  Will see her back in 3 months.  Orders Placed This Encounter  Procedures   Ferritin    Standing Status:   Future    Expected Date:   02/29/2024    Expiration Date:   05/29/2024    Release to patient:   Immediate   Iron and TIBC    Standing Status:   Future    Expected Date:   02/29/2024    Expiration Date:   05/29/2024    Release to patient:   Immediate   Vitamin B12    Standing Status:   Future    Expected Date:   02/29/2024    Expiration Date:   05/29/2024   CBC with Differential/Platelet    Standing Status:   Future    Expected Date:   02/29/2024    Expiration Date:  05/29/2024    Release to patient:   Immediate   Comprehensive metabolic panel with GFR    Standing Status:   Future    Expected Date:   02/29/2024    Expiration Date:   05/29/2024   Methylmalonic acid, serum    Standing Status:   Future    Expected Date:   02/29/2024    Expiration Date:   05/29/2024   Lactate dehydrogenase    Standing Status:   Future    Expected Date:   02/29/2024    Expiration Date:   05/29/2024    INTERVAL HISTORY: Patient returns for follow-up for IDA, thrombocytopenia and neutropenia.  She has history of Hollie cirrhosis and is  followed by gastroenterology whom she saw recently.  She has been receiving intermittent IV iron given last on 09/16/2023.  Tolerates well.  Denies any bright red blood per rectum, melena or hematochezia.  Daughter mentions worsening bone pain following Retacrit  injections.  Reports 2 to 3 days worth of throbbing bone pain.  They are wondering if they stopped the Retacrit , what would happen.  She was given tramadol  to see if that might help but it made her feel funny.  She also does not feel well following iron infusions.  She has only received IV Feraheme  in the past.    We reviewed cbc, cmp, iron panel, ferritin, b12, protein electrophoresis, MMA, copper , LDH, KLLC  SUMMARY OF HEMATOLOGIC HISTORY: Oncology History Overview Note  1.   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  Normal duodenum. - Colonoscopy (06/29/2019): Perianal skin tags found on perianal exam.  2 small polyps at the splenic flexure.  External hemorrhoids. - BMBX (09/09/2020): Normocellular marrow (20%) with TLH, including minimal dyserythropoiesis and 1% blasts by manual aspirate differential.  Mild dyserythropoiesis which does not fulfill criteria for MDS.  Karyotype 45, X, -X[4]/46, XX[16], loss of one X chromosome is rare finding in the bone marrow is of older woman and when present as a minor clone (less than 50% of 20 metaphases) is thought to be most likely aging effect. - Labs from December 2023 showed normal B12, folate, copper , SPEP.  CKD stage II/IIIa based on creatinine (CKD stage IIIb based on elevated Cystatin C 1.78) - History of PRBC transfusions x 2 in October 2023 - Most recent IV iron with Feraheme  on 08/10/2023, but with increased fatigue following iron infusion. - Started on Retacrit  08/11/2022 - current dose 10,000 units every 2 weeks, but reports significant bone pain after  Retacrit     No history exists.     CBC    Component Value Date/Time   WBC 2.5 (L) 11/29/2023 1211   RBC 3.15 (L) 11/29/2023 1211   HGB 10.0 (L) 11/29/2023 1211   HCT 31.8 (L) 11/29/2023 1211   PLT 49 (L) 11/29/2023 1211   MCV 101.0 (H) 11/29/2023 1211   MCH 31.7 11/29/2023 1211   MCHC 31.4 11/29/2023 1211   RDW 14.1 11/29/2023 1211   LYMPHSABS 0.8 11/29/2023 1211   MONOABS 0.2 11/29/2023 1211   EOSABS 0.1 11/29/2023 1211   BASOSABS 0.0 11/29/2023 1211       Latest Ref Rng & Units 11/09/2023    2:04 PM 09/24/2023   12:49 PM 08/03/2023    1:33 PM  CMP  Glucose 70 - 99 mg/dL 536  852  800   BUN 8 - 23 mg/dL 40  21  40   Creatinine 0.44 - 1.00 mg/dL 8.56  9.00  8.71   Sodium 135 - 145 mmol/L 136  138  139   Potassium 3.5 - 5.1 mmol/L 4.3  5.8  4.4   Chloride 98 - 111 mmol/L 100  106  106   CO2 22 - 32 mmol/L 23  22  20    Calcium 8.9 - 10.3 mg/dL 9.0  9.4  9.2   Total Protein 6.5 - 8.1 g/dL 6.7  7.1  6.5   Total Bilirubin 0.0 - 1.2 mg/dL 0.7  0.9  0.5   Alkaline Phos 38 - 126 U/L 85  89  72   AST 15 - 41 U/L 30  41  32   ALT 0 - 44 U/L 29  26  25       Lab Results  Component Value Date   FERRITIN 29 11/09/2023   VITAMINB12 402 11/09/2023    Vitals:   11/29/23 1311  BP: (!) 141/54  Pulse: 77  Resp: 16  Temp: 97.7 F (36.5 C)  SpO2: 99%    Review of System:  Review of Systems  Respiratory:  Positive for shortness of breath.   Cardiovascular:  Positive for chest pain.  Gastrointestinal:  Positive for diarrhea.  Musculoskeletal:  Positive for joint pain.       Bone pain with Retacrit   Neurological:  Positive for tingling and headaches.  Psychiatric/Behavioral:  The patient has insomnia.     Physical Exam: Physical Exam Constitutional:      Appearance: Normal appearance.  HENT:     Head: Normocephalic and atraumatic.  Eyes:     Pupils: Pupils are equal, round, and reactive to light.  Cardiovascular:     Rate and Rhythm: Normal rate and regular rhythm.      Heart sounds: Normal heart sounds. No murmur heard. Pulmonary:     Effort: Pulmonary effort is normal.     Breath sounds: Normal breath sounds. No wheezing.  Abdominal:     General: Bowel sounds are normal. There is no distension.     Palpations: Abdomen is soft.     Tenderness: There is no abdominal tenderness.  Musculoskeletal:        General: Normal range of motion.     Cervical back: Normal range of motion.  Skin:    General: Skin is warm and dry.     Findings: No rash.  Neurological:     Mental Status: She is alert and oriented to person, place, and time.     Gait: Gait is intact.  Psychiatric:        Mood and Affect: Mood and affect normal.        Cognition and Memory: Memory normal.        Judgment: Judgment normal.      I spent 25 minutes dedicated to the care of this patient (face-to-face and non-face-to-face) on the date of the encounter to include what is described in the assessment and plan.,  Delon Hope, NP 11/29/2023 3:02 PM

## 2023-11-29 NOTE — Assessment & Plan Note (Addendum)
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  Normal duodenum. - Colonoscopy (06/29/2019): Perianal skin tags found on perianal exam.  2 small polyps at the splenic flexure.  External hemorrhoids. - BMBX (09/09/2020): Normocellular marrow (20%) with TLH, including minimal dyserythropoiesis and 1% blasts by manual aspirate differential.  Mild dyserythropoiesis which does not fulfill criteria for MDS.  Karyotype 45, X, -X[4]/46, XX[16], loss of 1 X chromosome is rare finding in the bone marrow is of older woman and when present as a minor clone (less than 50% of 20 metaphases) is thought to be most likely aging effect. - CT AP (04/24/2021): Spleen enlarged measuring 14.1 cm with volume 632 mL.  Liver has nodular contour consistent with cirrhosis. - Labs from December 2023 showed normal B12/MMA, folate, copper , SPEP.  CKD stage IIIa/b - No B symptoms or recent infections. She has easy bruising.  -Labs from today show persistent neutropenia but improvement from 2 weeks ago.  White blood cell count 2.5 with an ANC of 1.4. -No indications for bone marrow at this time.

## 2023-12-02 ENCOUNTER — Ambulatory Visit: Admitting: Physician Assistant

## 2023-12-05 ENCOUNTER — Other Ambulatory Visit: Payer: Self-pay | Admitting: Physician Assistant

## 2023-12-05 ENCOUNTER — Encounter: Payer: Self-pay | Admitting: Physician Assistant

## 2023-12-05 ENCOUNTER — Ambulatory Visit (INDEPENDENT_AMBULATORY_CARE_PROVIDER_SITE_OTHER): Admitting: Physician Assistant

## 2023-12-05 VITALS — BP 140/59 | HR 76 | Temp 97.6°F | Ht 63.0 in | Wt 144.0 lb

## 2023-12-05 DIAGNOSIS — Z794 Long term (current) use of insulin: Secondary | ICD-10-CM | POA: Diagnosis not present

## 2023-12-05 DIAGNOSIS — N1831 Chronic kidney disease, stage 3a: Secondary | ICD-10-CM

## 2023-12-05 DIAGNOSIS — D631 Anemia in chronic kidney disease: Secondary | ICD-10-CM | POA: Diagnosis not present

## 2023-12-05 DIAGNOSIS — N189 Chronic kidney disease, unspecified: Secondary | ICD-10-CM | POA: Insufficient documentation

## 2023-12-05 DIAGNOSIS — E1165 Type 2 diabetes mellitus with hyperglycemia: Secondary | ICD-10-CM

## 2023-12-05 NOTE — Assessment & Plan Note (Signed)
 Hemoglobin levels have not improved significantly with current treatment, and she experiences significant pain from infusions. A new iron infusion is under consideration with hematology consultation. - Consider new type of iron infusion to improve iron levels. - Monitor hemoglobin and iron levels regularly. - Discussed discontinuation with hematologist.  - No new anemia associated symptoms reported today.

## 2023-12-05 NOTE — Assessment & Plan Note (Signed)
 Blood sugar levels fluctuate with episodes of hyperglycemia, particularly postprandial, reaching over 300 mg/dL and occasionally 577 mg/dL despite insulin  therapy. Fast-acting insulin  is effective but requires careful monitoring. Lengthy discussion on treatment plan and importance of compliance.  - Continue 15 units of nighttime insulin . - Administer fast-acting insulin  based on sliding scale for blood sugar over 250 mg/dL. Sliding scale given on AVS.  - Check blood sugar pre-meals and adjust insulin  based on carbohydrate intake. - A1c, CMP, and urine ACR today.  - Follow up in 3 months, or sooner for persistent hyperglycemia.

## 2023-12-05 NOTE — Assessment & Plan Note (Signed)
 Recent follow up with nephrologist. Kidney function remains unchanged and well-managed with no acute deterioration. - Continue monitoring kidney function with nephrology, follow up as scheduled.

## 2023-12-05 NOTE — Progress Notes (Signed)
 Established Patient Office Visit  Subjective   Patient ID: Jeanette Chang, female    DOB: 1940-09-04  Age: 83 y.o. MRN: [id number]  Chief Complaint  Patient presents with   3 month follow up     Diabetes and controlling blood sugars   Discussed the use of AI scribe software for clinical note transcription with the patient, who gave verbal consent to proceed.  History of Present Illness Jeanette Chang is an 83 year old female with stage 3 chronic kidney disease and diabetes who presents with issues related to blood sugar management and kidney function.  She experiences significant fluctuations in blood sugar levels, particularly postprandially, with levels reaching up to 422 mg/dL. She administers 15 units of nighttime insulin  and uses 4 units of fast-acting insulin  when levels exceed 300 mg/dL. Her blood sugar is monitored with a CGM, and alerts her when levels exceed 250 mg/dL.   She reports stage 3 chronic kidney disease for 10-15 years with stable kidney function according to her nephrologist. She experiences intermittent peripheral edema, particularly in her legs, and takes diuretics. An incident of uncertainty regarding an additional diuretic dose led to a hospital visit. Her potassium levels have been elevated in the past.  She has a history of anemia and receives iron infusions. Her iron levels have been fluctuating, and she is awaiting a new type of iron infusion. No new chest pain or dyspnea. She can take deep breaths without pain.   Review of Systems  Constitutional:  Negative for chills, fever and malaise/fatigue.  Eyes:  Negative for blurred vision and double vision.  Respiratory:  Negative for cough and shortness of breath.   Cardiovascular:  Negative for chest pain and palpitations.  Musculoskeletal:  Negative for joint pain and myalgias.  Neurological:  Negative for dizziness and headaches.      Objective:     BP (!) 140/59   Pulse 76   Temp 97.6 F (36.4 C)    Ht 5' 3 (1.6 m)   Wt 144 lb (65.3 kg)   SpO2 97%   BMI 25.51 kg/m    Physical Exam Constitutional:      General: She is not in acute distress.    Appearance: Normal appearance. She is not ill-appearing.  HENT:     Head: Normocephalic and atraumatic.     Mouth/Throat:     Mouth: Mucous membranes are moist.     Pharynx: Oropharynx is clear.  Eyes:     Extraocular Movements: Extraocular movements intact.     Conjunctiva/sclera: Conjunctivae normal.  Cardiovascular:     Rate and Rhythm: Normal rate and regular rhythm.     Heart sounds: Normal heart sounds. No murmur heard. Pulmonary:     Effort: Pulmonary effort is normal.     Breath sounds: Normal breath sounds.  Musculoskeletal:     Right lower leg: No edema.     Left lower leg: No edema.  Skin:    General: Skin is warm and dry.  Neurological:     General: No focal deficit present.     Mental Status: She is alert and oriented to person, place, and time.  Psychiatric:        Mood and Affect: Mood normal.        Behavior: Behavior normal.     No results found for any visits on 12/05/23.  The ASCVD Risk score (Arnett DK, et al., 2019) failed to calculate for the following reasons:   The 2019 ASCVD risk score is only  valid for ages 57 to 63    Assessment & Plan:   Return in about 3 months (around 03/06/2024) for DM.   Type 2 diabetes mellitus with hyperglycemia, with long-term current use of insulin  (HCC) Assessment & Plan: Blood sugar levels fluctuate with episodes of hyperglycemia, particularly postprandial, reaching over 300 mg/dL and occasionally 577 mg/dL despite insulin  therapy. Fast-acting insulin  is effective but requires careful monitoring. Lengthy discussion on treatment plan and importance of compliance.  - Continue 15 units of nighttime insulin . - Administer fast-acting insulin  based on sliding scale for blood sugar over 250 mg/dL. Sliding scale given on AVS.  - Check blood sugar pre-meals and adjust  insulin  based on carbohydrate intake. - A1c, CMP, and urine ACR today.  - Follow up in 3 months, or sooner for persistent hyperglycemia.   Orders: -     Hemoglobin A1c -     Comprehensive metabolic panel with GFR -     Microalbumin / creatinine urine ratio  Anemia in stage 3a chronic kidney disease (HCC) Assessment & Plan: Hemoglobin levels have not improved significantly with current treatment, and she experiences significant pain from infusions. A new iron infusion is under consideration with hematology consultation. - Consider new type of iron infusion to improve iron levels. - Monitor hemoglobin and iron levels regularly. - Discussed discontinuation with hematologist.  - No new anemia associated symptoms reported today.    Stage 3a chronic kidney disease (HCC) Assessment & Plan: Recent follow up with nephrologist. Kidney function remains unchanged and well-managed with no acute deterioration. - Continue monitoring kidney function with nephrology, follow up as scheduled.      Jeanette Bristal Steffy, PA-C

## 2023-12-05 NOTE — Patient Instructions (Signed)
 Fast acting insulin - 150-200- 3 units 201-250- 4 units 251-300- 5 units 301-350- 6 units 351- 400 -8 units

## 2023-12-06 ENCOUNTER — Other Ambulatory Visit: Payer: Self-pay | Admitting: Physician Assistant

## 2023-12-06 ENCOUNTER — Telehealth: Payer: Self-pay | Admitting: *Deleted

## 2023-12-06 MED ORDER — DEXCOM G7 SENSOR MISC: 1.0000 | 3 refills | Status: DC

## 2023-12-06 NOTE — Telephone Encounter (Signed)
 Copied from CRM [id number]. Topic: Clinical - Medication Prior Auth >> Dec 06, 2023 10:28 AM Lonell PEDLAR wrote: Reason for CRM: Medicaid part B rejected Continuous Glucose Sensor (FREESTYLE LIBRE 3 PLUS SENSOR) MISC. Patient would like to go about getting this approved.

## 2023-12-06 NOTE — Telephone Encounter (Signed)
 Grooms, [place], [place]      12/06/23  1:05 PM Will try for DexCom.

## 2023-12-08 ENCOUNTER — Other Ambulatory Visit (INDEPENDENT_AMBULATORY_CARE_PROVIDER_SITE_OTHER): Payer: Self-pay | Admitting: Gastroenterology

## 2023-12-08 DIAGNOSIS — E1165 Type 2 diabetes mellitus with hyperglycemia: Secondary | ICD-10-CM | POA: Diagnosis not present

## 2023-12-08 DIAGNOSIS — K7581 Nonalcoholic steatohepatitis (NASH): Secondary | ICD-10-CM | POA: Diagnosis not present

## 2023-12-08 DIAGNOSIS — Z794 Long term (current) use of insulin: Secondary | ICD-10-CM | POA: Diagnosis not present

## 2023-12-08 DIAGNOSIS — K7469 Other cirrhosis of liver: Secondary | ICD-10-CM | POA: Diagnosis not present

## 2023-12-09 ENCOUNTER — Ambulatory Visit (INDEPENDENT_AMBULATORY_CARE_PROVIDER_SITE_OTHER): Payer: Self-pay | Admitting: Gastroenterology

## 2023-12-09 LAB — CBC/DIFF AMBIGUOUS DEFAULT
Basophils Absolute: 0 x10E3/uL (ref 0.0–0.2)
Basos: 1 %
EOS (ABSOLUTE): 0.1 x10E3/uL (ref 0.0–0.4)
Eos: 3 %
Hematocrit: 26.8 % — ABNORMAL LOW (ref 34.0–46.6)
Hemoglobin: 8.3 g/dL — ABNORMAL LOW (ref 11.1–15.9)
Immature Grans (Abs): 0 x10E3/uL (ref 0.0–0.1)
Immature Granulocytes: 0 %
Lymphocytes Absolute: 0.8 x10E3/uL (ref 0.7–3.1)
Lymphs: 40 %
MCH: 30.7 pg (ref 26.6–33.0)
MCHC: 31 g/dL — ABNORMAL LOW (ref 31.5–35.7)
MCV: 99 fL — ABNORMAL HIGH (ref 79–97)
Monocytes Absolute: 0.1 x10E3/uL (ref 0.1–0.9)
Monocytes: 5 %
Neutrophils Absolute: 1 x10E3/uL — ABNORMAL LOW (ref 1.4–7.0)
Neutrophils: 51 %
Platelets: 48 x10E3/uL — CL (ref 150–450)
RBC: 2.7 x10E6/uL — CL (ref 3.77–5.28)
RDW: 13.1 % (ref 11.7–15.4)
WBC: 1.9 x10E3/uL — CL (ref 3.4–10.8)

## 2023-12-09 LAB — COMPREHENSIVE METABOLIC PANEL WITH GFR
ALT: 21 IU/L (ref 0–32)
AST: 32 IU/L (ref 0–40)
Albumin: 3.9 g/dL (ref 3.7–4.7)
Alkaline Phosphatase: 94 IU/L (ref 48–129)
BUN/Creatinine Ratio: 27 (ref 12–28)
BUN: 35 mg/dL — ABNORMAL HIGH (ref 8–27)
Bilirubin Total: 0.3 mg/dL (ref 0.0–1.2)
CO2: 21 mmol/L (ref 20–29)
Calcium: 9.4 mg/dL (ref 8.7–10.3)
Chloride: 103 mmol/L (ref 96–106)
Creatinine, Ser: 1.31 mg/dL — ABNORMAL HIGH (ref 0.57–1.00)
Globulin, Total: 2.9 g/dL (ref 1.5–4.5)
Glucose: 241 mg/dL — ABNORMAL HIGH (ref 70–99)
Potassium: 4.4 mmol/L (ref 3.5–5.2)
Sodium: 144 mmol/L (ref 134–144)
Total Protein: 6.8 g/dL (ref 6.0–8.5)
eGFR: 40 mL/min/1.73 — ABNORMAL LOW (ref >59)

## 2023-12-09 LAB — PROTIME-INR
INR: 1.1 (ref 0.9–1.2)
Prothrombin Time: 11.4 s (ref 9.1–12.0)

## 2023-12-09 LAB — AFP TUMOR MARKER: AFP, Serum, Tumor Marker: <1.8 ng/mL (ref 0.0–8.7)

## 2023-12-09 LAB — SPECIMEN STATUS REPORT

## 2023-12-10 LAB — COMPREHENSIVE METABOLIC PANEL WITH GFR
ALT: 20 IU/L (ref 0–32)
AST: 30 IU/L (ref 0–40)
Albumin: 3.9 g/dL (ref 3.7–4.7)
Alkaline Phosphatase: 88 IU/L (ref 48–129)
BUN/Creatinine Ratio: 27 (ref 12–28)
BUN: 35 mg/dL — ABNORMAL HIGH (ref 8–27)
Bilirubin Total: 0.3 mg/dL (ref 0.0–1.2)
CO2: 21 mmol/L (ref 20–29)
Calcium: 9.4 mg/dL (ref 8.7–10.3)
Chloride: 102 mmol/L (ref 96–106)
Creatinine, Ser: 1.31 mg/dL — ABNORMAL HIGH (ref 0.57–1.00)
Globulin, Total: 2.6 g/dL (ref 1.5–4.5)
Glucose: 242 mg/dL — ABNORMAL HIGH (ref 70–99)
Potassium: 4.3 mmol/L (ref 3.5–5.2)
Sodium: 142 mmol/L (ref 134–144)
Total Protein: 6.5 g/dL (ref 6.0–8.5)
eGFR: 40 mL/min/1.73 — ABNORMAL LOW (ref >59)

## 2023-12-10 LAB — MICROALBUMIN / CREATININE URINE RATIO
Creatinine, Urine: 43.6 mg/dL
Microalb/Creat Ratio: <7 mg/g{creat} (ref 0–29)
Microalbumin, Urine: <3 ug/mL

## 2023-12-10 LAB — HEMOGLOBIN A1C
Est. average glucose Bld gHb Est-mCnc: 177 mg/dL
Hgb A1c MFr Bld: 7.8 % — ABNORMAL HIGH (ref 4.8–5.6)

## 2023-12-12 ENCOUNTER — Other Ambulatory Visit: Payer: Self-pay

## 2023-12-12 DIAGNOSIS — D508 Other iron deficiency anemias: Secondary | ICD-10-CM

## 2023-12-13 ENCOUNTER — Other Ambulatory Visit: Payer: Self-pay | Admitting: Physician Assistant

## 2023-12-13 ENCOUNTER — Ambulatory Visit: Payer: Self-pay | Admitting: Physician Assistant

## 2023-12-13 ENCOUNTER — Inpatient Hospital Stay

## 2023-12-13 VITALS — BP 138/51 | HR 70 | Temp 97.6°F | Resp 18

## 2023-12-13 DIAGNOSIS — D631 Anemia in chronic kidney disease: Secondary | ICD-10-CM | POA: Diagnosis not present

## 2023-12-13 DIAGNOSIS — E1165 Type 2 diabetes mellitus with hyperglycemia: Secondary | ICD-10-CM

## 2023-12-13 DIAGNOSIS — D508 Other iron deficiency anemias: Secondary | ICD-10-CM

## 2023-12-13 DIAGNOSIS — D509 Iron deficiency anemia, unspecified: Secondary | ICD-10-CM

## 2023-12-13 DIAGNOSIS — N1832 Chronic kidney disease, stage 3b: Secondary | ICD-10-CM | POA: Diagnosis not present

## 2023-12-13 LAB — CBC WITH DIFFERENTIAL/PLATELET
Abs Immature Granulocytes: 0.01 K/uL (ref 0.00–0.07)
Basophils Absolute: 0 K/uL (ref 0.0–0.1)
Basophils Relative: 1 %
Eosinophils Absolute: 0.1 K/uL (ref 0.0–0.5)
Eosinophils Relative: 4 %
HCT: 26.1 % — ABNORMAL LOW (ref 36.0–46.0)
Hemoglobin: 8.1 g/dL — ABNORMAL LOW (ref 12.0–15.0)
Immature Granulocytes: 1 %
Lymphocytes Relative: 39 %
Lymphs Abs: 0.8 K/uL (ref 0.7–4.0)
MCH: 30.8 pg (ref 26.0–34.0)
MCHC: 31 g/dL (ref 30.0–36.0)
MCV: 99.2 fL (ref 80.0–100.0)
Monocytes Absolute: 0.2 K/uL (ref 0.1–1.0)
Monocytes Relative: 8 %
Neutro Abs: 0.9 K/uL — ABNORMAL LOW (ref 1.7–7.7)
Neutrophils Relative %: 47 %
Platelets: 45 K/uL — ABNORMAL LOW (ref 150–400)
RBC: 2.63 MIL/uL — ABNORMAL LOW (ref 3.87–5.11)
RDW: 14.3 % (ref 11.5–15.5)
WBC: 1.9 K/uL — ABNORMAL LOW (ref 4.0–10.5)
nRBC: 0 % (ref 0.0–0.2)

## 2023-12-13 LAB — SAMPLE TO BLOOD BANK

## 2023-12-13 MED ORDER — SODIUM CHLORIDE 0.9 % IV SOLN: INTRAVENOUS | Status: DC

## 2023-12-13 MED ORDER — ACETAMINOPHEN 325 MG PO TABS
650.0000 mg | ORAL_TABLET | Freq: Once | ORAL | Status: AC
  Administered 2023-12-13: 650 mg via ORAL
  Filled 2023-12-13: qty 2

## 2023-12-13 MED ORDER — IRON SUCROSE 200 MG IVPB - SIMPLE MED
200.0000 mg | Freq: Once | Status: AC
  Administered 2023-12-13: 200 mg via INTRAVENOUS
  Filled 2023-12-13: qty 200

## 2023-12-13 MED ORDER — CETIRIZINE HCL 10 MG/ML IV SOLN
5.0000 mg | Freq: Once | INTRAVENOUS | Status: AC
  Administered 2023-12-13: 5 mg via INTRAVENOUS
  Filled 2023-12-13: qty 1

## 2023-12-13 NOTE — Progress Notes (Signed)
Venofer given today per MD orders. Tolerated infusion without adverse affects. Vital signs stable. No complaints at this time. Discharged from clinic ambulatory in stable condition. Alert and oriented x 3. F/U with [facility] as scheduled.

## 2023-12-13 NOTE — Progress Notes (Signed)
 Patient presents today for iron infusion.  Patient is in satisfactory condition with no new complaints voiced.  Vital signs are stable.  We will proceed with infusion per provider orders.    Peripheral IV started with good blood return pre and post infusion.  Venofer 200 mg IV iron infusion given today per MD orders. Tolerated infusion without adverse affects. Vital signs stable. No complaints at this time. Discharged from clinic ambulatory in stable condition. Alert and oriented x 3. [facility].

## 2023-12-13 NOTE — Patient Instructions (Signed)
 [facility] - A DEPT OF [facility]  Discharge Instructions: Thank you for choosing [facility] to provide your oncology and hematology care.  If you have a lab appointment with the Cancer Center - please note that after April 8th, 2024, all labs will be drawn in the cancer center.  You do not have to check in or register with the main entrance as you have in the past but will complete your check-in in the cancer center.  Wear comfortable clothing and clothing appropriate for easy access to any Portacath or PICC line.   We strive to give you quality time with your provider. You may need to reschedule your appointment if you arrive late (15 or more minutes).  Arriving late affects you and other patients whose appointments are after yours.  Also, if you miss three or more appointments without notifying the office, you may be dismissed from the clinic at the provider's discretion.      For prescription refill requests, have your pharmacy contact our office and allow 72 hours for refills to be completed.    Today you received Venofer  200 mg IV iron  infusion.       BELOW ARE SYMPTOMS THAT SHOULD BE REPORTED IMMEDIATELY: *FEVER GREATER THAN 100.4 F (38 C) OR HIGHER *CHILLS OR SWEATING *NAUSEA AND VOMITING THAT IS NOT CONTROLLED WITH YOUR NAUSEA MEDICATION *UNUSUAL SHORTNESS OF BREATH *UNUSUAL BRUISING OR BLEEDING *URINARY PROBLEMS (pain or burning when urinating, or frequent urination) *BOWEL PROBLEMS (unusual diarrhea, constipation, pain near the anus) TENDERNESS IN MOUTH AND THROAT WITH OR WITHOUT PRESENCE OF ULCERS (sore throat, sores in mouth, or a toothache) UNUSUAL RASH, SWELLING OR PAIN  UNUSUAL VAGINAL DISCHARGE OR ITCHING   Items with * indicate a potential emergency and should be followed up as soon as possible or go to the Emergency Department if any problems should occur.  Please show the CHEMOTHERAPY ALERT CARD or IMMUNOTHERAPY ALERT  CARD at check-in to the Emergency Department and triage nurse.  Should you have questions after your visit or need to cancel or reschedule your appointment, please contact [facility] [place] - A DEPT OF [facility] [phone]  and follow the prompts.  Office hours are 8:00 a.m. to 4:30 p.m. Monday - Friday. Please note that voicemails left after 4:00 p.m. may not be returned until the following business day.  We are closed weekends and major holidays. You have access to a nurse at all times for urgent questions. Please call the main number to the clinic [phone] and follow the prompts.  For any non-urgent questions, you may also contact your provider using MyChart. We now offer e-Visits for anyone 89 and older to request care online for non-urgent symptoms. For details visit [email or web address].   Also download the MyChart app! Go to the app store, search MyChart, open the app, select [place], and log in with your MyChart username and password.

## 2023-12-13 NOTE — Progress Notes (Signed)
Called patient and voicemail has not been set up yet

## 2023-12-14 ENCOUNTER — Ambulatory Visit: Payer: Self-pay | Admitting: Oncology

## 2023-12-14 ENCOUNTER — Other Ambulatory Visit: Payer: Self-pay | Admitting: Oncology

## 2023-12-14 NOTE — Progress Notes (Signed)
 Hey Tomi,   We recently stopped her Retacrit  due to bone pain.  We call and see how she is feeling.  I think the plan was to see if she was symptomatic and how low she may go.  Other option is to bring her back for Aranesp and see if that when is better tolerated.  Delon Hope, NP 12/14/2023 12:44 PM

## 2023-12-14 NOTE — Progress Notes (Signed)
 Patient states that pain has improved immensely with trial of being off of Retacrit .  She is not opposed to trying Aranesp and will schedule.

## 2023-12-15 ENCOUNTER — Telehealth: Payer: Self-pay | Admitting: *Deleted

## 2023-12-15 NOTE — Telephone Encounter (Signed)
 Daughter called and said that the last 2 times she has received iron she develops diarrhea afterwards and has experienced some (maybe 2 tbsp) of blood in stool. I advised that it was more than likely irritation from diarrhea. She is no longer having diarrhea or bleeding.  Will make us  aware if this continues.  Delon Hope, NP made aware.

## 2023-12-16 ENCOUNTER — Telehealth: Payer: Self-pay

## 2023-12-16 ENCOUNTER — Inpatient Hospital Stay

## 2023-12-16 VITALS — BP 110/40 | HR 84 | Temp 97.9°F | Resp 18

## 2023-12-16 DIAGNOSIS — D631 Anemia in chronic kidney disease: Secondary | ICD-10-CM | POA: Diagnosis not present

## 2023-12-16 DIAGNOSIS — D509 Iron deficiency anemia, unspecified: Secondary | ICD-10-CM

## 2023-12-16 DIAGNOSIS — N1832 Chronic kidney disease, stage 3b: Secondary | ICD-10-CM | POA: Diagnosis not present

## 2023-12-16 MED ORDER — DARBEPOETIN ALFA 100 MCG/0.5ML IJ SOSY
100.0000 ug | PREFILLED_SYRINGE | Freq: Once | INTRAMUSCULAR | Status: AC
  Administered 2023-12-16: 100 ug via SUBCUTANEOUS
  Filled 2023-12-16: qty 0.5

## 2023-12-16 NOTE — Progress Notes (Signed)
 HGB 8.1 from labs on 12/13/2023. BP within parameters for treatment. Jeanette Chang presents today for injection per the provider's orders.  Aranesp administration without incident; injection site WNL; see MAR for injection details.  Patient tolerated procedure well and without incident.  No questions or concerns noted. Discharged from clinic ambulatory in stable condition. Alert and oriented x 3. F/U with [facility] as scheduled.

## 2023-12-16 NOTE — Patient Instructions (Signed)
 [facility] [place] - A DEPT OF [place]  Discharge Instructions: Thank you for choosing [facility] to provide your oncology and hematology care.  If you have a lab appointment with the Cancer Center - please note that after April 8th, 2024, all labs will be drawn in the cancer center.  You do not have to check in or register with the main entrance as you have in the past but will complete your check-in in the cancer center.  Wear comfortable clothing and clothing appropriate for easy access to any Portacath or PICC line.   We strive to give you quality time with your provider. You may need to reschedule your appointment if you arrive late (15 or more minutes).  Arriving late affects you and other patients whose appointments are after yours.  Also, if you miss three or more appointments without notifying the office, you may be dismissed from the clinic at the provider's discretion.      For prescription refill requests, have your pharmacy contact our office and allow 72 hours for refills to be completed.    Today you received the following chemotherapy and/or immunotherapy agents aranesp. Darbepoetin Alfa Injection What is this medication? DARBEPOETIN ALFA (dar be POE e tin AL fa) treats low levels of red blood cells (anemia) caused by kidney disease or chemotherapy. It works by systems analyst make more red blood cells, which reduces the need for blood transfusions. This medicine may be used for other purposes; ask your health care provider or pharmacist if you have questions. COMMON BRAND NAME(S): Aranesp What should I tell my care team before I take this medication? They need to know if you have any of these conditions: Blood clots Cancer Heart disease High blood pressure On dialysis Seizures Stroke An unusual or allergic reaction to darbepoetin, latex, other medications, foods, dyes, or preservatives Pregnant or trying to get  pregnant Breast-feeding How should I use this medication? This medication is injected into a vein or under the skin. It is usually given by a care team in a hospital or clinic setting. It may also be given at home. If you get this medication at home, you will be taught how to prepare and give it. Use exactly as directed. Take it as directed on the prescription label at the same time every day. Keep taking it unless your care team tells you to stop. It is important that you put your used needles and syringes in a special sharps container. Do not put them in a trash can. If you do not have a sharps container, call your pharmacist or care team to get one. A special MedGuide will be given to you by the pharmacist with each prescription and refill. Be sure to read this information carefully each time. Talk to your care team about the use of this medication in children. While this medication may be used in children as young as 1 month of age for selected conditions, precautions do apply. Overdosage: If you think you have taken too much of this medicine contact a poison control center or emergency room at once. NOTE: This medicine is only for you. Do not share this medicine with others. What if I miss a dose? If you miss a dose, take it as soon as you can. If it is almost time for your next dose, take only that dose. Do not take double or extra doses. What may interact with this medication? Epoetin  alfa  Methoxy polyethylene glycol-epoetin  beta This list may not describe all possible interactions. Give your health care provider a list of all the medicines, herbs, non-prescription drugs, or dietary supplements you use. Also tell them if you smoke, drink alcohol , or use illegal drugs. Some items may interact with your medicine. What should I watch for while using this medication? Visit your care team for regular checks on your progress. Check your blood pressure as directed. Know what your blood pressure  should be and when to contact your care team. Your condition will be monitored carefully while you are receiving this medication. You may need blood work while taking this medication. What side effects may I notice from receiving this medication? Side effects that you should report to your care team as soon as possible: Allergic reactions--skin rash, itching, hives, swelling of the face, lips, tongue, or throat Blood clot--pain, swelling, or warmth in the leg, shortness of breath, chest pain Heart attack--pain or tightness in the chest, shoulders, arms, or jaw, nausea, shortness of breath, cold or clammy skin, feeling faint or lightheaded Increase in blood pressure Rash, fever, and swollen lymph nodes Redness, blistering, peeling, or loosening of the skin, including inside the mouth Seizures Stroke--sudden numbness or weakness of the face, arm, or leg, trouble speaking, confusion, trouble walking, loss of balance or coordination, dizziness, severe headache, change in vision Side effects that usually do not require medical attention (report to your care team if they continue or are bothersome): Cough Stomach pain Swelling of the ankles, hands, or feet This list may not describe all possible side effects. Call your doctor for medical advice about side effects. You may report side effects to FDA at 1-800-FDA-1088. Where should I keep my medication? Keep out of the reach of children and pets. Store in a refrigerator. Do not freeze. Do not shake. Protect from light. Keep this medication in the original container until you are ready to take it. See product for storage information. Get rid of any unused medication after the expiration date. To get rid of medications that are no longer needed or have expired: Take the medication to a medication take-back program. Check with your pharmacy or law enforcement to find a location. If you cannot return the medication, ask your pharmacist or care team how to  get rid of the medication safely. NOTE: This sheet is a summary. It may not cover all possible information. If you have questions about this medicine, talk to your doctor, pharmacist, or health care provider.  2024 Elsevier/Gold Standard (2021-06-03 00:00:00)      To help prevent nausea and vomiting after your treatment, we encourage you to take your nausea medication as directed.  BELOW ARE SYMPTOMS THAT SHOULD BE REPORTED IMMEDIATELY: *FEVER GREATER THAN 100.4 F (38 C) OR HIGHER *CHILLS OR SWEATING *NAUSEA AND VOMITING THAT IS NOT CONTROLLED WITH YOUR NAUSEA MEDICATION *UNUSUAL SHORTNESS OF BREATH *UNUSUAL BRUISING OR BLEEDING *URINARY PROBLEMS (pain or burning when urinating, or frequent urination) *BOWEL PROBLEMS (unusual diarrhea, constipation, pain near the anus) TENDERNESS IN MOUTH AND THROAT WITH OR WITHOUT PRESENCE OF ULCERS (sore throat, sores in mouth, or a toothache) UNUSUAL RASH, SWELLING OR PAIN  UNUSUAL VAGINAL DISCHARGE OR ITCHING   Items with * indicate a potential emergency and should be followed up as soon as possible or go to the Emergency Department if any problems should occur.  Please show the CHEMOTHERAPY ALERT CARD or IMMUNOTHERAPY ALERT CARD at check-in to the Emergency Department and triage nurse.  Should you  have questions after your visit or need to cancel or reschedule your appointment, please contact [facility] [place] - A DEPT OF [facility] [phone]  and follow the prompts.  Office hours are 8:00 a.m. to 4:30 p.m. Monday - Friday. Please note that voicemails left after 4:00 p.m. may not be returned until the following business day.  We are closed weekends and major holidays. You have access to a nurse at all times for urgent questions. Please call the main number to the clinic [phone] and follow the prompts.  For any non-urgent questions, you may also contact your provider using MyChart. We now offer e-Visits for anyone 28  and older to request care online for non-urgent symptoms. For details visit [email or web address].   Also download the MyChart app! Go to the app store, search MyChart, open the app, select [place], and log in with your MyChart username and password.

## 2023-12-16 NOTE — Progress Notes (Signed)
 Care Guide Pharmacy Note  12/16/2023 Name: Jeanette Chang MRN: [id number] DOB: [birth year]  Referred By: Mancil Pfeiffer, PA-C Reason for referral: Complex Care Management (Outreach to schedule with Pharm d )   Jeanette Chang is a 83 y.o. year old female who is a primary care patient of Grooms, [place]fe[place], [place].  Jeanette Chang was referred to the pharmacist for assistance related to: DMII  Successful contact was made with the patient to discuss pharmacy services including being ready for the pharmacist to call at least 5 minutes before the scheduled appointment time and to have medication bottles and any blood pressure readings ready for review. The patient agreed to meet with the pharmacist via telephone visit on (date/time). 12/19/2023  Jeoffrey Buffalo , RMA     [place]  [facility], [facility] Guide  Direct Dial: [phone]  Website: [email or web address]

## 2023-12-19 ENCOUNTER — Encounter

## 2023-12-19 DIAGNOSIS — Z794 Long term (current) use of insulin: Secondary | ICD-10-CM

## 2023-12-20 MED ORDER — NOVOLOG FLEXPEN RELION 100 UNIT/ML ~~LOC~~ SOPN: 5.0000 [IU] | PEN_INJECTOR | Freq: Three times a day (TID) | SUBCUTANEOUS | 11 refills | Status: DC

## 2023-12-20 NOTE — Progress Notes (Signed)
 error    This encounter was created in error - please disregard.

## 2023-12-21 ENCOUNTER — Encounter (INDEPENDENT_AMBULATORY_CARE_PROVIDER_SITE_OTHER): Payer: Self-pay | Admitting: Gastroenterology

## 2023-12-23 ENCOUNTER — Telehealth: Payer: Self-pay | Admitting: Pharmacist

## 2023-12-23 NOTE — Telephone Encounter (Signed)
 New orders for libre 3 plus CGM sent to [facility] via [facility] portal  Awaiting approval  Mliss Tarry Griffin, PharmD, BCACP, CPP Clinical Pharmacist, [facility]

## 2023-12-27 ENCOUNTER — Inpatient Hospital Stay

## 2023-12-27 ENCOUNTER — Other Ambulatory Visit: Payer: Self-pay | Admitting: Physician Assistant

## 2023-12-27 ENCOUNTER — Inpatient Hospital Stay: Attending: Hematology

## 2023-12-27 VITALS — BP 135/47 | HR 61 | Temp 97.6°F | Resp 18

## 2023-12-27 DIAGNOSIS — D509 Iron deficiency anemia, unspecified: Secondary | ICD-10-CM

## 2023-12-27 DIAGNOSIS — D508 Other iron deficiency anemias: Secondary | ICD-10-CM | POA: Insufficient documentation

## 2023-12-27 DIAGNOSIS — N1831 Chronic kidney disease, stage 3a: Secondary | ICD-10-CM

## 2023-12-27 LAB — HEMOGLOBIN: Hemoglobin: 8.2 g/dL — ABNORMAL LOW (ref 12.0–15.0)

## 2023-12-27 MED ORDER — IRON SUCROSE 200 MG IVPB - SIMPLE MED
200.0000 mg | Freq: Once | Status: AC
  Administered 2023-12-27: 200 mg via INTRAVENOUS
  Filled 2023-12-27: qty 200

## 2023-12-27 MED ORDER — SODIUM CHLORIDE 0.9 % IV SOLN: INTRAVENOUS | Status: DC

## 2023-12-27 MED ORDER — TRAMADOL HCL 50 MG PO TABS: 25.0000 mg | ORAL_TABLET | Freq: Two times a day (BID) | ORAL | 0 refills | Status: DC | PRN

## 2023-12-27 NOTE — Progress Notes (Signed)
 Patient presents today for Venofer infusion per providers order.  Vital signs WNL.  Patient has no new complaints at this time.  Peripheral IV started and blood return noted pre and post infusion.  Stable during infusion without adverse affects.  Vital signs stable.  No complaints at this time.  Discharge from clinic ambulatory in stable condition.  Alert and oriented X 3.  Follow up with [facility] as scheduled.

## 2023-12-27 NOTE — Progress Notes (Signed)
 Patient's daughter Harriet) spoke with me in hallway today while her mother was getting labs drawn. She is concerned regarding patient deteriorating.  Lab checks and ESA injections seem to be taking more out of her than they are helping her, and patient is reportedly considering stopping injections and follow-up.  It sounds like she has also been having significantly worsening pain lately.  Discussed that patient may benefit from referral to palliative care.  Apolinar would like to hold off on this until she has a chance to speak with her sister and her mother.  Refill Rx will be sent for patient's tramadol .  We will bring patient in next week for goals of care conversation, further plan TBD.  Pleasant CHRISTELLA Barefoot, PA-C 12/27/23 6:38 PM

## 2023-12-27 NOTE — Patient Instructions (Signed)

## 2023-12-29 DIAGNOSIS — R069 Unspecified abnormalities of breathing: Secondary | ICD-10-CM | POA: Diagnosis not present

## 2023-12-29 DIAGNOSIS — R110 Nausea: Secondary | ICD-10-CM | POA: Diagnosis not present

## 2023-12-29 DIAGNOSIS — I110 Hypertensive heart disease with heart failure: Secondary | ICD-10-CM | POA: Diagnosis present

## 2023-12-29 DIAGNOSIS — E1122 Type 2 diabetes mellitus with diabetic chronic kidney disease: Secondary | ICD-10-CM | POA: Diagnosis present

## 2023-12-29 DIAGNOSIS — Z885 Allergy status to narcotic agent status: Secondary | ICD-10-CM | POA: Diagnosis not present

## 2023-12-29 DIAGNOSIS — R000 Tachycardia, unspecified: Secondary | ICD-10-CM | POA: Diagnosis not present

## 2023-12-29 DIAGNOSIS — I4510 Unspecified right bundle-branch block: Secondary | ICD-10-CM | POA: Diagnosis not present

## 2023-12-29 DIAGNOSIS — Z7984 Long term (current) use of oral hypoglycemic drugs: Secondary | ICD-10-CM | POA: Diagnosis not present

## 2023-12-29 DIAGNOSIS — H4323 Crystalline deposits in vitreous body, bilateral: Secondary | ICD-10-CM | POA: Diagnosis not present

## 2023-12-29 DIAGNOSIS — R0902 Hypoxemia: Secondary | ICD-10-CM | POA: Diagnosis not present

## 2023-12-29 DIAGNOSIS — R609 Edema, unspecified: Secondary | ICD-10-CM | POA: Diagnosis not present

## 2023-12-29 DIAGNOSIS — J9601 Acute respiratory failure with hypoxia: Secondary | ICD-10-CM | POA: Diagnosis present

## 2023-12-29 DIAGNOSIS — H353132 Nonexudative age-related macular degeneration, bilateral, intermediate dry stage: Secondary | ICD-10-CM | POA: Diagnosis not present

## 2023-12-29 DIAGNOSIS — D696 Thrombocytopenia, unspecified: Secondary | ICD-10-CM | POA: Diagnosis present

## 2023-12-29 DIAGNOSIS — N1831 Chronic kidney disease, stage 3a: Secondary | ICD-10-CM | POA: Diagnosis present

## 2023-12-29 DIAGNOSIS — E875 Hyperkalemia: Secondary | ICD-10-CM | POA: Diagnosis present

## 2023-12-29 DIAGNOSIS — K7460 Unspecified cirrhosis of liver: Secondary | ICD-10-CM | POA: Diagnosis present

## 2023-12-29 DIAGNOSIS — I452 Bifascicular block: Secondary | ICD-10-CM | POA: Diagnosis present

## 2023-12-29 DIAGNOSIS — R918 Other nonspecific abnormal finding of lung field: Secondary | ICD-10-CM | POA: Diagnosis not present

## 2023-12-29 DIAGNOSIS — R0600 Dyspnea, unspecified: Secondary | ICD-10-CM | POA: Diagnosis not present

## 2023-12-29 DIAGNOSIS — R059 Cough, unspecified: Secondary | ICD-10-CM | POA: Diagnosis not present

## 2023-12-29 DIAGNOSIS — Z66 Do not resuscitate: Secondary | ICD-10-CM | POA: Diagnosis present

## 2023-12-29 DIAGNOSIS — D61818 Other pancytopenia: Secondary | ICD-10-CM | POA: Diagnosis present

## 2023-12-29 DIAGNOSIS — R0602 Shortness of breath: Secondary | ICD-10-CM | POA: Diagnosis not present

## 2023-12-29 DIAGNOSIS — I4891 Unspecified atrial fibrillation: Secondary | ICD-10-CM | POA: Diagnosis present

## 2023-12-29 DIAGNOSIS — I081 Rheumatic disorders of both mitral and tricuspid valves: Secondary | ICD-10-CM | POA: Diagnosis present

## 2023-12-29 DIAGNOSIS — Z90710 Acquired absence of both cervix and uterus: Secondary | ICD-10-CM | POA: Diagnosis not present

## 2023-12-29 DIAGNOSIS — R079 Chest pain, unspecified: Secondary | ICD-10-CM | POA: Diagnosis not present

## 2023-12-29 DIAGNOSIS — I10 Essential (primary) hypertension: Secondary | ICD-10-CM | POA: Diagnosis not present

## 2023-12-29 DIAGNOSIS — Z880 Allergy status to penicillin: Secondary | ICD-10-CM | POA: Diagnosis not present

## 2023-12-29 DIAGNOSIS — Z79899 Other long term (current) drug therapy: Secondary | ICD-10-CM | POA: Diagnosis not present

## 2023-12-29 DIAGNOSIS — I5021 Acute systolic (congestive) heart failure: Secondary | ICD-10-CM | POA: Diagnosis not present

## 2023-12-29 DIAGNOSIS — R7989 Other specified abnormal findings of blood chemistry: Secondary | ICD-10-CM | POA: Diagnosis present

## 2023-12-29 DIAGNOSIS — I214 Non-ST elevation (NSTEMI) myocardial infarction: Secondary | ICD-10-CM | POA: Diagnosis present

## 2023-12-29 DIAGNOSIS — D509 Iron deficiency anemia, unspecified: Secondary | ICD-10-CM | POA: Diagnosis present

## 2023-12-29 DIAGNOSIS — R0689 Other abnormalities of breathing: Secondary | ICD-10-CM | POA: Diagnosis not present

## 2023-12-29 DIAGNOSIS — Z7982 Long term (current) use of aspirin: Secondary | ICD-10-CM | POA: Diagnosis not present

## 2023-12-29 DIAGNOSIS — Z794 Long term (current) use of insulin: Secondary | ICD-10-CM | POA: Diagnosis not present

## 2023-12-29 DIAGNOSIS — R739 Hyperglycemia, unspecified: Secondary | ICD-10-CM | POA: Diagnosis not present

## 2023-12-29 DIAGNOSIS — I5043 Acute on chronic combined systolic (congestive) and diastolic (congestive) heart failure: Secondary | ICD-10-CM | POA: Diagnosis present

## 2023-12-30 ENCOUNTER — Telehealth: Payer: Self-pay

## 2023-12-30 ENCOUNTER — Encounter (HOSPITAL_COMMUNITY): Payer: Self-pay

## 2023-12-30 ENCOUNTER — Telehealth: Payer: Self-pay | Admitting: Cardiology

## 2023-12-30 ENCOUNTER — Inpatient Hospital Stay (HOSPITAL_COMMUNITY): Admit: 2023-12-30 | Admitting: Internal Medicine

## 2023-12-30 DIAGNOSIS — J9601 Acute respiratory failure with hypoxia: Secondary | ICD-10-CM | POA: Diagnosis present

## 2023-12-30 DIAGNOSIS — I5021 Acute systolic (congestive) heart failure: Secondary | ICD-10-CM | POA: Diagnosis present

## 2023-12-30 DIAGNOSIS — R451 Restlessness and agitation: Secondary | ICD-10-CM | POA: Diagnosis present

## 2023-12-30 DIAGNOSIS — K117 Disturbances of salivary secretion: Secondary | ICD-10-CM | POA: Diagnosis present

## 2023-12-30 DIAGNOSIS — K7460 Unspecified cirrhosis of liver: Secondary | ICD-10-CM | POA: Diagnosis present

## 2023-12-30 DIAGNOSIS — I214 Non-ST elevation (NSTEMI) myocardial infarction: Secondary | ICD-10-CM | POA: Diagnosis present

## 2023-12-30 DIAGNOSIS — R918 Other nonspecific abnormal finding of lung field: Secondary | ICD-10-CM | POA: Diagnosis not present

## 2023-12-30 DIAGNOSIS — Z6825 Body mass index (BMI) 25.0-25.9, adult: Secondary | ICD-10-CM | POA: Diagnosis not present

## 2023-12-30 DIAGNOSIS — K7581 Nonalcoholic steatohepatitis (NASH): Secondary | ICD-10-CM | POA: Diagnosis present

## 2023-12-30 DIAGNOSIS — R0789 Other chest pain: Secondary | ICD-10-CM | POA: Diagnosis not present

## 2023-12-30 DIAGNOSIS — Z515 Encounter for palliative care: Secondary | ICD-10-CM | POA: Diagnosis not present

## 2023-12-30 DIAGNOSIS — Z90710 Acquired absence of both cervix and uterus: Secondary | ICD-10-CM | POA: Diagnosis not present

## 2023-12-30 DIAGNOSIS — Z90722 Acquired absence of ovaries, bilateral: Secondary | ICD-10-CM | POA: Diagnosis not present

## 2023-12-30 DIAGNOSIS — Z9851 Tubal ligation status: Secondary | ICD-10-CM | POA: Diagnosis not present

## 2023-12-30 DIAGNOSIS — Z7984 Long term (current) use of oral hypoglycemic drugs: Secondary | ICD-10-CM | POA: Diagnosis not present

## 2023-12-30 DIAGNOSIS — I4891 Unspecified atrial fibrillation: Secondary | ICD-10-CM | POA: Diagnosis not present

## 2023-12-30 DIAGNOSIS — Z8249 Family history of ischemic heart disease and other diseases of the circulatory system: Secondary | ICD-10-CM | POA: Diagnosis not present

## 2023-12-30 DIAGNOSIS — R54 Age-related physical debility: Secondary | ICD-10-CM | POA: Diagnosis present

## 2023-12-30 DIAGNOSIS — Z66 Do not resuscitate: Secondary | ICD-10-CM | POA: Diagnosis present

## 2023-12-30 DIAGNOSIS — E119 Type 2 diabetes mellitus without complications: Secondary | ICD-10-CM | POA: Diagnosis present

## 2023-12-30 DIAGNOSIS — I509 Heart failure, unspecified: Secondary | ICD-10-CM | POA: Diagnosis not present

## 2023-12-30 DIAGNOSIS — Z9049 Acquired absence of other specified parts of digestive tract: Secondary | ICD-10-CM | POA: Diagnosis not present

## 2023-12-30 DIAGNOSIS — J90 Pleural effusion, not elsewhere classified: Secondary | ICD-10-CM | POA: Diagnosis not present

## 2023-12-30 DIAGNOSIS — R0600 Dyspnea, unspecified: Secondary | ICD-10-CM | POA: Diagnosis not present

## 2023-12-30 DIAGNOSIS — Z7409 Other reduced mobility: Secondary | ICD-10-CM | POA: Diagnosis present

## 2023-12-30 DIAGNOSIS — F419 Anxiety disorder, unspecified: Secondary | ICD-10-CM | POA: Diagnosis present

## 2023-12-30 DIAGNOSIS — R0602 Shortness of breath: Secondary | ICD-10-CM | POA: Diagnosis not present

## 2023-12-30 DIAGNOSIS — E669 Obesity, unspecified: Secondary | ICD-10-CM | POA: Diagnosis present

## 2023-12-30 DIAGNOSIS — N189 Chronic kidney disease, unspecified: Secondary | ICD-10-CM | POA: Diagnosis not present

## 2023-12-30 DIAGNOSIS — D732 Chronic congestive splenomegaly: Secondary | ICD-10-CM | POA: Diagnosis present

## 2023-12-30 DIAGNOSIS — E1122 Type 2 diabetes mellitus with diabetic chronic kidney disease: Secondary | ICD-10-CM | POA: Diagnosis not present

## 2023-12-30 DIAGNOSIS — D696 Thrombocytopenia, unspecified: Secondary | ICD-10-CM | POA: Diagnosis present

## 2023-12-30 DIAGNOSIS — D509 Iron deficiency anemia, unspecified: Secondary | ICD-10-CM | POA: Diagnosis present

## 2023-12-30 DIAGNOSIS — I110 Hypertensive heart disease with heart failure: Secondary | ICD-10-CM | POA: Diagnosis present

## 2023-12-30 NOTE — Telephone Encounter (Signed)
 Copied from CRM #[id number]. Topic: Clinical - Prescription Issue >> Dec 30, 2023  2:41 PM Delon DASEN wrote: Reason for CRM: Jeanette Chang with [facility], need alternative number for paitent, not able to reach on the number given- gave home phone number

## 2023-12-30 NOTE — Telephone Encounter (Signed)
 ON-CALL CARDIOLOGY 12/30/23  Patient's name: Jeanette Chang.   MRN: [id number].    DOB: 10-27-1940 Primary care provider: Grooms, [place], [place]. Primary cardiologist: unknown   Requesting provider: Richarda, NP at [facility] with cardiology Consulting provider: Dr. Michele Locker regarding this patient's care today: Patient presented to [facility] with a chief complaint of chest pain and shortness of breath.  According to the nurse practitioner patient's initial rhythm was A-fib with RVR, A-fib appears to be new.  She has now converted spontaneously sinus rhythm.  Prior to her arrival to the ED she was having chest discomfort.  Unable to comment on exertional discomfort or pain resolved with resting her sublingual nitroglycerin tablets.  Other symptoms include shortness of breath.  Serial troponins were checked and given her presentation she ruled in for NSTEMI    I do not have the EKGs available for review but according to the nurse practitioner no ST segment elevations to suggest STEMI.  Patient has multiple electrolyte abnormalities, bicytopenia, and multiple noncardiac comorbid conditions.  No documented hx of HIT.   Her current CODE STATUS is DNR/DNI.  They wish to transfer here for further evaluation.   Impression: Newly discovered atrial fibrillation. Chest pain. Shortness of breath. NSTEMI Hyperkalemia. Hypomagnesemia. Bicytopenia  Recommendations: Recommended that the patient be transferred to medicine service here and when she arrives cardiology will see in consult.     Telephone encounter total time: 18 minutes   No charge - only documentation.   Madonna Michele HAS, [facility]  A Division of [facility] [address], [place], [place] [address]  12/30/2023 9:25 AM

## 2024-01-02 ENCOUNTER — Inpatient Hospital Stay: Admitting: Physician Assistant

## 2024-01-10 ENCOUNTER — Inpatient Hospital Stay

## 2024-01-16 NOTE — Care Plan (Signed)
 Notified by nursing staff that patient is requiring more frequent medications for symptom management (dyspnea, restlessness, respiratory secretions).  - Will start Dilaudid PCA - basal rate 0.4mg /hr with bolus dosing 0.2mg  q15 minutes PRN. Clinician dose 1mg  q1h PRN  - Will give additional 0.2mg  IV glycopyrrolate now  - Will start Versed  1mg  q2h scheduled and will leave PRN doses available. Should patient require more frequent dosing of Versed  for symptom management, would start Versed  PCA. Family for now prefers scheduled pushes in place of PCA.

## 2024-01-16 NOTE — Nursing Note (Signed)
 Record of Death  MRN: [id number]    Name: Jeanette Chang    DOB: Jun 26, 1940     SSN: [id number]  Date of Death: 01/23/2024             Time of Death: 2114/01/30  Discharge Location/Unit:: [facility]  Death Certificate: Who will sign death certificate?: Jeanette Retort NP [place] Phone/Contact number: [phone]  Contact Notification: Name:: Jeanette Chang Contact Person Relationship to Patient: Daughter Contact Person Phone Number: [phone]  Special Handling: Special Handling: None    Belongings: Belongings/Valuables List: ring Belongings/Valuables Disposition: Sent with body  Disposition: Release of Body/Permission & Receipt of personal effects form completed and signed?: Yes Disposition: Morgue Body Hold: No    Candidate for donation: [employer] Referral Number: [id number] [employer] OPO Representative's Name: Jeanette Chang ([employer]) Notified by:: Lauren RN    Is patient a potential donor?: Yes Donation Type: Tissue            Medical Examiner/Coroner Case: Reportable Situations:: NA           Autopsy (non-Me/Coroner):  Autopsy Requested (Non-ME/Coroner Cases): Not requested                          Funeral Home/Cremation Services: Funeral Home/Cremation Service Name:: [facility] Home/Cremation Services Notified: No Funeral Home/Cremation Service phone number: [phone]   Notifications: Notifications: Nursing Supervisor/Manager

## 2024-01-16 NOTE — Progress Notes (Signed)
 [facility]  [facility] [id number] Attending Physician:  Sheena FORBES Burows, MD Length of Stay:  3  No chief complaint on file.  Palliative Encounter                                                             Progress Note  Chief Complaint: end of life care      Assessment  Jeanette Chang is a 83 y.o. 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 She required BiPAP for respiratory support and was transferred to [facility] for cardiology care. On arrival to [facility], patient was reportedly hypotensive requiring norepinephrine, on BiPAP with tachypnea, and reported being short of breath. Patient declined cardiology consult and reported preference for comfort focused care. She was transitioned to comfort measures only at that time and was admitted to the palliative care service.   - Today's discussion and interval history-  Patient seen and examined in room with family at bedside. Patient is resting with eyes closed. She appears comfortable and with non-labored breathing. She is receiving PRN dilaudid and ativan for management of dyspnea and anxiety/restlessness. Family reports that her symptoms have been well managed with her current medications. Her blood pressure is a bit higher today so we discussed moving to the palliative care unit and her daughter was in agreement with this plan. We discussed that if patient remains stable over the next few days, we could potentially talk about moving to the hospice house. For now, we will transfer patient to [facility] for further care.   Summary-    - Discharge plan-  Patient will likely expire in the hospital vs potential transfer to inpatient hospice unit in coming days depending on stability  - Goals of Care-  DNR/DNI with comfort focused care      Recommendations and Plan    Palliative care encounter Goals of care discussions - Life limiting illness: acute hypoxic respiratory failure - Proxy decision-maker (if needed): Per [place] hierarchy, would be patient's spouse [name]  Pain - Dilaudid 0.2-0.4mg  IV q30 minutes PRN for moderate to severe pain OR dyspnea  Dyspnea - Dilaudid 0.2-0.4mg  IV q30 minutes PRN for moderate to severe pain OR dyspnea   Anxiety/restlessness - Versed  0.5-1mg  IV q30 minutes PRN  Terminal respiratory secretions - Glycopyrrolate 0.2mg  q4h PRN   NSTEMI HF with reduced EF, new diagnosis Acute hypoxic respiratory failure Pulmonary edema secondary to fluid overload - Echo 12/30/23 with EF 35-40%, moderate mitral valve regurgitation - pro-BNP 14,680 - troponin 342 - Transferred to [facility] for cardiology workup, but patient declined consultation with preference to focus on comfort  Chronic medical problems contributing to the patient's medical complexity and may influence medical decision making - 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      Physical Examination  Heart Rate:  [97] 97 Resp:  [11] 11 BP: (59-108)/(33-53) 108/53 SpO2:  [89 %] 89 % 24 Hour Pain Score (last day)     Date/Time Pain Score   12/30/23 2300 Six   12/31/23 0131 Ten   12/31/23 0231 Zero   12/31/23 0433 Zero         Last Bowel Movement: No data recorded Wt Readings from  Last 5 Encounters:  12/30/23 65.8 kg (145 lb)  05/09/15 70.3 kg (155 lb)  01/15/15 73.3 kg (161 lb 9.6 oz)  01/02/15 74 kg (163 lb 3.2 oz)  06/20/14 71.2 kg (157 lb)    General: lying in bed and no apparent distress HEENT: mucous membranes moist and nasal cannula in place Cardiac: regular rate and rhythm and normal heart sounds Pulmonary: anterior lung fields clear on auscultation and non-labored breathing GI: soft, non-distended, and non-tender to palpation Skin: exposed skin with no rash or discoloration Extremities: warm and well perfused Neuro: no eye opening, not interactive, does not track or regard, no verbal responses, does not follow commands, and no psychomotor agitation      Advance Care Wishes and Domains of Care   -Proxy Health Care Decision Maker (if needed): Per [place] hierarchy, would be patient's spouse [name] -Advance Directives: none on file -Life limiting illness:  acute hypoxic respiratory failure -Estimated Prognosis: hours to days -Code Status:  DNR/DNI comfort   Domains of Care: -Values:   -Psycho/Social: large supportive family, enjoyed spending time with family -Spiritual: [employer] per demographics -Cultural:  -Ethical/Legal: none   Electronically signed: Charmaine Retort, NP 01/27/2024 / 8:28 AM

## 2024-01-16 DEATH — deceased

## 2024-01-24 ENCOUNTER — Inpatient Hospital Stay

## 2024-03-06 ENCOUNTER — Ambulatory Visit: Admitting: Physician Assistant

## 2024-03-09 NOTE — Progress Notes (Signed)
 Lab orders entered on this encounter.

## 2024-03-12 ENCOUNTER — Inpatient Hospital Stay

## 2024-03-12 ENCOUNTER — Inpatient Hospital Stay: Admitting: Physician Assistant
